# Patient Record
Sex: Female | Born: 1935 | Race: White | Hispanic: No | Smoking: Never smoker
Health system: Southern US, Community
[De-identification: ages and names within clinical notes are randomized; demographics above are authoritative.]

## PROBLEM LIST (undated history)

## (undated) DIAGNOSIS — N052 Unspecified nephritic syndrome with diffuse membranous glomerulonephritis: Secondary | ICD-10-CM

## (undated) DIAGNOSIS — M329 Systemic lupus erythematosus, unspecified: Secondary | ICD-10-CM

## (undated) DIAGNOSIS — K219 Gastro-esophageal reflux disease without esophagitis: Secondary | ICD-10-CM

## (undated) DIAGNOSIS — R1032 Left lower quadrant pain: Secondary | ICD-10-CM

## (undated) DIAGNOSIS — D279 Benign neoplasm of unspecified ovary: Secondary | ICD-10-CM

## (undated) DIAGNOSIS — R51 Headache: Secondary | ICD-10-CM

## (undated) DIAGNOSIS — G9341 Metabolic encephalopathy: Secondary | ICD-10-CM

## (undated) DIAGNOSIS — N736 Female pelvic peritoneal adhesions (postinfective): Secondary | ICD-10-CM

## (undated) DIAGNOSIS — G309 Alzheimer's disease, unspecified: Secondary | ICD-10-CM

## (undated) DIAGNOSIS — M3500 Sicca syndrome, unspecified: Secondary | ICD-10-CM

## (undated) DIAGNOSIS — N7011 Chronic salpingitis: Secondary | ICD-10-CM

## (undated) DIAGNOSIS — E7800 Pure hypercholesterolemia, unspecified: Secondary | ICD-10-CM

## (undated) DIAGNOSIS — K5909 Other constipation: Secondary | ICD-10-CM

## (undated) DIAGNOSIS — IMO0002 Reserved for concepts with insufficient information to code with codable children: Secondary | ICD-10-CM

## (undated) DIAGNOSIS — I639 Cerebral infarction, unspecified: Secondary | ICD-10-CM

## (undated) DIAGNOSIS — G8929 Other chronic pain: Secondary | ICD-10-CM

## (undated) DIAGNOSIS — I10 Essential (primary) hypertension: Secondary | ICD-10-CM

## (undated) HISTORY — DX: Unspecified nephritic syndrome with diffuse membranous glomerulonephritis: N05.2

## (undated) HISTORY — PX: FRACTURE SURGERY: SHX138

## (undated) HISTORY — DX: Other chronic pain: G89.29

## (undated) HISTORY — DX: Sjogren syndrome, unspecified: M35.00

## (undated) HISTORY — DX: Essential (primary) hypertension: I10

## (undated) HISTORY — DX: Chronic salpingitis: N70.11

## (undated) HISTORY — DX: Headache: R51

## (undated) HISTORY — DX: Other constipation: K59.09

## (undated) HISTORY — DX: Pure hypercholesterolemia, unspecified: E78.00

## (undated) HISTORY — DX: Benign neoplasm of unspecified ovary: D27.9

## (undated) HISTORY — DX: Left lower quadrant pain: R10.32

## (undated) HISTORY — DX: Female pelvic peritoneal adhesions (postinfective): N73.6

## (undated) HISTORY — DX: Gastro-esophageal reflux disease without esophagitis: K21.9

## (undated) HISTORY — DX: Metabolic encephalopathy: G93.41

---
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
## 1979-04-29 HISTORY — PX: VAGINAL HYSTERECTOMY: SUR661

## 1999-04-20 ENCOUNTER — Emergency Department (HOSPITAL_COMMUNITY): Admission: EM | Admit: 1999-04-20 | Discharge: 1999-04-20 | Payer: Self-pay | Admitting: Emergency Medicine

## 1999-04-20 ENCOUNTER — Encounter: Payer: Self-pay | Admitting: Emergency Medicine

## 1999-05-17 ENCOUNTER — Ambulatory Visit (HOSPITAL_COMMUNITY): Admission: RE | Admit: 1999-05-17 | Discharge: 1999-05-17 | Payer: Self-pay | Admitting: Neurology

## 2000-04-28 HISTORY — PX: APPENDECTOMY: SHX54

## 2000-04-28 HISTORY — PX: LAPAROSCOPIC CHOLECYSTECTOMY: SUR755

## 2003-04-29 HISTORY — PX: TOTAL ABDOMINAL HYSTERECTOMY W/ BILATERAL SALPINGOOPHORECTOMY: SHX83

## 2006-12-09 ENCOUNTER — Encounter: Payer: Self-pay | Admitting: Cardiology

## 2007-06-24 ENCOUNTER — Encounter: Payer: Self-pay | Admitting: Cardiology

## 2008-01-26 ENCOUNTER — Encounter: Payer: Self-pay | Admitting: Cardiology

## 2008-03-22 ENCOUNTER — Encounter: Payer: Self-pay | Admitting: Cardiology

## 2008-04-18 ENCOUNTER — Ambulatory Visit: Payer: Self-pay | Admitting: Cardiology

## 2008-04-26 ENCOUNTER — Ambulatory Visit: Payer: Self-pay | Admitting: Cardiology

## 2008-04-26 ENCOUNTER — Encounter: Payer: Self-pay | Admitting: Cardiology

## 2008-04-26 DIAGNOSIS — H1030 Unspecified acute conjunctivitis, unspecified eye: Secondary | ICD-10-CM | POA: Insufficient documentation

## 2008-04-26 DIAGNOSIS — R9430 Abnormal result of cardiovascular function study, unspecified: Secondary | ICD-10-CM | POA: Insufficient documentation

## 2008-04-26 DIAGNOSIS — M3500 Sicca syndrome, unspecified: Secondary | ICD-10-CM | POA: Insufficient documentation

## 2008-04-26 DIAGNOSIS — R109 Unspecified abdominal pain: Secondary | ICD-10-CM | POA: Insufficient documentation

## 2008-04-26 DIAGNOSIS — N052 Unspecified nephritic syndrome with diffuse membranous glomerulonephritis: Secondary | ICD-10-CM | POA: Insufficient documentation

## 2008-05-26 ENCOUNTER — Ambulatory Visit: Payer: Self-pay | Admitting: Cardiology

## 2008-06-30 ENCOUNTER — Encounter: Payer: Self-pay | Admitting: Cardiology

## 2009-01-11 ENCOUNTER — Ambulatory Visit: Payer: Self-pay | Admitting: Cardiology

## 2009-09-19 ENCOUNTER — Ambulatory Visit: Payer: Self-pay | Admitting: Cardiology

## 2010-02-26 ENCOUNTER — Encounter: Payer: Self-pay | Admitting: Physician Assistant

## 2010-03-05 ENCOUNTER — Encounter: Payer: Self-pay | Admitting: Physician Assistant

## 2010-03-18 ENCOUNTER — Ambulatory Visit: Payer: Self-pay | Admitting: Physician Assistant

## 2010-03-18 DIAGNOSIS — R079 Chest pain, unspecified: Secondary | ICD-10-CM

## 2010-03-26 ENCOUNTER — Ambulatory Visit: Payer: Self-pay | Admitting: Cardiology

## 2010-03-26 ENCOUNTER — Encounter: Payer: Self-pay | Admitting: Physician Assistant

## 2010-03-29 ENCOUNTER — Encounter (INDEPENDENT_AMBULATORY_CARE_PROVIDER_SITE_OTHER): Payer: Self-pay | Admitting: *Deleted

## 2010-04-19 ENCOUNTER — Encounter: Payer: Self-pay | Admitting: Cardiology

## 2010-04-19 ENCOUNTER — Ambulatory Visit: Payer: Self-pay | Admitting: Cardiology

## 2010-05-28 NOTE — Assessment & Plan Note (Signed)
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 She stable from a renal perspective. She reportedly had some lower extremity edema after started on proper meal which has been discontinued. The patient has been given hydrochlorothiazide by Dr. Neita Carp in her blood pressure is well controlled. She does show me some cystic lesions, fluid-filled at the level of both ankles at the outer aspect. Particularly present in the left ankle  The patient also stress test 2 years ago with positive ST segment changes but no echocardiographic imaging changes. The patient has done well from a cardiac standpoint she denies any chest pain she does have old shortness of breath on exertion but she attributes this to deconditioning. She does feel tired and fatigued most days but it does appear that she goes quite late to bed and has an interrupted sleep pattern. However she has no symptoms of heart failure or ischemia.  Preventive Screening-Counseling & Management  Alcohol-Tobacco     Smoking Status: never  Current Medications (verified): 1)  Vitamin B-12 1000 Mcg Tabs (Cyanocobalamin) .... Take 1 Tablet By Mouth Once A Day 2)  Vitamin D3 2000 Unit Caps (Cholecalciferol) .... Take 1 Capsule By Mouth Once A Day 3)  Miralax  Powd (Polyethylene Glycol 3350) .Marland KitchenMarland KitchenMarland Kitchen 17 Grams Daily 4)  Red Yeast Rice 600 Mg Caps (Red Yeast Rice Extract) .... Take 1 Capsule By Mouth Once A Day 5)  Fish Oil Double Strength 1200 Mg Caps (Omega-3 Fatty Acids) .... Take 2 Capsule By Mouth Once A Day (Occasionally Doesn't Take) 6)  Aspirin 81 Mg Tbec (Aspirin) .... Take One Tablet By Mouth Daily 7)  Nitrostat 0.4 Mg Subl (Nitroglycerin) .... Take As Directed 8)  Vitamin C 500 Mg Tabs (Ascorbic Acid) .... Take 1 Tablet By Mouth Once A Day 9)  Centrum Silver   Tabs (Multiple Vitamins-Minerals) .... Take 1 Tablet By Mouth Once A Day 10)  Biotin 5000 5 Mg Caps (Biotin) .... Take 1 Tablet By Mouth Once A Day 11)  Restasis 0.05 % Emul (Cyclosporine) .... Two Times A Day 12)  Eq Effervescent Cold Plus 2-5-250 Mg Tbef (Chlorphen-Phenyleph-Apap) .... As Needed 13)  Zinc 50 Mg Tabs (Zinc) .... Take 1 Tablet By Mouth Once A Day 14)  L-Lysine 1000 Mg Tabs (Lysine) .... Take 1 Tablet By Mouth Once A Day 15)  Omeprazole 20 Mg Cpdr (Omeprazole) .... Take 1 Tablet By Mouth Once A Day 16)  Hydrochlorothiazide 25 Mg Tabs (Hydrochlorothiazide) .... Take 1 Tablet By Mouth Once A Day  Allergies (verified): 1)  ! * Cellcept 2)  Penicillin 3)  Steroids 4)  Levaquin 5)  Sulfa  Comments:  Nurse/Medical Assistant: The patient's medications and allergies were reviewed with the patient and were updated in the Medication and Allergy Lists. List reviewed.   Past History:  Past Medical History: Last updated: 04/26/2008 chronic left lower quadrant pain Left hydrosalpinx Right ovarian fibroma Pelvic adhesions Chronic constipation Membranous glomerulonephritisfollowed by Dr. Clelia Schaumann Sjogren syndrome  Past Surgical History: Last updated: 04/25/2008 bilateral salpingo-oophorectomy in 2005 Appendectomy 2002 Laparoscopic cholecystectomy 2002 Tonsillectomy in 1941 Vaginal hysterectomy 1981  Family History: Last updated: 04/25/2008 Heart disease, anxiety and depression.  Social History: Last updated: 04/26/2008 the patient denies any tobacco or alcohol use  Review of Systems       The patient  complains of fatigue and weight gain/loss.  The patient denies malaise, fever, vision loss, decreased hearing, hoarseness, chest pain, palpitations, shortness of breath, prolonged cough, wheezing, sleep apnea, coughing up blood, abdominal pain, blood in stool, nausea, vomiting, diarrhea, heartburn, incontinence, blood in urine, muscle weakness, joint pain, leg  swelling, rash, skin lesions, headache, fainting, dizziness, depression, anxiety, enlarged lymph nodes, easy bruising or bleeding, and environmental allergies.    Vital Signs:  Patient profile:   77 year old female Height:      62 inches Weight:      148 pounds Pulse rate:   63 / minute BP sitting:   115 / 74  (left arm) Cuff size:   regular  Vitals Entered By: Carlye Grippe (Sep 19, 2009 9:10 AM)  Physical Exam  Additional Exam:  General: Well-developed, well-nourished in no distress head: Normocephalic and atraumatic eyes PERRLA/EOMI intact, conjunctiva and lids normal nose: No deformity or lesions mouth normal dentition, normal posterior pharynx neck: Supple, no JVD.  No masses, thyromegaly or abnormal cervical nodes lungs: Normal breath sounds bilaterally without wheezing.  Normal percussion heart: regular rate and rhythm with normal S1 and S2, no S3 or S4.  PMI is normal.  No pathological murmurs abdomen: Normal bowel sounds, abdomen is soft and nontender without masses, organomegaly or hernias noted.  No hepatosplenomegaly musculoskeletal: Back normal, normal gait muscle strength and tone normal pulsus: Pulse is normal in all 4 extremities Extremities: No peripheral pitting edema, small cystic lesions on the outer aspect of both ankles neurologic: Alert and oriented x 3 skin: Intact without lesions or rashes cervical nodes: No significant adenopathy psychologic: Normal affect    Impression & Recommendations:  Problem # 1:  MEMBRANOUS GLOMERULONEPHRITIS (ICD-583.1) in remission  Problem # 2:  NONSPECIFIC ABNORMAL UNSPEC CV FUNCTION STUDY (ICD-794.30) negative echocardiographic images. No clear evidence that the patient has significant ischemic heart disease and no further testing required currently. Continue risk factor modification  Problem # 3:  SJOGREN'S SYNDROME (ICD-710.2) Assessment: Comment Only  Patient Instructions: 1)  Your physician recommends that you  continue on your current medications as directed. Please refer to the Current Medication list given to you today. 2)  Follow up in  1 year. Prescriptions: NITROSTAT 0.4 MG SUBL (NITROGLYCERIN) dissolve one tablet under tongue for severe chest pain as needed every 5 minutes, not to exceed 3 in 15 min time frame  #25 x 2   Entered by:   Hoover Brunette, LPN   Authorized by:   Lewayne Bunting, MD, [facility]   Signed by:   Hoover Brunette, LPN on 84/69/6295   Method used:   Electronically to        [facility] (retail)       [address]       [place], [place]  [address]       Ph: [phone]       Fax: [phone]   RxID:   [id number]

## 2010-05-28 NOTE — Letter (Signed)
Summary: Engineer, materials at [facility]  [address]   [place], [place] [address]   Phone: [phone]  Fax: [phone]        March 29, 2010 MRN: [id number]   LAIYA WISBY [address] [place], [place]  [address]   Dear Ms. Val Eagle,  Your test ordered by Selena Batten has been reviewed by your physician (or physician assistant) and was found to be normal or stable. Your physician (or physician assistant) felt no changes were needed at this time.  _X___ Echocardiogram  ____ Cardiac Stress Test  ____ Lab Work  ____ Peripheral vascular study of arms, legs or neck  ____ CT scan or X-ray  ____ Lung or Breathing test  ____ Other:   Thank you.   Hoover Brunette, LPN    Duane Boston, M.D., F.A.C.C. Thressa Sheller, M.D., F.A.C.C. Oneal Grout, M.D., F.A.C.C. Cheree Ditto, M.D., F.A.C.C. Daiva Nakayama, M.D., F.A.C.C. Kenney Houseman, M.D., F.A.C.C. Jeanne Ivan, PA-C

## 2010-05-28 NOTE — Letter (Signed)
Summary: External Correspondence/ [facility] DR. Neita Carp  External Correspondence/ [facility] DR. Neita Carp   Imported By: Dorise Hiss 03/07/2010 15:24:52  _____________________________________________________________________  External Attachment:    Type:   Image     Comment:   External Document

## 2010-05-28 NOTE — Assessment & Plan Note (Signed)
Summary: ROV- RECURRENT CP PER SASSER REQUEST   Visit Type:  Follow-up Primary Provider:  Dr Neita Carp   History of Present Illness: patient presents for evaluation of chest pain, per Dr. Neita Carp.   Patient has no documented history of CAD. She had a probable false positive exercise echocardiogram, 12/09, notable for positive ST segment changes, but no echocardiographic imaging changes. EF 55-60%. She was last seen here in the clinic, by Dr. Andee Lineman, in May of this year.  Patient denies any interim development of exertional angina pectoris, or significant DOE. Her chest pain occurs only when lying supine and taking a deep breath. It was particularly bad 2 weeks ago, but absence of associated fever, chills, or cough. Symptoms have abated, but still persist, again with deep inspiration.  A recent 12-lead EKG in Dr. Dian Situ office indicated no acute changes. This was also compared to her last study here in our office, 9/10, again with no significant changes.  Preventive Screening-Counseling & Management  Alcohol-Tobacco     Smoking Status: never  Current Medications (verified): 1)  Vitamin B-12 1000 Mcg Tabs (Cyanocobalamin) .... Take 1 Tablet By Mouth Once A Day 2)  Vitamin D3 2000 Unit Caps (Cholecalciferol) .... Take 1 Capsule By Mouth Once A Day 3)  Miralax  Powd (Polyethylene Glycol 3350) .Marland KitchenMarland KitchenMarland Kitchen 17 Grams Daily 4)  Red Yeast Rice 600 Mg Caps (Red Yeast Rice Extract) .... Take 1 Capsule By Mouth Once A Day 5)  Fish Oil Double Strength 1200 Mg Caps (Omega-3 Fatty Acids) .... Take 2 Capsule By Mouth Once A Day (Occasionally Doesn't Take) 6)  Aspirin 81 Mg Tbec (Aspirin) .... Take One Tablet By Mouth Daily 7)  Nitrostat 0.4 Mg Subl (Nitroglycerin) .... Dissolve One Tablet Under Tongue For Severe Chest Pain As Needed Every 5 Minutes, Not To Exceed 3 in 15 Min Time Frame 8)  Vitamin C 500 Mg Tabs (Ascorbic Acid) .... Take 1 Tablet By Mouth Once A Day 9)  Centrum Silver  Tabs (Multiple  Vitamins-Minerals) .... Take 1 Tablet By Mouth Once A Day 10)  Biotin 5000 5 Mg Caps (Biotin) .... Take 1 Tablet By Mouth Once A Day 11)  Restasis 0.05 % Emul (Cyclosporine) .... Two Times A Day 12)  Eq Effervescent Cold Plus 2-5-250 Mg Tbef (Chlorphen-Phenyleph-Apap) .... As Needed 13)  Zinc 50 Mg Tabs (Zinc) .... Take 1 Tablet By Mouth Once A Day 14)  L-Lysine 1000 Mg Tabs (Lysine) .... Take 1 Tablet By Mouth Once A Day 15)  Pantoprazole Sodium 40 Mg Tbec (Pantoprazole Sodium) .... Take 1 Tablet By Mouth Once A Day 16)  Hydrochlorothiazide 25 Mg Tabs (Hydrochlorothiazide) .... Take 1 Tablet By Mouth Once A Day  Allergies (verified): 1)  ! * Cellcept 2)  Penicillin 3)  Steroids 4)  Levaquin 5)  Sulfa  Comments:  Nurse/Medical Assistant: The patient's medication list and allergies were reviewed with the patient and were updated in the Medication and Allergy Lists.  Past History:  Past Medical History: Last updated: 04/26/2008 chronic left lower quadrant pain Left hydrosalpinx Right ovarian fibroma Pelvic adhesions Chronic constipation Membranous glomerulonephritisfollowed by Dr. Clelia Schaumann Sjogren syndrome  Review of Systems       No fevers, chills, hemoptysis, dysphagia, melena, hematocheezia, hematuria, rash, claudication, orthopnea, pnd, pedal edema. All other systems negative.   Vital Signs:  Patient profile:   77 year old female Height:      62 inches Weight:      143 pounds BMI:  26.25 O2 Sat:      96 % on Room air Pulse rate:   61 / minute BP sitting:   116 / 76  (left arm) Cuff size:   regular  Vitals Entered By: Carlye Grippe (March 18, 2010 1:15 PM)  Nutrition Counseling: Patient's BMI is greater than 25 and therefore counseled on weight management options.  O2 Flow:  Room air   Physical Exam  Additional Exam:  GEN: 77 year old female, sitting upright, in no distress HEENT: NCAT,PERRLA,EOMI NECK: palpable pulses, no bruits; no JVD; no  TM LUNGS: CTA bilaterally HEART: RRR (S1S2); no significant murmurs; no rubs; no gallops ABD: soft, NT; intact BS EXT: intact distal pulses; no edema SKIN: warm, dry MUSC: no obvious deformity NEURO: A/O (x3)     Impression & Recommendations:  Problem # 1:  CHEST PAIN (ICD-786.50)  patient's symptoms are pleuritic in nature, and not suggestive of underlying ischemic heart disease. Therefore, do not recommend a repeat stress test, particularly in light of a previous probable false positive stress echocardiogram in 2009. However, we'll order a 2-D echocardiogram to rule out structural abnormalities, or possible pericardial effusion. If this is negative, then her etiology is most likely musculoskeletal. We'll plan early return clinic follow up, with Dr. Andee Lineman, for review of echo results, and further recommendations.  Problem # 2:  NONSPECIFIC ABNORMAL UNSPEC CV FUNCTION STUDY (ICD-794.30) Assessment: Comment Only  Problem # 3:  SJOGREN'S SYNDROME (ICD-710.2) Assessment: Comment Only  Other Orders: 2-D Echocardiogram (2D Echo)  Patient Instructions: 1)  Follow up with Dr. Earnestine Leys on Friday, April 19, 2010 at 10:30am. 2)  Your physician has requested that you have an echocardiogram.  Echocardiography is a painless test that uses sound waves to create images of your heart. It provides your doctor with information about the size and shape of your heart and how well your heart's chambers and valves are working.  This procedure takes approximately one hour. There are no restrictions for this procedure. 3)  Your physician recommends that you continue on your current medications as directed. Please refer to the Current Medication list given to you today.

## 2010-05-30 NOTE — Assessment & Plan Note (Signed)
Summary: 1 MO F/U PER 11/21 OV W/GENE-JM   Visit Type:  Follow-up Primary Provider:  Dr Neita Carp   History of Present Illness: the patient is a 77 year old female with no documented history of coronary artery disease.  She had a probable false-positive exercise echocardiogram with ST segment changes but normal echocardiographic images.  Ejection fraction 55 to 60%.  The patient also has a history glomerulonephritis and has been followed in [facility].  This has been stable.  Chest recent blood work done with a normal creatinine with an elevated calcium level.  Her level was 10.8-mg percent.  She also had a recent echocardiogram done on November 2011 which was essentially within normal limits.  The patient does report occasional chest tightness but no exertional chest pain.  She denies any orthopnea PND palpitations of syncope.  Preventive Screening-Counseling & Management  Alcohol-Tobacco     Smoking Status: never  Current Medications (verified): 1)  Vitamin B-12 1000 Mcg Tabs (Cyanocobalamin) .... Take 1 Tablet By Mouth Once A Day 2)  Vitamin D3 2000 Unit Caps (Cholecalciferol) .... Take 1 Capsule By Mouth Once A Day 3)  Miralax  Powd (Polyethylene Glycol 3350) .Marland KitchenMarland KitchenMarland Kitchen 17 Grams Daily 4)  Red Yeast Rice 600 Mg Caps (Red Yeast Rice Extract) .... Take 1 Capsule By Mouth Once A Day 5)  Fish Oil Double Strength 1200 Mg Caps (Omega-3 Fatty Acids) .... Take 2 Capsule By Mouth Once A Day (Occasionally Doesn't Take) 6)  Aspirin 81 Mg Tbec (Aspirin) .... Take One Tablet By Mouth Daily 7)  Nitrostat 0.4 Mg Subl (Nitroglycerin) .... Dissolve One Tablet Under Tongue For Severe Chest Pain As Needed Every 5 Minutes, Not To Exceed 3 in 15 Min Time Frame 8)  Centrum Silver  Tabs (Multiple Vitamins-Minerals) .... Take 1 Tablet By Mouth Once A Day 9)  Biotin 5000 5 Mg Caps (Biotin) .... Take 1 Tablet By Mouth Once A Day 10)  Restasis 0.05 % Emul (Cyclosporine) .... Two Times A Day 11)  Eq Effervescent Cold Plus  2-5-250 Mg Tbef (Chlorphen-Phenyleph-Apap) .... As Needed 12)  Zinc 50 Mg Tabs (Zinc) .... Take 1 Tablet By Mouth Once A Day 13)  L-Lysine 1000 Mg Tabs (Lysine) .... Take 1 Tablet By Mouth Once A Day 14)  Pantoprazole Sodium 40 Mg Tbec (Pantoprazole Sodium) .... Take 1 Tablet By Mouth Once A Day 15)  Hydrochlorothiazide 25 Mg Tabs (Hydrochlorothiazide) .... Take 1 Tablet By Mouth Once A Day  Allergies: 1)  ! * Cellcept 2)  Penicillin 3)  Steroids 4)  Levaquin 5)  Sulfa  Comments:  Nurse/Medical Assistant: The patient's medications and allergies were reviewed with the patient and were updated in the Medication and Allergy Lists. Pt brought a list of medications to office visit.  Cyril Loosen, RN, BSN (April 19, 2010 10:47 AM)  Past History:  Past Medical History: Last updated: 04/26/2008 chronic left lower quadrant pain Left hydrosalpinx Right ovarian fibroma Pelvic adhesions Chronic constipation Membranous glomerulonephritisfollowed by Dr. Clelia Schaumann Sjogren syndrome  Past Surgical History: Last updated: 04/25/2008 bilateral salpingo-oophorectomy in 2005 Appendectomy 2002 Laparoscopic cholecystectomy 2002 Tonsillectomy in 1941 Vaginal hysterectomy 1981  Family History: Last updated: 04/25/2008 Heart disease, anxiety and depression.  Social History: Last updated: 04/26/2008 the patient denies any tobacco or alcohol use  Risk Factors: Smoking Status: never (04/19/2010)  Review of Systems  The patient denies fatigue, malaise, fever, weight gain/loss, vision loss, decreased hearing, hoarseness, chest pain, palpitations, shortness of breath, prolonged cough, wheezing, sleep apnea, coughing up  blood, abdominal pain, blood in stool, nausea, vomiting, diarrhea, heartburn, incontinence, blood in urine, muscle weakness, joint pain, leg swelling, rash, skin lesions, headache, fainting, dizziness, depression, anxiety, enlarged lymph nodes, easy bruising or bleeding, and  environmental allergies.    Vital Signs:  Patient profile:   77 year old female Height:      62 inches Weight:      143.25 pounds Pulse rate:   64 / minute BP sitting:   141 / 86  (left arm) Cuff size:   regular  Vitals Entered By: Cyril Loosen, RN, BSN (April 19, 2010 10:44 AM) Comments Follow up office visit   Physical Exam  Additional Exam:  GEN: 77 year old female, sitting upright, in no distress HEENT: NCAT,PERRLA,EOMI NECK: palpable pulses, no bruits; no JVD; no TM LUNGS: CTA bilaterally HEART: RRR (S1S2); no significant murmurs; no rubs; no gallops ABD: soft, NT; intact BS EXT: intact distal pulses; no edema SKIN: warm, dry MUSC: no obvious deformity NEURO: A/O (x3)     Impression & Recommendations:  Problem # 1:  MEMBRANOUS GLOMERULONEPHRITIS (ICD-583.1) followed at [facility] in [place] creatinine stable  Problem # 2:  CHEST PAIN (ICD-786.50) atypical chest pain.  Status-post stress echocardiogram however if the patient has ongoing chest pain the Cardiolite study should be considered. Her updated medication list for this problem includes:    Aspirin 81 Mg Tbec (Aspirin) .[name]... Take one tablet by mouth daily    Nitrostat 0.4 Mg Subl (Nitroglycerin) .[name]... Dissolve one tablet under tongue for severe chest pain as needed every 5 minutes, not to exceed 3 in 15 min time frame  Orders: T-Calcium  ([id number]) T- * Misc. Laboratory test [phone])  Problem # 3:  HYPERCALCEMIA (ICD-275.42) will obtain a PTH level as well as repeat calcium level Orders: T-Calcium  ([id number]) T- * Misc. Laboratory test [phone])  Patient Instructions: 1)  Labs:  Calcium, PTH level 2)  Follow up in  6 months Prescriptions: NITROSTAT 0.4 MG SUBL (NITROGLYCERIN) dissolve one tablet under tongue for severe chest pain as needed every 5 minutes, not to exceed 3 in 15 min time frame  #25 x 2   Entered by:   Hoover Brunette, LPN   Authorized by:   Lewayne Bunting, MD, [facility]   Signed by:    Hoover Brunette, LPN on 57/84/6962   Method used:   Electronically to        [facility] (retail)       [address]       [place], [place]  [address]       Ph: [phone]       Fax: [phone]   RxID:   [id number]

## 2010-11-28 ENCOUNTER — Encounter: Payer: Self-pay | Admitting: Cardiology

## 2010-12-13 ENCOUNTER — Encounter: Payer: Self-pay | Admitting: Cardiology

## 2010-12-13 ENCOUNTER — Ambulatory Visit (INDEPENDENT_AMBULATORY_CARE_PROVIDER_SITE_OTHER): Payer: Medicare Other | Admitting: Cardiology

## 2010-12-13 VITALS — BP 114/72 | HR 71 | Ht 62.0 in | Wt 137.0 lb

## 2010-12-13 DIAGNOSIS — R9430 Abnormal result of cardiovascular function study, unspecified: Secondary | ICD-10-CM

## 2010-12-13 DIAGNOSIS — N052 Unspecified nephritic syndrome with diffuse membranous glomerulonephritis: Secondary | ICD-10-CM

## 2010-12-13 DIAGNOSIS — R072 Precordial pain: Secondary | ICD-10-CM

## 2010-12-13 NOTE — Patient Instructions (Signed)
   D-Dimer - at hospital now  Please call office around 4:30 for results.  Your physician wants you to follow up in:  1 year.  You will receive a reminder letter in the mail one-two months in advance.  If you don't receive a letter, please call our office to schedule the follow up appointment

## 2010-12-13 NOTE — Progress Notes (Signed)
HPI   Allergies  Allergen Reactions  . Levofloxacin     REACTION: rash  . Medrol (Methylprednisolone)   . Mycophenolate Mofetil   . Penicillins     REACTION: itching  . Sulfonamide Derivatives     REACTION: nausea  . Verapamil     Felt shakey    Current Outpatient Prescriptions on File Prior to Visit  Medication Sig Dispense Refill  . cycloSPORINE (RESTASIS) 0.05 % ophthalmic emulsion 1 drop 2 (two) times daily.        . hydrochlorothiazide 25 MG tablet Take 25 mg by mouth daily.        . Multiple Vitamins-Minerals (CENTRUM SILVER PO) Take 1 tablet by mouth daily.        . nitroGLYCERIN (NITROSTAT) 0.4 MG SL tablet Place 0.4 mg under the tongue every 5 (five) minutes as needed.        . pantoprazole (PROTONIX) 40 MG tablet Take 40 mg by mouth daily.        . polyethylene glycol powder (MIRALAX) powder Take 17 g by mouth daily.        . vitamin B-12 (CYANOCOBALAMIN) 1000 MCG tablet Take 1,000 mcg by mouth daily.        . Zinc 50 MG TABS Take 1 tablet by mouth daily.          Past Medical History  Diagnosis Date  . Chronic left lower quadrant pain   . Hydrosalpinx     LEFT  . Ovarian fibroma     RIGHT  . Pelvic adhesions   . Chronic constipation   . Membranous glomerulonephritis     followed by Dr. Clelia Schaumann  . Sjogren's syndrome     Past Surgical History  Procedure Date  . Total abdominal hysterectomy w/ bilateral salpingoophorectomy 2005  . Appendectomy 2002  . Laparoscopic cholecystectomy 2002  . Tonsillectomy 1941  . Vaginal hysterectomy 1981    Family History  Problem Relation Age of Onset  . Heart disease    . Anxiety disorder    . Depression      History   Social History  . Marital Status: Widowed    Spouse Name: N/A    Number of Children: N/A  . Years of Education: N/A   Occupational History  . Not on file.   Social History Main Topics  . Smoking status: Never Smoker   . Smokeless tobacco: Never Used  . Alcohol Use: No  . Drug Use: Not  on file  . Sexually Active: Not on file   Other Topics Concern  . Not on file   Social History Narrative  . No narrative on file   [id number] positives as outlined above. The remainder of the 18  point review of systems is negative  PHYSICAL EXAM BP 114/72  Pulse 71  Ht 5\' 2"  (1.575 m)  Wt 137 lb (62.143 kg)  BMI 25.06 kg/m2  General: Well-developed, well-nourished in no distress Head: Normocephalic and atraumatic Eyes:PERRLA/EOMI intact, conjunctiva and lids normal Ears: No deformity or lesions Mouth:normal dentition, normal posterior pharynx Neck: Supple, no JVD.  No masses, thyromegaly or abnormal cervical nodes Lungs: Normal breath sounds bilaterally without wheezing.  Normal percussion Cardiac: regular rate and rhythm with normal S1 and S2, no S3 or S4.  PMI is normal.  No pathological murmurs Abdomen: Normal bowel sounds, abdomen is soft and nontender without masses, organomegaly or hernias noted.  No hepatosplenomegaly MSK: Back normal, normal gait muscle strength and tone normal Vascular: Pulse  is normal in all 4 extremities Extremities: No peripheral pitting edema Neurologic: Alert and oriented x 3 Skin: Intact without lesions or rashes Lymphatics: No significant adenopathy Psychologic: Normal affect   ECG: Not available  ASSESSMENT AND PLAN

## 2010-12-13 NOTE — Assessment & Plan Note (Signed)
Chest pain is atypical with some pleuritic component but appears to be more likely related to the chest cavity, and possibly costochondritis. However despite the low pretest probability for pulmonary emboli I've referred the patient for a stat d-dimer level to make sure that she has no pulmonary emboli No further ischemia evaluation currently indicated.

## 2010-12-13 NOTE — Assessment & Plan Note (Signed)
Resolved on no medical therapy.

## 2010-12-13 NOTE — Assessment & Plan Note (Signed)
Followed by primary care physician. PTH levels previously ordered

## 2011-05-05 DIAGNOSIS — D485 Neoplasm of uncertain behavior of skin: Secondary | ICD-10-CM | POA: Diagnosis not present

## 2011-05-08 DIAGNOSIS — I10 Essential (primary) hypertension: Secondary | ICD-10-CM | POA: Diagnosis not present

## 2011-05-08 DIAGNOSIS — E7800 Pure hypercholesterolemia, unspecified: Secondary | ICD-10-CM | POA: Diagnosis not present

## 2011-05-28 DIAGNOSIS — K5909 Other constipation: Secondary | ICD-10-CM | POA: Diagnosis not present

## 2011-05-28 DIAGNOSIS — J309 Allergic rhinitis, unspecified: Secondary | ICD-10-CM | POA: Diagnosis not present

## 2011-05-28 DIAGNOSIS — M545 Low back pain: Secondary | ICD-10-CM | POA: Diagnosis not present

## 2011-05-28 DIAGNOSIS — E7800 Pure hypercholesterolemia, unspecified: Secondary | ICD-10-CM | POA: Diagnosis not present

## 2011-05-28 DIAGNOSIS — E213 Hyperparathyroidism, unspecified: Secondary | ICD-10-CM | POA: Diagnosis not present

## 2011-05-28 DIAGNOSIS — I10 Essential (primary) hypertension: Secondary | ICD-10-CM | POA: Diagnosis not present

## 2011-08-17 DIAGNOSIS — S61209A Unspecified open wound of unspecified finger without damage to nail, initial encounter: Secondary | ICD-10-CM | POA: Diagnosis not present

## 2011-08-17 DIAGNOSIS — I10 Essential (primary) hypertension: Secondary | ICD-10-CM | POA: Diagnosis not present

## 2011-08-28 DIAGNOSIS — Z4802 Encounter for removal of sutures: Secondary | ICD-10-CM | POA: Diagnosis not present

## 2011-09-01 DIAGNOSIS — Z4802 Encounter for removal of sutures: Secondary | ICD-10-CM | POA: Diagnosis not present

## 2011-09-02 DIAGNOSIS — I10 Essential (primary) hypertension: Secondary | ICD-10-CM | POA: Diagnosis not present

## 2011-09-02 DIAGNOSIS — Z4802 Encounter for removal of sutures: Secondary | ICD-10-CM | POA: Diagnosis not present

## 2011-09-02 DIAGNOSIS — Z09 Encounter for follow-up examination after completed treatment for conditions other than malignant neoplasm: Secondary | ICD-10-CM | POA: Diagnosis not present

## 2011-09-19 DIAGNOSIS — E213 Hyperparathyroidism, unspecified: Secondary | ICD-10-CM | POA: Diagnosis not present

## 2011-09-19 DIAGNOSIS — E7800 Pure hypercholesterolemia, unspecified: Secondary | ICD-10-CM | POA: Diagnosis not present

## 2011-09-19 DIAGNOSIS — M545 Low back pain: Secondary | ICD-10-CM | POA: Diagnosis not present

## 2011-09-25 DIAGNOSIS — M545 Low back pain: Secondary | ICD-10-CM | POA: Diagnosis not present

## 2011-09-25 DIAGNOSIS — J309 Allergic rhinitis, unspecified: Secondary | ICD-10-CM | POA: Diagnosis not present

## 2011-09-25 DIAGNOSIS — I10 Essential (primary) hypertension: Secondary | ICD-10-CM | POA: Diagnosis not present

## 2011-09-25 DIAGNOSIS — K5909 Other constipation: Secondary | ICD-10-CM | POA: Diagnosis not present

## 2011-09-25 DIAGNOSIS — E213 Hyperparathyroidism, unspecified: Secondary | ICD-10-CM | POA: Diagnosis not present

## 2011-09-25 DIAGNOSIS — E7800 Pure hypercholesterolemia, unspecified: Secondary | ICD-10-CM | POA: Diagnosis not present

## 2011-10-15 DIAGNOSIS — Z1231 Encounter for screening mammogram for malignant neoplasm of breast: Secondary | ICD-10-CM | POA: Diagnosis not present

## 2011-10-28 DIAGNOSIS — E7800 Pure hypercholesterolemia, unspecified: Secondary | ICD-10-CM | POA: Diagnosis not present

## 2011-10-28 DIAGNOSIS — I10 Essential (primary) hypertension: Secondary | ICD-10-CM | POA: Diagnosis not present

## 2011-11-06 DIAGNOSIS — S40029A Contusion of unspecified upper arm, initial encounter: Secondary | ICD-10-CM | POA: Diagnosis not present

## 2011-11-06 DIAGNOSIS — E213 Hyperparathyroidism, unspecified: Secondary | ICD-10-CM | POA: Diagnosis not present

## 2011-11-06 DIAGNOSIS — I10 Essential (primary) hypertension: Secondary | ICD-10-CM | POA: Diagnosis not present

## 2011-11-06 DIAGNOSIS — R1031 Right lower quadrant pain: Secondary | ICD-10-CM | POA: Diagnosis not present

## 2011-11-06 DIAGNOSIS — K5909 Other constipation: Secondary | ICD-10-CM | POA: Diagnosis not present

## 2011-11-06 DIAGNOSIS — E7800 Pure hypercholesterolemia, unspecified: Secondary | ICD-10-CM | POA: Diagnosis not present

## 2011-11-14 DIAGNOSIS — R1031 Right lower quadrant pain: Secondary | ICD-10-CM | POA: Diagnosis not present

## 2011-11-14 DIAGNOSIS — R109 Unspecified abdominal pain: Secondary | ICD-10-CM | POA: Diagnosis not present

## 2011-11-19 DIAGNOSIS — R229 Localized swelling, mass and lump, unspecified: Secondary | ICD-10-CM | POA: Diagnosis not present

## 2011-11-25 DIAGNOSIS — N6459 Other signs and symptoms in breast: Secondary | ICD-10-CM | POA: Diagnosis not present

## 2011-11-25 DIAGNOSIS — R222 Localized swelling, mass and lump, trunk: Secondary | ICD-10-CM | POA: Diagnosis not present

## 2011-12-16 ENCOUNTER — Encounter: Payer: Self-pay | Admitting: *Deleted

## 2011-12-16 ENCOUNTER — Other Ambulatory Visit: Payer: Self-pay | Admitting: Cardiology

## 2011-12-16 ENCOUNTER — Ambulatory Visit (INDEPENDENT_AMBULATORY_CARE_PROVIDER_SITE_OTHER): Payer: Medicare Other | Admitting: Cardiology

## 2011-12-16 ENCOUNTER — Encounter: Payer: Self-pay | Admitting: Cardiology

## 2011-12-16 VITALS — BP 118/77 | HR 53 | Ht 62.0 in | Wt 139.0 lb

## 2011-12-16 DIAGNOSIS — R9430 Abnormal result of cardiovascular function study, unspecified: Secondary | ICD-10-CM

## 2011-12-16 DIAGNOSIS — R079 Chest pain, unspecified: Secondary | ICD-10-CM | POA: Diagnosis not present

## 2011-12-16 DIAGNOSIS — R0602 Shortness of breath: Secondary | ICD-10-CM

## 2011-12-16 DIAGNOSIS — Z136 Encounter for screening for cardiovascular disorders: Secondary | ICD-10-CM

## 2011-12-16 DIAGNOSIS — N055 Unspecified nephritic syndrome with diffuse mesangiocapillary glomerulonephritis: Secondary | ICD-10-CM

## 2011-12-16 DIAGNOSIS — N052 Unspecified nephritic syndrome with diffuse membranous glomerulonephritis: Secondary | ICD-10-CM

## 2011-12-16 MED ORDER — NITROGLYCERIN 0.4 MG SL SUBL: 0.4000 mg | SUBLINGUAL_TABLET | SUBLINGUAL | Status: DC | PRN

## 2011-12-16 NOTE — Patient Instructions (Signed)
   Dobutamine Echo  Office will contact with results Your physician wants you to follow up in: 6 months.  You will receive a reminder letter in the mail one-two months in advance.  If you don't receive a letter, please call our office to schedule the follow up appointment

## 2011-12-23 DIAGNOSIS — R079 Chest pain, unspecified: Secondary | ICD-10-CM | POA: Diagnosis not present

## 2011-12-23 DIAGNOSIS — R0602 Shortness of breath: Secondary | ICD-10-CM | POA: Diagnosis not present

## 2012-01-04 NOTE — Assessment & Plan Note (Signed)
Patient will be scheduled for dobutamine echo.  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

## 2012-01-04 NOTE — Progress Notes (Signed)
Peggy Bottoms, MD, [facility] Board Certified in Adult Cardiovascular Medicine,Internal Medicine and Critical Care Medicine    CC:   followup patient with history of atypical chest pain and membranous GN                                                                               HPI:        Patient now reports increased symptoms of chest pain as well as shortness of breath on exertion.  She has used nitroglycerin with some relief.  Particular dyspnea on exertion has been most bothersome.  The patient never had a cardiac catheterization because of a membranous GN.  She has noticed a significant decrease in excess tolerance.  She denies any orthopnea PND or any other heart failure symptoms.  She denies any palpitations  Or presyncope or syncope.  PMH: reviewed and listed in Problem List in Electronic Records (and see below) Past Medical History  Diagnosis Date  . Chronic left lower quadrant pain   . Hydrosalpinx     LEFT  . Ovarian fibroma     RIGHT  . Pelvic adhesions   . Chronic constipation   . Membranous glomerulonephritis     followed by Dr. Clelia Schaumann  . Sjogren's syndrome    Past Surgical History  Procedure Date  . Total abdominal hysterectomy w/ bilateral salpingoophorectomy 2005  . Appendectomy 2002  . Laparoscopic cholecystectomy 2002  . Tonsillectomy 1941  . Vaginal hysterectomy 1981    Allergies/SH/FHX : available in Electronic Records for review  Allergies  Allergen Reactions  . Levofloxacin     REACTION: rash  . Medrol (Methylprednisolone)   . Mycophenolate Mofetil   . Penicillins     REACTION: itching  . Sulfonamide Derivatives     REACTION: nausea  . Topiramate   . Verapamil     Felt shakey   History   Social History  . Marital Status: Widowed    Spouse Name: N/A    Number of Children: N/A  . Years of Education: N/A   Occupational History  . Not on file.   Social History Main Topics  . Smoking status: Never Smoker   . Smokeless tobacco:  Never Used  . Alcohol Use: No  . Drug Use: Not on file  . Sexually Active: Not on file   Other Topics Concern  . Not on file   Social History Narrative  . No narrative on file   Family History  Problem Relation Age of Onset  . Heart disease    . Anxiety disorder    . Depression      Medications: Current Outpatient Prescriptions  Medication Sig Dispense Refill  . cycloSPORINE (RESTASIS) 0.05 % ophthalmic emulsion 1 drop 2 (two) times daily.        . folic acid (FOLVITE) 800 MCG tablet Take 400 mcg by mouth daily.      . hydrochlorothiazide 25 MG tablet Take 25 mg by mouth daily.        . nitroGLYCERIN (NITROSTAT) 0.4 MG SL tablet Place 1 tablet (0.4 mg total) under the tongue every 5 (five) minutes as needed.  25 tablet  3  . pantoprazole (  PROTONIX) 40 MG tablet Take 40 mg by mouth 2 (two) times daily.       . polyethylene glycol powder (MIRALAX) powder Take 17 g by mouth daily.        . potassium chloride (MICRO-K) 10 MEQ CR capsule Take 1 tablet by mouth Daily.      . ranitidine (ZANTAC) 150 MG tablet Take 1 tablet by mouth Twice daily.      . Thiamine HCl (VITAMIN B-1) 250 MG tablet Take 250 mg by mouth daily.      . Zinc 50 MG TABS Take 1 tablet by mouth daily.          ROS: No nausea or vomiting. No fever or chills.No melena or hematochezia.No bleeding.No claudication  Physical Exam: BP 118/77  Pulse 53  Ht 5\' 2"  (1.575 m)  Wt 139 lb (63.05 kg)  BMI 25.42 kg/m2 General:well-nourished white female in no distress Neck:normal carotid upstroke without carotid bruits.  No thyromegaly, thyroid Lungs:clear breath sounds bilaterally no wheezing Cardiac:regular rate and rhythm normal sinus 2, rubs or gallops Vascular:no edema.  Normal distal pulses Skin:warm and dry Physcologic:normal affect  12lead EXB:MWUXLK sinus rhythm no acute ischemic changes Limited bedside ECHO:N/A No images are attached to the encounter.   I reviewed and summarized the old records. I  reviewed ECG and prior blood work.  Assessment and Plan  MEMBRANOUS GLOMERULONEPHRITIS Stabilized previously followed by Dr. Caryn Section at [facility].  NONSPECIFIC ABNORMAL UNSPEC CV FUNCTION STUDY Patient will be scheduled for dobutamine echo.  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  Further evaluation with dobutamine echo.    Patient Active Problem List  Diagnosis  . CONJUNCTIVITIS, ACUTE  . MEMBRANOUS GLOMERULONEPHRITIS  . SJOGREN'S SYNDROME  . CHEST PAIN  . ABDOMINAL PAIN, CHRONIC  . NONSPECIFIC ABNORMAL UNSPEC CV FUNCTION STUDY  . HYPERCALCEMIA

## 2012-01-04 NOTE — Assessment & Plan Note (Signed)
Stabilized previously followed by Dr. Caryn Section at [facility].

## 2012-01-04 NOTE — Assessment & Plan Note (Signed)
Reports both chest pressure at rest and on exertion and shortness of breath on walking upstairs.  Further evaluation with dobutamine echo.

## 2012-01-05 ENCOUNTER — Encounter: Payer: Self-pay | Admitting: *Deleted

## 2012-01-05 DIAGNOSIS — D485 Neoplasm of uncertain behavior of skin: Secondary | ICD-10-CM | POA: Diagnosis not present

## 2012-01-05 DIAGNOSIS — L299 Pruritus, unspecified: Secondary | ICD-10-CM | POA: Diagnosis not present

## 2012-01-05 DIAGNOSIS — L570 Actinic keratosis: Secondary | ICD-10-CM | POA: Diagnosis not present

## 2012-01-08 ENCOUNTER — Telehealth: Payer: Self-pay | Admitting: Cardiology

## 2012-01-08 NOTE — Telephone Encounter (Signed)
Discussed below with patient.  States this has been a while ago & not sure if this is something she needs to continue.  Advised her that as long as she is keeping her regular scheduled follow ups with her PMD & Cardiologist, that should be optional.  Up to her if she feels like she wants to pay that amount or not.  She verbalized understanding.

## 2012-01-08 NOTE — Telephone Encounter (Signed)
Patient asking if she needs to have the [facility] that was set up through Dr. Margarita Mail recommendation.  She states they charge 179.00 each time and she is not sure this is something she needs.  Not sure who she wants to switch to at this time. Please call.  I am going to send the patient a list of the physicians in our office.

## 2012-01-13 DIAGNOSIS — L98499 Non-pressure chronic ulcer of skin of other sites with unspecified severity: Secondary | ICD-10-CM | POA: Diagnosis not present

## 2012-01-13 DIAGNOSIS — D485 Neoplasm of uncertain behavior of skin: Secondary | ICD-10-CM | POA: Diagnosis not present

## 2012-03-02 DIAGNOSIS — E782 Mixed hyperlipidemia: Secondary | ICD-10-CM | POA: Diagnosis not present

## 2012-03-02 DIAGNOSIS — E7800 Pure hypercholesterolemia, unspecified: Secondary | ICD-10-CM | POA: Diagnosis not present

## 2012-03-02 DIAGNOSIS — E213 Hyperparathyroidism, unspecified: Secondary | ICD-10-CM | POA: Diagnosis not present

## 2012-03-02 DIAGNOSIS — I10 Essential (primary) hypertension: Secondary | ICD-10-CM | POA: Diagnosis not present

## 2012-03-09 DIAGNOSIS — R5381 Other malaise: Secondary | ICD-10-CM | POA: Diagnosis not present

## 2012-03-09 DIAGNOSIS — E7800 Pure hypercholesterolemia, unspecified: Secondary | ICD-10-CM | POA: Diagnosis not present

## 2012-03-09 DIAGNOSIS — E213 Hyperparathyroidism, unspecified: Secondary | ICD-10-CM | POA: Diagnosis not present

## 2012-03-09 DIAGNOSIS — R5383 Other fatigue: Secondary | ICD-10-CM | POA: Diagnosis not present

## 2012-03-09 DIAGNOSIS — I10 Essential (primary) hypertension: Secondary | ICD-10-CM | POA: Diagnosis not present

## 2012-03-18 DIAGNOSIS — Z23 Encounter for immunization: Secondary | ICD-10-CM | POA: Diagnosis not present

## 2012-04-01 ENCOUNTER — Ambulatory Visit: Payer: Medicare Other | Admitting: Endocrinology

## 2012-04-06 DIAGNOSIS — H524 Presbyopia: Secondary | ICD-10-CM | POA: Diagnosis not present

## 2012-04-06 DIAGNOSIS — H04129 Dry eye syndrome of unspecified lacrimal gland: Secondary | ICD-10-CM | POA: Diagnosis not present

## 2012-04-06 DIAGNOSIS — H2510 Age-related nuclear cataract, unspecified eye: Secondary | ICD-10-CM | POA: Diagnosis not present

## 2012-04-08 ENCOUNTER — Ambulatory Visit: Payer: Medicare Other | Admitting: Endocrinology

## 2012-07-27 DIAGNOSIS — E7800 Pure hypercholesterolemia, unspecified: Secondary | ICD-10-CM | POA: Diagnosis not present

## 2012-07-27 DIAGNOSIS — E213 Hyperparathyroidism, unspecified: Secondary | ICD-10-CM | POA: Diagnosis not present

## 2012-07-27 DIAGNOSIS — I10 Essential (primary) hypertension: Secondary | ICD-10-CM | POA: Diagnosis not present

## 2012-08-03 DIAGNOSIS — R5383 Other fatigue: Secondary | ICD-10-CM | POA: Diagnosis not present

## 2012-08-03 DIAGNOSIS — E7800 Pure hypercholesterolemia, unspecified: Secondary | ICD-10-CM | POA: Diagnosis not present

## 2012-08-03 DIAGNOSIS — D72819 Decreased white blood cell count, unspecified: Secondary | ICD-10-CM | POA: Diagnosis not present

## 2012-08-03 DIAGNOSIS — E213 Hyperparathyroidism, unspecified: Secondary | ICD-10-CM | POA: Diagnosis not present

## 2012-08-03 DIAGNOSIS — I10 Essential (primary) hypertension: Secondary | ICD-10-CM | POA: Diagnosis not present

## 2012-08-03 DIAGNOSIS — R5381 Other malaise: Secondary | ICD-10-CM | POA: Diagnosis not present

## 2012-08-05 ENCOUNTER — Ambulatory Visit (INDEPENDENT_AMBULATORY_CARE_PROVIDER_SITE_OTHER): Payer: Medicare Other | Admitting: Endocrinology

## 2012-08-05 ENCOUNTER — Encounter: Payer: Self-pay | Admitting: Endocrinology

## 2012-08-05 VITALS — BP 128/80 | HR 78 | Wt 141.0 lb

## 2012-08-05 DIAGNOSIS — E210 Primary hyperparathyroidism: Secondary | ICD-10-CM

## 2012-08-05 NOTE — Progress Notes (Signed)
Subjective:    Patient ID: Peggy Ramirez, female    DOB: [birth year], 79 y.o.   MRN: [id number]  HPI Pt reports 8 years h/o mild primary hyperparathyroidism.  She has arthralgias of the hands, and assoc cold intolerance.  She has no h/o urolithiasis, bony fx, or osteoporosis.  Past Medical History  Diagnosis Date  . Chronic left lower quadrant pain   . Hydrosalpinx     LEFT  . Ovarian fibroma     RIGHT  . Pelvic adhesions   . Chronic constipation   . Membranous glomerulonephritis     followed by Dr. Clelia Schaumann  . Sjogren's syndrome     Past Surgical History  Procedure Laterality Date  . Total abdominal hysterectomy w/ bilateral salpingoophorectomy  2005  . Appendectomy  2002  . Laparoscopic cholecystectomy  2002  . Tonsillectomy  1941  . Vaginal hysterectomy  1981    History   Social History  . Marital Status: Widowed    Spouse Name: N/A    Number of Children: N/A  . Years of Education: N/A   Occupational History  . Not on file.   Social History Main Topics  . Smoking status: Never Smoker   . Smokeless tobacco: Never Used  . Alcohol Use: No  . Drug Use: Not on file  . Sexually Active: Not on file   Other Topics Concern  . Not on file   Social History Narrative  . No narrative on file    Current Outpatient Prescriptions on File Prior to Visit  Medication Sig Dispense Refill  . cycloSPORINE (RESTASIS) 0.05 % ophthalmic emulsion 1 drop 2 (two) times daily.        . folic acid (FOLVITE) 800 MCG tablet Take 400 mcg by mouth daily.      . hydrochlorothiazide 25 MG tablet Take 25 mg by mouth daily.        . nitroGLYCERIN (NITROSTAT) 0.4 MG SL tablet Place 1 tablet (0.4 mg total) under the tongue every 5 (five) minutes as needed.  25 tablet  3  . pantoprazole (PROTONIX) 40 MG tablet Take 40 mg by mouth 2 (two) times daily.       . polyethylene glycol powder (MIRALAX) powder Take 17 g by mouth daily.        . potassium chloride (MICRO-K) 10 MEQ CR capsule Take  1 tablet by mouth Daily.      . ranitidine (ZANTAC) 150 MG tablet Take 1 tablet by mouth Twice daily.      . Thiamine HCl (VITAMIN B-1) 250 MG tablet Take 250 mg by mouth daily.      . Zinc 50 MG TABS Take 1 tablet by mouth daily.         No current facility-administered medications on file prior to visit.    Allergies  Allergen Reactions  . Levofloxacin     REACTION: rash  . Medrol (Methylprednisolone)   . Mycophenolate Mofetil   . Penicillins     REACTION: itching  . Sulfonamide Derivatives     REACTION: nausea  . Topiramate   . Verapamil     Felt shakey    Family History  Problem Relation Age of Onset  . Heart disease    . Anxiety disorder    . Depression    neg for hypercalcemia  BP 128/80  Pulse 78  Wt 141 lb (63.957 kg)  BMI 25.78 kg/m2  SpO2 97%   Review of Systems denies weight loss, galactorrhea, hematuria, memory  loss, menopausal sxs, numbness, myalgias, abdominal pain, muscle weakness, skin rash, visual loss, sob, diarrhea, rhinorrhea, easy bruising, and depression.      Objective:   Physical Exam VS: see vs page. GEN: no distress. HEAD: head: no deformity. eyes: no periorbital swelling, no proptosis. external nose and ears are normal.   mouth: no lesion seen.  NECK: supple, thyroid is not enlarged.   CHEST WALL: no deformity. LUNGS:  Clear to auscultation. CV: reg rate and rhythm, no murmur. ABD: abdomen is soft, nontender.  no hepatosplenomegaly.  not distended.  no hernia.   MUSCULOSKELETAL: muscle bulk and strength are grossly normal.  no obvious joint swelling.  gait is normal and steady.   EXTEMITIES: no deformity.  no edema.   PULSES: dorsalis pedis intact bilat.  no carotid bruit.   NEURO:  cn 2-12 grossly intact.   readily moves all 4's.  sensation is intact to touch on all 4's.  SKIN:  Normal texture and temperature.  No rash or suspicious lesion is visible.   NODES:  None palpable at the neck.   PSYCH: alert, oriented x3.  Does not  appear anxious nor depressed.     outside test results are reviewed: PTH=75 Ca++=10.7    Assessment & Plan:  Primary hyperparathyroidism, mild Arthralgias.   Very unlikely related to primary PTH Cold intolerance, not related to primary PTH

## 2012-08-05 NOTE — Patient Instructions (Addendum)
You do not need any treatment for the parathyroid now.   Drinking plenty of fluids, especially in the summer, may help to keep the calcium level down.   You should have your bone-density tested, if you have not done so recently. reducing the hydrochlorothiazide may also help lower the calcium level.   Please return in 1 year.

## 2012-10-05 DIAGNOSIS — H00029 Hordeolum internum unspecified eye, unspecified eyelid: Secondary | ICD-10-CM | POA: Diagnosis not present

## 2012-11-17 DIAGNOSIS — H02409 Unspecified ptosis of unspecified eyelid: Secondary | ICD-10-CM | POA: Diagnosis not present

## 2012-11-26 DIAGNOSIS — E213 Hyperparathyroidism, unspecified: Secondary | ICD-10-CM | POA: Diagnosis not present

## 2012-11-26 DIAGNOSIS — I10 Essential (primary) hypertension: Secondary | ICD-10-CM | POA: Diagnosis not present

## 2012-11-26 DIAGNOSIS — R5383 Other fatigue: Secondary | ICD-10-CM | POA: Diagnosis not present

## 2012-11-26 DIAGNOSIS — E7800 Pure hypercholesterolemia, unspecified: Secondary | ICD-10-CM | POA: Diagnosis not present

## 2012-12-02 DIAGNOSIS — Z1231 Encounter for screening mammogram for malignant neoplasm of breast: Secondary | ICD-10-CM | POA: Diagnosis not present

## 2012-12-03 DIAGNOSIS — D72819 Decreased white blood cell count, unspecified: Secondary | ICD-10-CM | POA: Diagnosis not present

## 2012-12-03 DIAGNOSIS — E7800 Pure hypercholesterolemia, unspecified: Secondary | ICD-10-CM | POA: Diagnosis not present

## 2012-12-03 DIAGNOSIS — I10 Essential (primary) hypertension: Secondary | ICD-10-CM | POA: Diagnosis not present

## 2012-12-03 DIAGNOSIS — M545 Low back pain: Secondary | ICD-10-CM | POA: Diagnosis not present

## 2012-12-03 DIAGNOSIS — E213 Hyperparathyroidism, unspecified: Secondary | ICD-10-CM | POA: Diagnosis not present

## 2012-12-03 DIAGNOSIS — R5383 Other fatigue: Secondary | ICD-10-CM | POA: Diagnosis not present

## 2012-12-10 ENCOUNTER — Ambulatory Visit: Payer: Medicare Other | Admitting: Diagnostic Neuroimaging

## 2012-12-22 ENCOUNTER — Encounter: Payer: Self-pay | Admitting: Diagnostic Neuroimaging

## 2012-12-22 ENCOUNTER — Ambulatory Visit (INDEPENDENT_AMBULATORY_CARE_PROVIDER_SITE_OTHER): Payer: Medicare Other | Admitting: Diagnostic Neuroimaging

## 2012-12-22 VITALS — BP 164/87 | HR 66 | Ht 62.0 in | Wt 137.0 lb

## 2012-12-22 DIAGNOSIS — R5381 Other malaise: Secondary | ICD-10-CM

## 2012-12-22 DIAGNOSIS — H02409 Unspecified ptosis of unspecified eyelid: Secondary | ICD-10-CM

## 2012-12-22 DIAGNOSIS — R531 Weakness: Secondary | ICD-10-CM

## 2012-12-22 DIAGNOSIS — H02403 Unspecified ptosis of bilateral eyelids: Secondary | ICD-10-CM

## 2012-12-22 NOTE — Patient Instructions (Signed)
I will check lab testing. 

## 2012-12-22 NOTE — Progress Notes (Signed)
[facility]  PATIENT: Peggy Ramirez DOB: 1933/05/30  REFERRING CLINICIAN: C Kinney HISTORY FROM: patient and daughter REASON FOR VISIT: new consult   HISTORICAL  CHIEF COMPLAINT:  Chief Complaint  Patient presents with  . Neurologic Problem    NP# 7    HISTORY OF PRESENT ILLNESS:   79 year old female with history of Sjogren's disease, hyperparathyroidism, hypertension, hypercholesteremia, migraine, acid reflux disease, here for evaluation of possible mass contrast.  Patient has lifelong history of dry eyes and dry mouth, diagnosed with Sjogren's disease many years ago. She takes eyedrops for this.  Over past few months patient is noted eyelid heaviness especially when she first wakes up in the morning as well as in late into the evening. She denies any double vision, slurred speech, trouble breathing. She has some swallowing difficulty which she tends to ask reflux disease and dry mouth. Patient also notes some weakness in her legs especially after physical exertion. She has some difficulty with washing her hair and an and movements of her head. Patient went to eye doctor for this eyelid problem and he was concerned about myasthenia gravis and refer patient to me for further evaluation.  REVIEW OF SYSTEMS: Full 14 system review of systems performed and notable only for weight gain fatigue trouble swallowing rash constipation shortness of breath cough eye pain blurred vision joint pain cramps aching muscle allergy runny nose headache weakness difficulty swallowing and restless legs.  ALLERGIES: Allergies  Allergen Reactions  . Levofloxacin     REACTION: rash  . Medrol [Methylprednisolone]   . Mycophenolate Mofetil   . Penicillins     REACTION: itching  . Sulfonamide Derivatives     REACTION: nausea  . Topiramate   . Verapamil     Felt shakey    HOME MEDICATIONS: Outpatient Prescriptions Prior to Visit  Medication Sig Dispense Refill  .  cycloSPORINE (RESTASIS) 0.05 % ophthalmic emulsion 1 drop 2 (two) times daily.        . folic acid (FOLVITE) 800 MCG tablet Take 400 mcg by mouth daily.      . hydrochlorothiazide 25 MG tablet Take 25 mg by mouth daily.        . pantoprazole (PROTONIX) 40 MG tablet Take 40 mg by mouth 2 (two) times daily.       . polyethylene glycol powder (MIRALAX) powder Take 17 g by mouth daily.        . potassium chloride (MICRO-K) 10 MEQ CR capsule Take 1 tablet by mouth Daily.      . ranitidine (ZANTAC) 150 MG tablet Take 1 tablet by mouth Twice daily.      . Thiamine HCl (VITAMIN B-1) 250 MG tablet Take 250 mg by mouth daily.      . Zinc 50 MG TABS Take 1 tablet by mouth daily.        [name] aspirin 81 MG tablet Take 81 mg by mouth daily.      . Cholecalciferol (VITAMIN D-3) 1000 UNITS CAPS Take by mouth.      . nitroGLYCERIN (NITROSTAT) 0.4 MG SL tablet Place 1 tablet (0.4 mg total) under the tongue every 5 (five) minutes as needed.  25 tablet  3   No facility-administered medications prior to visit.    PAST MEDICAL HISTORY: Past Medical History  Diagnosis Date  . Chronic left lower quadrant pain   . Hydrosalpinx     LEFT  . Ovarian fibroma     RIGHT  . Pelvic adhesions   .  Chronic constipation   . Membranous glomerulonephritis     followed by Dr. Clelia Schaumann  . Sjogren's syndrome   . Hypertension   . High cholesterol   . Headache(784.0)     PAST SURGICAL HISTORY: Past Surgical History  Procedure Laterality Date  . Total abdominal hysterectomy w/ bilateral salpingoophorectomy  2005  . Appendectomy  2002  . Laparoscopic cholecystectomy  2002  . Tonsillectomy  1941  . Vaginal hysterectomy  1981    FAMILY HISTORY: Family History  Problem Relation Age of Onset  . Heart disease    . Anxiety disorder    . Depression      SOCIAL HISTORY:  History   Social History  . Marital Status: Widowed    Spouse Name: N/A    Number of Children: 4  . Years of Education: 12   Occupational  History  . retired    Social History Main Topics  . Smoking status: Never Smoker   . Smokeless tobacco: Never Used  . Alcohol Use: No  . Drug Use: Not on file  . Sexual Activity: Not on file   Other Topics Concern  . Not on file   Social History Narrative  . No narrative on file     PHYSICAL EXAM  Filed Vitals:   12/22/12 1031  BP: 164/87  Pulse: 66  Height: 5\' 2"  (1.575 m)  Weight: 137 lb (62.143 kg)    Not recorded    Body mass index is 25.05 kg/(m^2).  GENERAL EXAM: Patient is in no distress  CARDIOVASCULAR: Regular rate and rhythm, no murmurs, no carotid bruits  NEUROLOGIC: MENTAL STATUS: awake, alert, language fluent, comprehension intact, naming intact CRANIAL NERVE: pupils equal and reactive to light, visual fields full to confrontation, extraocular muscles intact, no nystagmus, facial sensation and strength symmetric, uvula midline, shoulder shrug symmetric, tongue midline. NO PTOSIS OR DIPLOPIA AFTER 1 MINUTE UPGAZE. SUBJECTIVE EYELID HEAVINESS AFTER 1 MINUTE UPGAZE.  MOTOR: normal bulk and tone, full strength in the BUE, BLE; AFTER SIT/STAND X 10, PATIENT DEVELOPED BILATERAL LOWER EXT WEAKNESS (HF 3, KE/KF 4, DF 4).  SENSORY: normal and symmetric to light touch, pinprick, temperature, vibration COORDINATION: finger-nose-finger, fine finger movements normal REFLEXES: BUE 1, RIGHT KNEE 1, LEFT KNEE TRACE, RIGHT ANKLE TRACE, LEFT ANKLE 1. GAIT/STATION: narrow based gait; able to walk on toes, heels and tandem; romberg is negative   DIAGNOSTIC DATA (LABS, IMAGING, TESTING) - I reviewed patient records, labs, notes, testing and imaging myself where available.  No results found for this basename: WBC, HGB, HCT, MCV, PLT   No results found for this basename: na, k, cl, co2, glucose, bun, creatinine, calcium, prot, albumin, ast, alt, alkphos, bilitot, gfrnonaa, gfraa   No results found for this basename: CHOL, HDL, LDLCALC, LDLDIRECT, TRIG, CHOLHDL   No  results found for this basename: HGBA1C   No results found for this basename: VITAMINB12   No results found for this basename: TSH       ASSESSMENT AND PLAN  79 y.o. year old female here with intermittent bilateral ptosis (not present on exam today) and fatigable leg weakness (observed on exam today).  Ddx: myasthenia gravis, lambert-eaton syndrome, myopathy, thyroid disease  PLAN: Orders Placed This Encounter  Procedures  . Acetylcholine Receptor, Binding  . CK  . Aldolase  . TSH   - May consider EMG/NCS (with repetitive stimulation), anti-MUSK and anti-VGCC antibodies in future.  Return in about 3 months (around 03/24/2013).    Suanne Marker, MD  12/22/2012, 11:40 AM Certified in Neurology, Neurophysiology and Neuroimaging  [facility] [address], [address] [place], [place] [address] [phone]

## 2012-12-23 LAB — ALDOLASE: Aldolase: 5.4 U/L (ref 1.2–7.6)

## 2012-12-23 LAB — CK: Total CK: 59 U/L (ref 24–173)

## 2012-12-28 DIAGNOSIS — R1031 Right lower quadrant pain: Secondary | ICD-10-CM | POA: Diagnosis not present

## 2013-01-03 DIAGNOSIS — L738 Other specified follicular disorders: Secondary | ICD-10-CM | POA: Diagnosis not present

## 2013-01-03 DIAGNOSIS — L570 Actinic keratosis: Secondary | ICD-10-CM | POA: Diagnosis not present

## 2013-01-10 DIAGNOSIS — IMO0001 Reserved for inherently not codable concepts without codable children: Secondary | ICD-10-CM | POA: Diagnosis not present

## 2013-01-10 DIAGNOSIS — M25559 Pain in unspecified hip: Secondary | ICD-10-CM | POA: Diagnosis not present

## 2013-01-14 ENCOUNTER — Ambulatory Visit (INDEPENDENT_AMBULATORY_CARE_PROVIDER_SITE_OTHER): Payer: Medicare Other | Admitting: Cardiovascular Disease

## 2013-01-14 ENCOUNTER — Ambulatory Visit: Payer: Medicare Other | Admitting: Cardiovascular Disease

## 2013-01-14 ENCOUNTER — Encounter: Payer: Self-pay | Admitting: Cardiovascular Disease

## 2013-01-14 VITALS — BP 115/70 | HR 70 | Ht 62.0 in | Wt 138.4 lb

## 2013-01-14 DIAGNOSIS — R079 Chest pain, unspecified: Secondary | ICD-10-CM | POA: Diagnosis not present

## 2013-01-14 DIAGNOSIS — Z136 Encounter for screening for cardiovascular disorders: Secondary | ICD-10-CM

## 2013-01-14 DIAGNOSIS — R0602 Shortness of breath: Secondary | ICD-10-CM

## 2013-01-14 DIAGNOSIS — IMO0001 Reserved for inherently not codable concepts without codable children: Secondary | ICD-10-CM

## 2013-01-14 NOTE — Progress Notes (Signed)
Patient ID: Peggy Ramirez, female   DOB: 07/03/1933, 79 y.o.   MRN: [id number]   SUBJECTIVE: Mrs. Arcos is a 79 year old female with a history of Sjogren's disease, membranous glomerulonephritis, hyperparathyroidism, hypertension, hypercholesteremia, migraine, and acid reflux disease. She is currently being evaluated by a neurologist for intermittent bilateral ptosis and leg weakness/fatigue.  She has a h/o chest pain and shortness of breath in the past, and underwent a normal dobutamine stress echocardiogram in 11/2011.  She occasionally has chest pain which she believes is related to GERD, and is on ranitidine. She continues to have shortness of breath.  Her husband passed away 6 years ago. She is active at her church [employer] [facility] [employer]).    Allergies  Allergen Reactions  . Levofloxacin     REACTION: rash  . Medrol [Methylprednisolone]   . Mycophenolate Mofetil   . Penicillins     REACTION: itching  . Sulfonamide Derivatives     REACTION: nausea  . Topiramate   . Verapamil     Felt shakey    Current Outpatient Prescriptions  Medication Sig Dispense Refill  . acetaminophen (TYLENOL) 325 MG tablet Take by mouth every 6 (six) hours as needed for pain.      . Biotin 10 MG CAPS Take 10 mg by mouth daily.      . cycloSPORINE (RESTASIS) 0.05 % ophthalmic emulsion 1 drop 2 (two) times daily.        [name] doxycycline (VIBRA-TABS) 100 MG tablet Take 100 mg by mouth daily.       . folic acid (FOLVITE) 800 MCG tablet Take 400 mcg by mouth daily.      . hydrochlorothiazide 25 MG tablet Take 25 mg by mouth daily.        [name] ibuprofen (ADVIL) 200 MG tablet Take 200 mg by mouth every 6 (six) hours as needed for pain.      . pantoprazole (PROTONIX) 40 MG tablet Take 40 mg by mouth 2 (two) times daily.       . polyethylene glycol powder (MIRALAX) powder Take 17 g by mouth daily.        . potassium chloride (MICRO-K) 10 MEQ CR capsule Take 1 tablet by mouth Daily.      [name] Propylene Glycol (SYSTANE  BALANCE OP) Apply to eye as needed.      . ranitidine (ZANTAC) 150 MG tablet Take 1 tablet by mouth Twice daily.      . Thiamine HCl (VITAMIN B-1) 250 MG tablet Take 250 mg by mouth daily.      . vitamin A 82956 UNIT capsule Take 10,000 Units by mouth daily.      . Zinc 50 MG TABS Take 1 tablet by mouth daily.         No current facility-administered medications for this visit.    Past Medical History  Diagnosis Date  . Chronic left lower quadrant pain   . Hydrosalpinx     LEFT  . Ovarian fibroma     RIGHT  . Pelvic adhesions   . Chronic constipation   . Membranous glomerulonephritis     followed by Dr. Clelia Schaumann  . Sjogren's syndrome   . Hypertension   . High cholesterol   . OZHYQMVH(846.9)     Past Surgical History  Procedure Laterality Date  . Total abdominal hysterectomy w/ bilateral salpingoophorectomy  2005  . Appendectomy  2002  . Laparoscopic cholecystectomy  2002  . Tonsillectomy  1941  . Vaginal hysterectomy  1981  History   Social History  . Marital Status: Widowed    Spouse Name: N/A    Number of Children: 4  . Years of Education: 12   Occupational History  . retired    Social History Main Topics  . Smoking status: Never Smoker   . Smokeless tobacco: Never Used  . Alcohol Use: No  . Drug Use: Not on file  . Sexual Activity: Not on file   Other Topics Concern  . Not on file   Social History Narrative  . No narrative on file     Filed Vitals:   01/14/13 1526  BP: 115/70  Height: 5\' 2"  (1.575 m)  Weight: 138 lb 6.4 oz (62.778 kg)    BP 115/70 Pulse 70   PHYSICAL EXAM General: NAD Neck: No JVD, no thyromegaly or thyroid nodule.  Lungs: Clear to auscultation bilaterally with normal respiratory effort. CV: Nondisplaced PMI.  Heart regular S1/S2, no S3/S4, no murmur.  No peripheral edema.  No carotid bruit.  Normal pedal pulses.  Abdomen: Soft, nontender, no hepatosplenomegaly, no distention.  Neurologic: Alert and oriented x 3.   Psych: Normal affect. Extremities: No clubbing or cyanosis.   ECG: reviewed and available in electronic records.      ASSESSMENT AND PLAN: 1. Chest pain: given her normal dobutamine stress echocardiogram, the etiology of her chest pain is likely non-cardiac, and quite possibly related to GERD. One could consider switching to omeprazole (PPI). 2. Shortness of breath: given her known connective tissue disorder, I would recommend obtaining pulmonary function tests, to see if there is any element of an obstructive component. I will defer to Dr. Neita Carp regarding this.   Prentice Docker, M.D., F.A.C.C.

## 2013-01-14 NOTE — Patient Instructions (Addendum)
Continue all current medications. Follow up as needed  

## 2013-01-26 DIAGNOSIS — Z23 Encounter for immunization: Secondary | ICD-10-CM | POA: Diagnosis not present

## 2013-01-28 ENCOUNTER — Ambulatory Visit: Payer: Medicare Other | Admitting: Cardiovascular Disease

## 2013-01-28 DIAGNOSIS — IMO0001 Reserved for inherently not codable concepts without codable children: Secondary | ICD-10-CM | POA: Diagnosis not present

## 2013-01-28 DIAGNOSIS — M25559 Pain in unspecified hip: Secondary | ICD-10-CM | POA: Diagnosis not present

## 2013-01-31 DIAGNOSIS — IMO0001 Reserved for inherently not codable concepts without codable children: Secondary | ICD-10-CM | POA: Diagnosis not present

## 2013-01-31 DIAGNOSIS — M25559 Pain in unspecified hip: Secondary | ICD-10-CM | POA: Diagnosis not present

## 2013-02-04 DIAGNOSIS — IMO0001 Reserved for inherently not codable concepts without codable children: Secondary | ICD-10-CM | POA: Diagnosis not present

## 2013-02-04 DIAGNOSIS — M25559 Pain in unspecified hip: Secondary | ICD-10-CM | POA: Diagnosis not present

## 2013-02-07 DIAGNOSIS — IMO0001 Reserved for inherently not codable concepts without codable children: Secondary | ICD-10-CM | POA: Diagnosis not present

## 2013-02-07 DIAGNOSIS — M25559 Pain in unspecified hip: Secondary | ICD-10-CM | POA: Diagnosis not present

## 2013-02-14 DIAGNOSIS — IMO0001 Reserved for inherently not codable concepts without codable children: Secondary | ICD-10-CM | POA: Diagnosis not present

## 2013-02-14 DIAGNOSIS — M25559 Pain in unspecified hip: Secondary | ICD-10-CM | POA: Diagnosis not present

## 2013-02-18 DIAGNOSIS — M25559 Pain in unspecified hip: Secondary | ICD-10-CM | POA: Diagnosis not present

## 2013-02-18 DIAGNOSIS — IMO0001 Reserved for inherently not codable concepts without codable children: Secondary | ICD-10-CM | POA: Diagnosis not present

## 2013-02-22 DIAGNOSIS — IMO0001 Reserved for inherently not codable concepts without codable children: Secondary | ICD-10-CM | POA: Diagnosis not present

## 2013-02-22 DIAGNOSIS — M25559 Pain in unspecified hip: Secondary | ICD-10-CM | POA: Diagnosis not present

## 2013-02-25 DIAGNOSIS — IMO0001 Reserved for inherently not codable concepts without codable children: Secondary | ICD-10-CM | POA: Diagnosis not present

## 2013-02-25 DIAGNOSIS — M25559 Pain in unspecified hip: Secondary | ICD-10-CM | POA: Diagnosis not present

## 2013-02-28 DIAGNOSIS — IMO0001 Reserved for inherently not codable concepts without codable children: Secondary | ICD-10-CM | POA: Diagnosis not present

## 2013-02-28 DIAGNOSIS — M25559 Pain in unspecified hip: Secondary | ICD-10-CM | POA: Diagnosis not present

## 2013-03-04 DIAGNOSIS — M25559 Pain in unspecified hip: Secondary | ICD-10-CM | POA: Diagnosis not present

## 2013-03-04 DIAGNOSIS — IMO0001 Reserved for inherently not codable concepts without codable children: Secondary | ICD-10-CM | POA: Diagnosis not present

## 2013-03-07 DIAGNOSIS — IMO0001 Reserved for inherently not codable concepts without codable children: Secondary | ICD-10-CM | POA: Diagnosis not present

## 2013-03-07 DIAGNOSIS — M25559 Pain in unspecified hip: Secondary | ICD-10-CM | POA: Diagnosis not present

## 2013-03-11 DIAGNOSIS — M25559 Pain in unspecified hip: Secondary | ICD-10-CM | POA: Diagnosis not present

## 2013-03-11 DIAGNOSIS — IMO0001 Reserved for inherently not codable concepts without codable children: Secondary | ICD-10-CM | POA: Diagnosis not present

## 2013-03-29 DIAGNOSIS — E7800 Pure hypercholesterolemia, unspecified: Secondary | ICD-10-CM | POA: Diagnosis not present

## 2013-03-29 DIAGNOSIS — Z79899 Other long term (current) drug therapy: Secondary | ICD-10-CM | POA: Diagnosis not present

## 2013-03-29 DIAGNOSIS — E213 Hyperparathyroidism, unspecified: Secondary | ICD-10-CM | POA: Diagnosis not present

## 2013-04-05 DIAGNOSIS — E7800 Pure hypercholesterolemia, unspecified: Secondary | ICD-10-CM | POA: Diagnosis not present

## 2013-04-05 DIAGNOSIS — R5381 Other malaise: Secondary | ICD-10-CM | POA: Diagnosis not present

## 2013-04-05 DIAGNOSIS — D72819 Decreased white blood cell count, unspecified: Secondary | ICD-10-CM | POA: Diagnosis not present

## 2013-04-05 DIAGNOSIS — E213 Hyperparathyroidism, unspecified: Secondary | ICD-10-CM | POA: Diagnosis not present

## 2013-04-05 DIAGNOSIS — M545 Low back pain: Secondary | ICD-10-CM | POA: Diagnosis not present

## 2013-04-05 DIAGNOSIS — I10 Essential (primary) hypertension: Secondary | ICD-10-CM | POA: Diagnosis not present

## 2013-04-08 ENCOUNTER — Ambulatory Visit (INDEPENDENT_AMBULATORY_CARE_PROVIDER_SITE_OTHER): Payer: Medicare Other | Admitting: Diagnostic Neuroimaging

## 2013-04-08 ENCOUNTER — Encounter: Payer: Self-pay | Admitting: Diagnostic Neuroimaging

## 2013-04-08 ENCOUNTER — Encounter (INDEPENDENT_AMBULATORY_CARE_PROVIDER_SITE_OTHER): Payer: Self-pay

## 2013-04-08 VITALS — BP 131/69 | HR 62 | Temp 97.7°F | Ht 62.5 in | Wt 140.0 lb

## 2013-04-08 DIAGNOSIS — H02403 Unspecified ptosis of bilateral eyelids: Secondary | ICD-10-CM

## 2013-04-08 DIAGNOSIS — H02409 Unspecified ptosis of unspecified eyelid: Secondary | ICD-10-CM

## 2013-04-08 DIAGNOSIS — R5381 Other malaise: Secondary | ICD-10-CM | POA: Diagnosis not present

## 2013-04-08 DIAGNOSIS — R531 Weakness: Secondary | ICD-10-CM

## 2013-04-08 NOTE — Patient Instructions (Signed)
I will check additional testing. 

## 2013-04-08 NOTE — Progress Notes (Signed)
[facility]  PATIENT: Peggy Ramirez DOB: 05/27/1933  REFERRING CLINICIAN: C Kinney HISTORY FROM: patient and daughter REASON FOR VISIT: new consult   HISTORICAL  CHIEF COMPLAINT:  Chief Complaint  Patient presents with  . Follow-up    HISTORY OF PRESENT ILLNESS:   UPDATE 04/08/13: Since last visit, patient is stable. No clear ptosis. Complains of upper leg, hip and back pain and weakness. No numbness. No slurred speech or trouble swallowing.  PRIOR HPI (12/22/12): 79 year old female with history of Sjogren's disease, hyperparathyroidism, hypertension, hypercholesteremia, migraine, acid reflux disease, here for evaluation of possible myasthenia gravis.  Patient has lifelong history of dry eyes and dry mouth, diagnosed with Sjogren's disease many years ago. She takes eyedrops for this.  Over past few months patient is noted eyelid heaviness especially when she first wakes up in the morning as well as in late into the evening. She denies any double vision, slurred speech, trouble breathing. She has some swallowing difficulty which she tends to ask reflux disease and dry mouth. Patient also notes some weakness in her legs especially after physical exertion. She has some difficulty with washing her hair and an and movements of her head. Patient went to eye doctor for this eyelid problem and he was concerned about myasthenia gravis and refer patient to me for further evaluation.  REVIEW OF SYSTEMS: Full 14 system review of systems performed and notable only for chills eye itching light sensitivity chest tightness trouble swallowing restless legs back pain muscle cramps headache cold intolerance.   ALLERGIES: Allergies  Allergen Reactions  . Levofloxacin     REACTION: rash  . Medrol [Methylprednisolone]   . Mycophenolate Mofetil   . Penicillins     REACTION: itching  . Sulfonamide Derivatives     REACTION: nausea  . Topiramate   . Verapamil     Felt  shakey    HOME MEDICATIONS: Outpatient Prescriptions Prior to Visit  Medication Sig Dispense Refill  . acetaminophen (TYLENOL) 325 MG tablet Take by mouth every 6 (six) hours as needed for pain.      . Biotin 10 MG CAPS Take 10 mg by mouth daily.      . cycloSPORINE (RESTASIS) 0.05 % ophthalmic emulsion 1 drop 2 (two) times daily.        . folic acid (FOLVITE) 800 MCG tablet Take 400 mcg by mouth daily.      . hydrochlorothiazide 25 MG tablet Take 25 mg by mouth daily.        . pantoprazole (PROTONIX) 40 MG tablet Take 40 mg by mouth 2 (two) times daily.       . polyethylene glycol powder (MIRALAX) powder Take 17 g by mouth daily.        . potassium chloride (MICRO-K) 10 MEQ CR capsule Take 1 tablet by mouth Daily.      . ranitidine (ZANTAC) 150 MG tablet Take 1 tablet by mouth Twice daily.      . Thiamine HCl (VITAMIN B-1) 250 MG tablet Take 250 mg by mouth daily.      . Zinc 50 MG TABS Take 1 tablet by mouth daily.        . vitamin A 40981 UNIT capsule Take 10,000 Units by mouth daily.      [name] doxycycline (VIBRA-TABS) 100 MG tablet Take 100 mg by mouth daily.       [name] ibuprofen (ADVIL) 200 MG tablet Take 200 mg by mouth every 6 (six) hours as needed for  pain.      . Propylene Glycol (SYSTANE BALANCE OP) Apply to eye as needed.       No facility-administered medications prior to visit.    PAST MEDICAL HISTORY: Past Medical History  Diagnosis Date  . Chronic left lower quadrant pain   . Hydrosalpinx     LEFT  . Ovarian fibroma     RIGHT  . Pelvic adhesions   . Chronic constipation   . Membranous glomerulonephritis     followed by Dr. Clelia Schaumann  . Sjogren's syndrome   . Hypertension   . High cholesterol   . Headache(784.0)     PAST SURGICAL HISTORY: Past Surgical History  Procedure Laterality Date  . Total abdominal hysterectomy w/ bilateral salpingoophorectomy  2005  . Appendectomy  2002  . Laparoscopic cholecystectomy  2002  . Tonsillectomy  1941  . Vaginal  hysterectomy  1981    FAMILY HISTORY: Family History  Problem Relation Age of Onset  . Heart disease    . Anxiety disorder    . Depression      SOCIAL HISTORY:  History   Social History  . Marital Status: Widowed    Spouse Name: N/A    Number of Children: 4  . Years of Education: 12   Occupational History  . retired    Social History Main Topics  . Smoking status: Never Smoker   . Smokeless tobacco: Never Used  . Alcohol Use: No  . Drug Use: Not on file  . Sexual Activity: Not on file   Other Topics Concern  . Not on file   Social History Narrative   Patient lives at home with my daughter.   Caffeine Use: 1-2 cups daily     PHYSICAL EXAM  Filed Vitals:   04/08/13 1146  BP: 131/69  Pulse: 62  Temp: 97.7 F (36.5 C)  TempSrc: Oral  Height: 5' 2.5" (1.588 m)  Weight: 140 lb (63.504 kg)    Not recorded    Body mass index is 25.18 kg/(m^2).  GENERAL EXAM: Patient is in no distress  CARDIOVASCULAR: Regular rate and rhythm, no murmurs, no carotid bruits  NEUROLOGIC: MENTAL STATUS: awake, alert, language fluent, comprehension intact, naming intact CRANIAL NERVE: pupils equal and reactive to light, visual fields full to confrontation, extraocular muscles intact, no nystagmus, facial sensation and strength symmetric, uvula midline, shoulder shrug symmetric, tongue midline. NO PTOSIS OR DIPLOPIA AFTER 1 MINUTE UPGAZE.  MOTOR: normal bulk and tone, full strength in the BUE; BLE (4/5 PROX, 5/5 DISTAL).  SENSORY: normal and symmetric to light touch, pinprick, temperature, vibration COORDINATION: finger-nose-finger, fine finger movements normal REFLEXES: BUE 1, BLE TRACE GAIT/STATION: narrow based gait; able to walk on toes, heels and tandem; romberg is negative   DIAGNOSTIC DATA (LABS, IMAGING, TESTING) - I reviewed patient records, labs, notes, testing and imaging myself where available.  No results found for this basename: WBC,  HGB,  HCT,  MCV,  PLT    No results found for this basename: na,  k,  cl,  co2,  glucose,  bun,  creatinine,  calcium,  prot,  albumin,  ast,  alt,  alkphos,  bilitot,  gfrnonaa,  gfraa   No results found for this basename: CHOL,  HDL,  LDLCALC,  LDLDIRECT,  TRIG,  CHOLHDL   No results found for this basename: HGBA1C   No results found for this basename: [id number]   Lab Results  Component Value Date   TSH 1.520 12/22/2012  12/22/12  CK, aldolase, TSH, AchR - normal   ASSESSMENT AND PLAN  79 y.o. year old female with intermittent bilateral ptosis (not present on exam today) and mild fatigable leg weakness in legs.   Ddx: myasthenia gravis, lambert-eaton syndrome, myopathy, thyroid disease  PLAN: Orders Placed This Encounter  Procedures  . Other/Misc lab test  . NCV with EMG(electromyography)    Return in about 6 months (around 10/07/2013).    Suanne Marker, MD 04/08/2013, 1:39 PM Certified in Neurology, Neurophysiology and Neuroimaging  [facility] [address], [address] [place], [place] [address] [phone]

## 2013-04-11 DIAGNOSIS — R5381 Other malaise: Secondary | ICD-10-CM | POA: Diagnosis not present

## 2013-04-11 DIAGNOSIS — H02409 Unspecified ptosis of unspecified eyelid: Secondary | ICD-10-CM | POA: Diagnosis not present

## 2013-04-13 ENCOUNTER — Encounter: Payer: 59 | Admitting: Diagnostic Neuroimaging

## 2013-06-07 ENCOUNTER — Encounter: Payer: Self-pay | Admitting: Diagnostic Neuroimaging

## 2013-07-20 ENCOUNTER — Encounter: Payer: Self-pay | Admitting: Diagnostic Neuroimaging

## 2013-07-20 ENCOUNTER — Encounter: Payer: 59 | Admitting: Radiology

## 2013-07-20 ENCOUNTER — Encounter: Payer: 59 | Admitting: Diagnostic Neuroimaging

## 2013-08-02 DIAGNOSIS — E213 Hyperparathyroidism, unspecified: Secondary | ICD-10-CM | POA: Diagnosis not present

## 2013-08-02 DIAGNOSIS — E7800 Pure hypercholesterolemia, unspecified: Secondary | ICD-10-CM | POA: Diagnosis not present

## 2013-08-02 DIAGNOSIS — I10 Essential (primary) hypertension: Secondary | ICD-10-CM | POA: Diagnosis not present

## 2013-08-09 DIAGNOSIS — E213 Hyperparathyroidism, unspecified: Secondary | ICD-10-CM | POA: Diagnosis not present

## 2013-08-09 DIAGNOSIS — D72819 Decreased white blood cell count, unspecified: Secondary | ICD-10-CM | POA: Diagnosis not present

## 2013-08-09 DIAGNOSIS — E7800 Pure hypercholesterolemia, unspecified: Secondary | ICD-10-CM | POA: Diagnosis not present

## 2013-08-09 DIAGNOSIS — N183 Chronic kidney disease, stage 3 unspecified: Secondary | ICD-10-CM | POA: Diagnosis not present

## 2013-08-09 DIAGNOSIS — R5383 Other fatigue: Secondary | ICD-10-CM | POA: Diagnosis not present

## 2013-08-09 DIAGNOSIS — R5381 Other malaise: Secondary | ICD-10-CM | POA: Diagnosis not present

## 2013-08-09 DIAGNOSIS — M545 Low back pain, unspecified: Secondary | ICD-10-CM | POA: Diagnosis not present

## 2013-08-09 DIAGNOSIS — I10 Essential (primary) hypertension: Secondary | ICD-10-CM | POA: Diagnosis not present

## 2013-09-01 DIAGNOSIS — R1310 Dysphagia, unspecified: Secondary | ICD-10-CM | POA: Diagnosis not present

## 2013-09-14 DIAGNOSIS — E213 Hyperparathyroidism, unspecified: Secondary | ICD-10-CM | POA: Diagnosis not present

## 2013-09-14 DIAGNOSIS — R1310 Dysphagia, unspecified: Secondary | ICD-10-CM | POA: Diagnosis not present

## 2013-09-14 DIAGNOSIS — M3500 Sicca syndrome, unspecified: Secondary | ICD-10-CM | POA: Diagnosis not present

## 2013-09-14 DIAGNOSIS — K2990 Gastroduodenitis, unspecified, without bleeding: Secondary | ICD-10-CM | POA: Diagnosis not present

## 2013-09-14 DIAGNOSIS — Z880 Allergy status to penicillin: Secondary | ICD-10-CM | POA: Diagnosis not present

## 2013-09-14 DIAGNOSIS — K2970 Gastritis, unspecified, without bleeding: Secondary | ICD-10-CM | POA: Diagnosis not present

## 2013-09-14 DIAGNOSIS — Z79899 Other long term (current) drug therapy: Secondary | ICD-10-CM | POA: Diagnosis not present

## 2013-09-14 DIAGNOSIS — K219 Gastro-esophageal reflux disease without esophagitis: Secondary | ICD-10-CM | POA: Diagnosis not present

## 2013-09-14 DIAGNOSIS — K259 Gastric ulcer, unspecified as acute or chronic, without hemorrhage or perforation: Secondary | ICD-10-CM | POA: Diagnosis not present

## 2013-09-14 DIAGNOSIS — Z8489 Family history of other specified conditions: Secondary | ICD-10-CM | POA: Diagnosis not present

## 2013-09-14 DIAGNOSIS — K222 Esophageal obstruction: Secondary | ICD-10-CM | POA: Diagnosis not present

## 2013-09-14 DIAGNOSIS — E785 Hyperlipidemia, unspecified: Secondary | ICD-10-CM | POA: Diagnosis not present

## 2013-09-14 DIAGNOSIS — Z883 Allergy status to other anti-infective agents status: Secondary | ICD-10-CM | POA: Diagnosis not present

## 2013-09-14 DIAGNOSIS — I10 Essential (primary) hypertension: Secondary | ICD-10-CM | POA: Diagnosis not present

## 2013-09-14 DIAGNOSIS — Z888 Allergy status to other drugs, medicaments and biological substances status: Secondary | ICD-10-CM | POA: Diagnosis not present

## 2013-09-14 DIAGNOSIS — G43909 Migraine, unspecified, not intractable, without status migrainosus: Secondary | ICD-10-CM | POA: Diagnosis not present

## 2013-09-14 DIAGNOSIS — Z882 Allergy status to sulfonamides status: Secondary | ICD-10-CM | POA: Diagnosis not present

## 2013-09-14 DIAGNOSIS — Z836 Family history of other diseases of the respiratory system: Secondary | ICD-10-CM | POA: Diagnosis not present

## 2013-09-28 DIAGNOSIS — H2510 Age-related nuclear cataract, unspecified eye: Secondary | ICD-10-CM | POA: Diagnosis not present

## 2013-09-29 DIAGNOSIS — K259 Gastric ulcer, unspecified as acute or chronic, without hemorrhage or perforation: Secondary | ICD-10-CM | POA: Diagnosis not present

## 2013-09-29 DIAGNOSIS — R1310 Dysphagia, unspecified: Secondary | ICD-10-CM | POA: Diagnosis not present

## 2013-10-10 DIAGNOSIS — K253 Acute gastric ulcer without hemorrhage or perforation: Secondary | ICD-10-CM | POA: Diagnosis not present

## 2013-12-05 DIAGNOSIS — E7800 Pure hypercholesterolemia, unspecified: Secondary | ICD-10-CM | POA: Diagnosis not present

## 2013-12-05 DIAGNOSIS — K2100 Gastro-esophageal reflux disease with esophagitis, without bleeding: Secondary | ICD-10-CM | POA: Diagnosis not present

## 2013-12-05 DIAGNOSIS — R5383 Other fatigue: Secondary | ICD-10-CM | POA: Diagnosis not present

## 2013-12-05 DIAGNOSIS — I10 Essential (primary) hypertension: Secondary | ICD-10-CM | POA: Diagnosis not present

## 2013-12-05 DIAGNOSIS — R5381 Other malaise: Secondary | ICD-10-CM | POA: Diagnosis not present

## 2013-12-05 DIAGNOSIS — E213 Hyperparathyroidism, unspecified: Secondary | ICD-10-CM | POA: Diagnosis not present

## 2013-12-06 DIAGNOSIS — Z1231 Encounter for screening mammogram for malignant neoplasm of breast: Secondary | ICD-10-CM | POA: Diagnosis not present

## 2013-12-14 DIAGNOSIS — N183 Chronic kidney disease, stage 3 unspecified: Secondary | ICD-10-CM | POA: Diagnosis not present

## 2013-12-14 DIAGNOSIS — M545 Low back pain, unspecified: Secondary | ICD-10-CM | POA: Diagnosis not present

## 2013-12-14 DIAGNOSIS — E7800 Pure hypercholesterolemia, unspecified: Secondary | ICD-10-CM | POA: Diagnosis not present

## 2013-12-14 DIAGNOSIS — I10 Essential (primary) hypertension: Secondary | ICD-10-CM | POA: Diagnosis not present

## 2013-12-14 DIAGNOSIS — R5383 Other fatigue: Secondary | ICD-10-CM | POA: Diagnosis not present

## 2013-12-14 DIAGNOSIS — R5381 Other malaise: Secondary | ICD-10-CM | POA: Diagnosis not present

## 2013-12-14 DIAGNOSIS — D72819 Decreased white blood cell count, unspecified: Secondary | ICD-10-CM | POA: Diagnosis not present

## 2013-12-14 DIAGNOSIS — E213 Hyperparathyroidism, unspecified: Secondary | ICD-10-CM | POA: Diagnosis not present

## 2014-01-04 DIAGNOSIS — L570 Actinic keratosis: Secondary | ICD-10-CM | POA: Diagnosis not present

## 2014-01-04 DIAGNOSIS — L299 Pruritus, unspecified: Secondary | ICD-10-CM | POA: Diagnosis not present

## 2014-01-26 DIAGNOSIS — Z23 Encounter for immunization: Secondary | ICD-10-CM | POA: Diagnosis not present

## 2014-03-13 DIAGNOSIS — J209 Acute bronchitis, unspecified: Secondary | ICD-10-CM | POA: Diagnosis not present

## 2014-03-13 DIAGNOSIS — J019 Acute sinusitis, unspecified: Secondary | ICD-10-CM | POA: Diagnosis not present

## 2014-04-03 DIAGNOSIS — E213 Hyperparathyroidism, unspecified: Secondary | ICD-10-CM | POA: Diagnosis not present

## 2014-04-03 DIAGNOSIS — E780 Pure hypercholesterolemia: Secondary | ICD-10-CM | POA: Diagnosis not present

## 2014-04-03 DIAGNOSIS — R5383 Other fatigue: Secondary | ICD-10-CM | POA: Diagnosis not present

## 2014-04-03 DIAGNOSIS — I10 Essential (primary) hypertension: Secondary | ICD-10-CM | POA: Diagnosis not present

## 2014-04-10 DIAGNOSIS — Z1389 Encounter for screening for other disorder: Secondary | ICD-10-CM | POA: Diagnosis not present

## 2014-04-10 DIAGNOSIS — K5909 Other constipation: Secondary | ICD-10-CM | POA: Diagnosis not present

## 2014-04-10 DIAGNOSIS — R300 Dysuria: Secondary | ICD-10-CM | POA: Diagnosis not present

## 2014-04-10 DIAGNOSIS — E213 Hyperparathyroidism, unspecified: Secondary | ICD-10-CM | POA: Diagnosis not present

## 2014-04-10 DIAGNOSIS — M3500 Sicca syndrome, unspecified: Secondary | ICD-10-CM | POA: Diagnosis not present

## 2014-04-10 DIAGNOSIS — I10 Essential (primary) hypertension: Secondary | ICD-10-CM | POA: Diagnosis not present

## 2014-04-10 DIAGNOSIS — E782 Mixed hyperlipidemia: Secondary | ICD-10-CM | POA: Diagnosis not present

## 2014-04-10 DIAGNOSIS — Z0001 Encounter for general adult medical examination with abnormal findings: Secondary | ICD-10-CM | POA: Diagnosis not present

## 2014-05-09 DIAGNOSIS — K2970 Gastritis, unspecified, without bleeding: Secondary | ICD-10-CM | POA: Diagnosis not present

## 2014-05-09 DIAGNOSIS — E782 Mixed hyperlipidemia: Secondary | ICD-10-CM | POA: Diagnosis not present

## 2014-05-09 DIAGNOSIS — R079 Chest pain, unspecified: Secondary | ICD-10-CM | POA: Diagnosis not present

## 2014-05-09 DIAGNOSIS — I10 Essential (primary) hypertension: Secondary | ICD-10-CM | POA: Diagnosis not present

## 2014-05-09 DIAGNOSIS — H6692 Otitis media, unspecified, left ear: Secondary | ICD-10-CM | POA: Diagnosis not present

## 2014-10-04 DIAGNOSIS — R739 Hyperglycemia, unspecified: Secondary | ICD-10-CM | POA: Diagnosis not present

## 2014-10-04 DIAGNOSIS — E782 Mixed hyperlipidemia: Secondary | ICD-10-CM | POA: Diagnosis not present

## 2014-10-04 DIAGNOSIS — E213 Hyperparathyroidism, unspecified: Secondary | ICD-10-CM | POA: Diagnosis not present

## 2014-10-04 DIAGNOSIS — E780 Pure hypercholesterolemia: Secondary | ICD-10-CM | POA: Diagnosis not present

## 2014-10-04 DIAGNOSIS — I10 Essential (primary) hypertension: Secondary | ICD-10-CM | POA: Diagnosis not present

## 2014-10-11 DIAGNOSIS — N183 Chronic kidney disease, stage 3 (moderate): Secondary | ICD-10-CM | POA: Diagnosis not present

## 2014-10-11 DIAGNOSIS — M79621 Pain in right upper arm: Secondary | ICD-10-CM | POA: Diagnosis not present

## 2014-10-11 DIAGNOSIS — E782 Mixed hyperlipidemia: Secondary | ICD-10-CM | POA: Diagnosis not present

## 2014-10-11 DIAGNOSIS — M545 Low back pain: Secondary | ICD-10-CM | POA: Diagnosis not present

## 2014-10-11 DIAGNOSIS — R0789 Other chest pain: Secondary | ICD-10-CM | POA: Diagnosis not present

## 2014-10-11 DIAGNOSIS — E213 Hyperparathyroidism, unspecified: Secondary | ICD-10-CM | POA: Diagnosis not present

## 2014-10-11 DIAGNOSIS — K210 Gastro-esophageal reflux disease with esophagitis: Secondary | ICD-10-CM | POA: Diagnosis not present

## 2014-10-18 DIAGNOSIS — J019 Acute sinusitis, unspecified: Secondary | ICD-10-CM | POA: Diagnosis not present

## 2014-10-24 ENCOUNTER — Ambulatory Visit: Payer: 59 | Admitting: Cardiovascular Disease

## 2014-10-26 ENCOUNTER — Ambulatory Visit (INDEPENDENT_AMBULATORY_CARE_PROVIDER_SITE_OTHER): Payer: Medicare Other | Admitting: Cardiology

## 2014-10-26 ENCOUNTER — Encounter: Payer: Self-pay | Admitting: Cardiology

## 2014-10-26 ENCOUNTER — Encounter: Payer: Self-pay | Admitting: *Deleted

## 2014-10-26 VITALS — BP 143/82 | HR 62 | Ht 62.0 in | Wt 134.0 lb

## 2014-10-26 DIAGNOSIS — R079 Chest pain, unspecified: Secondary | ICD-10-CM

## 2014-10-26 NOTE — Progress Notes (Signed)
Clinical Summary Ms. Loos is a 81 y.o.female seen today for follow up of the following medical problems. She is a regular patient of Dr Bronson Ing, she is seen today on my schedule by request of her pcp for recent chest pain.   1. Chest pain - from previous clinic notes has history of chest pain and SOB, normal DSE in 11/2011 - reports new symptoms of chest pain starting in the beginning of the year At that time episode occurred right after breakfast. Feeling of heart beating hard, unsure of any other symptoms. Lasted all day and into morning.  - 1 month later milder episode - over last few months has been having intermittent chest heaviness without palpitations. Can occur at any time. No relation to food or eating. Can be positional at times. Heaviness lasts approx 15 minutes. Occurs a few times a weeks. Notes DOE with activities that has increased recently as well. Symptoms are different from those at time of stress test in 2013.   Past Medical History  Diagnosis Date  . Chronic left lower quadrant pain   . Hydrosalpinx     LEFT  . Ovarian fibroma     RIGHT  . Pelvic adhesions   . Chronic constipation   . Membranous glomerulonephritis     followed by Dr. Elige Radon  . Sjogren's syndrome   . Hypertension   . High cholesterol   . Headache(784.0)      Allergies  Allergen Reactions  . Levofloxacin     REACTION: rash  . Medrol [Methylprednisolone]   . Mycophenolate Mofetil   . Penicillins     REACTION: itching  . Sulfonamide Derivatives     REACTION: nausea  . Topiramate   . Verapamil     Felt shakey     Current Outpatient Prescriptions  Medication Sig Dispense Refill  . acetaminophen (TYLENOL) 325 MG tablet Take by mouth every 6 (six) hours as needed for pain.    . Biotin 10 MG CAPS Take 10 mg by mouth daily.    . cycloSPORINE (RESTASIS) 0.05 % ophthalmic emulsion 1 drop 2 (two) times daily.      . folic acid (FOLVITE) 270 MCG tablet Take 400 mcg by mouth  daily.    . hydrochlorothiazide 25 MG tablet Take 25 mg by mouth daily.      . Multiple Vitamins-Calcium (ONE-A-DAY WOMENS PO) Take 1 tablet by mouth daily.    . pantoprazole (PROTONIX) 40 MG tablet Take 40 mg by mouth 2 (two) times daily.     . polyethylene glycol powder (MIRALAX) powder Take 17 g by mouth daily.      . potassium chloride (MICRO-K) 10 MEQ CR capsule Take 1 tablet by mouth Daily.    . ranitidine (ZANTAC) 150 MG tablet Take 1 tablet by mouth Twice daily.    . Thiamine HCl (VITAMIN B-1) 250 MG tablet Take 250 mg by mouth daily.    . vitamin A 8000 UNIT capsule Take 8,000 Units by mouth daily.    . vitamin B-12 (CYANOCOBALAMIN) 1000 MCG tablet Take 1,000 mcg by mouth daily.    . Zinc 50 MG TABS Take 1 tablet by mouth daily.       No current facility-administered medications for this visit.     Past Surgical History  Procedure Laterality Date  . Total abdominal hysterectomy w/ bilateral salpingoophorectomy  2005  . Appendectomy  2002  . Laparoscopic cholecystectomy  2002  . Tonsillectomy  1941  . Vaginal  hysterectomy  1981     Allergies  Allergen Reactions  . Levofloxacin     REACTION: rash  . Medrol [Methylprednisolone]   . Mycophenolate Mofetil   . Penicillins     REACTION: itching  . Sulfonamide Derivatives     REACTION: nausea  . Topiramate   . Verapamil     Felt shakey      Family History  Problem Relation Age of Onset  . Heart disease    . Anxiety disorder    . Depression       Social History Ms. Dingledine reports that she has never smoked. She has never used smokeless tobacco. Ms. Randle reports that she does not drink alcohol.   Review of Systems CONSTITUTIONAL: No weight loss, fever, chills, weakness or fatigue.  HEENT: Eyes: No visual loss, blurred vision, double vision or yellow sclerae.No hearing loss, sneezing, congestion, runny nose or sore throat.  SKIN: No rash or itching.  CARDIOVASCULAR: per HPI RESPIRATORY: No shortness of  breath, cough or sputum.  GASTROINTESTINAL: No anorexia, nausea, vomiting or diarrhea. No abdominal pain or blood.  GENITOURINARY: No burning on urination, no polyuria NEUROLOGICAL: No headache, dizziness, syncope, paralysis, ataxia, numbness or tingling in the extremities. No change in bowel or bladder control.  MUSCULOSKELETAL: No muscle, back pain, joint pain or stiffness.  LYMPHATICS: No enlarged nodes. No history of splenectomy.  PSYCHIATRIC: No history of depression or anxiety.  ENDOCRINOLOGIC: No reports of sweating, cold or heat intolerance. No polyuria or polydipsia.  [name]   Physical Examination Filed Vitals:   10/26/14 0957  BP: 143/82  Pulse: 62   Filed Vitals:   10/26/14 0957  Height: 5\' 2"  (1.575 m)  Weight: 134 lb (60.782 kg)    Gen: resting comfortably, no acute distress HEENT: no scleral icterus, pupils equal round and reactive, no palptable cervical adenopathy,  CV Resp: Clear to auscultation bilaterally GI: abdomen is soft, non-tender, non-distended, normal bowel sounds, no hepatosplenomegaly MSK: extremities are warm, no edema.  Skin: warm, no rash Neuro:  no focal deficits Psych: appropriate affect   Diagnostic Studies  11/2011 DSE Baseline LVEF 60-65%, negative for ischemia   Assessment and Plan  1. Chest pain - current symptoms different from 2013 when she had negative DSE - initial episode earlier this year sounds more like symptomatic arrhythmia. Those symptoms have not reoccured. Now having intermittent chest heaviness, increased DOE as well over the last few months - will obtain Lexiscan MPI to further evaluate. She is not able to run on treadmill due to unsteady gait, fall risk per patient report.    F/u based on stress results.       Arnoldo Lenis, M.D.

## 2014-10-26 NOTE — Patient Instructions (Signed)
Your physician recommends that you schedule a follow-up appointment TO BE DETERMINED AFTER TESTING  Your physician has requested that you have a lexiscan myoview. For further information please visit [email or web address]. Please follow instruction sheet, as given.  Your physician recommends that you continue on your current medications as directed. Please refer to the Current Medication list given to you today.  Thank you for choosing [facility]!!

## 2014-10-31 ENCOUNTER — Other Ambulatory Visit: Payer: Self-pay | Admitting: Rheumatology

## 2014-10-31 ENCOUNTER — Ambulatory Visit
Admission: RE | Admit: 2014-10-31 | Discharge: 2014-10-31 | Disposition: A | Discharge status: Home / Self Care | Payer: Medicare Other | Source: Ambulatory Visit | Attending: Rheumatology | Admitting: Rheumatology

## 2014-10-31 DIAGNOSIS — M5136 Other intervertebral disc degeneration, lumbar region: Secondary | ICD-10-CM | POA: Diagnosis not present

## 2014-10-31 DIAGNOSIS — Z79899 Other long term (current) drug therapy: Secondary | ICD-10-CM | POA: Diagnosis not present

## 2014-10-31 DIAGNOSIS — M545 Low back pain: Secondary | ICD-10-CM

## 2014-10-31 DIAGNOSIS — M2392 Unspecified internal derangement of left knee: Secondary | ICD-10-CM | POA: Diagnosis not present

## 2014-10-31 DIAGNOSIS — M25561 Pain in right knee: Secondary | ICD-10-CM | POA: Diagnosis not present

## 2014-10-31 DIAGNOSIS — M150 Primary generalized (osteo)arthritis: Secondary | ICD-10-CM | POA: Diagnosis not present

## 2014-10-31 DIAGNOSIS — R760 Raised antibody titer: Secondary | ICD-10-CM | POA: Diagnosis not present

## 2014-10-31 DIAGNOSIS — M25562 Pain in left knee: Secondary | ICD-10-CM | POA: Diagnosis not present

## 2014-11-01 ENCOUNTER — Encounter (HOSPITAL_COMMUNITY)
Admission: RE | Admit: 2014-11-01 | Discharge: 2014-11-01 | Disposition: A | Discharge status: Home / Self Care | Payer: Medicare Other | Source: Ambulatory Visit | Attending: Cardiology | Admitting: Cardiology

## 2014-11-01 ENCOUNTER — Inpatient Hospital Stay (HOSPITAL_COMMUNITY): Admission: RE | Admit: 2014-11-01 | Payer: 59 | Source: Ambulatory Visit

## 2014-11-01 ENCOUNTER — Encounter (HOSPITAL_COMMUNITY): Payer: Self-pay

## 2014-11-01 DIAGNOSIS — R079 Chest pain, unspecified: Secondary | ICD-10-CM | POA: Insufficient documentation

## 2014-11-01 LAB — NM MYOCAR MULTI W/SPECT W/WALL MOTION / EF
CHL CUP NUCLEAR SRS: 4
CHL CUP RESTING HR STRESS: 63 {beats}/min
LV sys vol: 22 mL
LVDIAVOL: 54 mL
NUC STRESS TID: 1.42
Peak HR: 90 {beats}/min
RATE: 0
SDS: 3
SSS: 7

## 2014-11-01 MED ORDER — SODIUM CHLORIDE 0.9 % IJ SOLN
INTRAMUSCULAR | Status: AC
  Administered 2014-11-01: 10 mL via INTRAVENOUS
  Filled 2014-11-01: qty 3

## 2014-11-01 MED ORDER — TECHNETIUM TC 99M SESTAMIBI GENERIC - CARDIOLITE
30.0000 | Freq: Once | INTRAVENOUS | Status: AC | PRN
  Administered 2014-11-01: 31.5 via INTRAVENOUS

## 2014-11-01 MED ORDER — TECHNETIUM TC 99M SESTAMIBI - CARDIOLITE
10.0000 | Freq: Once | INTRAVENOUS | Status: AC | PRN
  Administered 2014-11-01: 08:00:00 9 via INTRAVENOUS

## 2014-11-01 MED ORDER — REGADENOSON 0.4 MG/5ML IV SOLN
INTRAVENOUS | Status: AC
  Administered 2014-11-01: 0.4 mg via INTRAVENOUS
  Filled 2014-11-01: qty 5

## 2014-11-03 ENCOUNTER — Telehealth: Payer: Self-pay | Admitting: *Deleted

## 2014-11-03 NOTE — Telephone Encounter (Signed)
-----   Message from Arnoldo Lenis, MD sent at 11/02/2014 10:58 AM EDT ----- Stress test is normal, no evidence of blockages in the arteries of her heart. Needs to f/u with pcp to consider noncardiac causes of chest pain. Can f/u with [place] 6 months  Zandra Abts MD

## 2014-11-03 NOTE — Telephone Encounter (Signed)
Notes Recorded by Laurine Blazer, LPN on 09/28/2631 at 3:54 PM Patient notified. Result fwd to pmd. 6 month recall put in EPIC.

## 2014-12-11 DIAGNOSIS — M25562 Pain in left knee: Secondary | ICD-10-CM | POA: Diagnosis not present

## 2014-12-11 DIAGNOSIS — M545 Low back pain: Secondary | ICD-10-CM | POA: Diagnosis not present

## 2014-12-11 DIAGNOSIS — M159 Polyosteoarthritis, unspecified: Secondary | ICD-10-CM | POA: Diagnosis not present

## 2014-12-14 DIAGNOSIS — Z1231 Encounter for screening mammogram for malignant neoplasm of breast: Secondary | ICD-10-CM | POA: Diagnosis not present

## 2014-12-22 DIAGNOSIS — M1990 Unspecified osteoarthritis, unspecified site: Secondary | ICD-10-CM | POA: Diagnosis not present

## 2014-12-22 DIAGNOSIS — M545 Low back pain: Secondary | ICD-10-CM | POA: Diagnosis not present

## 2014-12-22 DIAGNOSIS — M5136 Other intervertebral disc degeneration, lumbar region: Secondary | ICD-10-CM | POA: Diagnosis not present

## 2014-12-28 DIAGNOSIS — M1990 Unspecified osteoarthritis, unspecified site: Secondary | ICD-10-CM | POA: Diagnosis not present

## 2014-12-28 DIAGNOSIS — M545 Low back pain: Secondary | ICD-10-CM | POA: Diagnosis not present

## 2014-12-28 DIAGNOSIS — M5136 Other intervertebral disc degeneration, lumbar region: Secondary | ICD-10-CM | POA: Diagnosis not present

## 2015-01-03 DIAGNOSIS — M1990 Unspecified osteoarthritis, unspecified site: Secondary | ICD-10-CM | POA: Diagnosis not present

## 2015-01-03 DIAGNOSIS — M5136 Other intervertebral disc degeneration, lumbar region: Secondary | ICD-10-CM | POA: Diagnosis not present

## 2015-01-03 DIAGNOSIS — M545 Low back pain: Secondary | ICD-10-CM | POA: Diagnosis not present

## 2015-01-05 DIAGNOSIS — M5136 Other intervertebral disc degeneration, lumbar region: Secondary | ICD-10-CM | POA: Diagnosis not present

## 2015-01-05 DIAGNOSIS — M1990 Unspecified osteoarthritis, unspecified site: Secondary | ICD-10-CM | POA: Diagnosis not present

## 2015-01-05 DIAGNOSIS — M545 Low back pain: Secondary | ICD-10-CM | POA: Diagnosis not present

## 2015-01-08 DIAGNOSIS — M545 Low back pain: Secondary | ICD-10-CM | POA: Diagnosis not present

## 2015-01-08 DIAGNOSIS — M1990 Unspecified osteoarthritis, unspecified site: Secondary | ICD-10-CM | POA: Diagnosis not present

## 2015-01-08 DIAGNOSIS — M5136 Other intervertebral disc degeneration, lumbar region: Secondary | ICD-10-CM | POA: Diagnosis not present

## 2015-01-09 DIAGNOSIS — L570 Actinic keratosis: Secondary | ICD-10-CM | POA: Diagnosis not present

## 2015-01-09 DIAGNOSIS — L821 Other seborrheic keratosis: Secondary | ICD-10-CM | POA: Diagnosis not present

## 2015-01-10 DIAGNOSIS — M5136 Other intervertebral disc degeneration, lumbar region: Secondary | ICD-10-CM | POA: Diagnosis not present

## 2015-01-10 DIAGNOSIS — M545 Low back pain: Secondary | ICD-10-CM | POA: Diagnosis not present

## 2015-01-10 DIAGNOSIS — M1990 Unspecified osteoarthritis, unspecified site: Secondary | ICD-10-CM | POA: Diagnosis not present

## 2015-01-15 DIAGNOSIS — M5136 Other intervertebral disc degeneration, lumbar region: Secondary | ICD-10-CM | POA: Diagnosis not present

## 2015-01-15 DIAGNOSIS — M545 Low back pain: Secondary | ICD-10-CM | POA: Diagnosis not present

## 2015-01-15 DIAGNOSIS — M1990 Unspecified osteoarthritis, unspecified site: Secondary | ICD-10-CM | POA: Diagnosis not present

## 2015-01-17 DIAGNOSIS — M1990 Unspecified osteoarthritis, unspecified site: Secondary | ICD-10-CM | POA: Diagnosis not present

## 2015-01-17 DIAGNOSIS — M5136 Other intervertebral disc degeneration, lumbar region: Secondary | ICD-10-CM | POA: Diagnosis not present

## 2015-01-17 DIAGNOSIS — M545 Low back pain: Secondary | ICD-10-CM | POA: Diagnosis not present

## 2015-01-22 DIAGNOSIS — M5136 Other intervertebral disc degeneration, lumbar region: Secondary | ICD-10-CM | POA: Diagnosis not present

## 2015-01-22 DIAGNOSIS — M1990 Unspecified osteoarthritis, unspecified site: Secondary | ICD-10-CM | POA: Diagnosis not present

## 2015-01-22 DIAGNOSIS — M545 Low back pain: Secondary | ICD-10-CM | POA: Diagnosis not present

## 2015-01-24 DIAGNOSIS — M545 Low back pain: Secondary | ICD-10-CM | POA: Diagnosis not present

## 2015-01-24 DIAGNOSIS — M1990 Unspecified osteoarthritis, unspecified site: Secondary | ICD-10-CM | POA: Diagnosis not present

## 2015-01-24 DIAGNOSIS — M5136 Other intervertebral disc degeneration, lumbar region: Secondary | ICD-10-CM | POA: Diagnosis not present

## 2015-01-31 DIAGNOSIS — M5136 Other intervertebral disc degeneration, lumbar region: Secondary | ICD-10-CM | POA: Diagnosis not present

## 2015-01-31 DIAGNOSIS — M1990 Unspecified osteoarthritis, unspecified site: Secondary | ICD-10-CM | POA: Diagnosis not present

## 2015-01-31 DIAGNOSIS — M545 Low back pain: Secondary | ICD-10-CM | POA: Diagnosis not present

## 2015-02-02 DIAGNOSIS — M1990 Unspecified osteoarthritis, unspecified site: Secondary | ICD-10-CM | POA: Diagnosis not present

## 2015-02-02 DIAGNOSIS — M545 Low back pain: Secondary | ICD-10-CM | POA: Diagnosis not present

## 2015-02-02 DIAGNOSIS — M5136 Other intervertebral disc degeneration, lumbar region: Secondary | ICD-10-CM | POA: Diagnosis not present

## 2015-02-07 DIAGNOSIS — M545 Low back pain: Secondary | ICD-10-CM | POA: Diagnosis not present

## 2015-02-07 DIAGNOSIS — M1990 Unspecified osteoarthritis, unspecified site: Secondary | ICD-10-CM | POA: Diagnosis not present

## 2015-02-07 DIAGNOSIS — Z23 Encounter for immunization: Secondary | ICD-10-CM | POA: Diagnosis not present

## 2015-02-07 DIAGNOSIS — M5136 Other intervertebral disc degeneration, lumbar region: Secondary | ICD-10-CM | POA: Diagnosis not present

## 2015-02-09 DIAGNOSIS — M545 Low back pain: Secondary | ICD-10-CM | POA: Diagnosis not present

## 2015-02-09 DIAGNOSIS — M1990 Unspecified osteoarthritis, unspecified site: Secondary | ICD-10-CM | POA: Diagnosis not present

## 2015-02-09 DIAGNOSIS — M5136 Other intervertebral disc degeneration, lumbar region: Secondary | ICD-10-CM | POA: Diagnosis not present

## 2015-02-20 DIAGNOSIS — M545 Low back pain: Secondary | ICD-10-CM | POA: Diagnosis not present

## 2015-02-20 DIAGNOSIS — M5136 Other intervertebral disc degeneration, lumbar region: Secondary | ICD-10-CM | POA: Diagnosis not present

## 2015-02-20 DIAGNOSIS — M1990 Unspecified osteoarthritis, unspecified site: Secondary | ICD-10-CM | POA: Diagnosis not present

## 2015-02-21 DIAGNOSIS — M545 Low back pain: Secondary | ICD-10-CM | POA: Diagnosis not present

## 2015-02-21 DIAGNOSIS — M5136 Other intervertebral disc degeneration, lumbar region: Secondary | ICD-10-CM | POA: Diagnosis not present

## 2015-02-21 DIAGNOSIS — M1990 Unspecified osteoarthritis, unspecified site: Secondary | ICD-10-CM | POA: Diagnosis not present

## 2015-03-15 DIAGNOSIS — M545 Low back pain: Secondary | ICD-10-CM | POA: Diagnosis not present

## 2015-03-15 DIAGNOSIS — M159 Polyosteoarthritis, unspecified: Secondary | ICD-10-CM | POA: Diagnosis not present

## 2015-03-15 DIAGNOSIS — M25562 Pain in left knee: Secondary | ICD-10-CM | POA: Diagnosis not present

## 2015-03-19 DIAGNOSIS — R768 Other specified abnormal immunological findings in serum: Secondary | ICD-10-CM | POA: Diagnosis not present

## 2015-03-19 DIAGNOSIS — M158 Other polyosteoarthritis: Secondary | ICD-10-CM | POA: Diagnosis not present

## 2015-03-19 DIAGNOSIS — Z9889 Other specified postprocedural states: Secondary | ICD-10-CM | POA: Diagnosis not present

## 2015-03-19 DIAGNOSIS — R51 Headache: Secondary | ICD-10-CM | POA: Diagnosis not present

## 2015-03-19 DIAGNOSIS — Z880 Allergy status to penicillin: Secondary | ICD-10-CM | POA: Diagnosis not present

## 2015-03-19 DIAGNOSIS — M170 Bilateral primary osteoarthritis of knee: Secondary | ICD-10-CM | POA: Diagnosis not present

## 2015-03-19 DIAGNOSIS — M19041 Primary osteoarthritis, right hand: Secondary | ICD-10-CM | POA: Diagnosis not present

## 2015-03-19 DIAGNOSIS — E210 Primary hyperparathyroidism: Secondary | ICD-10-CM | POA: Diagnosis not present

## 2015-03-19 DIAGNOSIS — M19042 Primary osteoarthritis, left hand: Secondary | ICD-10-CM | POA: Diagnosis not present

## 2015-03-19 DIAGNOSIS — Z79899 Other long term (current) drug therapy: Secondary | ICD-10-CM | POA: Diagnosis not present

## 2015-03-19 DIAGNOSIS — M47816 Spondylosis without myelopathy or radiculopathy, lumbar region: Secondary | ICD-10-CM | POA: Diagnosis not present

## 2015-03-19 DIAGNOSIS — M3500 Sicca syndrome, unspecified: Secondary | ICD-10-CM | POA: Diagnosis not present

## 2015-03-19 DIAGNOSIS — M329 Systemic lupus erythematosus, unspecified: Secondary | ICD-10-CM | POA: Diagnosis not present

## 2015-04-11 DIAGNOSIS — E7800 Pure hypercholesterolemia, unspecified: Secondary | ICD-10-CM | POA: Diagnosis not present

## 2015-04-11 DIAGNOSIS — E213 Hyperparathyroidism, unspecified: Secondary | ICD-10-CM | POA: Diagnosis not present

## 2015-04-11 DIAGNOSIS — I10 Essential (primary) hypertension: Secondary | ICD-10-CM | POA: Diagnosis not present

## 2015-04-17 DIAGNOSIS — M545 Low back pain: Secondary | ICD-10-CM | POA: Diagnosis not present

## 2015-04-17 DIAGNOSIS — E782 Mixed hyperlipidemia: Secondary | ICD-10-CM | POA: Diagnosis not present

## 2015-04-17 DIAGNOSIS — Z1389 Encounter for screening for other disorder: Secondary | ICD-10-CM | POA: Diagnosis not present

## 2015-04-17 DIAGNOSIS — M3509 Sicca syndrome with other organ involvement: Secondary | ICD-10-CM | POA: Diagnosis not present

## 2015-04-17 DIAGNOSIS — E213 Hyperparathyroidism, unspecified: Secondary | ICD-10-CM | POA: Diagnosis not present

## 2015-04-17 DIAGNOSIS — K210 Gastro-esophageal reflux disease with esophagitis: Secondary | ICD-10-CM | POA: Diagnosis not present

## 2015-04-17 DIAGNOSIS — N183 Chronic kidney disease, stage 3 (moderate): Secondary | ICD-10-CM | POA: Diagnosis not present

## 2015-05-01 DIAGNOSIS — J0101 Acute recurrent maxillary sinusitis: Secondary | ICD-10-CM | POA: Diagnosis not present

## 2015-05-07 ENCOUNTER — Ambulatory Visit: Payer: Medicare Other | Admitting: Cardiology

## 2015-05-31 ENCOUNTER — Encounter: Payer: Self-pay | Admitting: Cardiology

## 2015-05-31 ENCOUNTER — Ambulatory Visit (INDEPENDENT_AMBULATORY_CARE_PROVIDER_SITE_OTHER): Payer: PPO | Admitting: Cardiology

## 2015-05-31 VITALS — BP 138/82 | HR 70 | Ht 62.0 in | Wt 138.0 lb

## 2015-05-31 DIAGNOSIS — R079 Chest pain, unspecified: Secondary | ICD-10-CM

## 2015-05-31 DIAGNOSIS — I10 Essential (primary) hypertension: Secondary | ICD-10-CM

## 2015-05-31 NOTE — Progress Notes (Signed)
Patient ID: Peggy Ramirez, female   DOB: [birth year], 82 y.o.   MRN: [id number]     Clinical Summary Peggy Ramirez is a 82 y.o.female seen today for follow up of the following medical problems.   1. Chest pain - from previous clinic notes has history of chest pain and SOB, normal DSE in 11/2011 - last visit reported worsening symptoms of chest pain, she was referred for a stress test - 10/2014 Lexiscan showed no ischemia - since last visit symptoms are somewhat improved.   2. HTN - compliant with meds  Past Medical History  Diagnosis Date  . Chronic left lower quadrant pain   . Hydrosalpinx     LEFT  . Ovarian fibroma     RIGHT  . Pelvic adhesions   . Chronic constipation   . Membranous glomerulonephritis     followed by Dr. Elige Radon  . Sjogren's syndrome   . Hypertension   . High cholesterol   . Headache(784.0)      Allergies  Allergen Reactions  . Levofloxacin     REACTION: rash  . Medrol [Methylprednisolone]   . Mycophenolate Mofetil   . Penicillins     REACTION: itching  . Sulfonamide Derivatives     REACTION: nausea  . Topiramate   . Verapamil     Felt shakey     Current Outpatient Prescriptions  Medication Sig Dispense Refill  . Biotin 10 MG CAPS Take 10 mg by mouth daily.    . cycloSPORINE (RESTASIS) 0.05 % ophthalmic emulsion Place 1 drop into both eyes 2 (two) times daily as needed.     . folic acid (FOLVITE) Q000111Q MCG tablet Take 400 mcg by mouth daily as needed.     . hydrochlorothiazide 25 MG tablet Take 25 mg by mouth daily.      [name] loratadine (CLARITIN) 10 MG tablet Take 10 mg by mouth daily.    . Multiple Vitamins-Calcium (ONE-A-DAY WOMENS PO) Take 1 tablet by mouth daily.    . pantoprazole (PROTONIX) 40 MG tablet Take 40 mg by mouth 2 (two) times daily.     . polyethylene glycol powder (MIRALAX) powder Take 17 g by mouth daily.      . potassium chloride (MICRO-K) 10 MEQ CR capsule Take 1 tablet by mouth Daily.    . ranitidine (ZANTAC) 150 MG  tablet Take 1 tablet by mouth Twice daily.    . Thiamine HCl (VITAMIN B-1) 250 MG tablet Take 250 mg by mouth daily.    . vitamin A 8000 UNIT capsule Take 8,000 Units by mouth daily.    . vitamin B-12 (CYANOCOBALAMIN) 1000 MCG tablet Take 1,000 mcg by mouth daily.     No current facility-administered medications for this visit.     Past Surgical History  Procedure Laterality Date  . Total abdominal hysterectomy w/ bilateral salpingoophorectomy  2005  . Appendectomy  2002  . Laparoscopic cholecystectomy  2002  . Tonsillectomy  1941  . Vaginal hysterectomy  1981     Allergies  Allergen Reactions  . Levofloxacin     REACTION: rash  . Medrol [Methylprednisolone]   . Mycophenolate Mofetil   . Penicillins     REACTION: itching  . Sulfonamide Derivatives     REACTION: nausea  . Topiramate   . Verapamil     Felt shakey      Family History  Problem Relation Age of Onset  . Heart disease    . Anxiety disorder    . Depression  Social History Peggy Ramirez reports that she has never smoked. She has never used smokeless tobacco. Peggy Ramirez reports that she does not drink alcohol.   Review of Systems CONSTITUTIONAL: No weight loss, fever, chills, weakness or fatigue.  HEENT: Eyes: No visual loss, blurred vision, double vision or yellow sclerae.No hearing loss, sneezing, congestion, runny nose or sore throat.  SKIN: No rash or itching.  CARDIOVASCULAR: per hpi RESPIRATORY: No shortness of breath, cough or sputum.  GASTROINTESTINAL: No anorexia, nausea, vomiting or diarrhea. No abdominal pain or blood.  GENITOURINARY: No burning on urination, no polyuria NEUROLOGICAL: No headache, dizziness, syncope, paralysis, ataxia, numbness or tingling in the extremities. No change in bowel or bladder control.  MUSCULOSKELETAL: No muscle, back pain, joint pain or stiffness.  LYMPHATICS: No enlarged nodes. No history of splenectomy.  PSYCHIATRIC: No history of depression or anxiety.    ENDOCRINOLOGIC: No reports of sweating, cold or heat intolerance. No polyuria or polydipsia.  [name]   Physical Examination Filed Vitals:   05/31/15 1526  BP: 138/82  Pulse: 70   Filed Vitals:   05/31/15 1526  Height: 5\' 2"  (1.575 m)  Weight: 138 lb (62.596 kg)    Gen: resting comfortably, no acute distress HEENT: no scleral icterus, pupils equal round and reactive, no palptable cervical adenopathy,  CV: RRR, no m/r/g, no jvd Resp: Clear to auscultation bilaterally GI: abdomen is soft, non-tender, non-distended, normal bowel sounds, no hepatosplenomegaly MSK: extremities are warm, no edema.  Skin: warm, no rash Neuro:  no focal deficits Psych: appropriate affect   Diagnostic Studies  11/2011 DSE Baseline LVEF 60-65%, negative for ischemia  10/2014 Lexiscan MPI  The study is normal.  This is a low risk study.  Nuclear stress EF: 60%.  There was no ST segment deviation noted during stress.   Assessment and Plan   1. Chest pain - long history of symptoms. Negative DSE in 2013, recent lexiscan negative for ischemia. Appears to be noncardiac chest pain, no further testing planned at this time  2. HTN - at goal, continue current meds   F/u 6 months      Peggy Ramirez, M.D.

## 2015-05-31 NOTE — Patient Instructions (Signed)

## 2015-07-27 DIAGNOSIS — R1033 Periumbilical pain: Secondary | ICD-10-CM | POA: Diagnosis not present

## 2015-07-27 DIAGNOSIS — R1013 Epigastric pain: Secondary | ICD-10-CM | POA: Diagnosis not present

## 2015-08-03 DIAGNOSIS — R109 Unspecified abdominal pain: Secondary | ICD-10-CM | POA: Diagnosis not present

## 2015-08-03 DIAGNOSIS — Z9049 Acquired absence of other specified parts of digestive tract: Secondary | ICD-10-CM | POA: Diagnosis not present

## 2015-08-03 DIAGNOSIS — R1013 Epigastric pain: Secondary | ICD-10-CM | POA: Diagnosis not present

## 2015-08-08 DIAGNOSIS — N183 Chronic kidney disease, stage 3 (moderate): Secondary | ICD-10-CM | POA: Diagnosis not present

## 2015-08-08 DIAGNOSIS — K210 Gastro-esophageal reflux disease with esophagitis: Secondary | ICD-10-CM | POA: Diagnosis not present

## 2015-08-08 DIAGNOSIS — E782 Mixed hyperlipidemia: Secondary | ICD-10-CM | POA: Diagnosis not present

## 2015-08-08 DIAGNOSIS — I10 Essential (primary) hypertension: Secondary | ICD-10-CM | POA: Diagnosis not present

## 2015-08-15 DIAGNOSIS — M3509 Sicca syndrome with other organ involvement: Secondary | ICD-10-CM | POA: Diagnosis not present

## 2015-08-15 DIAGNOSIS — E782 Mixed hyperlipidemia: Secondary | ICD-10-CM | POA: Diagnosis not present

## 2015-08-15 DIAGNOSIS — E213 Hyperparathyroidism, unspecified: Secondary | ICD-10-CM | POA: Diagnosis not present

## 2015-08-15 DIAGNOSIS — M545 Low back pain: Secondary | ICD-10-CM | POA: Diagnosis not present

## 2015-08-15 DIAGNOSIS — K210 Gastro-esophageal reflux disease with esophagitis: Secondary | ICD-10-CM | POA: Diagnosis not present

## 2015-08-15 DIAGNOSIS — N183 Chronic kidney disease, stage 3 (moderate): Secondary | ICD-10-CM | POA: Diagnosis not present

## 2015-10-15 ENCOUNTER — Inpatient Hospital Stay (HOSPITAL_COMMUNITY)
Admission: EM | Admit: 2015-10-15 | Discharge: 2015-10-16 | DRG: 065 | Disposition: A | Discharge status: Home / Self Care | Payer: PPO | Attending: Internal Medicine | Admitting: Internal Medicine

## 2015-10-15 ENCOUNTER — Encounter (HOSPITAL_COMMUNITY): Payer: Self-pay | Admitting: Emergency Medicine

## 2015-10-15 ENCOUNTER — Other Ambulatory Visit: Payer: Self-pay

## 2015-10-15 ENCOUNTER — Emergency Department (HOSPITAL_COMMUNITY): Payer: PPO

## 2015-10-15 DIAGNOSIS — G459 Transient cerebral ischemic attack, unspecified: Secondary | ICD-10-CM | POA: Diagnosis not present

## 2015-10-15 DIAGNOSIS — M3500 Sicca syndrome, unspecified: Secondary | ICD-10-CM | POA: Diagnosis not present

## 2015-10-15 DIAGNOSIS — I10 Essential (primary) hypertension: Secondary | ICD-10-CM | POA: Diagnosis present

## 2015-10-15 DIAGNOSIS — E785 Hyperlipidemia, unspecified: Secondary | ICD-10-CM | POA: Diagnosis not present

## 2015-10-15 DIAGNOSIS — M6289 Other specified disorders of muscle: Secondary | ICD-10-CM

## 2015-10-15 DIAGNOSIS — Z9049 Acquired absence of other specified parts of digestive tract: Secondary | ICD-10-CM | POA: Diagnosis not present

## 2015-10-15 DIAGNOSIS — R270 Ataxia, unspecified: Secondary | ICD-10-CM | POA: Diagnosis not present

## 2015-10-15 DIAGNOSIS — K219 Gastro-esophageal reflux disease without esophagitis: Secondary | ICD-10-CM | POA: Diagnosis present

## 2015-10-15 DIAGNOSIS — Z90710 Acquired absence of both cervix and uterus: Secondary | ICD-10-CM | POA: Diagnosis not present

## 2015-10-15 DIAGNOSIS — I639 Cerebral infarction, unspecified: Principal | ICD-10-CM | POA: Diagnosis present

## 2015-10-15 DIAGNOSIS — G8194 Hemiplegia, unspecified affecting left nondominant side: Secondary | ICD-10-CM | POA: Diagnosis not present

## 2015-10-15 DIAGNOSIS — R29898 Other symptoms and signs involving the musculoskeletal system: Secondary | ICD-10-CM | POA: Diagnosis present

## 2015-10-15 DIAGNOSIS — E7800 Pure hypercholesterolemia, unspecified: Secondary | ICD-10-CM | POA: Diagnosis present

## 2015-10-15 DIAGNOSIS — I63233 Cerebral infarction due to unspecified occlusion or stenosis of bilateral carotid arteries: Secondary | ICD-10-CM | POA: Diagnosis not present

## 2015-10-15 DIAGNOSIS — R531 Weakness: Secondary | ICD-10-CM | POA: Diagnosis not present

## 2015-10-15 DIAGNOSIS — I6602 Occlusion and stenosis of left middle cerebral artery: Secondary | ICD-10-CM | POA: Diagnosis not present

## 2015-10-15 DIAGNOSIS — R200 Anesthesia of skin: Secondary | ICD-10-CM | POA: Diagnosis not present

## 2015-10-15 DIAGNOSIS — Z818 Family history of other mental and behavioral disorders: Secondary | ICD-10-CM

## 2015-10-15 DIAGNOSIS — M6281 Muscle weakness (generalized): Secondary | ICD-10-CM | POA: Diagnosis not present

## 2015-10-15 HISTORY — DX: Reserved for concepts with insufficient information to code with codable children: IMO0002

## 2015-10-15 HISTORY — DX: Systemic lupus erythematosus, unspecified: M32.9

## 2015-10-15 LAB — I-STAT CHEM 8, ED
BUN: 14 mg/dL (ref 6–20)
CALCIUM ION: 1.32 mmol/L — AB (ref 1.13–1.30)
Chloride: 98 mmol/L — ABNORMAL LOW (ref 101–111)
Creatinine, Ser: 0.9 mg/dL (ref 0.44–1.00)
Glucose, Bld: 85 mg/dL (ref 65–99)
HCT: 45 % (ref 36.0–46.0)
Hemoglobin: 15.3 g/dL — ABNORMAL HIGH (ref 12.0–15.0)
Potassium: 3.8 mmol/L (ref 3.5–5.1)
SODIUM: 136 mmol/L (ref 135–145)
TCO2: 26 mmol/L (ref 0–100)

## 2015-10-15 LAB — DIFFERENTIAL
Basophils Absolute: 0 10*3/uL (ref 0.0–0.1)
Basophils Relative: 1 %
EOS PCT: 2 %
Eosinophils Absolute: 0.1 10*3/uL (ref 0.0–0.7)
LYMPHS ABS: 0.9 10*3/uL (ref 0.7–4.0)
LYMPHS PCT: 25 %
MONO ABS: 0.5 10*3/uL (ref 0.1–1.0)
Monocytes Relative: 14 %
Neutro Abs: 2.2 10*3/uL (ref 1.7–7.7)
Neutrophils Relative %: 58 %

## 2015-10-15 LAB — RAPID URINE DRUG SCREEN, HOSP PERFORMED
Amphetamines: NOT DETECTED
BENZODIAZEPINES: NOT DETECTED
Barbiturates: NOT DETECTED
COCAINE: NOT DETECTED
Opiates: NOT DETECTED
Tetrahydrocannabinol: NOT DETECTED

## 2015-10-15 LAB — URINALYSIS, ROUTINE W REFLEX MICROSCOPIC
Bilirubin Urine: NEGATIVE
GLUCOSE, UA: NEGATIVE mg/dL
HGB URINE DIPSTICK: NEGATIVE
Ketones, ur: NEGATIVE mg/dL
Leukocytes, UA: NEGATIVE
Nitrite: NEGATIVE
Protein, ur: NEGATIVE mg/dL
SPECIFIC GRAVITY, URINE: 1.01 (ref 1.005–1.030)
pH: 5.5 (ref 5.0–8.0)

## 2015-10-15 LAB — CBC
HCT: 41.4 % (ref 36.0–46.0)
HEMOGLOBIN: 14.1 g/dL (ref 12.0–15.0)
MCH: 30.6 pg (ref 26.0–34.0)
MCHC: 34.1 g/dL (ref 30.0–36.0)
MCV: 89.8 fL (ref 78.0–100.0)
Platelets: 223 10*3/uL (ref 150–400)
RBC: 4.61 MIL/uL (ref 3.87–5.11)
RDW: 12.2 % (ref 11.5–15.5)
WBC: 3.8 10*3/uL — ABNORMAL LOW (ref 4.0–10.5)

## 2015-10-15 LAB — COMPREHENSIVE METABOLIC PANEL
ALK PHOS: 69 U/L (ref 38–126)
ALT: 16 U/L (ref 14–54)
ANION GAP: 5 (ref 5–15)
AST: 26 U/L (ref 15–41)
Albumin: 4.4 g/dL (ref 3.5–5.0)
BILIRUBIN TOTAL: 1 mg/dL (ref 0.3–1.2)
BUN: 15 mg/dL (ref 6–20)
CALCIUM: 10.2 mg/dL (ref 8.9–10.3)
CO2: 27 mmol/L (ref 22–32)
Chloride: 99 mmol/L — ABNORMAL LOW (ref 101–111)
Creatinine, Ser: 0.78 mg/dL (ref 0.44–1.00)
GFR calc non Af Amer: >60 mL/min (ref >60)
Glucose, Bld: 88 mg/dL (ref 65–99)
Potassium: 3.7 mmol/L (ref 3.5–5.1)
Sodium: 131 mmol/L — ABNORMAL LOW (ref 135–145)
TOTAL PROTEIN: 7.8 g/dL (ref 6.5–8.1)

## 2015-10-15 LAB — TROPONIN I

## 2015-10-15 LAB — PROTIME-INR
INR: 1.06 (ref 0.00–1.49)
PROTHROMBIN TIME: 14 s (ref 11.6–15.2)

## 2015-10-15 LAB — I-STAT TROPONIN, ED: Troponin i, poc: 0.02 ng/mL (ref 0.00–0.08)

## 2015-10-15 LAB — APTT: aPTT: 32 seconds (ref 24–37)

## 2015-10-15 LAB — ETHANOL: Alcohol, Ethyl (B): <5 mg/dL (ref <5)

## 2015-10-15 MED ORDER — STROKE: EARLY STAGES OF RECOVERY BOOK
Freq: Once | Status: AC
  Administered 2015-10-16: 08:00:00
  Filled 2015-10-15: qty 1

## 2015-10-15 MED ORDER — POLYETHYLENE GLYCOL 3350 17 G PO PACK
17.0000 g | PACK | Freq: Every day | ORAL | Status: DC
  Administered 2015-10-15 – 2015-10-16 (×2): 17 g via ORAL
  Filled 2015-10-15 (×2): qty 1

## 2015-10-15 MED ORDER — ACETAMINOPHEN 325 MG PO TABS
650.0000 mg | ORAL_TABLET | ORAL | Status: DC | PRN
  Administered 2015-10-16: 650 mg via ORAL
  Filled 2015-10-15: qty 2

## 2015-10-15 MED ORDER — FAMOTIDINE 20 MG PO TABS
20.0000 mg | ORAL_TABLET | Freq: Two times a day (BID) | ORAL | Status: DC
  Administered 2015-10-15 – 2015-10-16 (×2): 20 mg via ORAL
  Filled 2015-10-15 (×2): qty 1

## 2015-10-15 MED ORDER — ASPIRIN 300 MG RE SUPP: 300.0000 mg | Freq: Every day | RECTAL | Status: DC

## 2015-10-15 MED ORDER — PANTOPRAZOLE SODIUM 40 MG PO TBEC
40.0000 mg | DELAYED_RELEASE_TABLET | Freq: Two times a day (BID) | ORAL | Status: DC
  Administered 2015-10-15 – 2015-10-16 (×2): 40 mg via ORAL
  Filled 2015-10-15 (×2): qty 1

## 2015-10-15 MED ORDER — POLYETHYLENE GLYCOL 3350 17 GM/SCOOP PO POWD: 17.0000 g | Freq: Every day | ORAL | Status: DC

## 2015-10-15 MED ORDER — ENOXAPARIN SODIUM 40 MG/0.4ML ~~LOC~~ SOLN
40.0000 mg | SUBCUTANEOUS | Status: DC
  Administered 2015-10-15: 40 mg via SUBCUTANEOUS
  Filled 2015-10-15: qty 0.4

## 2015-10-15 MED ORDER — LORAZEPAM 2 MG/ML IJ SOLN: 1.0000 mg | Freq: Once | INTRAMUSCULAR | Status: DC

## 2015-10-15 MED ORDER — FOLIC ACID 1 MG PO TABS
1.0000 mg | ORAL_TABLET | Freq: Every day | ORAL | Status: DC
  Administered 2015-10-15 – 2015-10-16 (×2): 1 mg via ORAL
  Filled 2015-10-15 (×2): qty 1

## 2015-10-15 MED ORDER — ASPIRIN 325 MG PO TABS
325.0000 mg | ORAL_TABLET | Freq: Every day | ORAL | Status: DC
  Administered 2015-10-15 – 2015-10-16 (×2): 325 mg via ORAL
  Filled 2015-10-15 (×2): qty 1

## 2015-10-15 MED ORDER — SENNOSIDES-DOCUSATE SODIUM 8.6-50 MG PO TABS
1.0000 | ORAL_TABLET | Freq: Every evening | ORAL | Status: DC | PRN
Start: 2015-10-15 — End: 2015-10-16

## 2015-10-15 MED ORDER — ACETAMINOPHEN 650 MG RE SUPP: 650.0000 mg | RECTAL | Status: DC | PRN

## 2015-10-15 NOTE — ED Notes (Signed)
Tele neurologist 2nd call into room.

## 2015-10-15 NOTE — ED Notes (Addendum)
CT called to push CT report through again per Tele neurologist request. Tele neurologist states he willcall back into room via tele neuro machine after speaking with EDP.

## 2015-10-15 NOTE — ED Notes (Signed)
Attempted to call report, unable to give at this time.  Floor will call back for report.

## 2015-10-15 NOTE — ED Notes (Signed)
Dr. Wickline at bedside.  

## 2015-10-15 NOTE — ED Notes (Signed)
Patient comes in from local dentist office with c/o left arm numbess and difficulty with grasping objects in left hand that started at 1030 today. Left hand is weak. Right grip strong. Legs equal bilaterally with steady gait. Denies HA, vision problems, speech normal. States she feels unsteady on her feet, but was ambulatory to room.

## 2015-10-15 NOTE — ED Notes (Signed)
Decision for No TPA at this time.

## 2015-10-15 NOTE — ED Provider Notes (Signed)
History  By signing my name below, I, Peggy Ramirez, attest that this documentation has been prepared under the direction and in the presence of Ripley Fraise, MD. Electronically Signed: Bea Ramirez, ED Scribe. 10/15/2015. 11:25 AM.  Chief Complaint  Patient presents with  . Extremity Weakness   Patient is a 82 y.o. female presenting with extremity weakness. The history is provided by the patient and medical records. No language interpreter was used.  Extremity Weakness This is a new problem. The current episode started less than 1 hour ago. The problem occurs constantly. The problem has not changed since onset.Nothing aggravates the symptoms. Nothing relieves the symptoms. She has tried acetaminophen and ASA for the symptoms. The treatment provided no relief.    HPI Comments:  Britanny DORETTE FOGELMAN is a 82 y.o. female who presents to the Emergency Department complaining of LUE numbness, to the elbow, that began approximately one hour ago. She states she was at the dentist office after a cleaning about to leave and noticed she started dropping things from her left hand. Pt states she felt fine when she woke this morning. She was given a baby ASA and Tylenol before leaving the dentist's office. She denies modifying factors. She denies CP, SOB, dizziness, fever, chills, nausea, vomiting, HA. PMHx of lupus, Sjogren's syndrome, HLD, HTN.  Past Medical History  Diagnosis Date  . Chronic left lower quadrant pain   . Hydrosalpinx     LEFT  . Ovarian fibroma     RIGHT  . Pelvic adhesions   . Chronic constipation   . Membranous glomerulonephritis     followed by Dr. Elige Radon  . Sjogren's syndrome ([place])   . Hypertension   . High cholesterol   . Headache(784.0)   . Lupus [facility])    Past Surgical History  Procedure Laterality Date  . Total abdominal hysterectomy w/ bilateral salpingoophorectomy  2005  . Appendectomy  2002  . Laparoscopic cholecystectomy  2002  . Tonsillectomy  1941   . Vaginal hysterectomy  1981   Family History  Problem Relation Age of Onset  . Heart disease    . Anxiety disorder    . Depression     Social History  Substance Use Topics  . Smoking status: Never Smoker   . Smokeless tobacco: Never Used  . Alcohol Use: No   OB History    No data available     Review of Systems  Musculoskeletal: Positive for extremity weakness.  Neurological: Positive for weakness and numbness.  All other systems reviewed and are negative.   Allergies  Levofloxacin; Medrol; Mycophenolate mofetil; Penicillins; Sulfonamide derivatives; Topiramate; and Verapamil  Home Medications   Prior to Admission medications   Medication Sig Start Date End Date Taking? Authorizing Provider  Biotin 10 MG CAPS Take 10 mg by mouth daily.    Historical Provider, MD  cycloSPORINE (RESTASIS) 0.05 % ophthalmic emulsion Place 1 drop into both eyes 2 (two) times daily as needed.     Historical Provider, MD  folic acid (FOLVITE) [id number] MCG tablet Take 400 mcg by mouth daily as needed.     Historical Provider, MD  hydrochlorothiazide 25 MG tablet Take 25 mg by mouth daily.      Historical Provider, MD  loratadine (CLARITIN) 10 MG tablet Take 10 mg by mouth daily.    Historical Provider, MD  Multiple Vitamins-Calcium (ONE-A-DAY WOMENS PO) Take 1 tablet by mouth daily.    Historical Provider, MD  pantoprazole (PROTONIX) 40 MG tablet Take 40 mg  by mouth 2 (two) times daily.     Historical Provider, MD  polyethylene glycol powder (MIRALAX) powder Take 17 g by mouth daily.      Historical Provider, MD  potassium chloride (MICRO-K) 10 MEQ CR capsule Take 1 tablet by mouth Daily. 11/14/11   Historical Provider, MD  ranitidine (ZANTAC) 150 MG tablet Take 1 tablet by mouth Twice daily. 10/07/10   Historical Provider, MD  Thiamine HCl (VITAMIN B-1) 250 MG tablet Take 250 mg by mouth daily.    Historical Provider, MD  triamcinolone cream (KENALOG) 0.1 % Apply 1 application topically 2 (two) times  daily.    Historical Provider, MD  vitamin A 8000 UNIT capsule Take 8,000 Units by mouth daily.    Historical Provider, MD  vitamin B-12 (CYANOCOBALAMIN) 1000 MCG tablet Take 1,000 mcg by mouth daily.    Historical Provider, MD   Triage Vitals: BP 158/98 mmHg  Temp(Src) 98.3 F (36.8 C) (Oral)  Resp 17  Ht 5' 1.5" (1.562 m)  Wt 137 lb (62.143 kg)  BMI 25.47 kg/m2  SpO2 99%   Physical Exam  CONSTITUTIONAL: Well developed/well nourished HEAD: Normocephalic/atraumatic EYES: EOMI/PERRL ENMT: Mucous membranes moist NECK: supple no meningeal signs CV: S1/S2 noted, no murmurs/rubs/gallops noted LUNGS: Lungs are clear to auscultation bilaterally, no apparent distress ABDOMEN: soft, nontender NEURO: Pt is awake/alert/appropriate, moves all extremitiesx4.  No facial droop. Left wrist drop. Weakness with left grip. No arm drift. No leg drift. No sensory deficit.  EXTREMITIES: pulses normal/equal, full ROM SKIN: warm, color normal PSYCH: no abnormalities of mood noted, alert and oriented to situation   ED Course  Procedures  DIAGNOSTIC STUDIES: Oxygen Saturation is 99% on RA, normal by my interpretation.   COORDINATION OF CARE: 11:22 AM- Will call code stroke and order CT scan. Pt verbalizes understanding and agrees to plan. 12:13 PM D/w dr Patrice Paradise with teleneuro He suspects this is radial nerve palsy after sitting in dentist chair 12:53 PM Pt declines TPA After neurology recommendations, will admit for stroke workup tPA in stroke considered but not given due to: Pt declines and neurologist dr Patrice Paradise does not recommend TPA 1:36 PM D/w dr Roderic Palau for admission for stroke workup   Labs Review Labs Reviewed  CBC - Abnormal; Notable for the following:    WBC 3.8 (*)    All other components within normal limits  COMPREHENSIVE METABOLIC PANEL - Abnormal; Notable for the following:    Sodium 131 (*)    Chloride 99 (*)    All other components within normal limits  I-STAT CHEM 8, ED -  Abnormal; Notable for the following:    Chloride 98 (*)    Calcium, Ion 1.32 (*)    Hemoglobin 15.3 (*)    All other components within normal limits  ETHANOL  PROTIME-INR  APTT  DIFFERENTIAL  URINE RAPID DRUG SCREEN, HOSP PERFORMED  URINALYSIS, ROUTINE W REFLEX MICROSCOPIC (NOT AT [facility])  TROPONIN I  I-STAT TROPOININ, ED    Imaging Review Ct Head Wo Contrast  10/15/2015  CLINICAL DATA:  Left arm numbness and difficulty grasping objects starting this morning 10:30 a.m., left hand weakness EXAM: CT HEAD WITHOUT CONTRAST TECHNIQUE: Contiguous axial images were obtained from the base of the skull through the vertex without intravenous contrast. COMPARISON:  None. FINDINGS: Brain: No intracranial hemorrhage, mass effect or midline shift. Mild cerebral atrophy. No definite acute cortical infarction. Mild periventricular and patchy subcortical white matter decreased attenuation probable due to chronic small vessel ischemic changes. No  mass lesion is noted on this unenhanced scan. Vascular: Mild atherosclerotic calcifications of carotid siphon. Skull: No skull fracture is noted. Sinuses/Orbits: Paranasal sinuses are unremarkable. There is fluid and partial opacification of right inferior mastoid air cells. Other: None IMPRESSION: No acute intracranial abnormality. Mild cerebral atrophy. Mild periventricular and patchy subcortical white matter decreased attenuation probable due to chronic small vessel ischemic changes. No definite acute cortical infarction. Electronically Signed   By: Lahoma Crocker M.D.   On: 10/15/2015 11:57   I have personally reviewed and evaluated these images and lab results as part of my medical decision-making.   EKG Interpretation   Date/Time:  Monday October 15 2015 11:40:00 EDT Ventricular Rate:  66 PR Interval:    QRS Duration: 110 QT Interval:  360 QTC Calculation: 378 R Axis:   -54 Text Interpretation:  Sinus rhythm Left anterior fascicular block Anterior  infarct, old  No previous ECGs available Confirmed by Christy Gentles  MD, Naija Troost  [phone]) on 10/15/2015 11:45:34 AM      MDM   Final diagnoses:  Left hand weakness    Nursing notes including past medical history and social history reviewed and considered in documentation Labs/vital reviewed myself and considered during evaluation   I personally performed the services described in this documentation, which was scribed in my presence. The recorded information has been reviewed and is accurate.      Ripley Fraise, MD 10/15/15 1336

## 2015-10-15 NOTE — H&P (Signed)
History and Physical    Peggy Ramirez [id number] DOB: 05-Dec-1933 DOA: 10/15/2015  Referring MD/NP/PA: Ripley Fraise, MD PCP: Manon Hilding, MD  Outpatient Specialists:  Patient coming from: home  Chief Complaint: left hand weakness  HPI: Peggy Ramirez is a 82 y.o. female with medical history significant of Sjogren's syndrome, membranous glomerulonephritis, presented with complaints of Left hand weakness, that onset one hour prior to admission. Patient reports that she was at her dentist and was in a dentist chair for quite some time. When she was finished with her dental cleaning, she noticed that she was unable to hold things in her left hand. She was repeatedly dropping objects. Her left hand also felt numb. She reports that strength in her left shoulder was normal. She denied any other limb weakness/numbness. Denied any changes in vision or slurred speech. She came to the ER for evaluation.    ED Course: UDS negative. CT head with out any acute disease. Sodium 131 and WBC 3.8. Case discussed between EDP and teleneuro who suspected that this may be radial nerve palsy after sitting in dentist chair. Per charts tPA was considered but patient denied and neurologist did not recommend. It was recommended by neurology that she be admitted for stroke workup.   Review of Systems: As per HPI otherwise 10 point review of systems negative.    Past Medical History  Diagnosis Date  . Chronic left lower quadrant pain   . Hydrosalpinx     LEFT  . Ovarian fibroma     RIGHT  . Pelvic adhesions   . Chronic constipation   . Membranous glomerulonephritis     followed by Dr. Elige Radon  . Sjogren's syndrome ([place])   . Hypertension   . High cholesterol   . Headache(784.0)   . Lupus [facility])     Past Surgical History  Procedure Laterality Date  . Total abdominal hysterectomy w/ bilateral salpingoophorectomy  2005  . Appendectomy  2002  . Laparoscopic cholecystectomy  2002  .  Tonsillectomy  1941  . Vaginal hysterectomy  1981     reports that she has never smoked. She has never used smokeless tobacco. She reports that she does not drink alcohol. Her drug history is not on file.  Allergies  Allergen Reactions  . Cellcept [Mycophenolate Mofetil]   . Levofloxacin     REACTION: rash  . Medrol [Methylprednisolone]   . Mycophenolate Mofetil   . Penicillins     REACTION: itching  . Sulfonamide Derivatives     REACTION: nausea  . Topiramate   . Verapamil     Felt shakey    Family History  Problem Relation Age of Onset  . Heart disease    . Anxiety disorder    . Depression       Prior to Admission medications   Medication Sig Start Date End Date Taking? Authorizing Provider  Biotin 10 MG CAPS Take 10 mg by mouth daily.   Yes Historical Provider, MD  Cholecalciferol (VITAMIN D-3 PO) Take 1 tablet by mouth daily.   Yes Historical Provider, MD  folic acid (FOLVITE) [id number] MCG tablet Take 400 mcg by mouth daily.   Yes Historical Provider, MD  hydrochlorothiazide 25 MG tablet Take 25 mg by mouth daily.     Yes Historical Provider, MD  loratadine (CLARITIN) 10 MG tablet Take 10 mg by mouth daily.   Yes Historical Provider, MD  LOTEMAX 0.5 % ophthalmic suspension Place 1 drop into both eyes daily. 10/11/15  Yes Historical Provider, MD  loteprednol (LOTEMAX) 0.2 % SUSP Place 1 drop into both eyes.   Yes Historical Provider, MD  Multiple Vitamins-Minerals (ZINC PO) Take 1 tablet by mouth daily.   Yes Historical Provider, MD  pantoprazole (PROTONIX) 40 MG tablet Take 40 mg by mouth 2 (two) times daily.    Yes Historical Provider, MD  polyethylene glycol powder (MIRALAX) powder Take 17 g by mouth daily.     Yes Historical Provider, MD  potassium chloride (MICRO-K) 10 MEQ CR capsule Take 1 tablet by mouth Daily. 11/14/11  Yes Historical Provider, MD  Pyridoxine HCl (VITAMIN B-6 PO) Take 1 tablet by mouth daily.   Yes Historical Provider, MD  ranitidine (ZANTAC) 150 MG  tablet Take 1 tablet by mouth Twice daily. 10/07/10  Yes Historical Provider, MD  Red Yeast Rice Extract (RED YEAST RICE PO) Take 1 tablet by mouth daily.   Yes Historical Provider, MD  Thiamine HCl (VITAMIN B-1) 250 MG tablet Take 250 mg by mouth daily.   Yes Historical Provider, MD  triamcinolone cream (KENALOG) 0.1 % Apply 1 application topically 2 (two) times daily.   Yes Historical Provider, MD  vitamin A 8000 UNIT capsule Take 8,000 Units by mouth daily.   Yes Historical Provider, MD  vitamin B-12 (CYANOCOBALAMIN) 1000 MCG tablet Take 1,000 mcg by mouth daily.   Yes Historical Provider, MD    Physical Exam: Filed Vitals:   10/15/15 1145 10/15/15 1200 10/15/15 1201 10/15/15 1215  BP: 153/79 157/77  150/75  Pulse: 67 68  66  Temp:   98.3 F (36.8 C)   TempSrc:      Resp: 13 16  16   Height:      Weight:      SpO2: 98% 100%  96%      Constitutional: NAD, calm, comfortable Filed Vitals:   10/15/15 1145 10/15/15 1200 10/15/15 1201 10/15/15 1215  BP: 153/79 157/77  150/75  Pulse: 67 68  66  Temp:   98.3 F (36.8 C)   TempSrc:      Resp: 13 16  16   Height:      Weight:      SpO2: 98% 100%  96%   Eyes: PERRL, lids and conjunctivae normal ENMT: Mucous membranes are moist. Posterior pharynx clear of any exudate or lesions.Normal dentition.  Neck: normal, supple, no masses, no thyromegaly Respiratory: clear to auscultation bilaterally, no wheezing, no crackles. Normal respiratory effort. No accessory muscle use.  Cardiovascular: Regular rate and rhythm, no murmurs / rubs / gallops. No extremity edema. 2+ pedal pulses. No carotid bruits.  Abdomen: no tenderness, no masses palpated. No hepatosplenomegaly. Bowel sounds positive.  Musculoskeletal: no clubbing / cyanosis. No joint deformity upper and lower extremities. Good ROM, no contractures. Normal muscle tone.  Skin: no rashes, lesions, ulcers. No induration Neurologic: CN 2-12 grossly intact. Sensation intact, DTR normal. Left  grip is 3-4/5. Remainder extremities 5/5  Psychiatric: Normal judgment and insight. Alert and oriented x 3. Normal mood.     Labs on Admission: I have personally reviewed following labs and imaging studies  CBC:  Recent Labs Lab 10/15/15 1122 10/15/15 1128  WBC 3.8*  --   NEUTROABS 2.2  --   HGB 14.1 15.3*  HCT 41.4 45.0  MCV 89.8  --   PLT 223  --    Basic Metabolic Panel:  Recent Labs Lab 10/15/15 1122 10/15/15 1128  NA 131* 136  K 3.7 3.8  CL 99* 98*  CO2 27  --  GLUCOSE 88 85  BUN 15 14  CREATININE 0.78 0.90  CALCIUM 10.2  --    GFR: Estimated Creatinine Clearance: 42.7 mL/min (by C-G formula based on Cr of 0.9). Liver Function Tests:  Recent Labs Lab 10/15/15 1122  AST 26  ALT 16  ALKPHOS 69  BILITOT 1.0  PROT 7.8  ALBUMIN 4.4   No results for input(s): LIPASE, AMYLASE in the last 168 hours. No results for input(s): AMMONIA in the last 168 hours. Coagulation Profile:  Recent Labs Lab 10/15/15 1122  INR 1.06   Cardiac Enzymes:  Recent Labs Lab 10/15/15 1122  TROPONINI <0.03    Radiological Exams on Admission: Ct Head Wo Contrast  10/15/2015  CLINICAL DATA:  Left arm numbness and difficulty grasping objects starting this morning 10:30 a.m., left hand weakness EXAM: CT HEAD WITHOUT CONTRAST TECHNIQUE: Contiguous axial images were obtained from the base of the skull through the vertex without intravenous contrast. COMPARISON:  None. FINDINGS: Brain: No intracranial hemorrhage, mass effect or midline shift. Mild cerebral atrophy. No definite acute cortical infarction. Mild periventricular and patchy subcortical white matter decreased attenuation probable due to chronic small vessel ischemic changes. No mass lesion is noted on this unenhanced scan. Vascular: Mild atherosclerotic calcifications of carotid siphon. Skull: No skull fracture is noted. Sinuses/Orbits: Paranasal sinuses are unremarkable. There is fluid and partial opacification of right  inferior mastoid air cells. Other: None IMPRESSION: No acute intracranial abnormality. Mild cerebral atrophy. Mild periventricular and patchy subcortical white matter decreased attenuation probable due to chronic small vessel ischemic changes. No definite acute cortical infarction. Electronically Signed   By: Lahoma Crocker M.D.   On: 10/15/2015 11:57    EKG: Independently reviewed. Sinus rhythm without acute changes  Assessment/Plan Active Problems:   * No active hospital problems. *  1. Left hand weakness. Possibly related to radial nerve palsy. She is being admitted for further stroke workup per neurology recommendations.She will get MRI/MRA  of head. Will consult neurology in the hospital to further evaluate. She'll be continued on aspirin. Will check hemoglobin A1c and lipid panel. Further orders per stroke order set.  2. GERD. Continue PPI and H2 blockers.   DVT prophylaxis: lovenox Code Status: full code Family Communication: discussed with patient Disposition Plan: discharge home once improved Consults called: neurology Admission status: observation, telemetry   Kathie Dike, MD  [facility] Pager [phone]  If 7PM-7AM, please contact night-coverage [email or web address] Password [email or web address]  10/15/2015, 1:01 PM

## 2015-10-16 ENCOUNTER — Observation Stay (HOSPITAL_COMMUNITY): Payer: PPO

## 2015-10-16 ENCOUNTER — Observation Stay (HOSPITAL_BASED_OUTPATIENT_CLINIC_OR_DEPARTMENT_OTHER): Payer: PPO

## 2015-10-16 DIAGNOSIS — E785 Hyperlipidemia, unspecified: Secondary | ICD-10-CM

## 2015-10-16 DIAGNOSIS — K219 Gastro-esophageal reflux disease without esophagitis: Secondary | ICD-10-CM | POA: Diagnosis not present

## 2015-10-16 DIAGNOSIS — I6602 Occlusion and stenosis of left middle cerebral artery: Secondary | ICD-10-CM | POA: Diagnosis not present

## 2015-10-16 DIAGNOSIS — I639 Cerebral infarction, unspecified: Secondary | ICD-10-CM | POA: Diagnosis not present

## 2015-10-16 DIAGNOSIS — I10 Essential (primary) hypertension: Secondary | ICD-10-CM

## 2015-10-16 DIAGNOSIS — R531 Weakness: Secondary | ICD-10-CM | POA: Diagnosis not present

## 2015-10-16 DIAGNOSIS — G8194 Hemiplegia, unspecified affecting left nondominant side: Secondary | ICD-10-CM | POA: Diagnosis not present

## 2015-10-16 DIAGNOSIS — I63233 Cerebral infarction due to unspecified occlusion or stenosis of bilateral carotid arteries: Secondary | ICD-10-CM | POA: Diagnosis not present

## 2015-10-16 DIAGNOSIS — Z9049 Acquired absence of other specified parts of digestive tract: Secondary | ICD-10-CM | POA: Diagnosis not present

## 2015-10-16 DIAGNOSIS — G459 Transient cerebral ischemic attack, unspecified: Secondary | ICD-10-CM

## 2015-10-16 DIAGNOSIS — M3500 Sicca syndrome, unspecified: Secondary | ICD-10-CM | POA: Diagnosis not present

## 2015-10-16 DIAGNOSIS — Z818 Family history of other mental and behavioral disorders: Secondary | ICD-10-CM | POA: Diagnosis not present

## 2015-10-16 DIAGNOSIS — E7800 Pure hypercholesterolemia, unspecified: Secondary | ICD-10-CM | POA: Diagnosis not present

## 2015-10-16 DIAGNOSIS — R270 Ataxia, unspecified: Secondary | ICD-10-CM | POA: Diagnosis not present

## 2015-10-16 DIAGNOSIS — M6289 Other specified disorders of muscle: Secondary | ICD-10-CM | POA: Diagnosis not present

## 2015-10-16 DIAGNOSIS — Z90710 Acquired absence of both cervix and uterus: Secondary | ICD-10-CM | POA: Diagnosis not present

## 2015-10-16 LAB — ECHOCARDIOGRAM COMPLETE
CHL CUP MV DEC (S): 282
E/e' ratio: 10.59
EWDT: 282 ms
FS: 33 % (ref 28–44)
Height: 61 in
IV/PV OW: 1.15
LA diam end sys: 27 mm
LADIAMINDEX: 1.67 cm/m2
LASIZE: 27 mm
LAVOL: 15.2 mL
LAVOLA4C: 14 mL
LAVOLIN: 9.4 mL/m2
LDCA: 2.84 cm2
LV E/e' medial: 10.59
LV E/e'average: 10.59
LV SIMPSON'S DISK: 64
LVDIAVOL: 27 mL — AB (ref 46–106)
LVDIAVOLIN: 17 mL/m2
LVELAT: 7.83 cm/s
LVOT diameter: 19 mm
LVSYSVOL: 10 mL — AB (ref 14–42)
LVSYSVOLIN: 6 mL/m2
MV pk A vel: 89 m/s
MVPG: 3 mmHg
MVPKEVEL: 82.9 m/s
PW: 10.2 mm — AB (ref 0.6–1.1)
RV TAPSE: 20.4 mm
Reg peak vel: 209 cm/s
Stroke v: 17 ml
TDI e' lateral: 7.83
TDI e' medial: 5.55
TR max vel: 209 cm/s
Weight: 2222.24 oz

## 2015-10-16 LAB — LIPID PANEL
CHOL/HDL RATIO: 4.2 ratio
CHOLESTEROL: 163 mg/dL (ref 0–200)
HDL: 39 mg/dL — ABNORMAL LOW (ref >40)
LDL CALC: 93 mg/dL (ref 0–99)
TRIGLYCERIDES: 156 mg/dL — AB (ref <150)
VLDL: 31 mg/dL (ref 0–40)

## 2015-10-16 MED ORDER — ATORVASTATIN CALCIUM 10 MG PO TABS: 10.0000 mg | ORAL_TABLET | Freq: Every day | ORAL | Status: DC

## 2015-10-16 MED ORDER — LORATADINE 10 MG PO TABS
10.0000 mg | ORAL_TABLET | Freq: Every day | ORAL | Status: DC
  Administered 2015-10-16: 10 mg via ORAL
  Filled 2015-10-16: qty 1

## 2015-10-16 MED ORDER — ASPIRIN EC 325 MG PO TBEC: 325.0000 mg | DELAYED_RELEASE_TABLET | Freq: Every day | ORAL | Status: DC

## 2015-10-16 NOTE — Evaluation (Signed)
Occupational Therapy Evaluation Patient Details Name: Peggy Ramirez MRN: [id number] DOB: 1933/10/13 Today's Date: 10/16/2015    History of Present Illness Peggy Ramirez is a 82 y.o. female with medical history significant of Sjogren's syndrome, membranous glomerulonephritis, presented with complaints of Left hand weakness, that onset one hour prior to admission. Patient reports that she was at her dentist and was in a dentist chair for quite some time. When she was finished with her dental cleaning, she noticed that she was unable to hold things in her left hand. She was repeatedly dropping objects. Her left hand also felt numb. She reports that strength in her left shoulder was normal. She denied any other limb weakness/numbness. MRI results: There are several scattered embolic appearing cortical base signal seen on diffusion imaging involving the right posterior frontal area consistent with acute infarcts. There is reduced signal seen on the ADC scan corresponding areas again consistent with acute infarcts.There are scattered non-confluent white matter signal seen on diffusion imaging consistent with chronic infarcts.   Clinical Impression   Pt awake, alert, oriented x4 this am, agreeable to OT evaluation. Pt reports LUE numbness has improved, does experienced tingling in fingertips. Pt reports minimal soreness in left forearm. Pt elbow, forearm, and wrist strength is WFL, pt notes improvement from yesterday as she "can hold my hand and fingers up now." Pt sensation is intact, deficits noted in fine motor coordination. Pt unable to perform functional grasp, is able to complete opposition to all fingers with exception of small finger. Educated pt on fine motor exercises and grip strengthening exercises to complete, pt verbalized understanding. Recommend outpatient OT on discharge to continue working to increase grip and pinch strengthening, and fine motor coordination to improve ability to  complete functional tasks with LUE. No further acute OT services required.     Follow Up Recommendations  Outpatient OT    Equipment Recommendations  None recommended by OT       Precautions / Restrictions Precautions Precautions: None Restrictions Weight Bearing Restrictions: No      Mobility Bed Mobility Overal bed mobility: Modified Independent                        ADL Overall ADL's : Modified independent                                       General ADL Comments: Increased time needed to complete ADL tasks due to decreased coordination when using LUE. Pt able to use LUE as assist during ADL tasks     Vision Vision Assessment?: No apparent visual deficits          Pertinent Vitals/Pain Pain Assessment: No/denies pain     Hand Dominance Right   Extremity/Trunk Assessment Upper Extremity Assessment Upper Extremity Assessment: LUE deficits/detail LUE Deficits / Details: Deficits noted in LUE grip strength and fine motor coordination. Pt elbow, forearm, and wrist strength WFL LUE Coordination: decreased fine motor   Lower Extremity Assessment Lower Extremity Assessment: Defer to PT evaluation       Communication Communication Communication: No difficulties   Cognition Arousal/Alertness: Awake/alert Behavior During Therapy: WFL for tasks assessed/performed Overall Cognitive Status: Within Functional Limits for tasks assessed  Home Living Family/patient expects to be discharged to:: Private residence Living Arrangements: Alone Available Help at Discharge: Family;Available PRN/intermittently Type of Home: House Home Access: Stairs to enter Entrance Stairs-Number of Steps: 1 + 1    Home Layout: One level     Bathroom Shower/Tub: Teacher, early years/pre: Standard     Home Equipment: None          Prior Functioning/Environment Level of Independence: Independent   Gait / Transfers Assistance Needed: ambulates without any DME without difficulty.   ADL's / Homemaking Assistance Needed: Independent with ADL's, and IADL's, driving.        OT Diagnosis: Hemiplegia non-dominant side (left hand only)   OT Problem List: Decreased strength;Decreased activity tolerance;Impaired UE functional use    End of Session    Activity Tolerance: Patient tolerated treatment well Patient left: in bed;with call bell/phone within reach;with nursing/sitter in room   Time: [id number] OT Time Calculation (min): 20 min Charges:  OT General Charges $OT Visit: 1 Procedure OT Evaluation $OT Eval Low Complexity: 1 Procedure G-Codes: OT G-codes **NOT FOR INPATIENT CLASS** Functional Assessment Tool Used: clinical judgement Functional Limitation: Self care Self Care Current Status [id number]): At least 20 percent but less than 40 percent impaired, limited or restricted Self Care Goal Status [id number]): At least 20 percent but less than 40 percent impaired, limited or restricted Self Care Discharge Status [phone]): At least 20 percent but less than 40 percent impaired, limited or restricted  Guadelupe Sabin, OTR/L  [phone]  10/16/2015, 11:25 AM

## 2015-10-16 NOTE — Discharge Summary (Addendum)
Physician Discharge Summary  Peggy Ramirez [id number] DOB: April 12, 1934 DOA: 10/15/2015  PCP: Manon Hilding, MD  Admit date: 10/15/2015 Discharge date: 10/16/2015  Time spent: 35 minutes  Recommendations for Outpatient Follow-up:  1. Follow up with PCP in 1-2 weeks   Discharge Diagnoses:  Active Problems:   Left hand weakness   Acute CVA (cerebrovascular accident) ([place]) Hyperlipidemia Essential HTN  Discharge Condition: improved  Diet recommendation: heart healthy  Filed Weights   10/15/15 1124 10/15/15 1508  Weight: 62.143 kg (137 lb) 63 kg (138 lb 14.2 oz)    History of present illness:  Peggy Ramirez is a 82 y.o. female with medical history significant of Sjogren's syndrome, membranous glomerulonephritis, presented with complaints of Left hand weakness, that onset one hour prior to admission. Patient reported that she was at her dentist and was in a dentist chair for quite some time. When she was finished with her dental cleaning, she noticed that she was unable to hold things in her left hand. She was repeatedly dropping objects. Her left hand also felt numb. She reported that strength in her left shoulder was normal. She denied any other limb weakness/numbness. Denied any changes in vision or slurred speech. She came to the ER for evaluation.   Hospital Course:  Patient presented complaining of left hand weakness. While being evaluated in the ED, the case was discussed with teleneuro who suspected her symptoms stemmed from radial nerve palsy which resulted from sitting in the dentist chair for an extended period of time. tPA was considered initially as a treatment option, however, the patient did not wish to pursue this course and neurology recommended against it as well. Instead she was started on aspirin therapy and admitted for further stroke workup. An MRI brain was performed and revealed a small acute cortically based infarcts in the posterior right MCA and several  superimposed punctate infarcts. MRA head did not show any severe/critical stenosis. Carotid dopplers showed <50% stenosis bilaterally and echocardiogram was unremarkable. Per Neurology, she will be referred for a 30 day event monitor to evaluate for any underlying A fib. LDL was noted to be mildly above goal at 93, so she was started on low dose lipitor. She will be continued on a daily aspirin. She was evaluated by PT who felt that no PT follow up was necessary upon discharge. However, she may need an outpatient OT follow up with regards to fine motor skills of her left hand. She is otherwise stable for discharge  Procedures:  none  Consultations:  Neurology  Discharge Exam: Filed Vitals:   10/16/15 1000 10/16/15 1316  BP: 136/64 134/68  Pulse: 67 64  Temp: 98.7 F (37.1 C) 98.6 F (37 C)  Resp: 18 20    Examination: General exam: Appears calm and comfortable  Respiratory system: Clear to auscultation. Respiratory effort normal. Cardiovascular system: S1 & S2 heard, RRR. No JVD, murmurs, rubs, gallops or clicks. No pedal edema. Gastrointestinal system: Abdomen is nondistended, soft and nontender. No organomegaly or masses felt. Normal bowel sounds heard. Central nervous system: Alert and oriented. Left hand grip is 3/5, otherwise remainder of extremities is 5/5 Extremities: Symmetric 5 x 5 power, other than left hand Skin: No rashes, lesions or ulcers Psychiatry: Judgement and insight appear normal. Mood & affect appropriate.    Discharge Instructions   Discharge Instructions    Diet - low sodium heart healthy    Complete by:  As directed      Increase activity slowly  Complete by:  As directed           Current Discharge Medication List    START taking these medications   Details  aspirin EC 325 MG tablet Take 1 tablet (325 mg total) by mouth daily. Qty: 30 tablet, Refills: 1    atorvastatin (LIPITOR) 10 MG tablet Take 1 tablet (10 mg total) by mouth daily. Qty:  30 tablet, Refills: 1      CONTINUE these medications which have NOT CHANGED   Details  Biotin 10 MG CAPS Take 10 mg by mouth daily.    Cholecalciferol (VITAMIN D-3 PO) Take 1 tablet by mouth daily.    folic acid (FOLVITE) A999333 MCG tablet Take 400 mcg by mouth daily.    hydrochlorothiazide 25 MG tablet Take 25 mg by mouth daily.      loratadine (CLARITIN) 10 MG tablet Take 10 mg by mouth daily.    LOTEMAX 0.5 % ophthalmic suspension Place 1 drop into both eyes daily.    loteprednol (LOTEMAX) 0.2 % SUSP Place 1 drop into both eyes.    Multiple Vitamins-Minerals (ZINC PO) Take 1 tablet by mouth daily.    pantoprazole (PROTONIX) 40 MG tablet Take 40 mg by mouth 2 (two) times daily.     polyethylene glycol powder (MIRALAX) powder Take 17 g by mouth daily.      potassium chloride (MICRO-K) 10 MEQ CR capsule Take 1 tablet by mouth Daily.    Pyridoxine HCl (VITAMIN B-6 PO) Take 1 tablet by mouth daily.    ranitidine (ZANTAC) 150 MG tablet Take 1 tablet by mouth Twice daily.    Red Yeast Rice Extract (RED YEAST RICE PO) Take 1 tablet by mouth daily.    Thiamine HCl (VITAMIN B-1) 250 MG tablet Take 250 mg by mouth daily.    triamcinolone cream (KENALOG) 0.1 % Apply 1 application topically 2 (two) times daily.    vitamin A 8000 UNIT capsule Take 8,000 Units by mouth daily.    vitamin B-12 (CYANOCOBALAMIN) 1000 MCG tablet Take 1,000 mcg by mouth daily.       Allergies  Allergen Reactions  . Cellcept [Mycophenolate Mofetil]   . Levofloxacin     REACTION: rash  . Medrol [Methylprednisolone]   . Mycophenolate Mofetil   . Penicillins     REACTION: itching  . Sulfonamide Derivatives     REACTION: nausea  . Topiramate   . Verapamil     Felt shakey      The results of significant diagnostics from this hospitalization (including imaging, microbiology, ancillary and laboratory) are listed below for reference.    Significant Diagnostic Studies: Ct Head Wo  Contrast  10/15/2015  CLINICAL DATA:  Left arm numbness and difficulty grasping objects starting this morning 10:30 a.m., left hand weakness EXAM: CT HEAD WITHOUT CONTRAST TECHNIQUE: Contiguous axial images were obtained from the base of the skull through the vertex without intravenous contrast. COMPARISON:  None. FINDINGS: Brain: No intracranial hemorrhage, mass effect or midline shift. Mild cerebral atrophy. No definite acute cortical infarction. Mild periventricular and patchy subcortical white matter decreased attenuation probable due to chronic small vessel ischemic changes. No mass lesion is noted on this unenhanced scan. Vascular: Mild atherosclerotic calcifications of carotid siphon. Skull: No skull fracture is noted. Sinuses/Orbits: Paranasal sinuses are unremarkable. There is fluid and partial opacification of right inferior mastoid air cells. Other: None IMPRESSION: No acute intracranial abnormality. Mild cerebral atrophy. Mild periventricular and patchy subcortical white matter decreased attenuation probable due to chronic  small vessel ischemic changes. No definite acute cortical infarction. Electronically Signed   By: Lahoma Crocker M.D.   On: 10/15/2015 11:57   Mr Virgel Paling Wo Contrast  10/16/2015  CLINICAL DATA:  82 year old female with left side weakness and difficulty walking for 1 day. Initial encounter. EXAM: MRA HEAD WITHOUT CONTRAST TECHNIQUE: Angiographic images of the Circle of Willis were obtained using MRA technique without intravenous contrast. COMPARISON:  Brain MRI from today reported separately. FINDINGS: Antegrade flow in the posterior circulation with fairly codominant distal vertebral arteries. No distal vertebral stenosis. Patent vertebrobasilar junction. Patent basilar artery with tortuosity but no stenosis. Normal SCA and PCA origins. Posterior communicating arteries are diminutive or absent. Bilateral PCA branches are within normal limits. Antegrade flow in both ICA siphons. ICA  flow signal appears symmetric. Mild siphon irregularity and tortuosity but no ICA stenosis. Normal ophthalmic artery origins. Patent carotid termini. There is mild to moderate stenosis at the left MCA origin (series 109, image 21). Otherwise normal left MCA M1 segment. Patent left MCA bifurcation and no left MCA branch occlusion identified. Normal ACA origins. Diminutive or absent anterior communicating artery. Visualized ACA branches are normal. Right MCA origin, M1 segment and bifurcation are within normal limits. No MCA branch occlusion is identified. IMPRESSION: 1. Negative for emergent large vessel occlusion, and no MRA abnormality corresponding to the acute right hemisphere ischemia. 2. Mild to moderate left MCA origins stenosis. Electronically Signed   By: Genevie Ann M.D.   On: 10/16/2015 08:07   Mr Brain Wo Contrast  10/16/2015  CLINICAL DATA:  82 year old female with left side weakness and difficulty walking for 1 day. Initial encounter. EXAM: MRI HEAD WITHOUT CONTRAST TECHNIQUE: Multiplanar, multiecho pulse sequences of the brain and surrounding structures were obtained without intravenous contrast. COMPARISON:  Head CT without contrast 10/15/2015. FINDINGS: Multiple small areas of cortically based restricted diffusion affecting the superior right motor strip as well as a small area of the right postcentral gyrus. Minimal associated T2 and FLAIR hyperintensity. No acute hemorrhage or mass effect. There are several superimposed punctate foci of restricted diffusion in the posterior right temporal and occipital lobes (series 101, image 17). No contralateral or posterior fossa restricted diffusion. Major intracranial vascular flow voids are preserved. No midline shift, mass effect, evidence of mass lesion, ventriculomegaly, extra-axial collection or acute intracranial hemorrhage. Cervicomedullary junction and pituitary are within normal limits. There is chronic hemorrhage along some of the surfaces of the  left hemisphere (superficial siderosis, series 9 images 15 through 18). There may also be a punctate chronic micro hemorrhage in the posterior right temporal lobe on series 9, image 16. In addition to the acute findings there is patchy in scattered bilateral cerebral white matter T2 and FLAIR hyperintensity in a nonspecific configuration. No cortical encephalomalacia identified. Deep gray matter nuclei, brainstem, and cerebellum are normal. Stable and well pneumatized visualized paranasal sinuses and mastoids. Negative orbit and scalp soft tissues. Negative for age visualized cervical spine. IMPRESSION: 1. Small acute cortically based infarcts in the posterior right MCA territory, most affecting the motor strip. Several superimposed punctate infarcts in the posterior right temporal and occipital lobes. 2. No associated acute hemorrhage or mass effect. 3. 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 Moderate for age nonspecific bilateral white matter signal changes. Electronically Signed   By: Genevie Ann M.D.   On: 10/16/2015 08:04   US Carotid Bilateral  10/16/2015  CLINICAL DATA:  Left hand numbness. Acute right MCA territory cerebral infarction. EXAM: BILATERAL CAROTID  DUPLEX ULTRASOUND TECHNIQUE: Pearline Cables scale imaging, color Doppler and duplex ultrasound were performed of bilateral carotid and vertebral arteries in the neck. COMPARISON:  None. FINDINGS: Criteria: Quantification of carotid stenosis is based on velocity parameters that correlate the residual internal carotid diameter with NASCET-based stenosis levels, using the diameter of the distal internal carotid lumen as the denominator for stenosis measurement. The following velocity measurements were obtained: RIGHT ICA:  126/36 distal, 103/32 mid cm/sec CCA:  0000000 cm/sec SYSTOLIC ICA/CCA RATIO:  1.8 DIASTOLIC ICA/CCA RATIO:  2.3 ECA:  57 cm/sec LEFT ICA:  80/28 cm/sec CCA:  [id number] cm/sec SYSTOLIC ICA/CCA RATIO:  1.3 DIASTOLIC ICA/CCA RATIO:  1.8 ECA:  82  cm/sec RIGHT CAROTID ARTERY: Mild amount of calcified plaque is present at the level of the carotid bulb and proximal ICA. Proximal ICA velocities are normal and estimated right ICA stenosis is less than 50%. RIGHT VERTEBRAL ARTERY: Antegrade flow with normal waveform and velocity. LEFT CAROTID ARTERY: Mild amount of partially calcified plaque present at the level of the distal common carotid artery, carotid bulb and proximal ICA. Left ICA velocities and waveforms are normal. Estimated left ICA stenosis is less than 50%. LEFT VERTEBRAL ARTERY: Antegrade flow with normal waveform and velocity. IMPRESSION: Mild amount of plaque at both carotid bifurcations, as above. Estimated bilateral ICA stenoses are less than 50%. Electronically Signed   By: Aletta Edouard M.D.   On: 10/16/2015 09:11    Microbiology: No results found for this or any previous visit (from the past 240 hour(s)).   Labs: Basic Metabolic Panel:  Recent Labs Lab 10/15/15 1122 10/15/15 1128  NA 131* 136  K 3.7 3.8  CL 99* 98*  CO2 27  --   GLUCOSE 88 85  BUN 15 14  CREATININE 0.78 0.90  CALCIUM 10.2  --    Liver Function Tests:  Recent Labs Lab 10/15/15 1122  AST 26  ALT 16  ALKPHOS 69  BILITOT 1.0  PROT 7.8  ALBUMIN 4.4   No results for input(s): LIPASE, AMYLASE in the last 168 hours. No results for input(s): AMMONIA in the last 168 hours. CBC:  Recent Labs Lab 10/15/15 1122 10/15/15 1128  WBC 3.8*  --   NEUTROABS 2.2  --   HGB 14.1 15.3*  HCT 41.4 45.0  MCV 89.8  --   PLT 223  --    Cardiac Enzymes:  Recent Labs Lab 10/15/15 1122  TROPONINI <0.03   Signed: Kathie Dike MD.  [facility] 10/16/2015, 4:56 PM

## 2015-10-16 NOTE — Evaluation (Signed)
Physical Therapy Evaluation Patient Details Name: Peggy Ramirez MRN: [id number] DOB: [birth year] Today's Date: 10/16/2015   History of Present Illness  82 yo F admitted with L handed weakness. MRI shows small acute cortically based infarcts in the posterior R MCA territory, most affecting the motor strip. Several superimposed punctate infarcts in the posterior R temporal and occipital lobes. 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 Moderate for age nonspecific bilateral white matter signal changes.  PMH: Sjogren's syndrome, membranous glomerulonephritis, chronic L Lower quadrant pain, hydrosalpinx, ovarian fibroma, pelvic adhesions, chronic constipation, HTN, lupus, appendectomy.   Clinical Impression  Pt received sitting up on the EOB, just finished with bathing, and pt is agreeable to PT evaluation.  Pt lives alone, and is normally independent with all aspects of ADL's, and daily activities.  Today, during PT evaluation she ambulate 561ft independently with no LOB and good cadence.  She negotiated 10 steps at Mod (I) level - holding the R railing via reciprocal pattern.  Only deficit from acute CVA seems to be fine motor of the L hand.  Recommended f/u with outpatient OT regarding this, as pt is an avid card player.  No further PT needs at this time, will sign off.     Follow Up Recommendations No PT follow up;Other (comment) (Pt may need outpatient OT for f/u with regards to fine motor of L hand. )    Equipment Recommendations  None recommended by PT    Recommendations for Other Services       Precautions / Restrictions Precautions Precautions: None Restrictions Weight Bearing Restrictions: No      Mobility  Bed Mobility Overal bed mobility: Modified Independent                Transfers Overall transfer level: Modified independent                  Ambulation/Gait Ambulation/Gait assistance: Independent Ambulation Distance (Feet): 500 Feet Assistive  device: None Gait Pattern/deviations: WFL(Within Functional Limits)        Stairs Stairs: Yes Stairs assistance: Modified independent (Device/Increase time) Stair Management: One rail Right;Alternating pattern Number of Stairs: 10    Wheelchair Mobility    Modified Rankin (Stroke Patients Only)       Balance Overall balance assessment: No apparent balance deficits (not formally assessed)                                           Pertinent Vitals/Pain Pain Assessment: No/denies pain    Home Living Family/patient expects to be discharged to:: Private residence Living Arrangements: Alone Available Help at Discharge: Family;Available PRN/intermittently Type of Home: House Home Access: Stairs to enter   Entrance Stairs-Number of Steps: 1 + 1  Home Layout: One level Home Equipment: None      Prior Function Level of Independence: Independent   Gait / Transfers Assistance Needed: ambulates without any DME without difficulty.    ADL's / Homemaking Assistance Needed: Independent with ADL's, and IADL's, driving.        Hand Dominance   Dominant Hand: Right    Extremity/Trunk Assessment   Upper Extremity Assessment: Defer to OT evaluation       LUE Deficits / Details: Deficits noted in LUE grip strength and fine motor coordination. Pt elbow, forearm, and wrist strength WFL   Lower Extremity Assessment: Overall WFL for tasks assessed  Communication   Communication: No difficulties  Cognition Arousal/Alertness: Awake/alert Behavior During Therapy: WFL for tasks assessed/performed Overall Cognitive Status: Within Functional Limits for tasks assessed                      General Comments      Exercises        Assessment/Plan    PT Assessment Patent does not need any further PT services  PT Diagnosis Difficulty walking   PT Problem List    PT Treatment Interventions     PT Goals (Current goals can be found in the  Care Plan section) Acute Rehab PT Goals PT Goal Formulation: All assessment and education complete, DC therapy    Frequency     Barriers to discharge        Co-evaluation               End of Session Equipment Utilized During Treatment: Gait belt Activity Tolerance: Patient tolerated treatment well Patient left: in chair;with call bell/phone within reach      Functional Assessment Tool Used: [employer] "6-clicks"  Functional Limitation: Mobility: Walking and moving around Mobility: Walking and Moving Around Current Status [phone]): 0 percent impaired, limited or restricted Mobility: Walking and Moving Around Goal Status [phone]): 0 percent impaired, limited or restricted Mobility: Walking and Moving Around Discharge Status [phone]): 0 percent impaired, limited or restricted    Time: 1004-1027 PT Time Calculation (min) (ACUTE ONLY): 23 min   Charges:   PT Evaluation $PT Eval Low Complexity: 1 Procedure PT Treatments $Gait Training: 8-22 mins   PT G Codes:   PT G-Codes **NOT FOR INPATIENT CLASS** Functional Assessment Tool Used: [employer] "6-clicks"  Functional Limitation: Mobility: Walking and moving around Mobility: Walking and Moving Around Current Status [phone]): 0 percent impaired, limited or restricted Mobility: Walking and Moving Around Goal Status [phone]): 0 percent impaired, limited or restricted Mobility: Walking and Moving Around Discharge Status [phone]): 0 percent impaired, limited or restricted    Beth Dana Dorner, PT, DPT X: [id number]

## 2015-10-16 NOTE — Care Management Note (Addendum)
Case Management Note  Patient Details  Name: Peggy Ramirez MRN: [id number] Date of Birth: 12-03-1933  Subjective/Objective: Patient is alert and making her bed when I entered room. Patient is from home alone. Has family support. Her son helps her out with chores around the house. She is otherwise independent with ADL's. She drives herself to appointments. Her PCP is Dr. Quintin Alto and her cardiologist is Dr. Harl Bowie. She reports no issues with obtaining her medications.            Action/Plan: No CM needs identified at this time.   Expected Discharge Date:       10/17/2015           Expected Discharge Plan:  Home/Self Care  In-House Referral:     Discharge planning Services  CM Consult  Post Acute Care Choice:  NA Choice offered to:  NA  DME Arranged:    DME Agency:     HH Arranged:    HH Agency:     Status of Service:  Completed, signed off  If discussed at [employer] of Stay Meetings, dates discussed:    Additional Comments:  Peggy Ramirez, Chauncey Reading, RN 10/16/2015, 1:52 PM

## 2015-10-16 NOTE — Consult Note (Signed)
[place] A. Merlene Laughter, MD     [email or web address]          Peggy Ramirez is an 82 y.o. female.   ASSESSMENT/PLAN: 1. Acute right frontal infarct. The character on MRI finding is suspicious for embolic either artery to artery embolism or cardiac embolism. Risk factors age and hypertension.  RECOMMENDATION: Follow-up echocardiography Follow-up carotid duplex Doppler and brain MRA. Agree with antiplatelet agent aspirin. Occupational and physical therapy. Other routine workup including hemoglobin A1c and lipid panel. We may consider 30 day event monitor to evaluate for atrial fibrillation.  The patient 82 year old right handed white female who developed the acute onset of weakness and numbness involving the left hand and forearm approximately 10 AM yesterday morning. The patient had just had a dental procedure done. She had her teeth cleaned. She went to the car and contacted her daughter. The patient was given aspirin by the dental staff. She previously was not taking any antiplatelet agents as she had no reasons to take them. The patient does have a history of hypertension. The patient does not report having symptoms involving the left lower extremity. She actually had rather profound left hand weakness with some numbness. She denies any symptoms involving the facial region. She denies chest pain or shortness of breath. She denied dysarthria, dysphagia or dizziness. She has had some chronic right-sided chest pain which she thinks is common from acid reflux problems. The patient was taken to the emergency room right after her's daughter came there in about 15 minutes. 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 The patient reports that since being hospitalized her symptoms have improved. She still reports significant dexterity issues involving the left hand. The numbness has also  improved significantly. The review systems otherwise negative.  GENERAL: This is a very pleasant average weight lady in no acute distress.  HEENT: Normal.  ABDOMEN: soft  EXTREMITIES: No edema   BACK: Normal.  SKIN: Normal by inspection.    MENTAL STATUS: Alert and oriented. The patient states the current month and her age appropriately. There is no neglect appreciated. Speech, language and cognition are generally intact. Judgment and insight normal.   CRANIAL NERVES: Pupils are equal, round and reactive to light and accomodation; extra ocular movements are full, there is no significant nystagmus; visual fields are full; upper and lower facial muscles are normal in strength and symmetric, there is no flattening of the nasolabial folds; tongue is midline; uvula is midline; shoulder elevation is normal.  MOTOR: Deltoids are weak bilaterally 4+/5. Left upper extremity strength including deltoids, triceps and wrist extension are 4/5. Hand grip is quite weak but 3/5. There is markedly impaired hand dexterity and finger tapping of the left hand. There is mild the pronator left upper extremity. There is also mild drift left leg. Left lower extremity strength 5. Bulk and tone are also 5. Right lower extremity 5. No drift on the right side.  COORDINATION: Left finger to nose is normal, right finger to nose is normal, No rest tremor; no intention tremor; no postural tremor; no bradykinesia.  REFLEXES: Deep tendon reflexes are symmetrical and normal. Babinski reflexes are flexor bilaterally.   SENSATION: There is slightly reduced sensation to light touch and temperature left middle fingers. No extinction to double simultaneous tactile visual stimulation.  GAIT: Normal.    NIH stroke scale 3.   Blood pressure 137/68, pulse 64, temperature 98.1  F (36.7 C), temperature source Oral, resp. rate 18, height 5' 1"  (1.549 m), weight 138 lb 14.2 oz (63 kg), SpO2 98 %.  Past Medical History  Diagnosis  Date  . Chronic left lower quadrant pain   . Hydrosalpinx     LEFT  . Ovarian fibroma     RIGHT  . Pelvic adhesions   . Chronic constipation   . Membranous glomerulonephritis     followed by Dr. Elige Radon  . Sjogren's syndrome ([place])   . Hypertension   . High cholesterol   . Headache(784.0)   . Lupus [facility])     Past Surgical History  Procedure Laterality Date  . Total abdominal hysterectomy w/ bilateral salpingoophorectomy  2005  . Appendectomy  2002  . Laparoscopic cholecystectomy  2002  . Tonsillectomy  1941  . Vaginal hysterectomy  1981    Family History  Problem Relation Age of Onset  . Heart disease    . Anxiety disorder    . Depression      Social History:  reports that she has never smoked. She has never used smokeless tobacco. She reports that she does not drink alcohol. Her drug history is not on file.  Allergies:  Allergies  Allergen Reactions  . Cellcept [Mycophenolate Mofetil]   . Levofloxacin     REACTION: rash  . Medrol [Methylprednisolone]   . Mycophenolate Mofetil   . Penicillins     REACTION: itching  . Sulfonamide Derivatives     REACTION: nausea  . Topiramate   . Verapamil     Felt shakey    Medications: Prior to Admission medications   Medication Sig Start Date End Date Taking? Authorizing Provider  Biotin 10 MG CAPS Take 10 mg by mouth daily.   Yes Historical Provider, MD  Cholecalciferol (VITAMIN D-3 PO) Take 1 tablet by mouth daily.   Yes Historical Provider, MD  folic acid (FOLVITE) 449 MCG tablet Take 400 mcg by mouth daily.   Yes Historical Provider, MD  hydrochlorothiazide 25 MG tablet Take 25 mg by mouth daily.     Yes Historical Provider, MD  loratadine (CLARITIN) 10 MG tablet Take 10 mg by mouth daily.   Yes Historical Provider, MD  LOTEMAX 0.5 % ophthalmic suspension Place 1 drop into both eyes daily. 10/11/15  Yes Historical Provider, MD  loteprednol (LOTEMAX) 0.2 % SUSP Place 1 drop into both eyes.   Yes Historical Provider,  MD  Multiple Vitamins-Minerals (ZINC PO) Take 1 tablet by mouth daily.   Yes Historical Provider, MD  pantoprazole (PROTONIX) 40 MG tablet Take 40 mg by mouth 2 (two) times daily.    Yes Historical Provider, MD  polyethylene glycol powder (MIRALAX) powder Take 17 g by mouth daily.     Yes Historical Provider, MD  potassium chloride (MICRO-K) 10 MEQ CR capsule Take 1 tablet by mouth Daily. 11/14/11  Yes Historical Provider, MD  Pyridoxine HCl (VITAMIN B-6 PO) Take 1 tablet by mouth daily.   Yes Historical Provider, MD  ranitidine (ZANTAC) 150 MG tablet Take 1 tablet by mouth Twice daily. 10/07/10  Yes Historical Provider, MD  Red Yeast Rice Extract (RED YEAST RICE PO) Take 1 tablet by mouth daily.   Yes Historical Provider, MD  Thiamine HCl (VITAMIN B-1) 250 MG tablet Take 250 mg by mouth daily.   Yes Historical Provider, MD  triamcinolone cream (KENALOG) 0.1 % Apply 1 application topically 2 (two) times daily.   Yes Historical Provider, MD  vitamin A 8000 UNIT  capsule Take 8,000 Units by mouth daily.   Yes Historical Provider, MD  vitamin B-12 (CYANOCOBALAMIN) 1000 MCG tablet Take 1,000 mcg by mouth daily.   Yes Historical Provider, MD    Scheduled Meds: .  stroke: mapping our early stages of recovery book   Does not apply Once  . aspirin  300 mg Rectal Daily   Or  . aspirin  325 mg Oral Daily  . enoxaparin (LOVENOX) injection  40 mg Subcutaneous Q24H  . famotidine  20 mg Oral BID  . folic acid  1 mg Oral Daily  . pantoprazole  40 mg Oral BID  . polyethylene glycol  17 g Oral Daily   Continuous Infusions:  PRN Meds:.acetaminophen **OR** acetaminophen, senna-docusate     Results for orders placed or performed during the hospital encounter of 10/15/15 (from the past 48 hour(s))  Ethanol     Status: None   Collection Time: 10/15/15 11:21 AM  Result Value Ref Range   Alcohol, Ethyl (B) <5 <5 mg/dL    Comment:        LOWEST DETECTABLE LIMIT FOR SERUM ALCOHOL IS 5 mg/dL FOR MEDICAL  PURPOSES ONLY   Protime-INR     Status: None   Collection Time: 10/15/15 11:22 AM  Result Value Ref Range   Prothrombin Time 14.0 11.6 - 15.2 seconds   INR 1.06 0.00 - 1.49  APTT     Status: None   Collection Time: 10/15/15 11:22 AM  Result Value Ref Range   aPTT 32 24 - 37 seconds  CBC     Status: Abnormal   Collection Time: 10/15/15 11:22 AM  Result Value Ref Range   WBC 3.8 (L) 4.0 - 10.5 K/uL   RBC 4.61 3.87 - 5.11 MIL/uL   Hemoglobin 14.1 12.0 - 15.0 g/dL   HCT 41.4 36.0 - 46.0 %   MCV 89.8 78.0 - 100.0 fL   MCH 30.6 26.0 - 34.0 pg   MCHC 34.1 30.0 - 36.0 g/dL   RDW 12.2 11.5 - 15.5 %   Platelets 223 150 - 400 K/uL  Differential     Status: None   Collection Time: 10/15/15 11:22 AM  Result Value Ref Range   Neutrophils Relative % 58 %   Neutro Abs 2.2 1.7 - 7.7 K/uL   Lymphocytes Relative 25 %   Lymphs Abs 0.9 0.7 - 4.0 K/uL   Monocytes Relative 14 %   Monocytes Absolute 0.5 0.1 - 1.0 K/uL   Eosinophils Relative 2 %   Eosinophils Absolute 0.1 0.0 - 0.7 K/uL   Basophils Relative 1 %   Basophils Absolute 0.0 0.0 - 0.1 K/uL  Comprehensive metabolic panel     Status: Abnormal   Collection Time: 10/15/15 11:22 AM  Result Value Ref Range   Sodium 131 (L) 135 - 145 mmol/L   Potassium 3.7 3.5 - 5.1 mmol/L   Chloride 99 (L) 101 - 111 mmol/L   CO2 27 22 - 32 mmol/L   Glucose, Bld 88 65 - 99 mg/dL   BUN 15 6 - 20 mg/dL   Creatinine, Ser 0.78 0.44 - 1.00 mg/dL   Calcium 10.2 8.9 - 10.3 mg/dL   Total Protein 7.8 6.5 - 8.1 g/dL   Albumin 4.4 3.5 - 5.0 g/dL   AST 26 15 - 41 U/L   ALT 16 14 - 54 U/L   Alkaline Phosphatase 69 38 - 126 U/L   Total Bilirubin 1.0 0.3 - 1.2 mg/dL   GFR calc non  Af Amer >60 >60 mL/min   GFR calc Af Amer >60 >60 mL/min    Comment: (NOTE) The eGFR has been calculated using the CKD EPI equation. This calculation has not been validated in all clinical situations. eGFR's persistently <60 mL/min signify possible Chronic Kidney Disease.    Anion  gap 5 5 - 15  Troponin I     Status: None   Collection Time: 10/15/15 11:22 AM  Result Value Ref Range   Troponin I <0.03 <0.031 ng/mL    Comment:        NO INDICATION OF MYOCARDIAL INJURY.   I-stat troponin, ED     Status: None   Collection Time: 10/15/15 11:25 AM  Result Value Ref Range   Troponin i, poc 0.02 0.00 - 0.08 ng/mL   Comment 3            Comment: Due to the release kinetics of cTnI, a negative result within the first hours of the onset of symptoms does not rule out myocardial infarction with certainty. If myocardial infarction is still suspected, repeat the test at appropriate intervals.   I-Stat Chem 8, ED  (not at [facility])     Status: Abnormal   Collection Time: 10/15/15 11:28 AM  Result Value Ref Range   Sodium 136 135 - 145 mmol/L   Potassium 3.8 3.5 - 5.1 mmol/L   Chloride 98 (L) 101 - 111 mmol/L   BUN 14 6 - 20 mg/dL   Creatinine, Ser 0.90 0.44 - 1.00 mg/dL   Glucose, Bld 85 65 - 99 mg/dL   Calcium, Ion 1.32 (H) 1.13 - 1.30 mmol/L   TCO2 26 0 - 100 mmol/L   Hemoglobin 15.3 (H) 12.0 - 15.0 g/dL   HCT 45.0 36.0 - 46.0 %  Urine rapid drug screen (hosp performed)not at [facility]     Status: None   Collection Time: 10/15/15 12:45 PM  Result Value Ref Range   Opiates NONE DETECTED NONE DETECTED   Cocaine NONE DETECTED NONE DETECTED   Benzodiazepines NONE DETECTED NONE DETECTED   Amphetamines NONE DETECTED NONE DETECTED   Tetrahydrocannabinol NONE DETECTED NONE DETECTED   Barbiturates NONE DETECTED NONE DETECTED    Comment:        DRUG SCREEN FOR MEDICAL PURPOSES ONLY.  IF CONFIRMATION IS NEEDED FOR ANY PURPOSE, NOTIFY LAB WITHIN 5 DAYS.        LOWEST DETECTABLE LIMITS FOR URINE DRUG SCREEN Drug Class       Cutoff (ng/mL) Amphetamine      1000 Barbiturate      200 Benzodiazepine   165 Tricyclics       790 Opiates          300 Cocaine          300 THC              50   Urinalysis, Routine w reflex microscopic (not at [facility])     Status: None    Collection Time: 10/15/15 12:45 PM  Result Value Ref Range   Color, Urine YELLOW YELLOW   APPearance CLEAR CLEAR   Specific Gravity, Urine 1.010 1.005 - 1.030   pH 5.5 5.0 - 8.0   Glucose, UA NEGATIVE NEGATIVE mg/dL   Hgb urine dipstick NEGATIVE NEGATIVE   Bilirubin Urine NEGATIVE NEGATIVE   Ketones, ur NEGATIVE NEGATIVE mg/dL   Protein, ur NEGATIVE NEGATIVE mg/dL   Nitrite NEGATIVE NEGATIVE   Leukocytes, UA NEGATIVE NEGATIVE    Comment: MICROSCOPIC NOT DONE ON  URINES WITH NEGATIVE PROTEIN, BLOOD, LEUKOCYTES, NITRITE, OR GLUCOSE <1000 mg/dL.  Lipid panel     Status: Abnormal   Collection Time: 10/16/15  6:12 AM  Result Value Ref Range   Cholesterol 163 0 - 200 mg/dL   Triglycerides 156 (H) <150 mg/dL   HDL 39 (L) >40 mg/dL   Total CHOL/HDL Ratio 4.2 RATIO   VLDL 31 0 - 40 mg/dL   LDL Cholesterol 93 0 - 99 mg/dL    Comment:        Total Cholesterol/HDL:CHD Risk Coronary Heart Disease Risk Table                     Men   Women  1/2 Average Risk   3.4   3.3  Average Risk       5.0   4.4  2 X Average Risk   9.6   7.1  3 X Average Risk  23.4   11.0        Use the calculated Patient Ratio above and the CHD Risk Table to determine the patient's CHD Risk.        ATP III CLASSIFICATION (LDL):  <100     mg/dL   Optimal  100-129  mg/dL   Near or Above                    Optimal  130-159  mg/dL   Borderline  160-189  mg/dL   High  >190     mg/dL   Very High     Studies/Results:   The brain MRI is reviewed in person. There are several scattered embolic appearing cortical base signal seen on diffusion imaging involving the right posterior frontal area consistent with acute infarcts. There is reduced signal seen on the ADC scan corresponding areas again consistent with acute infarcts.There are scattered non-confluent white matter signal seen on diffusion imaging consistent with chronic infarcts. No microhemorrhages are appreciated.      Aadi Bordner A. Merlene Laughter, M.D.  Diplomate,  Tax adviser of Psychiatry and Neurology ( Neurology). 10/16/2015, 7:53 AM

## 2015-10-17 ENCOUNTER — Telehealth: Payer: Self-pay | Admitting: *Deleted

## 2015-10-17 DIAGNOSIS — I639 Cerebral infarction, unspecified: Secondary | ICD-10-CM

## 2015-10-17 LAB — HEMOGLOBIN A1C
Hgb A1c MFr Bld: 5.2 % (ref 4.8–5.6)
MEAN PLASMA GLUCOSE: 103 mg/dL

## 2015-10-17 NOTE — Telephone Encounter (Signed)
-----   Message from Orinda Kenner sent at 10/16/2015  4:44 PM EDT ----- Regarding: 30 day event monitor Per Dr.Memon for stroke. Post hospital. Please send to patient's home.  Thanks, [employer]

## 2015-10-22 ENCOUNTER — Ambulatory Visit (INDEPENDENT_AMBULATORY_CARE_PROVIDER_SITE_OTHER): Payer: PPO

## 2015-10-22 DIAGNOSIS — G464 Cerebellar stroke syndrome: Secondary | ICD-10-CM | POA: Diagnosis not present

## 2015-10-22 DIAGNOSIS — I639 Cerebral infarction, unspecified: Secondary | ICD-10-CM

## 2015-10-24 DIAGNOSIS — I69354 Hemiplegia and hemiparesis following cerebral infarction affecting left non-dominant side: Secondary | ICD-10-CM | POA: Diagnosis not present

## 2015-10-31 DIAGNOSIS — I639 Cerebral infarction, unspecified: Secondary | ICD-10-CM | POA: Diagnosis not present

## 2015-10-31 DIAGNOSIS — M6281 Muscle weakness (generalized): Secondary | ICD-10-CM | POA: Diagnosis not present

## 2015-11-15 DIAGNOSIS — E213 Hyperparathyroidism, unspecified: Secondary | ICD-10-CM | POA: Diagnosis not present

## 2015-11-15 DIAGNOSIS — Z6824 Body mass index (BMI) 24.0-24.9, adult: Secondary | ICD-10-CM | POA: Diagnosis not present

## 2015-11-15 DIAGNOSIS — I10 Essential (primary) hypertension: Secondary | ICD-10-CM | POA: Diagnosis not present

## 2015-11-15 DIAGNOSIS — L03317 Cellulitis of buttock: Secondary | ICD-10-CM | POA: Diagnosis not present

## 2015-11-15 DIAGNOSIS — I63521 Cerebral infarction due to unspecified occlusion or stenosis of right anterior cerebral artery: Secondary | ICD-10-CM | POA: Diagnosis not present

## 2015-11-15 DIAGNOSIS — E782 Mixed hyperlipidemia: Secondary | ICD-10-CM | POA: Diagnosis not present

## 2015-11-28 DIAGNOSIS — Z8673 Personal history of transient ischemic attack (TIA), and cerebral infarction without residual deficits: Secondary | ICD-10-CM | POA: Diagnosis not present

## 2015-11-28 DIAGNOSIS — M6281 Muscle weakness (generalized): Secondary | ICD-10-CM | POA: Diagnosis not present

## 2015-11-29 ENCOUNTER — Ambulatory Visit (INDEPENDENT_AMBULATORY_CARE_PROVIDER_SITE_OTHER): Payer: PPO | Admitting: Cardiology

## 2015-11-29 ENCOUNTER — Encounter: Payer: Self-pay | Admitting: Cardiology

## 2015-11-29 VITALS — BP 108/66 | HR 70 | Ht 62.0 in | Wt 131.6 lb

## 2015-11-29 DIAGNOSIS — I10 Essential (primary) hypertension: Secondary | ICD-10-CM | POA: Diagnosis not present

## 2015-11-29 DIAGNOSIS — E785 Hyperlipidemia, unspecified: Secondary | ICD-10-CM

## 2015-11-29 DIAGNOSIS — R079 Chest pain, unspecified: Secondary | ICD-10-CM

## 2015-11-29 DIAGNOSIS — I639 Cerebral infarction, unspecified: Secondary | ICD-10-CM

## 2015-11-29 MED ORDER — HYDROCHLOROTHIAZIDE 12.5 MG PO CAPS: 12.5000 mg | ORAL_CAPSULE | Freq: Every day | ORAL | 3 refills | Status: DC

## 2015-11-29 MED ORDER — ATORVASTATIN CALCIUM 10 MG PO TABS: 10.0000 mg | ORAL_TABLET | Freq: Every day | ORAL | 3 refills | Status: DC

## 2015-11-29 NOTE — Patient Instructions (Signed)
Your physician wants you to follow-up in: [place] DR. BRANCH You will receive a reminder letter in the mail two months in advance. If you don't receive a letter, please call our office to schedule the follow-up appointment.  Your physician has recommended you make the following change in your medication:   DECREASE HYDROCHLOROTHIAZIDE 12.5 MG DAILY  Thank you for choosing [facility]!!

## 2015-11-29 NOTE — Progress Notes (Signed)
Clinical Summary Peggy Ramirez is a 82 y.o.female seen today for follow up of the following medical problems.   1. Chest pain - from previous clinic notes has history of chest pain and SOB, normal DSE in 11/2011 - 10/2014 Lexiscan showed no ischemia - mild nonspecific symptoms since last visit  2. HTN - compliant with meds  3. CVA - recent admit 09/2015 with left hand weakness. MRI showed small cortical infarct in the posterior right MCA territory.  - 30 day event monitor without significant arrhythmias  Past Medical History:  Diagnosis Date  . Chronic constipation   . Chronic left lower quadrant pain   . Headache(784.0)   . High cholesterol   . Hydrosalpinx    LEFT  . Hypertension   . Lupus ([place])   . 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 Ovarian fibroma    RIGHT  . Pelvic adhesions   . Sjogren's syndrome ([place])      Allergies  Allergen Reactions  . Cellcept [Mycophenolate Mofetil]   . Levofloxacin     REACTION: rash  . Medrol [Methylprednisolone]   . Mycophenolate Mofetil   . Penicillins     REACTION: itching  . Sulfonamide Derivatives     REACTION: nausea  . Topiramate   . Verapamil     Felt shakey     Current Outpatient Prescriptions  Medication Sig Dispense Refill  . aspirin EC 325 MG tablet Take 1 tablet (325 mg total) by mouth daily. 30 tablet 1  . atorvastatin (LIPITOR) 10 MG tablet Take 1 tablet (10 mg total) by mouth daily. 30 tablet 1  . Biotin 10 MG CAPS Take 10 mg by mouth daily.    . Cholecalciferol (VITAMIN D-3 PO) Take 1 tablet by mouth daily.    . folic acid (FOLVITE) A999333 MCG tablet Take 400 mcg by mouth daily.    . hydrochlorothiazide 25 MG tablet Take 25 mg by mouth daily.      [name] loratadine (CLARITIN) 10 MG tablet Take 10 mg by mouth daily.    [name] LOTEMAX 0.5 % ophthalmic suspension Place 1 drop into both eyes daily.    [name] loteprednol (LOTEMAX) 0.2 % SUSP Place 1 drop into both eyes.    . Multiple  Vitamins-Minerals (ZINC PO) Take 1 tablet by mouth daily.    . pantoprazole (PROTONIX) 40 MG tablet Take 40 mg by mouth 2 (two) times daily.     . polyethylene glycol powder (MIRALAX) powder Take 17 g by mouth daily.      . potassium chloride (MICRO-K) 10 MEQ CR capsule Take 1 tablet by mouth Daily.    . Pyridoxine HCl (VITAMIN B-6 PO) Take 1 tablet by mouth daily.    . ranitidine (ZANTAC) 150 MG tablet Take 1 tablet by mouth Twice daily.    . Red Yeast Rice Extract (RED YEAST RICE PO) Take 1 tablet by mouth daily.    . Thiamine HCl (VITAMIN B-1) 250 MG tablet Take 250 mg by mouth daily.    [name] triamcinolone cream (KENALOG) 0.1 % Apply 1 application topically 2 (two) times daily.    . vitamin A 8000 UNIT capsule Take 8,000 Units by mouth daily.    . vitamin B-12 (CYANOCOBALAMIN) 1000 MCG tablet Take 1,000 mcg by mouth daily.     No current facility-administered medications for this visit.      Past Surgical History:  Procedure Laterality Date  . APPENDECTOMY  2002  .  LAPAROSCOPIC CHOLECYSTECTOMY  2002  . TONSILLECTOMY  1941  . TOTAL ABDOMINAL HYSTERECTOMY W/ BILATERAL SALPINGOOPHORECTOMY  2005  . VAGINAL HYSTERECTOMY  1981     Allergies  Allergen Reactions  . Cellcept [Mycophenolate Mofetil]   . Levofloxacin     REACTION: rash  . Medrol [Methylprednisolone]   . Mycophenolate Mofetil   . Penicillins     REACTION: itching  . Sulfonamide Derivatives     REACTION: nausea  . Topiramate   . Verapamil     Felt shakey      Family History  Problem Relation Age of Onset  . Heart disease    . Anxiety disorder    . Depression       Social History Ms. Sprankle reports that she has never smoked. She has never used smokeless tobacco. Ms. Muraoka reports that she does not drink alcohol.   Review of Systems CONSTITUTIONAL: No weight loss, fever, chills, weakness or fatigue.  HEENT: Eyes: No visual loss, blurred vision, double vision or yellow sclerae.No hearing loss, sneezing,  congestion, runny nose or sore throat.  SKIN: No rash or itching.  CARDIOVASCULAR: per HPI RESPIRATORY: No shortness of breath, cough or sputum.  GASTROINTESTINAL: No anorexia, nausea, vomiting or diarrhea. No abdominal pain or blood.  GENITOURINARY: No burning on urination, no polyuria NEUROLOGICAL: No headache, dizziness, syncope, paralysis, ataxia, numbness or tingling in the extremities. No change in bowel or bladder control.  MUSCULOSKELETAL: No muscle, back pain, joint pain or stiffness.  LYMPHATICS: No enlarged nodes. No history of splenectomy.  PSYCHIATRIC: No history of depression or anxiety.  ENDOCRINOLOGIC: No reports of sweating, cold or heat intolerance. No polyuria or polydipsia.  [name]   Physical Examination Vitals:   11/29/15 1508  BP: 108/66  Pulse: 70   Vitals:   11/29/15 1508  Weight: 131 lb 9.6 oz (59.7 kg)  Height: 5\' 2"  (1.575 m)    Gen: resting comfortably, no acute distress HEENT: no scleral icterus, pupils equal round and reactive, no palptable cervical adenopathy,  CV: RRR, no m/r/g, no jvd Resp: Clear to auscultation bilaterally GI: abdomen is soft, non-tender, non-distended, normal bowel sounds, no hepatosplenomegaly MSK: extremities are warm, no edema.  Skin: warm, no rash Neuro:  no focal deficits Psych: appropriate affect   Diagnostic Studies 11/2011 DSE Baseline LVEF 60-65%, negative for ischemia  10/2014 Lexiscan MPI  The study is normal.  This is a low risk study.  Nuclear stress EF: 60%.  There was no ST segment deviation noted during stress.  09/2015 echo Study Conclusions  - Left ventricle: The cavity size was normal. There was focal basal   hypertrophy. Systolic function was normal. The estimated ejection   fraction was in the range of 55% to 60%. Wall motion was normal;   there were no regional wall motion abnormalities. Doppler   parameters are consistent with abnormal left ventricular   relaxation (grade 1 diastolic  dysfunction). - Aortic valve: Mildly calcified annulus. Trileaflet; normal   thickness leaflets. Valve area (VTI): 2.18 cm^2. Valve area   (Vmax): 2.24 cm^2. - Mitral valve: There was mild regurgitation. - Atrial septum: No defect or patent foramen ovale was identified. - Technically adequate study.  09/2015 Carotid US IMPRESSION: Mild amount of plaque at both carotid bifurcations, as above. Estimated bilateral ICA stenoses are less than 50%.    Assessment and Plan  1. Chest pain - long history of symptoms. Negative DSE in 2013, recent lexiscan negative for ischemia - continue to monitor at this  time.   2. HTN - at goal, she will continue current meds  3. Hyperlipidemia -at goal, continue staitn  4. CVA - no evidence of cardiogenic source - continue secondary prevention  F/u 6 months       Arnoldo Lenis, M.D.

## 2015-12-13 DIAGNOSIS — E213 Hyperparathyroidism, unspecified: Secondary | ICD-10-CM | POA: Diagnosis not present

## 2015-12-13 DIAGNOSIS — E7800 Pure hypercholesterolemia, unspecified: Secondary | ICD-10-CM | POA: Diagnosis not present

## 2015-12-13 DIAGNOSIS — E782 Mixed hyperlipidemia: Secondary | ICD-10-CM | POA: Diagnosis not present

## 2015-12-13 DIAGNOSIS — I10 Essential (primary) hypertension: Secondary | ICD-10-CM | POA: Diagnosis not present

## 2015-12-17 DIAGNOSIS — Z1231 Encounter for screening mammogram for malignant neoplasm of breast: Secondary | ICD-10-CM | POA: Diagnosis not present

## 2015-12-19 DIAGNOSIS — K210 Gastro-esophageal reflux disease with esophagitis: Secondary | ICD-10-CM | POA: Diagnosis not present

## 2015-12-19 DIAGNOSIS — E213 Hyperparathyroidism, unspecified: Secondary | ICD-10-CM | POA: Diagnosis not present

## 2015-12-19 DIAGNOSIS — Z6824 Body mass index (BMI) 24.0-24.9, adult: Secondary | ICD-10-CM | POA: Diagnosis not present

## 2015-12-19 DIAGNOSIS — E782 Mixed hyperlipidemia: Secondary | ICD-10-CM | POA: Diagnosis not present

## 2015-12-19 DIAGNOSIS — M545 Low back pain: Secondary | ICD-10-CM | POA: Diagnosis not present

## 2015-12-19 DIAGNOSIS — N183 Chronic kidney disease, stage 3 (moderate): Secondary | ICD-10-CM | POA: Diagnosis not present

## 2015-12-19 DIAGNOSIS — M3509 Sicca syndrome with other organ involvement: Secondary | ICD-10-CM | POA: Diagnosis not present

## 2016-01-09 DIAGNOSIS — L570 Actinic keratosis: Secondary | ICD-10-CM | POA: Diagnosis not present

## 2016-01-09 DIAGNOSIS — L309 Dermatitis, unspecified: Secondary | ICD-10-CM | POA: Diagnosis not present

## 2016-02-20 DIAGNOSIS — Z23 Encounter for immunization: Secondary | ICD-10-CM | POA: Diagnosis not present

## 2016-03-19 DIAGNOSIS — M19042 Primary osteoarthritis, left hand: Secondary | ICD-10-CM | POA: Diagnosis not present

## 2016-03-19 DIAGNOSIS — M3500 Sicca syndrome, unspecified: Secondary | ICD-10-CM | POA: Diagnosis not present

## 2016-03-19 DIAGNOSIS — M25561 Pain in right knee: Secondary | ICD-10-CM | POA: Diagnosis not present

## 2016-03-19 DIAGNOSIS — M19041 Primary osteoarthritis, right hand: Secondary | ICD-10-CM | POA: Diagnosis not present

## 2016-04-09 DIAGNOSIS — I10 Essential (primary) hypertension: Secondary | ICD-10-CM | POA: Diagnosis not present

## 2016-04-09 DIAGNOSIS — E7800 Pure hypercholesterolemia, unspecified: Secondary | ICD-10-CM | POA: Diagnosis not present

## 2016-04-09 DIAGNOSIS — E782 Mixed hyperlipidemia: Secondary | ICD-10-CM | POA: Diagnosis not present

## 2016-04-09 DIAGNOSIS — E213 Hyperparathyroidism, unspecified: Secondary | ICD-10-CM | POA: Diagnosis not present

## 2016-04-09 DIAGNOSIS — N183 Chronic kidney disease, stage 3 (moderate): Secondary | ICD-10-CM | POA: Diagnosis not present

## 2016-04-16 DIAGNOSIS — E782 Mixed hyperlipidemia: Secondary | ICD-10-CM | POA: Diagnosis not present

## 2016-04-16 DIAGNOSIS — M3509 Sicca syndrome with other organ involvement: Secondary | ICD-10-CM | POA: Diagnosis not present

## 2016-04-16 DIAGNOSIS — E213 Hyperparathyroidism, unspecified: Secondary | ICD-10-CM | POA: Diagnosis not present

## 2016-04-16 DIAGNOSIS — M545 Low back pain: Secondary | ICD-10-CM | POA: Diagnosis not present

## 2016-04-16 DIAGNOSIS — Z1389 Encounter for screening for other disorder: Secondary | ICD-10-CM | POA: Diagnosis not present

## 2016-04-16 DIAGNOSIS — N183 Chronic kidney disease, stage 3 (moderate): Secondary | ICD-10-CM | POA: Diagnosis not present

## 2016-04-16 DIAGNOSIS — I63511 Cerebral infarction due to unspecified occlusion or stenosis of right middle cerebral artery: Secondary | ICD-10-CM | POA: Diagnosis not present

## 2016-05-07 DIAGNOSIS — R05 Cough: Secondary | ICD-10-CM | POA: Diagnosis not present

## 2016-05-07 DIAGNOSIS — J069 Acute upper respiratory infection, unspecified: Secondary | ICD-10-CM | POA: Diagnosis not present

## 2016-05-07 DIAGNOSIS — Z6823 Body mass index (BMI) 23.0-23.9, adult: Secondary | ICD-10-CM | POA: Diagnosis not present

## 2016-05-26 ENCOUNTER — Encounter: Payer: Self-pay | Admitting: *Deleted

## 2016-05-26 ENCOUNTER — Ambulatory Visit (INDEPENDENT_AMBULATORY_CARE_PROVIDER_SITE_OTHER): Payer: PPO | Admitting: Cardiology

## 2016-05-26 VITALS — BP 136/75 | HR 62 | Ht 62.0 in | Wt 128.0 lb

## 2016-05-26 DIAGNOSIS — R0789 Other chest pain: Secondary | ICD-10-CM | POA: Diagnosis not present

## 2016-05-26 DIAGNOSIS — I10 Essential (primary) hypertension: Secondary | ICD-10-CM | POA: Diagnosis not present

## 2016-05-26 DIAGNOSIS — E782 Mixed hyperlipidemia: Secondary | ICD-10-CM

## 2016-05-26 NOTE — Progress Notes (Signed)
Clinical Summary Peggy Ramirez is a 83 y.o.female seen today for follow up of the following medical problems.   1. Chest pain - from previous clinic notes has history of chest pain and SOB, normal DSE in 11/2011 - 10/2014 Lexiscan showed no ischemia  - occasional chest pain at times. Worst with laying down. Not specifically exertional. Overall unchanged since last visit.  - no relation to food. Can have occasional dysphagia. Has been seen by GI in the past, prior esophageal dilatation.   2. HTN -she is  compliant with meds  3. CVA - admit 09/2015 with left hand weakness. MRI showed small cortical infarct in the posterior right MCA territory.  - 30 day event monitor without significant arrhythmias - no recent symptoms   SH: recent 83[birth year] birth[birth year]day, she had several family members over to her house.  Past Medical History:  Diagnosis Date  . Chronic constipation   . Chronic left lower quadrant pain   . Headache(784.0)   . High cholesterol   . Hydrosalpinx    LEFT  . Hypertension   . Lupus   . 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 Ovarian fibroma    RIGHT  . Pelvic adhesions   . Sjogren's syndrome ([place])      Allergies  Allergen Reactions  . Cellcept [Mycophenolate Mofetil]   . Levofloxacin     REACTION: rash  . Medrol [Methylprednisolone]   . Mycophenolate Mofetil   . Penicillins     REACTION: itching  . Sulfonamide Derivatives     REACTION: nausea  . Topiramate   . Verapamil     Felt shakey     Current Outpatient Prescriptions  Medication Sig Dispense Refill  . Ascorbic Acid (VITAMIN C) 1000 MG tablet Take 1,000 mg by mouth daily.    [name] aspirin EC 325 MG tablet Take 1 tablet (325 mg total) by mouth daily. 30 tablet 1  . atorvastatin (LIPITOR) 10 MG tablet Take 1 tablet (10 mg total) by mouth daily. 90 tablet 3  . Biotin 10 MG CAPS Take 10 mg by mouth daily.    . Cholecalciferol (VITAMIN D-3 PO) Take 1 tablet by mouth daily.    .  Evening Primrose Oil 1000 MG CAPS Take 1 capsule by mouth daily.    . folic acid (FOLVITE) [id number] MCG tablet Take 400 mcg by mouth daily.    . hydrochlorothiazide (MICROZIDE) 12.5 MG capsule Take 1 capsule (12.5 mg total) by mouth daily. 90 capsule 3  . loratadine (CLARITIN) 10 MG tablet Take 10 mg by mouth daily.    [name] loteprednol (LOTEMAX) 0.2 % SUSP Place 1 drop into both eyes.    . Multiple Vitamins-Minerals (ZINC PO) Take 1 tablet by mouth daily.    . pantoprazole (PROTONIX) 40 MG tablet Take 40 mg by mouth 2 (two) times daily.     . polyethylene glycol powder (MIRALAX) powder Take 17 g by mouth daily.      . potassium chloride (MICRO-K) 10 MEQ CR capsule Take 1 tablet by mouth Daily.    . Pyridoxine HCl (VITAMIN B-6 PO) Take 1 tablet by mouth daily.    . ranitidine (ZANTAC) 150 MG tablet Take 1 tablet by mouth Twice daily.    . Red Yeast Rice Extract (RED YEAST RICE PO) Take 1 tablet by mouth daily.    [name] triamcinolone cream (KENALOG) 0.1 % Apply 1 application topically 2 (two) times daily.    . vitamin  A 8000 UNIT capsule Take 8,000 Units by mouth daily.    . vitamin B-12 (CYANOCOBALAMIN) 1000 MCG tablet Take 1,000 mcg by mouth daily.    . vitamin E 1000 UNIT capsule Take 1,000 Units by mouth daily.     No current facility-administered medications for this visit.      Past Surgical History:  Procedure Laterality Date  . APPENDECTOMY  2002  . LAPAROSCOPIC CHOLECYSTECTOMY  2002  . TONSILLECTOMY  1941  . TOTAL ABDOMINAL HYSTERECTOMY W/ BILATERAL SALPINGOOPHORECTOMY  2005  . VAGINAL HYSTERECTOMY  1981     Allergies  Allergen Reactions  . Cellcept [Mycophenolate Mofetil]   . Levofloxacin     REACTION: rash  . Medrol [Methylprednisolone]   . Mycophenolate Mofetil   . Penicillins     REACTION: itching  . Sulfonamide Derivatives     REACTION: nausea  . Topiramate   . Verapamil     Felt shakey      Family History  Problem Relation Age of Onset  . Heart disease    .  Anxiety disorder    . Depression       Social History Peggy Ramirez reports that she has never smoked. She has never used smokeless tobacco. Peggy Ramirez reports that she does not drink alcohol.   Review of Systems CONSTITUTIONAL: No weight loss, fever, chills, weakness or fatigue.  HEENT: Eyes: No visual loss, blurred vision, double vision or yellow sclerae.No hearing loss, sneezing, congestion, runny nose or sore throat.  SKIN: No rash or itching.  CARDIOVASCULAR: per HPI RESPIRATORY: No shortness of breath, cough or sputum.  GASTROINTESTINAL: occasional dysphagia GENITOURINARY: No burning on urination, no polyuria NEUROLOGICAL: No headache, dizziness, syncope, paralysis, ataxia, numbness or tingling in the extremities. No change in bowel or bladder control.  MUSCULOSKELETAL: No muscle, back pain, joint pain or stiffness.  LYMPHATICS: No enlarged nodes. No history of splenectomy.  PSYCHIATRIC: No history of depression or anxiety.  ENDOCRINOLOGIC: No reports of sweating, cold or heat intolerance. No polyuria or polydipsia.  [name]   Physical Examination Vitals:   05/26/16 1557  BP: 136/75  Pulse: 62   Vitals:   05/26/16 1557  Weight: 128 lb (58.1 kg)  Height: 5\' 2"  (1.575 m)    Gen: resting comfortably, no acute distress HEENT: no scleral icterus, pupils equal round and reactive, no palptable cervical adenopathy,  CV Resp: Clear to auscultation bilaterally GI: abdomen is soft, non-tender, non-distended, normal bowel sounds, no hepatosplenomegaly MSK: extremities are warm, no edema.  Skin: warm, no rash Neuro:  no focal deficits Psych: appropriate affect   Diagnostic Studies 11/2011 DSE Baseline LVEF 60-65%, negative for ischemia  10/2014 Lexiscan MPI  The study is normal.  This is a low risk study.  Nuclear stress EF: 60%.  There was no ST segment deviation noted during stress.  09/2015 echo Study Conclusions  - Left ventricle: The cavity size was normal.  There was focal basal hypertrophy. Systolic function was normal. The estimated ejection fraction was in the range of 55% to 60%. Wall motion was normal; there were no regional wall motion abnormalities. Doppler parameters are consistent with abnormal left ventricular relaxation (grade 1 diastolic dysfunction). - Aortic valve: Mildly calcified annulus. Trileaflet; normal thickness leaflets. Valve area (VTI): 2.18 cm^2. Valve area (Vmax): 2.24 cm^2. - Mitral valve: There was mild regurgitation. - Atrial septum: No defect or patent foramen ovale was identified. - Technically adequate study.  09/2015 Carotid US IMPRESSION: Mild amount of plaque at both carotid bifurcations,  as above. Estimated bilateral ICA stenoses are less than 50%.      Assessment and Plan   1. Chest pain - long history of symptoms. Negative DSE in 2013, more recent lexiscan negative for ischemia - continue to monitor at this time. Advised to f/u with her GI physician who she has not seen in some time.   2. HTN - she is at goal, continue current meds  3. Hyperlipidemia -she is at goal, she will continue staitn  4. CVA - no evidence of cardiogenic source - she will continue secondary prevention. Has been on full dose ASA  F/u 6 months      Arnoldo Lenis, M.D.

## 2016-05-26 NOTE — Patient Instructions (Signed)

## 2016-05-28 ENCOUNTER — Ambulatory Visit: Payer: PPO | Admitting: Cardiology

## 2016-07-23 DIAGNOSIS — M19041 Primary osteoarthritis, right hand: Secondary | ICD-10-CM | POA: Diagnosis not present

## 2016-07-23 DIAGNOSIS — M19042 Primary osteoarthritis, left hand: Secondary | ICD-10-CM | POA: Diagnosis not present

## 2016-07-23 DIAGNOSIS — M25561 Pain in right knee: Secondary | ICD-10-CM | POA: Diagnosis not present

## 2016-07-23 DIAGNOSIS — M3500 Sicca syndrome, unspecified: Secondary | ICD-10-CM | POA: Diagnosis not present

## 2016-07-23 DIAGNOSIS — M25562 Pain in left knee: Secondary | ICD-10-CM | POA: Diagnosis not present

## 2016-07-30 DIAGNOSIS — M25561 Pain in right knee: Secondary | ICD-10-CM | POA: Diagnosis not present

## 2016-07-30 DIAGNOSIS — M19042 Primary osteoarthritis, left hand: Secondary | ICD-10-CM | POA: Diagnosis not present

## 2016-07-30 DIAGNOSIS — M65342 Trigger finger, left ring finger: Secondary | ICD-10-CM | POA: Diagnosis not present

## 2016-07-30 DIAGNOSIS — M19041 Primary osteoarthritis, right hand: Secondary | ICD-10-CM | POA: Diagnosis not present

## 2016-07-30 DIAGNOSIS — M25562 Pain in left knee: Secondary | ICD-10-CM | POA: Diagnosis not present

## 2016-08-08 DIAGNOSIS — E213 Hyperparathyroidism, unspecified: Secondary | ICD-10-CM | POA: Diagnosis not present

## 2016-08-08 DIAGNOSIS — I10 Essential (primary) hypertension: Secondary | ICD-10-CM | POA: Diagnosis not present

## 2016-08-08 DIAGNOSIS — K210 Gastro-esophageal reflux disease with esophagitis: Secondary | ICD-10-CM | POA: Diagnosis not present

## 2016-08-08 DIAGNOSIS — E782 Mixed hyperlipidemia: Secondary | ICD-10-CM | POA: Diagnosis not present

## 2016-08-13 DIAGNOSIS — M25561 Pain in right knee: Secondary | ICD-10-CM | POA: Diagnosis not present

## 2016-08-13 DIAGNOSIS — M3500 Sicca syndrome, unspecified: Secondary | ICD-10-CM | POA: Diagnosis not present

## 2016-08-13 DIAGNOSIS — M25562 Pain in left knee: Secondary | ICD-10-CM | POA: Diagnosis not present

## 2016-08-13 DIAGNOSIS — M65332 Trigger finger, left middle finger: Secondary | ICD-10-CM | POA: Diagnosis not present

## 2016-08-20 DIAGNOSIS — E039 Hypothyroidism, unspecified: Secondary | ICD-10-CM | POA: Diagnosis not present

## 2016-08-20 DIAGNOSIS — M3509 Sicca syndrome with other organ involvement: Secondary | ICD-10-CM | POA: Diagnosis not present

## 2016-08-20 DIAGNOSIS — N189 Chronic kidney disease, unspecified: Secondary | ICD-10-CM | POA: Diagnosis not present

## 2016-08-20 DIAGNOSIS — R079 Chest pain, unspecified: Secondary | ICD-10-CM | POA: Diagnosis not present

## 2016-08-20 DIAGNOSIS — K210 Gastro-esophageal reflux disease with esophagitis: Secondary | ICD-10-CM | POA: Diagnosis not present

## 2016-11-01 DIAGNOSIS — Z6823 Body mass index (BMI) 23.0-23.9, adult: Secondary | ICD-10-CM | POA: Diagnosis not present

## 2016-11-01 DIAGNOSIS — M461 Sacroiliitis, not elsewhere classified: Secondary | ICD-10-CM | POA: Diagnosis not present

## 2016-11-01 DIAGNOSIS — M545 Low back pain: Secondary | ICD-10-CM | POA: Diagnosis not present

## 2016-11-04 ENCOUNTER — Other Ambulatory Visit: Payer: Self-pay | Admitting: Cardiology

## 2016-11-23 DIAGNOSIS — Z79899 Other long term (current) drug therapy: Secondary | ICD-10-CM | POA: Diagnosis not present

## 2016-11-23 DIAGNOSIS — M7752 Other enthesopathy of left foot: Secondary | ICD-10-CM | POA: Diagnosis not present

## 2016-11-23 DIAGNOSIS — M779 Enthesopathy, unspecified: Secondary | ICD-10-CM | POA: Diagnosis not present

## 2016-11-23 DIAGNOSIS — Z7982 Long term (current) use of aspirin: Secondary | ICD-10-CM | POA: Diagnosis not present

## 2016-11-23 DIAGNOSIS — I10 Essential (primary) hypertension: Secondary | ICD-10-CM | POA: Diagnosis not present

## 2016-11-23 DIAGNOSIS — M25572 Pain in left ankle and joints of left foot: Secondary | ICD-10-CM | POA: Diagnosis not present

## 2016-11-23 DIAGNOSIS — M79672 Pain in left foot: Secondary | ICD-10-CM | POA: Diagnosis not present

## 2016-12-01 DIAGNOSIS — K210 Gastro-esophageal reflux disease with esophagitis: Secondary | ICD-10-CM | POA: Diagnosis not present

## 2016-12-01 DIAGNOSIS — E7800 Pure hypercholesterolemia, unspecified: Secondary | ICD-10-CM | POA: Diagnosis not present

## 2016-12-01 DIAGNOSIS — E782 Mixed hyperlipidemia: Secondary | ICD-10-CM | POA: Diagnosis not present

## 2016-12-01 DIAGNOSIS — N183 Chronic kidney disease, stage 3 (moderate): Secondary | ICD-10-CM | POA: Diagnosis not present

## 2016-12-01 DIAGNOSIS — I10 Essential (primary) hypertension: Secondary | ICD-10-CM | POA: Diagnosis not present

## 2016-12-01 DIAGNOSIS — E213 Hyperparathyroidism, unspecified: Secondary | ICD-10-CM | POA: Diagnosis not present

## 2016-12-03 DIAGNOSIS — E782 Mixed hyperlipidemia: Secondary | ICD-10-CM | POA: Diagnosis not present

## 2016-12-03 DIAGNOSIS — N183 Chronic kidney disease, stage 3 (moderate): Secondary | ICD-10-CM | POA: Diagnosis not present

## 2016-12-03 DIAGNOSIS — M545 Low back pain: Secondary | ICD-10-CM | POA: Diagnosis not present

## 2016-12-03 DIAGNOSIS — E213 Hyperparathyroidism, unspecified: Secondary | ICD-10-CM | POA: Diagnosis not present

## 2016-12-03 DIAGNOSIS — M3509 Sicca syndrome with other organ involvement: Secondary | ICD-10-CM | POA: Diagnosis not present

## 2016-12-03 DIAGNOSIS — K210 Gastro-esophageal reflux disease with esophagitis: Secondary | ICD-10-CM | POA: Diagnosis not present

## 2016-12-03 DIAGNOSIS — Z6824 Body mass index (BMI) 24.0-24.9, adult: Secondary | ICD-10-CM | POA: Diagnosis not present

## 2016-12-11 DIAGNOSIS — S76311A Strain of muscle, fascia and tendon of the posterior muscle group at thigh level, right thigh, initial encounter: Secondary | ICD-10-CM | POA: Diagnosis not present

## 2016-12-11 DIAGNOSIS — S76312A Strain of muscle, fascia and tendon of the posterior muscle group at thigh level, left thigh, initial encounter: Secondary | ICD-10-CM | POA: Diagnosis not present

## 2016-12-11 DIAGNOSIS — Z6824 Body mass index (BMI) 24.0-24.9, adult: Secondary | ICD-10-CM | POA: Diagnosis not present

## 2016-12-16 DIAGNOSIS — M545 Low back pain: Secondary | ICD-10-CM | POA: Diagnosis not present

## 2016-12-16 DIAGNOSIS — M791 Myalgia: Secondary | ICD-10-CM | POA: Diagnosis not present

## 2016-12-16 DIAGNOSIS — Z6824 Body mass index (BMI) 24.0-24.9, adult: Secondary | ICD-10-CM | POA: Diagnosis not present

## 2016-12-19 DIAGNOSIS — Z1231 Encounter for screening mammogram for malignant neoplasm of breast: Secondary | ICD-10-CM | POA: Diagnosis not present

## 2017-01-06 ENCOUNTER — Other Ambulatory Visit: Payer: Self-pay | Admitting: Cardiology

## 2017-01-07 ENCOUNTER — Encounter: Payer: Self-pay | Admitting: Cardiology

## 2017-01-07 ENCOUNTER — Ambulatory Visit (INDEPENDENT_AMBULATORY_CARE_PROVIDER_SITE_OTHER): Payer: PPO | Admitting: Cardiology

## 2017-01-07 VITALS — BP 130/82 | HR 64 | Ht 62.0 in | Wt 132.0 lb

## 2017-01-07 DIAGNOSIS — I10 Essential (primary) hypertension: Secondary | ICD-10-CM | POA: Diagnosis not present

## 2017-01-07 DIAGNOSIS — R0789 Other chest pain: Secondary | ICD-10-CM

## 2017-01-07 DIAGNOSIS — E782 Mixed hyperlipidemia: Secondary | ICD-10-CM

## 2017-01-07 NOTE — Progress Notes (Signed)
Clinical Summary Peggy Ramirez is a 83 y.o.female seen today for follow up of the following medical problems.   1. Chest pain - from previous clinic notes has history of chest pain and SOB, normal DSE in 11/2011 - 10/2014 Lexiscan showed no ischemia   - mild SOB at times with high levels of activity. Denies any recent chest pain.   2. HTN - she takes her home bp meds daily.   3. CVA - recent admit 09/2015 with left hand weakness. MRI showed small cortical infarct in the posterior right MCA territory.  - 30 day event monitor without significant arrhythmias  4. HL - atorva caused muscle aches, symptoms improved after stopping about 1 month ago.  - has not been on statin    Past Medical History:  Diagnosis Date  . Chronic constipation   . Chronic left lower quadrant pain   . Headache(784.0)   . High cholesterol   . Hydrosalpinx    LEFT  . Hypertension   . Lupus   . 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 Ovarian fibroma    RIGHT  . Pelvic adhesions   . Sjogren's syndrome ([place])      Allergies  Allergen Reactions  . Cellcept [Mycophenolate Mofetil]   . Levofloxacin     REACTION: rash  . Medrol [Methylprednisolone]   . Mycophenolate Mofetil   . Penicillins     REACTION: itching  . Sulfonamide Derivatives     REACTION: nausea  . Topiramate   . Verapamil     Felt shakey     Current Outpatient Prescriptions  Medication Sig Dispense Refill  . Ascorbic Acid (VITAMIN C) 1000 MG tablet Take 1,000 mg by mouth daily.    [name] aspirin EC 325 MG tablet Take 1 tablet (325 mg total) by mouth daily. 30 tablet 1  . atorvastatin (LIPITOR) 10 MG tablet TAKE ONE TABLET BY MOUTH ONCE DAILY 90 tablet 3  . Biotin 10 MG CAPS Take 10 mg by mouth daily.    . Cholecalciferol (VITAMIN D-3 PO) Take 1 tablet by mouth daily.    . folic acid (FOLVITE) 774 MCG tablet Take 400 mcg by mouth daily.    . hydrochlorothiazide (MICROZIDE) 12.5 MG capsule Take 1 capsule  by mouth daily.    . hydrochlorothiazide (MICROZIDE) 12.5 MG capsule TAKE ONE CAPSULE BY MOUTH ONCE DAILY 90 capsule 3  . loratadine (CLARITIN) 10 MG tablet Take 10 mg by mouth daily.    [name] loteprednol (LOTEMAX) 0.2 % SUSP Place 1 drop into both eyes.    . pantoprazole (PROTONIX) 40 MG tablet Take 40 mg by mouth 2 (two) times daily.     . polyethylene glycol powder (MIRALAX) powder Take 17 g by mouth daily.      . potassium chloride (MICRO-K) 10 MEQ CR capsule Take 1 tablet by mouth Daily.    . ranitidine (ZANTAC) 150 MG tablet Take 1 tablet by mouth Twice daily.    . vitamin A 8000 UNIT capsule Take 8,000 Units by mouth daily.    . vitamin B-12 (CYANOCOBALAMIN) 1000 MCG tablet Take 1,000 mcg by mouth daily.    . vitamin E 1000 UNIT capsule Take 1,000 Units by mouth daily.     No current facility-administered medications for this visit.      Past Surgical History:  Procedure Laterality Date  . APPENDECTOMY  2002  . LAPAROSCOPIC CHOLECYSTECTOMY  2002  . TONSILLECTOMY  1941  .  TOTAL ABDOMINAL HYSTERECTOMY W/ BILATERAL SALPINGOOPHORECTOMY  2005  . VAGINAL HYSTERECTOMY  1981     Allergies  Allergen Reactions  . Cellcept [Mycophenolate Mofetil]   . Levofloxacin     REACTION: rash  . Medrol [Methylprednisolone]   . Mycophenolate Mofetil   . Penicillins     REACTION: itching  . Sulfonamide Derivatives     REACTION: nausea  . Topiramate   . Verapamil     Felt shakey      Family History  Problem Relation Age of Onset  . Heart disease Unknown   . Anxiety disorder Unknown   . Depression Unknown      Social History Ms. Kettlewell reports that she has never smoked. She has never used smokeless tobacco. Ms. Harwood reports that she does not drink alcohol.   Review of Systems CONSTITUTIONAL: No weight loss, fever, chills, weakness or fatigue.  HEENT: Eyes: No visual loss, blurred vision, double vision or yellow sclerae.No hearing loss, sneezing, congestion, runny nose or sore  throat.  SKIN: No rash or itching.  CARDIOVASCULAR: per hpi RESPIRATORY: per hpi  GASTROINTESTINAL: No anorexia, nausea, vomiting or diarrhea. No abdominal pain or blood.  GENITOURINARY: No burning on urination, no polyuria NEUROLOGICAL: No headache, dizziness, syncope, paralysis, ataxia, numbness or tingling in the extremities. No change in bowel or bladder control.  MUSCULOSKELETAL: No muscle, back pain, joint pain or stiffness.  LYMPHATICS: No enlarged nodes. No history of splenectomy.  PSYCHIATRIC: No history of depression or anxiety.  ENDOCRINOLOGIC: No reports of sweating, cold or heat intolerance. No polyuria or polydipsia.  [name]   Physical Examination Vitals:   01/07/17 1141  BP: 130/82  Pulse: 64   Vitals:   01/07/17 1141  Weight: 132 lb (59.9 kg)  Height: 5\' 2"  (1.575 m)    Gen: resting comfortably, no acute distress HEENT: no scleral icterus, pupils equal round and reactive, no palptable cervical adenopathy,  CV: RRR, no m/r/g, no jvd Resp: Clear to auscultation bilaterally GI: abdomen is soft, non-tender, non-distended, normal bowel sounds, no hepatosplenomegaly MSK: extremities are warm, no edema.  Skin: warm, no rash Neuro:  no focal deficits Psych: appropriate affect   Diagnostic Studies 11/2011 DSE Baseline LVEF 60-65%, negative for ischemia  10/2014 Lexiscan MPI  The study is normal.  This is a low risk study.  Nuclear stress EF: 60%.  There was no ST segment deviation noted during stress.  09/2015 echo Study Conclusions  - Left ventricle: The cavity size was normal. There was focal basal hypertrophy. Systolic function was normal. The estimated ejection fraction was in the range of 55% to 60%. Wall motion was normal; there were no regional wall motion abnormalities. Doppler parameters are consistent with abnormal left ventricular relaxation (grade 1 diastolic dysfunction). - Aortic valve: Mildly calcified annulus. Trileaflet;  normal thickness leaflets. Valve area (VTI): 2.18 cm^2. Valve area (Vmax): 2.24 cm^2. - Mitral valve: There was mild regurgitation. - Atrial septum: No defect or patent foramen ovale was identified. - Technically adequate study.  09/2015 Carotid US IMPRESSION: Mild amount of plaque at both carotid bifurcations, as above. Estimated bilateral ICA stenoses are less than 50%.    Assessment and Plan  1. Chest pain - long history of symptoms with negative ischemic testing - no recent symptoms, we will continue to monitor.   2. HTN - bp is at goal, continue current meds  3. Hyperlipidemia -off statin due to side effects. Last LDL 56 in 07/2016. At this time no strong indication for alternative  agent such as zetia or repatha. Follow lipid panel  4. CVA - no evidence of cardiogenic source - continue secondary prevention  F/u 1 year      Arnoldo Lenis, M.D.

## 2017-01-07 NOTE — Patient Instructions (Signed)

## 2017-02-24 DIAGNOSIS — Z23 Encounter for immunization: Secondary | ICD-10-CM | POA: Diagnosis not present

## 2017-03-04 DIAGNOSIS — H6993 Unspecified Eustachian tube disorder, bilateral: Secondary | ICD-10-CM | POA: Diagnosis not present

## 2017-03-04 DIAGNOSIS — Z6825 Body mass index (BMI) 25.0-25.9, adult: Secondary | ICD-10-CM | POA: Diagnosis not present

## 2017-03-04 DIAGNOSIS — R05 Cough: Secondary | ICD-10-CM | POA: Diagnosis not present

## 2017-04-01 DIAGNOSIS — L280 Lichen simplex chronicus: Secondary | ICD-10-CM | POA: Diagnosis not present

## 2017-04-01 DIAGNOSIS — L570 Actinic keratosis: Secondary | ICD-10-CM | POA: Diagnosis not present

## 2017-06-03 DIAGNOSIS — E213 Hyperparathyroidism, unspecified: Secondary | ICD-10-CM | POA: Diagnosis not present

## 2017-06-03 DIAGNOSIS — I10 Essential (primary) hypertension: Secondary | ICD-10-CM | POA: Diagnosis not present

## 2017-06-03 DIAGNOSIS — E7800 Pure hypercholesterolemia, unspecified: Secondary | ICD-10-CM | POA: Diagnosis not present

## 2017-06-03 DIAGNOSIS — E782 Mixed hyperlipidemia: Secondary | ICD-10-CM | POA: Diagnosis not present

## 2017-06-05 DIAGNOSIS — N183 Chronic kidney disease, stage 3 (moderate): Secondary | ICD-10-CM | POA: Diagnosis not present

## 2017-06-05 DIAGNOSIS — E213 Hyperparathyroidism, unspecified: Secondary | ICD-10-CM | POA: Diagnosis not present

## 2017-06-05 DIAGNOSIS — E782 Mixed hyperlipidemia: Secondary | ICD-10-CM | POA: Diagnosis not present

## 2017-06-05 DIAGNOSIS — K210 Gastro-esophageal reflux disease with esophagitis: Secondary | ICD-10-CM | POA: Diagnosis not present

## 2017-08-03 DIAGNOSIS — Z6825 Body mass index (BMI) 25.0-25.9, adult: Secondary | ICD-10-CM | POA: Diagnosis not present

## 2017-08-03 DIAGNOSIS — M25561 Pain in right knee: Secondary | ICD-10-CM | POA: Diagnosis not present

## 2017-11-16 DIAGNOSIS — S8002XA Contusion of left knee, initial encounter: Secondary | ICD-10-CM | POA: Diagnosis not present

## 2017-11-16 DIAGNOSIS — S0091XA Abrasion of unspecified part of head, initial encounter: Secondary | ICD-10-CM | POA: Diagnosis not present

## 2017-11-16 DIAGNOSIS — Z6824 Body mass index (BMI) 24.0-24.9, adult: Secondary | ICD-10-CM | POA: Diagnosis not present

## 2017-12-04 DIAGNOSIS — R635 Abnormal weight gain: Secondary | ICD-10-CM | POA: Diagnosis not present

## 2017-12-04 DIAGNOSIS — K210 Gastro-esophageal reflux disease with esophagitis: Secondary | ICD-10-CM | POA: Diagnosis not present

## 2017-12-04 DIAGNOSIS — N183 Chronic kidney disease, stage 3 (moderate): Secondary | ICD-10-CM | POA: Diagnosis not present

## 2017-12-04 DIAGNOSIS — I10 Essential (primary) hypertension: Secondary | ICD-10-CM | POA: Diagnosis not present

## 2017-12-04 DIAGNOSIS — R5383 Other fatigue: Secondary | ICD-10-CM | POA: Diagnosis not present

## 2017-12-04 DIAGNOSIS — E213 Hyperparathyroidism, unspecified: Secondary | ICD-10-CM | POA: Diagnosis not present

## 2017-12-04 DIAGNOSIS — E782 Mixed hyperlipidemia: Secondary | ICD-10-CM | POA: Diagnosis not present

## 2017-12-09 DIAGNOSIS — R5383 Other fatigue: Secondary | ICD-10-CM | POA: Diagnosis not present

## 2017-12-09 DIAGNOSIS — Z0001 Encounter for general adult medical examination with abnormal findings: Secondary | ICD-10-CM | POA: Diagnosis not present

## 2017-12-09 DIAGNOSIS — E782 Mixed hyperlipidemia: Secondary | ICD-10-CM | POA: Diagnosis not present

## 2017-12-09 DIAGNOSIS — M3509 Sicca syndrome with other organ involvement: Secondary | ICD-10-CM | POA: Diagnosis not present

## 2017-12-09 DIAGNOSIS — I63511 Cerebral infarction due to unspecified occlusion or stenosis of right middle cerebral artery: Secondary | ICD-10-CM | POA: Diagnosis not present

## 2017-12-09 DIAGNOSIS — Z6825 Body mass index (BMI) 25.0-25.9, adult: Secondary | ICD-10-CM | POA: Diagnosis not present

## 2017-12-09 DIAGNOSIS — E213 Hyperparathyroidism, unspecified: Secondary | ICD-10-CM | POA: Diagnosis not present

## 2017-12-09 DIAGNOSIS — N183 Chronic kidney disease, stage 3 (moderate): Secondary | ICD-10-CM | POA: Diagnosis not present

## 2017-12-09 DIAGNOSIS — M545 Low back pain: Secondary | ICD-10-CM | POA: Diagnosis not present

## 2017-12-09 DIAGNOSIS — K210 Gastro-esophageal reflux disease with esophagitis: Secondary | ICD-10-CM | POA: Diagnosis not present

## 2017-12-31 ENCOUNTER — Other Ambulatory Visit: Payer: Self-pay | Admitting: Cardiology

## 2018-01-05 DIAGNOSIS — Z1231 Encounter for screening mammogram for malignant neoplasm of breast: Secondary | ICD-10-CM | POA: Diagnosis not present

## 2018-01-19 ENCOUNTER — Encounter: Payer: Self-pay | Admitting: *Deleted

## 2018-01-20 ENCOUNTER — Encounter: Payer: Self-pay | Admitting: *Deleted

## 2018-01-20 ENCOUNTER — Ambulatory Visit: Payer: PPO | Admitting: Cardiology

## 2018-01-20 ENCOUNTER — Encounter: Payer: Self-pay | Admitting: Cardiology

## 2018-01-20 VITALS — BP 125/79 | HR 60 | Ht 62.0 in | Wt 136.6 lb

## 2018-01-20 DIAGNOSIS — I10 Essential (primary) hypertension: Secondary | ICD-10-CM

## 2018-01-20 DIAGNOSIS — Z23 Encounter for immunization: Secondary | ICD-10-CM

## 2018-01-20 DIAGNOSIS — R0789 Other chest pain: Secondary | ICD-10-CM | POA: Diagnosis not present

## 2018-01-20 DIAGNOSIS — E782 Mixed hyperlipidemia: Secondary | ICD-10-CM | POA: Diagnosis not present

## 2018-01-20 NOTE — Progress Notes (Signed)
Clinical Summary Peggy Ramirez is a 84 y.o.female  seen today for follow up of the following medical problems.   1. Chest pain - from previous clinic notes has history of chest pain and SOB, normal DSE in 11/2011 - 10/2014 Lexiscan showed no ischemia   - mild SOB at times with high levels of activity. Denies any recent chest pain.   - occasional chest pain at times, SOB often with bending over. Chest pani happens most often with laying down. No recent edema, no recent cough or wheezing. Can have some sinus congestion.  - poor compliance with antacids.    2. HTN - compliant with meds   3. CVA -  admit 09/2015 with left hand weakness. MRI showed small cortical infarct in the posterior right MCA territory.  - 30 day event monitor without significant arrhythmias  4. HL - atorva caused muscle aches, symptoms improved after stopping about 1 month ago.  - has not been on statin   Past Medical History:  Diagnosis Date  . Chronic constipation   . Chronic left lower quadrant pain   . Headache(784.0)   . High cholesterol   . Hydrosalpinx    LEFT  . Hypertension   . Lupus ([place])   . 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 Ovarian fibroma    RIGHT  . Pelvic adhesions   . Sjogren's syndrome ([place])      Allergies  Allergen Reactions  . Atorvastatin Other (See Comments)    Muscle aches  . Cellcept [Mycophenolate Mofetil]   . Levofloxacin     REACTION: rash  . Medrol [Methylprednisolone]   . Mycophenolate Mofetil   . Penicillins     REACTION: itching  . Sulfonamide Derivatives     REACTION: nausea  . Topiramate   . Verapamil     Felt shakey     Current Outpatient Medications  Medication Sig Dispense Refill  . Ascorbic Acid (VITAMIN C) 1000 MG tablet Take 1,000 mg by mouth daily.    [name] aspirin EC 81 MG tablet Take 81 mg by mouth daily.    . Biotin 10 MG CAPS Take 10 mg by mouth daily.    . Cholecalciferol (VITAMIN D-3 PO) Take 1 tablet  by mouth daily.    . folic acid (FOLVITE) 417 MCG tablet Take 400 mcg by mouth daily.    . hydrochlorothiazide (MICROZIDE) 12.5 MG capsule Take 1 capsule by mouth daily.    . hydrochlorothiazide (MICROZIDE) 12.5 MG capsule TAKE 1 CAPSULE BY MOUTH ONCE DAILY 90 capsule 3  . loratadine (CLARITIN) 10 MG tablet Take 10 mg by mouth daily.    [name] loteprednol (LOTEMAX) 0.2 % SUSP Place 1 drop into both eyes.    . pantoprazole (PROTONIX) 40 MG tablet Take 40 mg by mouth 2 (two) times daily.     . polyethylene glycol powder (MIRALAX) powder Take 17 g by mouth daily.      . potassium chloride (MICRO-K) 10 MEQ CR capsule Take 1 tablet by mouth Daily.    . ranitidine (ZANTAC) 150 MG tablet Take 1 tablet by mouth Twice daily.    . vitamin B-12 (CYANOCOBALAMIN) 1000 MCG tablet Take 1,000 mcg by mouth daily.    . vitamin E 1000 UNIT capsule Take 1,000 Units by mouth daily.     No current facility-administered medications for this visit.      Past Surgical History:  Procedure Laterality Date  . APPENDECTOMY  2002  .  LAPAROSCOPIC CHOLECYSTECTOMY  2002  . TONSILLECTOMY  1941  . TOTAL ABDOMINAL HYSTERECTOMY W/ BILATERAL SALPINGOOPHORECTOMY  2005  . VAGINAL HYSTERECTOMY  1981     Allergies  Allergen Reactions  . Atorvastatin Other (See Comments)    Muscle aches  . Cellcept [Mycophenolate Mofetil]   . Levofloxacin     REACTION: rash  . Medrol [Methylprednisolone]   . Mycophenolate Mofetil   . Penicillins     REACTION: itching  . Sulfonamide Derivatives     REACTION: nausea  . Topiramate   . Verapamil     Felt shakey      Family History  Problem Relation Age of Onset  . Heart disease Unknown   . Anxiety disorder Unknown   . Depression Unknown      Social History Peggy Ramirez reports that she has never smoked. She has never used smokeless tobacco. Peggy Ramirez reports that she does not drink alcohol.   Review of Systems CONSTITUTIONAL: No weight loss, fever, chills, weakness or  fatigue.  HEENT: Eyes: No visual loss, blurred vision, double vision or yellow sclerae.No hearing loss, sneezing, congestion, runny nose or sore throat.  SKIN: No rash or itching.  CARDIOVASCULAR: per hpi RESPIRATORY: No shortness of breath, cough or sputum.  GASTROINTESTINAL: No anorexia, nausea, vomiting or diarrhea. No abdominal pain or blood.  GENITOURINARY: No burning on urination, no polyuria NEUROLOGICAL: No headache, dizziness, syncope, paralysis, ataxia, numbness or tingling in the extremities. No change in bowel or bladder control.  MUSCULOSKELETAL: No muscle, back pain, joint pain or stiffness.  LYMPHATICS: No enlarged nodes. No history of splenectomy.  PSYCHIATRIC: No history of depression or anxiety.  ENDOCRINOLOGIC: No reports of sweating, cold or heat intolerance. No polyuria or polydipsia.  [name]   Physical Examination Vitals:   01/20/18 0858  BP: 125/79  Pulse: 60  SpO2: 99%   Vitals:   01/20/18 0858  Weight: 136 lb 9.6 oz (62 kg)  Height: 5\' 2"  (1.575 m)    Gen: resting comfortably, no acute distress HEENT: no scleral icterus, pupils equal round and reactive, no palptable cervical adenopathy,  CV: RRR, no m/r/g, no jvd Resp: Clear to auscultation bilaterally GI: abdomen is soft, non-tender, non-distended, normal bowel sounds, no hepatosplenomegaly MSK: extremities are warm, no edema.  Skin: warm, no rash Neuro:  no focal deficits Psych: appropriate affect   Diagnostic Studies  11/2011 DSE Baseline LVEF 60-65%, negative for ischemia  10/2014 Lexiscan MPI  The study is normal.  This is a low risk study.  Nuclear stress EF: 60%.  There was no ST segment deviation noted during stress.  09/2015 echo Study Conclusions  - Left ventricle: The cavity size was normal. There was focal basal hypertrophy. Systolic function was normal. The estimated ejection fraction was in the range of 55% to 60%. Wall motion was normal; there were no regional wall  motion abnormalities. Doppler parameters are consistent with abnormal left ventricular relaxation (grade 1 diastolic dysfunction). - Aortic valve: Mildly calcified annulus. Trileaflet; normal thickness leaflets. Valve area (VTI): 2.18 cm^2. Valve area (Vmax): 2.24 cm^2. - Mitral valve: There was mild regurgitation. - Atrial septum: No defect or patent foramen ovale was identified. - Technically adequate study.  09/2015 Carotid US IMPRESSION: Mild amount of plaque at both carotid bifurcations, as above. Estimated bilateral ICA stenoses are less than 50%.   Assessment and Plan  1. Chest pain - long history of symptoms with negative ischemic testing - recent symptoms occurring with laying down, recent poor compliance with  her PPI. Encouraged taking PPI regularly and following symptoms, would not repeat ischemic testing at this time.  - EKG today shows SR, no acute ischemic changes.   2. HTN - at goal, cotninue current meds  3. Hyperlipidemia -off statin due to side effects. Last LDL 56 in 07/2016. At this time no strong indication for alternative agent such as zetia or repatha. -request labs from pcp      Arnoldo Lenis, M.D

## 2018-01-20 NOTE — Patient Instructions (Signed)

## 2018-03-27 DIAGNOSIS — K210 Gastro-esophageal reflux disease with esophagitis: Secondary | ICD-10-CM | POA: Diagnosis not present

## 2018-03-27 DIAGNOSIS — E782 Mixed hyperlipidemia: Secondary | ICD-10-CM | POA: Diagnosis not present

## 2018-03-27 DIAGNOSIS — N183 Chronic kidney disease, stage 3 (moderate): Secondary | ICD-10-CM | POA: Diagnosis not present

## 2018-03-31 DIAGNOSIS — K210 Gastro-esophageal reflux disease with esophagitis: Secondary | ICD-10-CM | POA: Diagnosis not present

## 2018-03-31 DIAGNOSIS — E7800 Pure hypercholesterolemia, unspecified: Secondary | ICD-10-CM | POA: Diagnosis not present

## 2018-03-31 DIAGNOSIS — E213 Hyperparathyroidism, unspecified: Secondary | ICD-10-CM | POA: Diagnosis not present

## 2018-03-31 DIAGNOSIS — R5383 Other fatigue: Secondary | ICD-10-CM | POA: Diagnosis not present

## 2018-03-31 DIAGNOSIS — N183 Chronic kidney disease, stage 3 (moderate): Secondary | ICD-10-CM | POA: Diagnosis not present

## 2018-03-31 DIAGNOSIS — I10 Essential (primary) hypertension: Secondary | ICD-10-CM | POA: Diagnosis not present

## 2018-03-31 DIAGNOSIS — E782 Mixed hyperlipidemia: Secondary | ICD-10-CM | POA: Diagnosis not present

## 2018-04-07 DIAGNOSIS — N183 Chronic kidney disease, stage 3 (moderate): Secondary | ICD-10-CM | POA: Diagnosis not present

## 2018-04-07 DIAGNOSIS — I63511 Cerebral infarction due to unspecified occlusion or stenosis of right middle cerebral artery: Secondary | ICD-10-CM | POA: Diagnosis not present

## 2018-04-07 DIAGNOSIS — M3509 Sicca syndrome with other organ involvement: Secondary | ICD-10-CM | POA: Diagnosis not present

## 2018-04-07 DIAGNOSIS — Z6824 Body mass index (BMI) 24.0-24.9, adult: Secondary | ICD-10-CM | POA: Diagnosis not present

## 2018-04-07 DIAGNOSIS — E782 Mixed hyperlipidemia: Secondary | ICD-10-CM | POA: Diagnosis not present

## 2018-04-07 DIAGNOSIS — M25551 Pain in right hip: Secondary | ICD-10-CM | POA: Diagnosis not present

## 2018-04-07 DIAGNOSIS — E213 Hyperparathyroidism, unspecified: Secondary | ICD-10-CM | POA: Diagnosis not present

## 2018-04-07 DIAGNOSIS — R5383 Other fatigue: Secondary | ICD-10-CM | POA: Diagnosis not present

## 2018-07-16 ENCOUNTER — Encounter: Payer: Self-pay | Admitting: Gastroenterology

## 2018-08-10 DIAGNOSIS — I609 Nontraumatic subarachnoid hemorrhage, unspecified: Secondary | ICD-10-CM | POA: Diagnosis not present

## 2018-08-10 DIAGNOSIS — R1033 Periumbilical pain: Secondary | ICD-10-CM | POA: Diagnosis not present

## 2018-08-10 DIAGNOSIS — M25562 Pain in left knee: Secondary | ICD-10-CM | POA: Diagnosis not present

## 2018-08-10 DIAGNOSIS — Z6825 Body mass index (BMI) 25.0-25.9, adult: Secondary | ICD-10-CM | POA: Diagnosis not present

## 2018-08-20 DIAGNOSIS — E782 Mixed hyperlipidemia: Secondary | ICD-10-CM | POA: Diagnosis not present

## 2018-08-20 DIAGNOSIS — N183 Chronic kidney disease, stage 3 (moderate): Secondary | ICD-10-CM | POA: Diagnosis not present

## 2018-08-20 DIAGNOSIS — B373 Candidiasis of vulva and vagina: Secondary | ICD-10-CM | POA: Diagnosis not present

## 2018-08-20 DIAGNOSIS — M25551 Pain in right hip: Secondary | ICD-10-CM | POA: Diagnosis not present

## 2018-08-20 DIAGNOSIS — Z6825 Body mass index (BMI) 25.0-25.9, adult: Secondary | ICD-10-CM | POA: Diagnosis not present

## 2018-08-20 DIAGNOSIS — R252 Cramp and spasm: Secondary | ICD-10-CM | POA: Diagnosis not present

## 2018-08-20 DIAGNOSIS — M25562 Pain in left knee: Secondary | ICD-10-CM | POA: Diagnosis not present

## 2018-08-20 DIAGNOSIS — R1033 Periumbilical pain: Secondary | ICD-10-CM | POA: Diagnosis not present

## 2018-12-22 DIAGNOSIS — Z6824 Body mass index (BMI) 24.0-24.9, adult: Secondary | ICD-10-CM | POA: Diagnosis not present

## 2018-12-22 DIAGNOSIS — R42 Dizziness and giddiness: Secondary | ICD-10-CM | POA: Diagnosis not present

## 2018-12-22 DIAGNOSIS — R300 Dysuria: Secondary | ICD-10-CM | POA: Diagnosis not present

## 2019-01-07 ENCOUNTER — Other Ambulatory Visit: Payer: Self-pay | Admitting: Cardiology

## 2019-02-10 DIAGNOSIS — E782 Mixed hyperlipidemia: Secondary | ICD-10-CM | POA: Diagnosis not present

## 2019-02-10 DIAGNOSIS — R5383 Other fatigue: Secondary | ICD-10-CM | POA: Diagnosis not present

## 2019-02-10 DIAGNOSIS — E7800 Pure hypercholesterolemia, unspecified: Secondary | ICD-10-CM | POA: Diagnosis not present

## 2019-02-10 DIAGNOSIS — Z23 Encounter for immunization: Secondary | ICD-10-CM | POA: Diagnosis not present

## 2019-02-10 DIAGNOSIS — E559 Vitamin D deficiency, unspecified: Secondary | ICD-10-CM | POA: Diagnosis not present

## 2019-02-10 DIAGNOSIS — I10 Essential (primary) hypertension: Secondary | ICD-10-CM | POA: Diagnosis not present

## 2019-02-10 DIAGNOSIS — E213 Hyperparathyroidism, unspecified: Secondary | ICD-10-CM | POA: Diagnosis not present

## 2019-02-21 DIAGNOSIS — M25551 Pain in right hip: Secondary | ICD-10-CM | POA: Diagnosis not present

## 2019-02-21 DIAGNOSIS — R1033 Periumbilical pain: Secondary | ICD-10-CM | POA: Diagnosis not present

## 2019-02-21 DIAGNOSIS — Z6824 Body mass index (BMI) 24.0-24.9, adult: Secondary | ICD-10-CM | POA: Diagnosis not present

## 2019-02-21 DIAGNOSIS — I63511 Cerebral infarction due to unspecified occlusion or stenosis of right middle cerebral artery: Secondary | ICD-10-CM | POA: Diagnosis not present

## 2019-02-21 DIAGNOSIS — E213 Hyperparathyroidism, unspecified: Secondary | ICD-10-CM | POA: Diagnosis not present

## 2019-02-21 DIAGNOSIS — R252 Cramp and spasm: Secondary | ICD-10-CM | POA: Diagnosis not present

## 2019-02-21 DIAGNOSIS — M3509 Sicca syndrome with other organ involvement: Secondary | ICD-10-CM | POA: Diagnosis not present

## 2019-02-21 DIAGNOSIS — K2100 Gastro-esophageal reflux disease with esophagitis, without bleeding: Secondary | ICD-10-CM | POA: Diagnosis not present

## 2019-03-14 DIAGNOSIS — Z6824 Body mass index (BMI) 24.0-24.9, adult: Secondary | ICD-10-CM | POA: Diagnosis not present

## 2019-03-14 DIAGNOSIS — S29019A Strain of muscle and tendon of unspecified wall of thorax, initial encounter: Secondary | ICD-10-CM | POA: Diagnosis not present

## 2019-04-09 ENCOUNTER — Other Ambulatory Visit: Payer: Self-pay | Admitting: Cardiology

## 2019-05-27 DIAGNOSIS — K2100 Gastro-esophageal reflux disease with esophagitis, without bleeding: Secondary | ICD-10-CM | POA: Diagnosis not present

## 2019-05-27 DIAGNOSIS — I10 Essential (primary) hypertension: Secondary | ICD-10-CM | POA: Diagnosis not present

## 2019-08-17 DIAGNOSIS — E782 Mixed hyperlipidemia: Secondary | ICD-10-CM | POA: Diagnosis not present

## 2019-08-17 DIAGNOSIS — K2100 Gastro-esophageal reflux disease with esophagitis, without bleeding: Secondary | ICD-10-CM | POA: Diagnosis not present

## 2019-08-17 DIAGNOSIS — I10 Essential (primary) hypertension: Secondary | ICD-10-CM | POA: Diagnosis not present

## 2019-08-17 DIAGNOSIS — N183 Chronic kidney disease, stage 3 unspecified: Secondary | ICD-10-CM | POA: Diagnosis not present

## 2019-08-24 DIAGNOSIS — E46 Unspecified protein-calorie malnutrition: Secondary | ICD-10-CM | POA: Diagnosis not present

## 2019-08-24 DIAGNOSIS — I63511 Cerebral infarction due to unspecified occlusion or stenosis of right middle cerebral artery: Secondary | ICD-10-CM | POA: Diagnosis not present

## 2019-08-24 DIAGNOSIS — R252 Cramp and spasm: Secondary | ICD-10-CM | POA: Diagnosis not present

## 2019-08-24 DIAGNOSIS — M3509 Sicca syndrome with other organ involvement: Secondary | ICD-10-CM | POA: Diagnosis not present

## 2019-08-24 DIAGNOSIS — Z23 Encounter for immunization: Secondary | ICD-10-CM | POA: Diagnosis not present

## 2019-08-24 DIAGNOSIS — Z6823 Body mass index (BMI) 23.0-23.9, adult: Secondary | ICD-10-CM | POA: Diagnosis not present

## 2019-09-26 DIAGNOSIS — K2100 Gastro-esophageal reflux disease with esophagitis, without bleeding: Secondary | ICD-10-CM | POA: Diagnosis not present

## 2019-09-26 DIAGNOSIS — E7849 Other hyperlipidemia: Secondary | ICD-10-CM | POA: Diagnosis not present

## 2019-09-26 DIAGNOSIS — I129 Hypertensive chronic kidney disease with stage 1 through stage 4 chronic kidney disease, or unspecified chronic kidney disease: Secondary | ICD-10-CM | POA: Diagnosis not present

## 2019-09-26 DIAGNOSIS — N183 Chronic kidney disease, stage 3 unspecified: Secondary | ICD-10-CM | POA: Diagnosis not present

## 2019-10-03 DIAGNOSIS — T1501XA Foreign body in cornea, right eye, initial encounter: Secondary | ICD-10-CM | POA: Diagnosis not present

## 2019-10-03 DIAGNOSIS — T1511XA Foreign body in conjunctival sac, right eye, initial encounter: Secondary | ICD-10-CM | POA: Diagnosis not present

## 2019-10-10 ENCOUNTER — Encounter: Payer: Self-pay | Admitting: *Deleted

## 2019-10-11 ENCOUNTER — Encounter: Payer: Self-pay | Admitting: Cardiology

## 2019-10-11 ENCOUNTER — Other Ambulatory Visit: Payer: Self-pay

## 2019-10-11 ENCOUNTER — Ambulatory Visit: Payer: Medicare Other | Admitting: Cardiology

## 2019-10-11 VITALS — BP 118/68 | HR 65 | Ht 60.0 in | Wt 127.2 lb

## 2019-10-11 DIAGNOSIS — E782 Mixed hyperlipidemia: Secondary | ICD-10-CM

## 2019-10-11 DIAGNOSIS — I10 Essential (primary) hypertension: Secondary | ICD-10-CM

## 2019-10-11 DIAGNOSIS — R0789 Other chest pain: Secondary | ICD-10-CM | POA: Diagnosis not present

## 2019-10-11 NOTE — Patient Instructions (Signed)

## 2019-10-11 NOTE — Progress Notes (Signed)
Clinical Summary Peggy Ramirez is a 86 y.o.female seen today for follow up of the following medical problems.   1. Chest pain - from previous clinic notes has history of chest pain and SOB, normal DSE in 11/2011 - 10/2014 Lexiscan showed no ischemia   - mild chest tightness at times, nonspecific symptoms. No exertional symptoms   2. HTN -compliant with meds   3. CVA -  admit 09/2015 with left hand weakness. MRI showed small cortical infarct in the posterior right MCA territory.  - 30 day event monitor without significant arrhythmias  4. HL - atorva caused muscle aches, symptoms improved after stopping about 1 month ago.  -has not been on statin   Past Medical History:  Diagnosis Date  . Chronic constipation   . Chronic left lower quadrant pain   . Headache(784.0)   . High cholesterol   . Hydrosalpinx    LEFT  . Hypertension   . Lupus ([place])   . 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 Ovarian fibroma    RIGHT  . Pelvic adhesions   . Sjogren's syndrome ([place])      Allergies  Allergen Reactions  . Atorvastatin Other (See Comments)    Muscle aches  . Cellcept [Mycophenolate Mofetil]   . Levofloxacin     REACTION: rash  . Medrol [Methylprednisolone]   . Mycophenolate Mofetil   . Penicillins     REACTION: itching  . Sulfonamide Derivatives     REACTION: nausea  . Topiramate   . Verapamil     Felt shakey     Current Outpatient Medications  Medication Sig Dispense Refill  . Ascorbic Acid (VITAMIN C) 1000 MG tablet Take 1,000 mg by mouth daily.    [name] aspirin EC 81 MG tablet Take 81 mg by mouth daily.    . Cholecalciferol (VITAMIN D-3 PO) Take 1 tablet by mouth daily.    . hydrochlorothiazide (MICROZIDE) 12.5 MG capsule TAKE 1 CAPSULE BY MOUTH ONCE DAILY PATIENT  NEEDS  APPOINTMENT  FOR  FURTHER  REFILLS 60 capsule 0  . loratadine (CLARITIN) 10 MG tablet Take 10 mg by mouth daily.    . Multiple Vitamin (MULTIVITAMIN) tablet  Take 1 tablet by mouth daily.    . pantoprazole (PROTONIX) 40 MG tablet Take 40 mg by mouth 2 (two) times daily.     . polyethylene glycol powder (MIRALAX) powder Take 17 g by mouth daily.      . potassium chloride (MICRO-K) 10 MEQ CR capsule Take 1 tablet by mouth Daily.    . ranitidine (ZANTAC) 150 MG tablet Take 1 tablet by mouth 2 (two) times daily as needed.     [name] VITAMIN A PO Take 2,400 mcg by mouth daily.    . vitamin B-12 (CYANOCOBALAMIN) 1000 MCG tablet Take 1,000 mcg by mouth daily.    . vitamin E 1000 UNIT capsule Take 1,000 Units by mouth daily.     No current facility-administered medications for this visit.     Past Surgical History:  Procedure Laterality Date  . APPENDECTOMY  2002  . LAPAROSCOPIC CHOLECYSTECTOMY  2002  . TONSILLECTOMY  1941  . TOTAL ABDOMINAL HYSTERECTOMY W/ BILATERAL SALPINGOOPHORECTOMY  2005  . VAGINAL HYSTERECTOMY  1981     Allergies  Allergen Reactions  . Atorvastatin Other (See Comments)    Muscle aches  . Cellcept [Mycophenolate Mofetil]   . Levofloxacin     REACTION: rash  . Medrol [Methylprednisolone]   .  Mycophenolate Mofetil   . Penicillins     REACTION: itching  . Sulfonamide Derivatives     REACTION: nausea  . Topiramate   . Verapamil     Felt shakey      Family History  Problem Relation Age of Onset  . Heart disease Other   . Anxiety disorder Other   . Depression Other      Social History Ms. Gloster reports that she has never smoked. She has never used smokeless tobacco. Ms. Nottingham reports no history of alcohol use.   Review of Systems CONSTITUTIONAL: No weight loss, fever, chills, weakness or fatigue.  HEENT: Eyes: No visual loss, blurred vision, double vision or yellow sclerae.No hearing loss, sneezing, congestion, runny nose or sore throat.  SKIN: No rash or itching.  CARDIOVASCULAR: per hpi RESPIRATORY: No shortness of breath, cough or sputum.  GASTROINTESTINAL: No anorexia, nausea, vomiting or diarrhea. No  abdominal pain or blood.  GENITOURINARY: No burning on urination, no polyuria NEUROLOGICAL: No headache, dizziness, syncope, paralysis, ataxia, numbness or tingling in the extremities. No change in bowel or bladder control.  MUSCULOSKELETAL: No muscle, back pain, joint pain or stiffness.  LYMPHATICS: No enlarged nodes. No history of splenectomy.  PSYCHIATRIC: No history of depression or anxiety.  ENDOCRINOLOGIC: No reports of sweating, cold or heat intolerance. No polyuria or polydipsia.  [name]   Physical Examination Today's Vitals   10/11/19 1547  BP: 118/68  Pulse: 65  SpO2: 97%  Weight: 127 lb 3.2 oz (57.7 kg)  Height: 5' (1.524 m)   Body mass index is 24.84 kg/m.  Gen: resting comfortably, no acute distress HEENT: no scleral icterus, pupils equal round and reactive, no palptable cervical adenopathy,  CV: RRR, no mr/g, no jvd Resp: Clear to auscultation bilaterally GI: abdomen is soft, non-tender, non-distended, normal bowel sounds, no hepatosplenomegaly MSK: extremities are warm, no edema.  Skin: warm, no rash Neuro:  no focal deficits Psych: appropriate affect   Diagnostic Studies  11/2011 DSE Baseline LVEF 60-65%, negative for ischemia  10/2014 Lexiscan MPI  The study is normal.  This is a low risk study.  Nuclear stress EF: 60%.  There was no ST segment deviation noted during stress.  09/2015 echo Study Conclusions  - Left ventricle: The cavity size was normal. There was focal basal hypertrophy. Systolic function was normal. The estimated ejection fraction was in the range of 55% to 60%. Wall motion was normal; there were no regional wall motion abnormalities. Doppler parameters are consistent with abnormal left ventricular relaxation (grade 1 diastolic dysfunction). - Aortic valve: Mildly calcified annulus. Trileaflet; normal thickness leaflets. Valve area (VTI): 2.18 cm^2. Valve area (Vmax): 2.24 cm^2. - Mitral valve: There was mild  regurgitation. - Atrial septum: No defect or patent foramen ovale was identified. - Technically adequate study.  09/2015 Carotid US IMPRESSION: Mild amount of plaque at both carotid bifurcations, as above. Estimated bilateral ICA stenoses are less than 50%.   Assessment and Plan   1. Chest pain - long history of symptomswith negative ischemic testing - no specific cardiac symptoms recently, continue to monitor.    2. HTN - at [facility], continue current meds  3. Hyperlipidemia -off statin due to side effects. LDL of therapy is reasonable, don't see strong indciation to consider statin alternatives at this time.       Arnoldo Lenis, M.D.

## 2019-10-26 DIAGNOSIS — E7849 Other hyperlipidemia: Secondary | ICD-10-CM | POA: Diagnosis not present

## 2019-10-26 DIAGNOSIS — K2100 Gastro-esophageal reflux disease with esophagitis, without bleeding: Secondary | ICD-10-CM | POA: Diagnosis not present

## 2019-10-26 DIAGNOSIS — I129 Hypertensive chronic kidney disease with stage 1 through stage 4 chronic kidney disease, or unspecified chronic kidney disease: Secondary | ICD-10-CM | POA: Diagnosis not present

## 2019-10-26 DIAGNOSIS — N183 Chronic kidney disease, stage 3 unspecified: Secondary | ICD-10-CM | POA: Diagnosis not present

## 2019-11-04 DIAGNOSIS — L293 Anogenital pruritus, unspecified: Secondary | ICD-10-CM | POA: Diagnosis not present

## 2019-11-04 DIAGNOSIS — R300 Dysuria: Secondary | ICD-10-CM | POA: Diagnosis not present

## 2019-11-04 DIAGNOSIS — R21 Rash and other nonspecific skin eruption: Secondary | ICD-10-CM | POA: Diagnosis not present

## 2019-11-24 DIAGNOSIS — J069 Acute upper respiratory infection, unspecified: Secondary | ICD-10-CM | POA: Diagnosis not present

## 2019-11-24 DIAGNOSIS — L259 Unspecified contact dermatitis, unspecified cause: Secondary | ICD-10-CM | POA: Diagnosis not present

## 2019-11-25 DIAGNOSIS — I129 Hypertensive chronic kidney disease with stage 1 through stage 4 chronic kidney disease, or unspecified chronic kidney disease: Secondary | ICD-10-CM | POA: Diagnosis not present

## 2019-11-25 DIAGNOSIS — K2100 Gastro-esophageal reflux disease with esophagitis, without bleeding: Secondary | ICD-10-CM | POA: Diagnosis not present

## 2019-11-25 DIAGNOSIS — N183 Chronic kidney disease, stage 3 unspecified: Secondary | ICD-10-CM | POA: Diagnosis not present

## 2019-11-25 DIAGNOSIS — E7849 Other hyperlipidemia: Secondary | ICD-10-CM | POA: Diagnosis not present

## 2019-12-14 ENCOUNTER — Other Ambulatory Visit: Payer: Self-pay

## 2019-12-14 ENCOUNTER — Inpatient Hospital Stay (HOSPITAL_COMMUNITY)
Admission: EM | Admit: 2019-12-14 | Discharge: 2019-12-17 | DRG: 481 | Disposition: A | Discharge status: Home Health | Payer: Medicare Other | Attending: Internal Medicine | Admitting: Internal Medicine

## 2019-12-14 ENCOUNTER — Emergency Department (HOSPITAL_COMMUNITY): Payer: Medicare Other

## 2019-12-14 ENCOUNTER — Telehealth: Payer: Self-pay | Admitting: Orthopedic Surgery

## 2019-12-14 ENCOUNTER — Encounter (HOSPITAL_COMMUNITY): Payer: Self-pay | Admitting: Emergency Medicine

## 2019-12-14 DIAGNOSIS — Z90710 Acquired absence of both cervix and uterus: Secondary | ICD-10-CM | POA: Diagnosis not present

## 2019-12-14 DIAGNOSIS — E7800 Pure hypercholesterolemia, unspecified: Secondary | ICD-10-CM | POA: Diagnosis present

## 2019-12-14 DIAGNOSIS — Z881 Allergy status to other antibiotic agents status: Secondary | ICD-10-CM

## 2019-12-14 DIAGNOSIS — M25552 Pain in left hip: Secondary | ICD-10-CM | POA: Diagnosis not present

## 2019-12-14 DIAGNOSIS — T148XXA Other injury of unspecified body region, initial encounter: Secondary | ICD-10-CM

## 2019-12-14 DIAGNOSIS — E876 Hypokalemia: Secondary | ICD-10-CM | POA: Diagnosis present

## 2019-12-14 DIAGNOSIS — R338 Other retention of urine: Secondary | ICD-10-CM | POA: Diagnosis not present

## 2019-12-14 DIAGNOSIS — W19XXXA Unspecified fall, initial encounter: Secondary | ICD-10-CM | POA: Diagnosis not present

## 2019-12-14 DIAGNOSIS — Z880 Allergy status to penicillin: Secondary | ICD-10-CM | POA: Diagnosis not present

## 2019-12-14 DIAGNOSIS — W06XXXA Fall from bed, initial encounter: Secondary | ICD-10-CM | POA: Diagnosis present

## 2019-12-14 DIAGNOSIS — R21 Rash and other nonspecific skin eruption: Secondary | ICD-10-CM | POA: Diagnosis not present

## 2019-12-14 DIAGNOSIS — S59902A Unspecified injury of left elbow, initial encounter: Secondary | ICD-10-CM | POA: Diagnosis not present

## 2019-12-14 DIAGNOSIS — K5909 Other constipation: Secondary | ICD-10-CM | POA: Diagnosis present

## 2019-12-14 DIAGNOSIS — Z8673 Personal history of transient ischemic attack (TIA), and cerebral infarction without residual deficits: Secondary | ICD-10-CM

## 2019-12-14 DIAGNOSIS — Z7982 Long term (current) use of aspirin: Secondary | ICD-10-CM | POA: Diagnosis not present

## 2019-12-14 DIAGNOSIS — Z90722 Acquired absence of ovaries, bilateral: Secondary | ICD-10-CM

## 2019-12-14 DIAGNOSIS — I10 Essential (primary) hypertension: Secondary | ICD-10-CM | POA: Diagnosis not present

## 2019-12-14 DIAGNOSIS — S5002XA Contusion of left elbow, initial encounter: Secondary | ICD-10-CM | POA: Diagnosis not present

## 2019-12-14 DIAGNOSIS — S72002A Fracture of unspecified part of neck of left femur, initial encounter for closed fracture: Secondary | ICD-10-CM

## 2019-12-14 DIAGNOSIS — M5136 Other intervertebral disc degeneration, lumbar region: Secondary | ICD-10-CM | POA: Diagnosis not present

## 2019-12-14 DIAGNOSIS — Z79899 Other long term (current) drug therapy: Secondary | ICD-10-CM

## 2019-12-14 DIAGNOSIS — E785 Hyperlipidemia, unspecified: Secondary | ICD-10-CM | POA: Diagnosis not present

## 2019-12-14 DIAGNOSIS — N7011 Chronic salpingitis: Secondary | ICD-10-CM | POA: Diagnosis present

## 2019-12-14 DIAGNOSIS — R339 Retention of urine, unspecified: Secondary | ICD-10-CM | POA: Diagnosis present

## 2019-12-14 DIAGNOSIS — S8992XA Unspecified injury of left lower leg, initial encounter: Secondary | ICD-10-CM | POA: Diagnosis not present

## 2019-12-14 DIAGNOSIS — S72002D Fracture of unspecified part of neck of left femur, subsequent encounter for closed fracture with routine healing: Secondary | ICD-10-CM | POA: Diagnosis not present

## 2019-12-14 DIAGNOSIS — Z20822 Contact with and (suspected) exposure to covid-19: Secondary | ICD-10-CM | POA: Diagnosis not present

## 2019-12-14 DIAGNOSIS — Z419 Encounter for procedure for purposes other than remedying health state, unspecified: Secondary | ICD-10-CM

## 2019-12-14 DIAGNOSIS — Z9049 Acquired absence of other specified parts of digestive tract: Secondary | ICD-10-CM

## 2019-12-14 DIAGNOSIS — N052 Unspecified nephritic syndrome with diffuse membranous glomerulonephritis: Secondary | ICD-10-CM | POA: Diagnosis present

## 2019-12-14 DIAGNOSIS — M3500 Sicca syndrome, unspecified: Secondary | ICD-10-CM | POA: Diagnosis present

## 2019-12-14 DIAGNOSIS — Y92003 Bedroom of unspecified non-institutional (private) residence as the place of occurrence of the external cause: Secondary | ICD-10-CM | POA: Diagnosis not present

## 2019-12-14 DIAGNOSIS — K219 Gastro-esophageal reflux disease without esophagitis: Secondary | ICD-10-CM | POA: Diagnosis present

## 2019-12-14 DIAGNOSIS — Z888 Allergy status to other drugs, medicaments and biological substances status: Secondary | ICD-10-CM | POA: Diagnosis not present

## 2019-12-14 DIAGNOSIS — S72012A Unspecified intracapsular fracture of left femur, initial encounter for closed fracture: Secondary | ICD-10-CM | POA: Diagnosis not present

## 2019-12-14 DIAGNOSIS — I639 Cerebral infarction, unspecified: Secondary | ICD-10-CM | POA: Diagnosis not present

## 2019-12-14 DIAGNOSIS — E210 Primary hyperparathyroidism: Secondary | ICD-10-CM | POA: Diagnosis not present

## 2019-12-14 LAB — CBC WITH DIFFERENTIAL/PLATELET
Abs Immature Granulocytes: 0.02 10*3/uL (ref 0.00–0.07)
Basophils Absolute: 0 10*3/uL (ref 0.0–0.1)
Basophils Relative: 0 %
Eosinophils Absolute: 0 10*3/uL (ref 0.0–0.5)
Eosinophils Relative: 1 %
HCT: 39.6 % (ref 36.0–46.0)
Hemoglobin: 13.4 g/dL (ref 12.0–15.0)
Immature Granulocytes: 0 %
Lymphocytes Relative: 7 %
Lymphs Abs: 0.4 10*3/uL — ABNORMAL LOW (ref 0.7–4.0)
MCH: 32.4 pg (ref 26.0–34.0)
MCHC: 33.8 g/dL (ref 30.0–36.0)
MCV: 95.9 fL (ref 80.0–100.0)
Monocytes Absolute: 0.6 10*3/uL (ref 0.1–1.0)
Monocytes Relative: 10 %
Neutro Abs: 5.4 10*3/uL (ref 1.7–7.7)
Neutrophils Relative %: 82 %
Platelets: 193 10*3/uL (ref 150–400)
RBC: 4.13 MIL/uL (ref 3.87–5.11)
RDW: 12.5 % (ref 11.5–15.5)
WBC: 6.5 10*3/uL (ref 4.0–10.5)
nRBC: 0 % (ref 0.0–0.2)

## 2019-12-14 LAB — ABO/RH: ABO/RH(D): O NEG

## 2019-12-14 LAB — BASIC METABOLIC PANEL
Anion gap: 11 (ref 5–15)
BUN: 22 mg/dL (ref 8–23)
CO2: 25 mmol/L (ref 22–32)
Calcium: 10.4 mg/dL — ABNORMAL HIGH (ref 8.9–10.3)
Chloride: 97 mmol/L — ABNORMAL LOW (ref 98–111)
Creatinine, Ser: 0.81 mg/dL (ref 0.44–1.00)
GFR calc Af Amer: >60 mL/min (ref >60)
GFR calc non Af Amer: >60 mL/min (ref >60)
Glucose, Bld: 113 mg/dL — ABNORMAL HIGH (ref 70–99)
Potassium: 3.6 mmol/L (ref 3.5–5.1)
Sodium: 133 mmol/L — ABNORMAL LOW (ref 135–145)

## 2019-12-14 LAB — PROTIME-INR
INR: 1 (ref 0.8–1.2)
Prothrombin Time: 12.5 seconds (ref 11.4–15.2)

## 2019-12-14 LAB — URINALYSIS, ROUTINE W REFLEX MICROSCOPIC
Bilirubin Urine: NEGATIVE
Glucose, UA: NEGATIVE mg/dL
Hgb urine dipstick: NEGATIVE
Ketones, ur: NEGATIVE mg/dL
Leukocytes,Ua: NEGATIVE
Nitrite: NEGATIVE
Protein, ur: NEGATIVE mg/dL
Specific Gravity, Urine: 1.009 (ref 1.005–1.030)
pH: 7 (ref 5.0–8.0)

## 2019-12-14 LAB — SARS CORONAVIRUS 2 BY RT PCR (HOSPITAL ORDER, PERFORMED IN ~~LOC~~ HOSPITAL LAB): SARS Coronavirus 2: NEGATIVE

## 2019-12-14 LAB — TYPE AND SCREEN
ABO/RH(D): O NEG
Antibody Screen: NEGATIVE

## 2019-12-14 MED ORDER — MELATONIN 5 MG PO TABS
5.0000 mg | ORAL_TABLET | Freq: Every evening | ORAL | Status: DC | PRN
  Administered 2019-12-15 – 2019-12-16 (×3): 5 mg via ORAL
  Filled 2019-12-14 (×4): qty 1

## 2019-12-14 MED ORDER — FENTANYL CITRATE (PF) 100 MCG/2ML IJ SOLN
50.0000 ug | INTRAMUSCULAR | Status: DC | PRN
  Administered 2019-12-14: 50 ug via INTRAVENOUS
  Filled 2019-12-14: qty 2

## 2019-12-14 MED ORDER — SODIUM CHLORIDE 0.9 % IV SOLN: INTRAVENOUS | Status: DC

## 2019-12-14 NOTE — ED Provider Notes (Signed)
[facility] Provider Note   CSN: [id number] Arrival date & time: 12/14/19  1525     History Chief Complaint  Patient presents with  . Hip Pain    Peggy Ramirez is a 86 y.o. female.  She had a mechanical fall this morning stepping out of bed and missed stool.  Landed on her left hip.  Complaining of 10 out of 10 left hip pain.  Also injured her left elbow and left knee.  No loss of consciousness.  She was able to ambulate and went to a dentist appointment and then went to see her doctor because of her hip pain.  She had x-ray showing a hip fracture.  Her primary care doctor sent her by private vehicle here for evaluation.  She denies any chest pain shortness of breath neck or back pain numbness or weakness.  She has taken nothing for her pain.  The history is provided by the patient.  Hip Pain This is a new problem. The current episode started 6 to 12 hours ago. The problem occurs constantly. The problem has not changed since onset.Pertinent negatives include no chest pain, no abdominal pain, no headaches and no shortness of breath. The symptoms are aggravated by walking, bending and twisting. Nothing relieves the symptoms. She has tried nothing for the symptoms. The treatment provided no relief.       Past Medical History:  Diagnosis Date  . Chronic constipation   . Chronic left lower quadrant pain   . Headache(784.0)   . High cholesterol   . Hydrosalpinx    LEFT  . Hypertension   . Lupus ([place])   . 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 Ovarian fibroma    RIGHT  . Pelvic adhesions   . Sjogren's syndrome [facility])     Patient Active Problem List   Diagnosis Date Noted  . Essential hypertension 01/20/2018  . Acute CVA (cerebrovascular accident) ([place]) 10/16/2015  . Left hand weakness 10/15/2015  . Ptosis 12/22/2012  . Weakness 12/22/2012  . Primary hyperparathyroidism ([employer]) 08/05/2012  . Chest pain 03/18/2010  .  CONJUNCTIVITIS, ACUTE 04/26/2008  . MEMBRANOUS GLOMERULONEPHRITIS 04/26/2008  . [place] SYNDROME 04/26/2008  . ABDOMINAL PAIN, CHRONIC 04/26/2008  . NONSPECIFIC ABNORMAL UNSPEC CV FUNCTION STUDY 04/26/2008    Past Surgical History:  Procedure Laterality Date  . APPENDECTOMY  2002  . LAPAROSCOPIC CHOLECYSTECTOMY  2002  . TONSILLECTOMY  1941  . TOTAL ABDOMINAL HYSTERECTOMY W/ BILATERAL SALPINGOOPHORECTOMY  2005  . VAGINAL HYSTERECTOMY  1981     OB History   No obstetric history on file.     Family History  Problem Relation Age of Onset  . Heart disease Other   . Anxiety disorder Other   . Depression Other     Social History   Tobacco Use  . Smoking status: Never Smoker  . Smokeless tobacco: Never Used  Substance Use Topics  . Alcohol use: No    Alcohol/week: 0.0 standard drinks  . Drug use: Not on file    Home Medications Prior to Admission medications   Medication Sig Start Date End Date Taking? Authorizing Provider  Ascorbic Acid (VITAMIN C) 1000 MG tablet Take 1,000 mg by mouth daily.    [provider]  aspirin EC 81 MG tablet Take 81 mg by mouth daily.    [provider]  Cholecalciferol (VITAMIN D-3 PO) Take 1 tablet by mouth daily.    [provider]  hydrochlorothiazide (  MICROZIDE) 12.5 MG capsule TAKE 1 CAPSULE BY MOUTH ONCE DAILY PATIENT  NEEDS  APPOINTMENT  FOR  FURTHER  REFILLS 04/11/19   Arnoldo Lenis, MD  loratadine (CLARITIN) 10 MG tablet Take 10 mg by mouth daily.    [provider]  Multiple Vitamin (MULTIVITAMIN) tablet Take 1 tablet by mouth daily.    [provider]  pantoprazole (PROTONIX) 40 MG tablet Take 40 mg by mouth 2 (two) times daily.     [provider]  polyethylene glycol powder (MIRALAX) powder Take 17 g by mouth daily.      [provider]  potassium chloride (MICRO-K) 10 MEQ CR capsule Take 1 tablet by mouth Daily. 11/14/11   [provider]  zinc gluconate  50 MG tablet Take 50 mg by mouth daily.    [provider]    Allergies    Atorvastatin, Cellcept [mycophenolate mofetil], Levofloxacin, Medrol [methylprednisolone], Mycophenolate mofetil, Penicillins, Sulfonamide derivatives, Topiramate, and Verapamil  Review of Systems   Review of Systems  Constitutional: Negative for fever.  HENT: Negative for sore throat.   Eyes: Negative for visual disturbance.  Respiratory: Negative for shortness of breath.   Cardiovascular: Negative for chest pain.  Gastrointestinal: Negative for abdominal pain.  Genitourinary: Negative for dysuria.  Musculoskeletal: Positive for gait problem.  Skin: Negative for rash.  Neurological: Negative for headaches.    Physical Exam Updated Vital Signs BP 138/70   Pulse 65   Temp 97.8 F (36.6 C)   Resp 15   Ht 5' (1.524 m)   Wt 49.9 kg   SpO2 100%   BMI 21.48 kg/m   Physical Exam Vitals and nursing note reviewed.  Constitutional:      General: She is not in acute distress.    Appearance: She is well-developed.  HENT:     Head: Normocephalic and atraumatic.  Eyes:     Conjunctiva/sclera: Conjunctivae normal.  Cardiovascular:     Rate and Rhythm: Normal rate and regular rhythm.     Heart sounds: No murmur heard.   Pulmonary:     Effort: Pulmonary effort is normal. No respiratory distress.     Breath sounds: Normal breath sounds.  Abdominal:     Palpations: Abdomen is soft.     Tenderness: There is no abdominal tenderness.  Musculoskeletal:        General: Tenderness present. No deformity.     Cervical back: Neck supple.     Comments: She has full range of motion of her right upper and left upper extremities.  Some mild tenderness at her left olecranon.  Full range of motion right lower extremity.  Pain with any manipulation of her left lower extremity primarily at the hip.  Knee is mildly tender.  There is no shortening or rotation.  Distal pulses and sensation motor intact.  Skin:     General: Skin is warm and dry.     Capillary Refill: Capillary refill takes less than 2 seconds.  Neurological:     General: No focal deficit present.     Mental Status: She is alert.     ED Results / Procedures / Treatments   Labs (all labs ordered are listed, but only abnormal results are displayed) Labs Reviewed  BASIC METABOLIC PANEL - Abnormal; Notable for the following components:      Result Value   Sodium 133 (*)    Chloride 97 (*)    Glucose, Bld 113 (*)    Calcium 10.4 (*)  All other components within normal limits  CBC WITH DIFFERENTIAL/PLATELET - Abnormal; Notable for the following components:   Lymphs Abs 0.4 (*)    All other components within normal limits  URINALYSIS, ROUTINE W REFLEX MICROSCOPIC - Abnormal; Notable for the following components:   Color, Urine STRAW (*)    All other components within normal limits  COMPREHENSIVE METABOLIC PANEL - Abnormal; Notable for the following components:   Potassium 3.0 (*)    Total Protein 5.9 (*)    Albumin 3.1 (*)    All other components within normal limits  SARS CORONAVIRUS 2 BY RT PCR (HOSPITAL ORDER, [facility])  MRSA PCR SCREENING  PROTIME-INR  MAGNESIUM  CBC  PROTIME-INR  TYPE AND SCREEN  ABO/RH    EKG EKG Interpretation  Date/Time:  Wednesday December 14 2019 17:03:44 EDT Ventricular Rate:  65 PR Interval:    QRS Duration: 135 QT Interval:  423 QTC Calculation: 440 R Axis:   -48 Text Interpretation: Sinus rhythm Left bundle branch block LBBB progressed since prior 6/17 Confirmed by Aletta Edouard [phone]) on 12/14/2019 5:08:20 PM   Radiology DG Chest 1 View  Result Date: 12/14/2019 CLINICAL DATA:  86 year old female with fall and hip fracture. EXAM: CHEST  1 VIEW COMPARISON:  None. FINDINGS: No focal consolidation, pleural effusion or pneumothorax. The cardiac silhouette is within limits. Atherosclerotic calcification of the aorta. No acute osseous pathology. IMPRESSION:  No active disease. Electronically Signed   By: Anner Crete M.D.   On: 12/14/2019 18:27   DG Elbow Complete Left  Result Date: 12/14/2019 CLINICAL DATA:  Status post trauma. EXAM: LEFT ELBOW - COMPLETE 3+ VIEW COMPARISON:  None. FINDINGS: There is no evidence of fracture, dislocation, or joint effusion. There is no evidence of arthropathy or other focal bone abnormality. Soft tissues are unremarkable. IMPRESSION: Negative. Electronically Signed   By: Virgina Norfolk M.D.   On: 12/14/2019 18:35   DG Knee 2 Views Left  Result Date: 12/14/2019 CLINICAL DATA:  Status post fall. EXAM: LEFT KNEE - 1-2 VIEW COMPARISON:  None. FINDINGS: No evidence of fracture, dislocation, or joint effusion. Mild medial and lateral tibiofemoral compartment space narrowing is seen with mild medial and lateral chondrocalcinosis. Soft tissues are unremarkable. IMPRESSION: Mild degenerative changes and chondrocalcinosis without evidence of acute osseous abnormality. Electronically Signed   By: Virgina Norfolk M.D.   On: 12/14/2019 18:34   DG Hip Unilat With Pelvis 2-3 Views Left  Result Date: 12/14/2019 CLINICAL DATA:  LEFT hip pain post fall out of bed this morning EXAM: DG HIP (WITH OR WITHOUT PELVIS) 2-3V LEFT COMPARISON:  None FINDINGS: Osseous demineralization. Hip and SI joint spaces preserved. Impacted subcapital fracture of the LEFT femoral neck. No additional fracture, dislocation, or bone destruction. Degenerative disc disease changes at visualized lower lumbar spine. IMPRESSION: Impacted subcapital fracture of the LEFT femoral neck. Osseous demineralization. Electronically Signed   By: Lavonia Dana M.D.   On: 12/14/2019 17:43    Procedures Procedures (including critical care time)  Medications Ordered in ED Medications  0.9 %  sodium chloride infusion (has no administration in time range)  fentaNYL (SUBLIMAZE) injection 50 mcg (has no administration in time range)    ED Course  I have reviewed the  triage vital signs and the nursing notes.  Pertinent labs & imaging results that were available during my care of the patient were reviewed by me and considered in my medical decision making (see chart for details).  Clinical Course  as of Dec 15 1143  Wed Dec 14, 2019  1743 X-ray interpreted by me as left femoral neck fracture impacted   [MB]  1833 Discussed with Dr. Doreatha Martin orthopedics on-call.  He asked that the patient be admitted to the hospital service for a bed at [facility] with anticipation for surgery tomorrow.   [MB]  2006 Discussed with [facility] hospitalist Dr. Josph Macho who will see the patient for admission.  I updated the patient and her son regarding the plan for transfer to [facility] when bed available.   [MB]    Clinical Course User Index [MB] Hayden Rasmussen, MD   MDM Rules/Calculators/A&P                         This patient complains of fall and hip pain; this involves an extensive number of treatment Options and is a complaint that carries with it a high risk of complications and Morbidity. The differential includes hip fracture, contusion, dislocation, pelvic fracture.   I ordered, reviewed and interpreted labs, which included CBC with normal white count normal hemoglobin, chemistries fairly normal, normal INR normal Covid I ordered medication IV pain medication with improvement in her symptoms I ordered imaging studies which included chest x-ray pelvis left hip and left elbow left knee and I independently    visualized and interpreted imaging which showed left hip subcapital impacted fracture Additional history obtained from patient's son and patient's primary care doctor who called me regarding the patient Previous records obtained and reviewed in epic, no recent admissions I consulted Dr. Doreatha Martin orthopedics and Dr Josph Macho and discussed lab and imaging findings  Critical Interventions: None  After the interventions stated above, I reevaluated the patient and found  patient's pain to be improved.  I reviewed the results of her imaging and other work-up with her and her son.  I relayed recommendations from orthopedics for admission to [facility] for operative repair.  I answered their questions to the best of my ability.   Final Clinical Impression(s) / ED Diagnoses Final diagnoses:  Closed fracture of left hip, initial encounter ([place])  Fall, initial encounter  Contusion of left elbow, initial encounter    Rx / DC Orders ED Discharge Orders    None       Hayden Rasmussen, MD 12/15/19 1151

## 2019-12-14 NOTE — Progress Notes (Signed)
Ortho Note  Reviewed imaging. Left impacted femoral neck. Will require surgical fixation. Recommend transfer to [facility] for surgery tomorrow. NPO after midnight. Consult to follow when patient arrives.  Shona Needles, MD [facility] [phone] (office) [email or web address]

## 2019-12-14 NOTE — Telephone Encounter (Signed)
Call received from [facility] per Rodman Pickle and per Dr Quintin Alto - initially asked if may speak with Dr Aline Brochure or page him. Page number given as Dr Aline Brochure is not in clinic this afternoon, nor do we have a provider in clinic this afternoon. Rodman Pickle relays that patient is currently in their office, and has a left hip fracture - asking if patient is best to be transported to emergency room.  I conferred with clinic supervisor Abigail Butts, and relayed that patient is to be transported to the emergency room, as we cannot advise beyond that. Aware that the on call orthopaedist would be paged, although Dr Quintin Alto may elect to also try paging Dr Aline Brochure.

## 2019-12-14 NOTE — H&P (Signed)
TRH H&P    Patient Demographics:    Peggy Ramirez, is a 86 y.o. female  MRN: [id number]  DOB - 1934-02-28  Admit Date - 12/14/2019  Referring MD/NP/PA: Dr. Melina Copa  Outpatient Primary MD for the patient is Sasser, Silvestre Moment, MD  Patient coming from: Home  Chief complaint- Left hip pain   HPI:    Peggy Ramirez  is a 86 y.o. female, with history of Sjogren's, lupus, hypertension, hyperlipidemia, and chronic constipation presents to the ER with a chief complaint of hip pain.  Patient request that son relays history, because her throat is dry.  Son reports that patient had an early morning dental appointment for lower dentures at 8:30 AM.  She requested that daughter called to wake her up at 50, but when she got the call it startled her and she jumped out of bed to get the phone.  Her bed is high, and there is a step next to it, but she missed the step.  Patient fell onto the floor and landed on her left hip and knee.  She got up got ready for her dental appointment and made her breakfast and was dressed by the time son got there to see her.  She did report that her hip was too painful to walk her dog, but otherwise was ambulating with her walker.  At baseline she does not ambulate with a walker, but she did have one at the home to use.  They called the PCP who made an appointment at 145 to see patient.  X-ray done at PCP office showed hip fracture and patient was sent to ER.  Patient reports that hip pain is worse with sitting, better with standing and laying.  It hurts with any movement of the hip.  Patient cannot characterize the pain as sharp, achy, burning, etc.  She only reports that she cannot lift the hip at all, and weightbearing makes the pain worse.  Again patient reports that her fall was mechanical, she did not hit her head, did not have LOC, did not have chest pain, dyspnea, palpitations.  She does report 10 to 20  seconds of nausea when she first fell.  Patient reports that she has been constipated chronically.  Patient's only other concern at this time is that she has not been able to void since she has been in the hospital.  She reports that it is a "shy kidney."  She feels as though her bladder is full, and is painful to palpation.  No other complaints at this time.  In the ED Afebrile, stable vitals No leukocytosis, hemoglobin stable at 13.4, O- blood type CHEM panel is unremarkable X-ray of knee shows chondrocalcinosis without acute osseous abnormality X-ray of left hip shows subcapital fracture of left femoral neck Chest x-ray shows no active disease EKG shows a heart rate of 65, QTc 440, sinus rhythm INR 1.0 Dr. Doreatha Martin consulted from ED and recommends admission to [facility]    Review of systems:    In addition to the HPI above,  No Fever-chills,  No Headache, No changes with Vision or hearing, No problems swallowing food or Liquids, No Chest pain, Cough or Shortness of Breath, No Abdominal pain, no vomiting, bowel movements are regular, No Blood in stool or Urine, No dysuria, admits to urinary retention No new skin rashes or bruises, Left hip pain No new weakness, tingling, numbness in any extremity, No recent weight gain or loss, No polyuria, polydypsia or polyphagia, No significant Mental Stressors.  All other systems reviewed and are negative.    Past History of the following :    Past Medical History:  Diagnosis Date  . Chronic constipation   . Chronic left lower quadrant pain   . Headache(784.0)   . High cholesterol   . Hydrosalpinx    LEFT  . Hypertension   . Lupus ([place])   . 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 Ovarian fibroma    RIGHT  . Pelvic adhesions   . Sjogren's syndrome [facility])       Past Surgical History:  Procedure Laterality Date  . APPENDECTOMY  2002  . LAPAROSCOPIC CHOLECYSTECTOMY  2002  . TONSILLECTOMY  1941  . TOTAL  ABDOMINAL HYSTERECTOMY W/ BILATERAL SALPINGOOPHORECTOMY  2005  . VAGINAL HYSTERECTOMY  1981      Social History:      Social History   Tobacco Use  . Smoking status: Never Smoker  . Smokeless tobacco: Never Used  Substance Use Topics  . Alcohol use: No    Alcohol/week: 0.0 standard drinks       Family History :     Family History  Problem Relation Age of Onset  . Heart disease Other   . Anxiety disorder Other   . Depression Other    Reviewed   Home Medications:   Prior to Admission medications   Medication Sig Start Date End Date Taking? Authorizing Provider  Ascorbic Acid (VITAMIN C) 1000 MG tablet Take 1,000 mg by mouth daily.    [provider]  aspirin EC 81 MG tablet Take 81 mg by mouth daily.    [provider]  Cholecalciferol (VITAMIN D-3 PO) Take 1 tablet by mouth daily.    [provider]  hydrochlorothiazide (MICROZIDE) 12.5 MG capsule TAKE 1 CAPSULE BY MOUTH ONCE DAILY PATIENT  NEEDS  APPOINTMENT  FOR  FURTHER  REFILLS 04/11/19   Arnoldo Lenis, MD  loratadine (CLARITIN) 10 MG tablet Take 10 mg by mouth daily.    [provider]  Multiple Vitamin (MULTIVITAMIN) tablet Take 1 tablet by mouth daily.    [provider]  pantoprazole (PROTONIX) 40 MG tablet Take 40 mg by mouth 2 (two) times daily.     [provider]  polyethylene glycol powder (MIRALAX) powder Take 17 g by mouth daily.      [provider]  potassium chloride (MICRO-K) 10 MEQ CR capsule Take 1 tablet by mouth Daily. 11/14/11   [provider]  zinc gluconate 50 MG tablet Take 50 mg by mouth daily.    [provider]     Allergies:     Allergies  Allergen Reactions  . Atorvastatin Other (See Comments)    Muscle aches  . Cellcept [Mycophenolate Mofetil]   . Levofloxacin     REACTION: rash  . Medrol [Methylprednisolone]   . Mycophenolate Mofetil   . Penicillins     REACTION: itching  . Sulfonamide  Derivatives     REACTION: nausea  . Topiramate   . Verapamil  Felt shakey     Physical Exam:   Vitals  Blood pressure (!) 151/65, pulse 68, temperature 98.3 F (36.8 C), temperature source Oral, resp. rate 18, height 5' (1.524 m), weight 49.9 kg, SpO2 100 %.  1.  General: Laying supine in bed in no acute distress  2. Psychiatric: Alert and oriented x3 Mood and behavior normal for situation  3. Neurologic: Cranial nerves II through XII are grossly intact, no focal deficit on limited exam, at baseline  4. HEENMT:  Head is atraumatic, normocephalic, pupils are reactive to light, neck is supple, trachea is midline, mucous membranes are moist  5. Respiratory : Lungs are clear to auscultation bilaterally  6. Cardiovascular : Heart rate is normal rhythm is regular No rubs or gallops  7. Gastrointestinal:  Abdomen is soft, nondistended, nontender to palpation  8. Skin:  No acute lesions on limited skin exam Bruising over left knee  9.Musculoskeletal:  Bruising over left knee, no rotation of left leg, tenderness with any movement of left hip, tenderness to palpation inguinal region left side    Data Review:    CBC Recent Labs  Lab 12/14/19 1657  WBC 6.5  HGB 13.4  HCT 39.6  PLT 193  MCV 95.9  MCH 32.4  MCHC 33.8  RDW 12.5  LYMPHSABS 0.4*  MONOABS 0.6  EOSABS 0.0  BASOSABS 0.0   ------------------------------------------------------------------------------------------------------------------  Results for orders placed or performed during the hospital encounter of 12/14/19 (from the past 48 hour(s))  Basic metabolic panel     Status: Abnormal   Collection Time: 12/14/19  4:57 PM  Result Value Ref Range   Sodium 133 (L) 135 - 145 mmol/L   Potassium 3.6 3.5 - 5.1 mmol/L   Chloride 97 (L) 98 - 111 mmol/L   CO2 25 22 - 32 mmol/L   Glucose, Bld 113 (H) 70 - 99 mg/dL    Comment: Glucose reference range applies only to samples taken after fasting for at  least 8 hours.   BUN 22 8 - 23 mg/dL   Creatinine, Ser 0.81 0.44 - 1.00 mg/dL   Calcium 10.4 (H) 8.9 - 10.3 mg/dL   GFR calc non Af Amer >60 >60 mL/min   GFR calc Af Amer >60 >60 mL/min   Anion gap 11 5 - 15    Comment: Performed at [facility], [address] [place], [place] [address]  CBC WITH DIFFERENTIAL     Status: Abnormal   Collection Time: 12/14/19  4:57 PM  Result Value Ref Range   WBC 6.5 4.0 - 10.5 K/uL   RBC 4.13 3.87 - 5.11 MIL/uL   Hemoglobin 13.4 12.0 - 15.0 g/dL   HCT 39.6 36 - 46 %   MCV 95.9 80.0 - 100.0 fL   MCH 32.4 26.0 - 34.0 pg   MCHC 33.8 30.0 - 36.0 g/dL   RDW 12.5 11.5 - 15.5 %   Platelets 193 150 - 400 K/uL   nRBC 0.0 0.0 - 0.2 %   Neutrophils Relative % 82 %   Neutro Abs 5.4 1.7 - 7.7 K/uL   Lymphocytes Relative 7 %   Lymphs Abs 0.4 (L) 0.7 - 4.0 K/uL   Monocytes Relative 10 %   Monocytes Absolute 0.6 0 - 1 K/uL   Eosinophils Relative 1 %   Eosinophils Absolute 0.0 0 - 0 K/uL   Basophils Relative 0 %   Basophils Absolute 0.0 0 - 0 K/uL   Immature Granulocytes 0 %   Abs Immature  Granulocytes 0.02 0.00 - 0.07 K/uL    Comment: Performed at [facility], [address], [place], [place] [address]  Protime-INR     Status: None   Collection Time: 12/14/19  4:57 PM  Result Value Ref Range   Prothrombin Time 12.5 11.4 - 15.2 seconds   INR 1.0 0.8 - 1.2    Comment: (NOTE) INR goal varies based on device and disease states. Performed at [facility], [address] [place], [place] [address]   Type and screen [facility]     Status: None   Collection Time: 12/14/19  4:57 PM  Result Value Ref Range   ABO/RH(D) O NEG    Antibody Screen NEG    Sample Expiration      12/17/2019,2359 Performed at [facility], [address] [place], [place] [address]   SARS Coronavirus 2 by RT PCR (hospital order, performed in [facility] hospital lab) Nasopharyngeal Nasopharyngeal Swab     Status: None   Collection Time: 12/14/19  5:01 PM   Specimen:  Nasopharyngeal Swab  Result Value Ref Range   SARS Coronavirus 2 NEGATIVE NEGATIVE    Comment: (NOTE) SARS-CoV-2 target nucleic acids are NOT DETECTED.  The SARS-CoV-2 RNA is generally detectable in upper and lower respiratory specimens during the acute phase of infection. The lowest concentration of SARS-CoV-2 viral copies this assay can detect is 250 copies / mL. A negative result does not preclude SARS-CoV-2 infection and should not be used as the sole basis for treatment or other patient management decisions.  A negative result may occur with improper specimen collection / handling, submission of specimen other than nasopharyngeal swab, presence of viral mutation(s) within the areas targeted by this assay, and inadequate number of viral copies (<250 copies / mL). A negative result must be combined with clinical observations, patient history, and epidemiological information.  Fact Sheet for Patients:   StrictlyIdeas.no  Fact Sheet for Healthcare Providers: BankingDealers.co.za  This test is not yet approved or  cleared by the [place] FDA and has been authorized for detection and/or diagnosis of SARS-CoV-2 by FDA under an Emergency Use Authorization (EUA).  This EUA will remain in effect (meaning this test can be used) for the duration of the COVID-19 declaration under Section 564(b)(1) of the Act, 21 U.S.C. section 360bbb-3(b)(1), unless the authorization is terminated or revoked sooner.  Performed at [facility], [address], [place], [place] [address]   ABO/Rh     Status: None   Collection Time: 12/14/19  7:10 PM  Result Value Ref Range   ABO/RH(D)      O NEG Performed at [facility], [address] [place], [place] [address]     Chemistries  Recent Labs  Lab 12/14/19 1657  NA 133*  K 3.6  CL 97*  CO2 25  GLUCOSE 113*  BUN 22  CREATININE 0.81  CALCIUM 10.4*    ------------------------------------------------------------------------------------------------------------------  ------------------------------------------------------------------------------------------------------------------ GFR: Estimated Creatinine Clearance: 37.1 mL/min (by C-G formula based on SCr of 0.81 mg/dL). Liver Function Tests: No results for input(s): AST, ALT, ALKPHOS, BILITOT, PROT, ALBUMIN in the last 168 hours. No results for input(s): LIPASE, AMYLASE in the last 168 hours. No results for input(s): AMMONIA in the last 168 hours. Coagulation Profile: Recent Labs  Lab 12/14/19 1657  INR 1.0   Cardiac Enzymes: No results for input(s): CKTOTAL, CKMB, CKMBINDEX, TROPONINI in the last 168 hours. BNP (last 3 results) No results for input(s): PROBNP in the last 8760 hours. HbA1C: No  results for input(s): HGBA1C in the last 72 hours. CBG: No results for input(s): GLUCAP in the last 168 hours. Lipid Profile: No results for input(s): CHOL, HDL, LDLCALC, TRIG, CHOLHDL, LDLDIRECT in the last 72 hours. Thyroid Function Tests: No results for input(s): TSH, T4TOTAL, FREET4, T3FREE, THYROIDAB in the last 72 hours. Anemia Panel: No results for input(s): VITAMINB12, FOLATE, FERRITIN, TIBC, IRON, RETICCTPCT in the last 72 hours.  --------------------------------------------------------------------------------------------------------------- Urine analysis:    Component Value Date/Time   COLORURINE YELLOW 10/15/2015 1245   APPEARANCEUR CLEAR 10/15/2015 1245   LABSPEC 1.010 10/15/2015 1245   PHURINE 5.5 10/15/2015 1245   GLUCOSEU NEGATIVE 10/15/2015 1245   HGBUR NEGATIVE 10/15/2015 1245   [place] 10/15/2015 1245   KETONESUR NEGATIVE 10/15/2015 1245   PROTEINUR NEGATIVE 10/15/2015 1245   NITRITE NEGATIVE 10/15/2015 1245   LEUKOCYTESUR NEGATIVE 10/15/2015 1245      Imaging Results:    DG Chest 1 View  Result Date: 12/14/2019 CLINICAL DATA:   86 year old female with fall and hip fracture. EXAM: CHEST  1 VIEW COMPARISON:  None. FINDINGS: No focal consolidation, pleural effusion or pneumothorax. The cardiac silhouette is within limits. Atherosclerotic calcification of the aorta. No acute osseous pathology. IMPRESSION: No active disease. Electronically Signed   By: Anner Crete M.D.   On: 12/14/2019 18:27   DG Elbow Complete Left  Result Date: 12/14/2019 CLINICAL DATA:  Status post trauma. EXAM: LEFT ELBOW - COMPLETE 3+ VIEW COMPARISON:  None. FINDINGS: There is no evidence of fracture, dislocation, or joint effusion. There is no evidence of arthropathy or other focal bone abnormality. Soft tissues are unremarkable. IMPRESSION: Negative. Electronically Signed   By: Virgina Norfolk M.D.   On: 12/14/2019 18:35   DG Knee 2 Views Left  Result Date: 12/14/2019 CLINICAL DATA:  Status post fall. EXAM: LEFT KNEE - 1-2 VIEW COMPARISON:  None. FINDINGS: No evidence of fracture, dislocation, or joint effusion. Mild medial and lateral tibiofemoral compartment space narrowing is seen with mild medial and lateral chondrocalcinosis. Soft tissues are unremarkable. IMPRESSION: Mild degenerative changes and chondrocalcinosis without evidence of acute osseous abnormality. Electronically Signed   By: Virgina Norfolk M.D.   On: 12/14/2019 18:34   DG Hip Unilat With Pelvis 2-3 Views Left  Result Date: 12/14/2019 CLINICAL DATA:  LEFT hip pain post fall out of bed this morning EXAM: DG HIP (WITH OR WITHOUT PELVIS) 2-3V LEFT COMPARISON:  None FINDINGS: Osseous demineralization. Hip and SI joint spaces preserved. Impacted subcapital fracture of the LEFT femoral neck. No additional fracture, dislocation, or bone destruction. Degenerative disc disease changes at visualized lower lumbar spine. IMPRESSION: Impacted subcapital fracture of the LEFT femoral neck. Osseous demineralization. Electronically Signed   By: Lavonia Dana M.D.   On: 12/14/2019 17:43    My  personal review of EKG: Rhythm NSR, Rate 65 /min, QTc 440 ,no Acute ST changes   Assessment & Plan:    Active Problems:   Closed left hip fracture ([place])   1. Left subcapital hip fracture 1. X-ray shows impacted subcapital fracture of left femoral neck, osseous demineralization 2. Ortho consulted from ER advises admission to [facility] and likely surgery in a.m. 3. INR 1.0, O- blood type 4. UA pending, chest x-ray without active disease 5. EKG shows heart rate 65, QTc 440, sinus rhythm 6. 3.9% 30-day risk of death, MI, or cardiac arrest by the revised cardiac risk index for preoperative risk 7. Pain control with pain scale 8. N.p.o. after midnight 9. Holding aspirin prior to  surgery 10. Continue to monitor 2. Fall 1. Mechanical, No LOC, Did not hit head 2. No preceding factors 3. Will req PT when rec'd by ortho 3. Urinary retention 1. UA pending 2. Foley catheter inserted 3. Monitor intake and output 4. Chronic constipation 1. Continue MiraLAX 2. Monitor intake and output 5. Hypertension 1. Continue home medications 6. Hyperlipidemia 1. Continue home medications   DVT Prophylaxis-   Heparin- SCDs   AM Labs Ordered, also please review Full Orders  Family Communication: Admission, patients condition and plan of care including tests being ordered have been discussed with the patient and Son, [name], who indicate understanding and agree with the plan and Code Status.  Code Status:  Full  Admission status:  Inpatient :The appropriate admission status for this patient is INPATIENT. Inpatient status is judged to be reasonable and necessary in order to provide the required intensity of service to ensure the patient's safety. The patient's presenting symptoms, physical exam findings, and initial radiographic and laboratory data in the context of their chronic comorbidities is felt to place them at high risk for further clinical deterioration. Furthermore, it is not anticipated that  the patient will be medically stable for discharge from the hospital within 2 midnights of admission. The following factors support the admission status of inpatient.     The patient's presenting symptoms include Fall and hip pain. The worrisome physical exam findings include Pain with movement The initial radiographic and laboratory data are worrisome because of Subcap left hip fx The chronic co-morbidities include Sjogrens, Lupus, HTN, HLD, Chronic constipation       * I certify that at the point of admission it is my clinical judgment that the patient will require inpatient hospital care spanning beyond 2 midnights from the point of admission due to high intensity of service, high risk for further deterioration and high frequency of surveillance required.*  Time spent in minutes : [place]

## 2019-12-14 NOTE — ED Triage Notes (Signed)
Pt fell out of bed this morning. Pain to LT hip.  Had xray at pcp that is + for LT hip FX .

## 2019-12-15 ENCOUNTER — Inpatient Hospital Stay (HOSPITAL_COMMUNITY): Payer: Medicare Other

## 2019-12-15 ENCOUNTER — Encounter (HOSPITAL_COMMUNITY)
Admission: EM | Disposition: A | Discharge status: Home Health | Payer: Self-pay | Source: Home / Self Care | Attending: Internal Medicine

## 2019-12-15 ENCOUNTER — Encounter (HOSPITAL_COMMUNITY): Payer: Self-pay | Admitting: Family Medicine

## 2019-12-15 ENCOUNTER — Inpatient Hospital Stay (HOSPITAL_COMMUNITY): Payer: Medicare Other | Admitting: Anesthesiology

## 2019-12-15 DIAGNOSIS — E876 Hypokalemia: Secondary | ICD-10-CM

## 2019-12-15 DIAGNOSIS — K5909 Other constipation: Secondary | ICD-10-CM

## 2019-12-15 DIAGNOSIS — R338 Other retention of urine: Secondary | ICD-10-CM | POA: Diagnosis present

## 2019-12-15 DIAGNOSIS — I10 Essential (primary) hypertension: Secondary | ICD-10-CM

## 2019-12-15 HISTORY — PX: HIP PINNING,CANNULATED: SHX1758

## 2019-12-15 LAB — MRSA PCR SCREENING: MRSA by PCR: NEGATIVE

## 2019-12-15 LAB — COMPREHENSIVE METABOLIC PANEL
ALT: 20 U/L (ref 0–44)
AST: 22 U/L (ref 15–41)
Albumin: 3.1 g/dL — ABNORMAL LOW (ref 3.5–5.0)
Alkaline Phosphatase: 49 U/L (ref 38–126)
Anion gap: 8 (ref 5–15)
BUN: 12 mg/dL (ref 8–23)
CO2: 25 mmol/L (ref 22–32)
Calcium: 9.8 mg/dL (ref 8.9–10.3)
Chloride: 103 mmol/L (ref 98–111)
Creatinine, Ser: 0.69 mg/dL (ref 0.44–1.00)
GFR calc Af Amer: >60 mL/min (ref >60)
GFR calc non Af Amer: >60 mL/min (ref >60)
Glucose, Bld: 98 mg/dL (ref 70–99)
Potassium: 3 mmol/L — ABNORMAL LOW (ref 3.5–5.1)
Sodium: 136 mmol/L (ref 135–145)
Total Bilirubin: 1 mg/dL (ref 0.3–1.2)
Total Protein: 5.9 g/dL — ABNORMAL LOW (ref 6.5–8.1)

## 2019-12-15 LAB — CBC
HCT: 38.3 % (ref 36.0–46.0)
Hemoglobin: 13.2 g/dL (ref 12.0–15.0)
MCH: 32.4 pg (ref 26.0–34.0)
MCHC: 34.5 g/dL (ref 30.0–36.0)
MCV: 93.9 fL (ref 80.0–100.0)
Platelets: 174 10*3/uL (ref 150–400)
RBC: 4.08 MIL/uL (ref 3.87–5.11)
RDW: 12.4 % (ref 11.5–15.5)
WBC: 4.8 10*3/uL (ref 4.0–10.5)
nRBC: 0 % (ref 0.0–0.2)

## 2019-12-15 LAB — VITAMIN D 25 HYDROXY (VIT D DEFICIENCY, FRACTURES): Vit D, 25-Hydroxy: 118.26 ng/mL — ABNORMAL HIGH (ref 30–100)

## 2019-12-15 LAB — PROTIME-INR
INR: 1 (ref 0.8–1.2)
Prothrombin Time: 13.1 seconds (ref 11.4–15.2)

## 2019-12-15 LAB — MAGNESIUM: Magnesium: 2.1 mg/dL (ref 1.7–2.4)

## 2019-12-15 SURGERY — FIXATION, FEMUR, NECK, PERCUTANEOUS, USING SCREW
Anesthesia: General | Site: Hip | Laterality: Left

## 2019-12-15 MED ORDER — LORATADINE 10 MG PO TABS
10.0000 mg | ORAL_TABLET | Freq: Every day | ORAL | Status: DC
  Administered 2019-12-15 – 2019-12-17 (×3): 10 mg via ORAL
  Filled 2019-12-15 (×3): qty 1

## 2019-12-15 MED ORDER — HYDRALAZINE HCL 20 MG/ML IJ SOLN: 10.0000 mg | Freq: Three times a day (TID) | INTRAMUSCULAR | Status: DC | PRN

## 2019-12-15 MED ORDER — PROPOFOL 10 MG/ML IV BOLUS
INTRAVENOUS | Status: DC | PRN
  Administered 2019-12-15: 100 mg via INTRAVENOUS

## 2019-12-15 MED ORDER — FENTANYL CITRATE (PF) 100 MCG/2ML IJ SOLN
INTRAMUSCULAR | Status: AC
  Filled 2019-12-15: qty 2

## 2019-12-15 MED ORDER — ACETAMINOPHEN 650 MG RE SUPP: 650.0000 mg | Freq: Four times a day (QID) | RECTAL | Status: DC | PRN

## 2019-12-15 MED ORDER — POTASSIUM CHLORIDE 20 MEQ PO PACK
40.0000 meq | PACK | Freq: Once | ORAL | Status: AC
  Administered 2019-12-15: 40 meq via ORAL
  Filled 2019-12-15: qty 2

## 2019-12-15 MED ORDER — BISACODYL 10 MG RE SUPP: 10.0000 mg | Freq: Every day | RECTAL | Status: DC | PRN

## 2019-12-15 MED ORDER — FENTANYL CITRATE (PF) 100 MCG/2ML IJ SOLN
INTRAMUSCULAR | Status: DC | PRN
Start: 2019-12-15 — End: 2019-12-15
  Administered 2019-12-15 (×2): 25 ug via INTRAVENOUS

## 2019-12-15 MED ORDER — MORPHINE SULFATE (PF) 2 MG/ML IV SOLN: 2.0000 mg | INTRAVENOUS | Status: DC | PRN

## 2019-12-15 MED ORDER — SENNOSIDES-DOCUSATE SODIUM 8.6-50 MG PO TABS: 1.0000 | ORAL_TABLET | Freq: Every evening | ORAL | Status: DC | PRN

## 2019-12-15 MED ORDER — HEPARIN SODIUM (PORCINE) 5000 UNIT/ML IJ SOLN: 5000.0000 [IU] | Freq: Three times a day (TID) | INTRAMUSCULAR | Status: DC

## 2019-12-15 MED ORDER — ONDANSETRON HCL 4 MG/2ML IJ SOLN: 4.0000 mg | Freq: Once | INTRAMUSCULAR | Status: DC | PRN

## 2019-12-15 MED ORDER — 0.9 % SODIUM CHLORIDE (POUR BTL) OPTIME
TOPICAL | Status: DC | PRN
  Administered 2019-12-15: 1000 mL

## 2019-12-15 MED ORDER — LIDOCAINE 2% (20 MG/ML) 5 ML SYRINGE
INTRAMUSCULAR | Status: AC
  Filled 2019-12-15: qty 5

## 2019-12-15 MED ORDER — OXYCODONE HCL 5 MG PO TABS: 5.0000 mg | ORAL_TABLET | Freq: Once | ORAL | Status: DC | PRN

## 2019-12-15 MED ORDER — PHENYLEPHRINE HCL-NACL 10-0.9 MG/250ML-% IV SOLN
INTRAVENOUS | Status: AC
  Filled 2019-12-15: qty 500

## 2019-12-15 MED ORDER — PANTOPRAZOLE SODIUM 40 MG PO TBEC
40.0000 mg | DELAYED_RELEASE_TABLET | Freq: Two times a day (BID) | ORAL | Status: DC
  Administered 2019-12-15 – 2019-12-17 (×4): 40 mg via ORAL
  Filled 2019-12-15 (×4): qty 1

## 2019-12-15 MED ORDER — ONDANSETRON HCL 4 MG/2ML IJ SOLN
INTRAMUSCULAR | Status: AC
  Filled 2019-12-15: qty 2

## 2019-12-15 MED ORDER — ACETAMINOPHEN 325 MG PO TABS
650.0000 mg | ORAL_TABLET | Freq: Four times a day (QID) | ORAL | Status: DC | PRN
  Administered 2019-12-15: 650 mg via ORAL
  Filled 2019-12-15: qty 2

## 2019-12-15 MED ORDER — FENTANYL CITRATE (PF) 100 MCG/2ML IJ SOLN
25.0000 ug | INTRAMUSCULAR | Status: DC | PRN
  Administered 2019-12-15 (×2): 25 ug via INTRAVENOUS

## 2019-12-15 MED ORDER — HYDROCHLOROTHIAZIDE 12.5 MG PO CAPS
12.5000 mg | ORAL_CAPSULE | Freq: Every day | ORAL | Status: DC
  Administered 2019-12-16 – 2019-12-17 (×2): 12.5 mg via ORAL
  Filled 2019-12-15 (×2): qty 1

## 2019-12-15 MED ORDER — ONDANSETRON HCL 4 MG/2ML IJ SOLN
INTRAMUSCULAR | Status: DC | PRN
  Administered 2019-12-15: 4 mg via INTRAVENOUS

## 2019-12-15 MED ORDER — DEXAMETHASONE SODIUM PHOSPHATE 10 MG/ML IJ SOLN
INTRAMUSCULAR | Status: DC | PRN
  Administered 2019-12-15: 10 mg via INTRAVENOUS

## 2019-12-15 MED ORDER — LACTATED RINGERS IV SOLN: INTRAVENOUS | Status: DC

## 2019-12-15 MED ORDER — SENNOSIDES-DOCUSATE SODIUM 8.6-50 MG PO TABS
2.0000 | ORAL_TABLET | Freq: Two times a day (BID) | ORAL | Status: DC
  Administered 2019-12-15 – 2019-12-17 (×4): 2 via ORAL
  Filled 2019-12-15 (×4): qty 2

## 2019-12-15 MED ORDER — ONDANSETRON HCL 4 MG/2ML IJ SOLN: 4.0000 mg | Freq: Four times a day (QID) | INTRAMUSCULAR | Status: DC | PRN

## 2019-12-15 MED ORDER — CEFAZOLIN SODIUM-DEXTROSE 2-4 GM/100ML-% IV SOLN
2.0000 g | Freq: Three times a day (TID) | INTRAVENOUS | Status: AC
  Administered 2019-12-15 – 2019-12-16 (×3): 2 g via INTRAVENOUS
  Filled 2019-12-15 (×3): qty 100

## 2019-12-15 MED ORDER — OXYCODONE HCL 5 MG/5ML PO SOLN: 5.0000 mg | Freq: Once | ORAL | Status: DC | PRN

## 2019-12-15 MED ORDER — CEFAZOLIN SODIUM-DEXTROSE 2-3 GM-%(50ML) IV SOLR
INTRAVENOUS | Status: DC | PRN
  Administered 2019-12-15: 2 g via INTRAVENOUS

## 2019-12-15 MED ORDER — ONDANSETRON HCL 4 MG PO TABS: 4.0000 mg | ORAL_TABLET | Freq: Four times a day (QID) | ORAL | Status: DC | PRN

## 2019-12-15 MED ORDER — CHLORHEXIDINE GLUCONATE 0.12 % MT SOLN
15.0000 mL | Freq: Once | OROMUCOSAL | Status: AC
  Administered 2019-12-15: 15 mL via OROMUCOSAL
  Filled 2019-12-15: qty 15

## 2019-12-15 MED ORDER — LIDOCAINE 2% (20 MG/ML) 5 ML SYRINGE
INTRAMUSCULAR | Status: DC | PRN
  Administered 2019-12-15: 60 mg via INTRAVENOUS

## 2019-12-15 MED ORDER — ENOXAPARIN SODIUM 40 MG/0.4ML ~~LOC~~ SOLN
40.0000 mg | SUBCUTANEOUS | Status: DC
  Administered 2019-12-16 – 2019-12-17 (×2): 40 mg via SUBCUTANEOUS
  Filled 2019-12-15 (×2): qty 0.4

## 2019-12-15 MED ORDER — POLYETHYLENE GLYCOL 3350 17 G PO PACK
17.0000 g | PACK | Freq: Every day | ORAL | Status: DC
  Administered 2019-12-15 – 2019-12-17 (×3): 17 g via ORAL
  Filled 2019-12-15 (×3): qty 1

## 2019-12-15 MED ORDER — FENTANYL CITRATE (PF) 250 MCG/5ML IJ SOLN
INTRAMUSCULAR | Status: AC
  Filled 2019-12-15: qty 5

## 2019-12-15 MED ORDER — PROPOFOL 10 MG/ML IV BOLUS
INTRAVENOUS | Status: AC
  Filled 2019-12-15: qty 20

## 2019-12-15 MED ORDER — CEFAZOLIN SODIUM-DEXTROSE 2-4 GM/100ML-% IV SOLN
INTRAVENOUS | Status: AC
  Filled 2019-12-15: qty 100

## 2019-12-15 MED ORDER — OXYCODONE HCL 5 MG PO TABS
5.0000 mg | ORAL_TABLET | ORAL | Status: DC | PRN
  Administered 2019-12-15 – 2019-12-16 (×2): 5 mg via ORAL
  Filled 2019-12-15 (×2): qty 1

## 2019-12-15 SURGICAL SUPPLY — 49 items
BNDG COHESIVE 6X5 TAN STRL LF (GAUZE/BANDAGES/DRESSINGS) ×3 IMPLANT
BRUSH SCRUB EZ PLAIN DRY (MISCELLANEOUS) ×3 IMPLANT
CHLORAPREP W/TINT 26 (MISCELLANEOUS) ×3 IMPLANT
COVER SURGICAL LIGHT HANDLE (MISCELLANEOUS) ×6 IMPLANT
COVER WAND RF STERILE (DRAPES) ×3 IMPLANT
DERMABOND ADVANCED (GAUZE/BANDAGES/DRESSINGS) ×2
DERMABOND ADVANCED .7 DNX12 (GAUZE/BANDAGES/DRESSINGS) ×1 IMPLANT
DRAPE C-ARMOR (DRAPES) ×3 IMPLANT
DRAPE HALF SHEET 40X57 (DRAPES) ×6 IMPLANT
DRAPE IMP U-DRAPE 54X76 (DRAPES) ×6 IMPLANT
DRAPE ORTHO SPLIT 77X108 STRL (DRAPES) ×6
DRAPE STERI IOBAN 125X83 (DRAPES) ×3 IMPLANT
DRAPE SURG 17X23 STRL (DRAPES) ×3 IMPLANT
DRAPE SURG ORHT 6 SPLT 77X108 (DRAPES) ×2 IMPLANT
DRAPE U-SHAPE 47X51 STRL (DRAPES) ×3 IMPLANT
DRSG MEPILEX BORDER 4X4 (GAUZE/BANDAGES/DRESSINGS) ×3 IMPLANT
ELECT REM PT RETURN 9FT ADLT (ELECTROSURGICAL) ×3
ELECTRODE REM PT RTRN 9FT ADLT (ELECTROSURGICAL) ×1 IMPLANT
GLOVE BIO SURGEON STRL SZ 6.5 (GLOVE) ×6 IMPLANT
GLOVE BIO SURGEON STRL SZ7.5 (GLOVE) ×12 IMPLANT
GLOVE BIO SURGEONS STRL SZ 6.5 (GLOVE) ×3
GLOVE BIOGEL PI IND STRL 6.5 (GLOVE) ×1 IMPLANT
GLOVE BIOGEL PI IND STRL 7.0 (GLOVE) ×5 IMPLANT
GLOVE BIOGEL PI IND STRL 7.5 (GLOVE) ×1 IMPLANT
GLOVE BIOGEL PI INDICATOR 6.5 (GLOVE) ×2
GLOVE BIOGEL PI INDICATOR 7.0 (GLOVE) ×10
GLOVE BIOGEL PI INDICATOR 7.5 (GLOVE) ×2
GOWN STRL REUS W/ TWL LRG LVL3 (GOWN DISPOSABLE) ×2 IMPLANT
GOWN STRL REUS W/TWL LRG LVL3 (GOWN DISPOSABLE) ×6
KIT BASIN OR (CUSTOM PROCEDURE TRAY) ×3 IMPLANT
KIT TURNOVER KIT B (KITS) ×3 IMPLANT
MANIFOLD NEPTUNE II (INSTRUMENTS) ×3 IMPLANT
NS IRRIG 1000ML POUR BTL (IV SOLUTION) ×3 IMPLANT
PACK GENERAL/GYN (CUSTOM PROCEDURE TRAY) ×3 IMPLANT
PAD ARMBOARD 7.5X6 YLW CONV (MISCELLANEOUS) ×6 IMPLANT
PIN GUIDE DRILL TIP 2.8X300 (DRILL) ×9 IMPLANT
SCREW CANN  FULL THD 6.5X75 (Screw) ×6 IMPLANT
SCREW CANN FULL THD 6.5X75 (Screw) ×2 IMPLANT
SCREW FULLY THREADED 6.5X80 (Screw) ×3 IMPLANT
STAPLER VISISTAT 35W (STAPLE) ×3 IMPLANT
STOCKINETTE IMPERVIOUS LG (DRAPES) ×3 IMPLANT
SUT MNCRL AB 3-0 PS2 18 (SUTURE) ×3 IMPLANT
SUT VIC AB 2-0 CT1 27 (SUTURE) ×3
SUT VIC AB 2-0 CT1 TAPERPNT 27 (SUTURE) ×1 IMPLANT
TOWEL GREEN STERILE (TOWEL DISPOSABLE) ×6 IMPLANT
TOWEL GREEN STERILE FF (TOWEL DISPOSABLE) ×3 IMPLANT
WASHER 8.0 (Orthopedic Implant) ×2 IMPLANT
WASHER CANN FLAT 8 (Orthopedic Implant) ×1 IMPLANT
WATER STERILE IRR 1000ML POUR (IV SOLUTION) ×3 IMPLANT

## 2019-12-15 NOTE — Plan of Care (Signed)

## 2019-12-15 NOTE — Anesthesia Postprocedure Evaluation (Signed)
Anesthesia Post Note  Patient: Peggy Ramirez  Procedure(s) Performed: CANNULATED HIP PINNING (Left Hip)     Patient location during evaluation: PACU Anesthesia Type: General Level of consciousness: awake and alert Pain management: pain level controlled Vital Signs Assessment: post-procedure vital signs reviewed and stable Respiratory status: spontaneous breathing, nonlabored ventilation and respiratory function stable Cardiovascular status: blood pressure returned to baseline and stable Postop Assessment: no apparent nausea or vomiting Anesthetic complications: no   No complications documented.  Last Vitals:  Vitals:   12/15/19 1214 12/15/19 1251  BP: (!) 141/62 127/68  Pulse: 67 68  Resp: 19 17  Temp:  36.8 C  SpO2: 94% 93%    Last Pain:  Vitals:   12/15/19 1251  TempSrc: Oral  PainSc:                  [name]

## 2019-12-15 NOTE — Transfer of Care (Signed)
Immediate Anesthesia Transfer of Care Note  Patient: Peggy Ramirez  Procedure(s) Performed: CANNULATED HIP PINNING (Left Hip)  Patient Location: PACU  Anesthesia Type:General  Level of Consciousness: drowsy  Airway & Oxygen Therapy: Patient Spontanous Breathing and Patient connected to nasal cannula oxygen  Post-op Assessment: Report given to RN, Post -op Vital signs reviewed and stable and Patient moving all extremities  Post vital signs: Reviewed and stable  Last Vitals:  Vitals Value Taken Time  BP 148/65 12/15/19 1112  Temp 36.6 C 12/15/19 1111  Pulse 61 12/15/19 1114  Resp 16 12/15/19 1114  SpO2 100 % 12/15/19 1114  Vitals shown include unvalidated device data.  Last Pain:  Vitals:   12/15/19 0801  TempSrc: Oral  PainSc:          Complications: No complications documented.

## 2019-12-15 NOTE — Plan of Care (Signed)

## 2019-12-15 NOTE — Anesthesia Preprocedure Evaluation (Addendum)
Anesthesia Evaluation  Patient identified by MRN, date of birth, ID band Patient awake    Reviewed: Allergy & Precautions, NPO status , Patient's Chart, lab work & pertinent test results  History of Anesthesia Complications Negative for: history of anesthetic complications  Airway Mallampati: II  TM Distance: >3 FB Neck ROM: Full    Dental  (+) Dental Advisory Given, Edentulous Lower   Pulmonary neg pulmonary ROS,    Pulmonary exam normal        Cardiovascular hypertension, Pt. on medications Normal cardiovascular exam   '17 TTE - focal basal LVH. EF 55% to 60%. Grade 1 diastolic dysfunction. Mild MR    Neuro/Psych  Headaches, CVA, No Residual Symptoms negative psych ROS   GI/Hepatic Neg liver ROS, GERD  Medicated and Controlled,  Endo/Other   Hypokalemia   Renal/GU Renal disease     Musculoskeletal negative musculoskeletal ROS (+)   Abdominal   Peds  Hematology negative hematology ROS (+)   Anesthesia Other Findings Sjogren's syndrome Lupus Covid test negative   Reproductive/Obstetrics  Ovarian fibroma Hydrosalpinx Chronic LLQ pain                             Anesthesia Physical Anesthesia Plan  ASA: III  Anesthesia Plan: General   Post-op Pain Management:    Induction: Intravenous  PONV Risk Score and Plan: 4 or greater and Treatment may vary due to age or medical condition, Ondansetron and Propofol infusion  Airway Management Planned: LMA  Additional Equipment: None  Intra-op Plan:   Post-operative Plan: Extubation in OR  Informed Consent: I have reviewed the patients History and Physical, chart, labs and discussed the procedure including the risks, benefits and alternatives for the proposed anesthesia with the patient or authorized representative who has indicated his/her understanding and acceptance.     Dental advisory given  Plan Discussed with: CRNA and  Anesthesiologist  Anesthesia Plan Comments:        Anesthesia Quick Evaluation

## 2019-12-15 NOTE — Op Note (Signed)
Orthopaedic Surgery Operative Note (CSN: [id number] ) Date of Surgery: 12/15/2019  Admit Date: 12/14/2019   Diagnoses: Pre-Op Diagnoses: Left impacted femoral neck fracture   Post-Op Diagnosis: Same  Procedures: CPT 27235-Percutaneous fixation of left femoral neck fracture  Surgeons : Primary: Shona Needles, MD  Assistant: None  Location: OR 19   Anesthesia:General  Antibiotics: Ancef 2g preop   Tourniquet time:None  Estimated Blood HRCB:63 mL  Complications:None   Specimens:None   Implants: Implant Name Type Inv. Item Serial No. Manufacturer Lot No. LRB No. Used Action  SCREW FULLY THREADED 6.5X80 - [id number] Screw SCREW FULLY THREADED 6.5X80  [employer](ORTH,TRAU,BIO,SG)  Left 1 Implanted  WASHER 8.0 - [id number] Orthopedic Implant WASHER 8.0  [employer](ORTH,TRAU,BIO,SG)  Left 1 Implanted  SCREW CANN  FULL [place] 6.5X75 - [id number] Screw SCREW CANN  FULL THD 6.5X75  [employer](ORTH,TRAU,BIO,SG)  Left 2 Implanted     Indications for Surgery: 86 year old female who sustained a ground-level fall.  Had a valgus impacted left femoral neck fracture.  Due to her injury I recommended proceeding with percutaneous fixation of her left femoral neck.  Risks and benefits were discussed with the patient.  Risks included but not limited to bleeding, infection, malunion, nonunion, hardware failure, need for revision arthroplasty, nerve and blood vessel injury, DVT, even the possibility anesthetic complications the patient agreed to proceed with surgery and consent was obtained.  Operative Findings: Percutaneous fixation of left femoral neck fracture using [employer] 6.5 mm titanium screws  Procedure: The patient was identified in the preoperative holding area. Consent was confirmed with the patient and their family and all questions were answered. The operative extremity was marked after confirmation with the patient they were then brought back to the operating room by our  anesthesia colleagues. They were placed under general anesthesia and carefully transferred to a radiolucent flat top table. A bump was placed under the operative hip and fluoroscopy was used to confirm that the fracture had not displaced. The operative extremity was then prepped and draped in usual sterile fashion. A preoperative timeout was performed to verify the patient, the procedure, and the extremity. Preoperative antibiotics were dosed.  Using fluoroscopy as a guide I marked out an incision. I carried this down through skin and the IT band. I used 2.5mm guidepins to direct up the femoral neck in an inverted triangle. I placed anterior superior guidepin, a posterior superior guidepin and a inferior guidepin. I confirmed positioning with fluoroscopy and then proceeded to measure and placed 6.91mm screws across the femoral neck. I placed the inferior one with a washer to provide better fixation.. The superior screws were placed and obtained excellent fixation.  Final fluoroscopic images were obtained confirming length on all screws. An approach withdrawal technique was used to make sure all screws were extra-articular. The incision was irrigated and closed with 2-0 vicryl, 3-0 monocryl and dermabond. A dressing was placed. The patient was awoken from anesthesia and taken to the PACU in stable condition.    Post Op Plan/Instructions: Patient will be weightbearing as tolerated to left lower extremity.  She will receive postoperative Ancef.  She will receive Lovenox for DVT prophylaxis while inpatient and be discharged home on aspirin 325 mg twice daily.  We will have her mobilize with physical and Occupational Therapy.  I was present and performed the entire surgery.  Katha Hamming, MD [facility]

## 2019-12-15 NOTE — Anesthesia Procedure Notes (Signed)
Procedure Name: LMA Insertion Date/Time: 12/15/2019 10:08 AM Performed by: Zyion Doxtater T, CRNA Pre-anesthesia Checklist: Patient identified, Emergency Drugs available, Suction available and Patient being monitored Patient Re-evaluated:Patient Re-evaluated prior to induction Oxygen Delivery Method: Circle system utilized Preoxygenation: Pre-oxygenation with 100% oxygen Induction Type: IV induction Ventilation: Mask ventilation without difficulty LMA: LMA inserted LMA Size: 4.0 Number of attempts: 1 Placement Confirmation: positive ETCO2 and breath sounds checked- equal and bilateral Tube secured with: Tape Dental Injury: Teeth and Oropharynx as per pre-operative assessment

## 2019-12-15 NOTE — Progress Notes (Signed)
[facility]  PROGRESS NOTE  Peggy Ramirez RKY:[id number] DOB: 02-29-1934 DOA: 12/14/2019 PCP: Manon Hilding, MD Admit date - 12/14/2019   Admitting Physician Rolla Plate, DO  Outpatient Primary MD for the patient is Sasser, Silvestre Moment, MD  LOS - 1 Brief Narrative   Peggy Ramirez is a 86 y.o. year old female with medical history significant for Sjogren's, lupus, hypertension, hyperlipidemia, and chronic constipation who presented on 12/14/2019 withPersistent left hip pain after missing a step while getting out of bed about LOC or head trauma and was found to have subcapital fracture left femoral neck on x-ray.  Patient percutaneous fixation of left femoral neck fracture on 8/19 by Dr. Doreatha Martin.   Subjective  Doing well after procedure. Son at bedside A & P   Left impacted femoral neck fracture after fall, status post percutaneous fixation of left femoral neck fracture on 8/19 by Dr. Devota Pace as tolerated to left lower extremity per Ortho -To receive postoperative Ancef -Lovenox for DVT prophylaxis while inpatient -On discharge 325 mg twice daily -PT/OT -Pain control with as needed IV morphine for severe pain every 2 hours as needed, OxyIR 5 mg every 4 hours as needed moderate pain, Tylenol as needed every 6 hours mild pain  Mechanical fall.  No preceding symptoms, no head trauma. -PT/OT  Reported urinary retention.  UA unremarkable.  Foley catheter inserted prior to the OR -Voiding trial, remove Foley -Monitor intake/output  Chronic constipation -Monitor BM -Continue home MiraLAX daily, add senna docusate BID   HTN, at goal -Continue home HCTZ 12.5 mg daily-monitor BMP  HLD -Has not tolerated atorvastatin in the past (muscle aches)  Prior CVA (09/2015).  No focal deficits on exam -Held home aspirin prior to the OR on admission -On discharge for DVT prophylaxis "out on aspirin 325 mg twice daily  GERD, stable -Continue home Protonix  Hypokalemia,  chronic. In setting of HCTZ -correct potassium -daily mg and bmp, replete K as needed   Family Communication  :  Son [name] updated at bedside  Code Status : Full code, discussed on day of admission  Disposition Plan  :  Patient is from home. Anticipated d/c date: 2 to 3 days. Barriers to d/c or necessity for inpatient status:  Consults  : Orthopedics  Procedures  :  Femoral neck fracture repair by Dr. Doreatha Martin on 8/19  DVT Prophylaxis  :  Lovenox   Lab Results  Component Value Date   PLT 174 12/15/2019    Diet :  Diet Order            Diet Heart Room service appropriate? Yes; Fluid consistency: Thin  Diet effective now                  Inpatient Medications Scheduled Meds: . [START ON 12/16/2019] enoxaparin (LOVENOX) injection  40 mg Subcutaneous Q24H  . hydrochlorothiazide  12.5 mg Oral Daily  . loratadine  10 mg Oral Daily  . pantoprazole  40 mg Oral BID  . polyethylene glycol  17 g Oral Daily  . potassium chloride  40 mEq Oral Once   Continuous Infusions: . sodium chloride 125 mL/hr at 12/15/19 0042  .  ceFAZolin (ANCEF) IV     PRN Meds:.acetaminophen **OR** acetaminophen, bisacodyl, fentaNYL (SUBLIMAZE) injection, hydrALAZINE, melatonin, morphine injection, ondansetron **OR** ondansetron (ZOFRAN) IV, oxyCODONE, senna-docusate  Antibiotics  :   Anti-infectives (From admission, onward)   Start     Dose/Rate Route Frequency Ordered Stop   12/15/19 1400  ceFAZolin (ANCEF) IVPB 2g/100 mL premix        2 g 200 mL/hr over 30 Minutes Intravenous Every 8 hours 12/15/19 1219 12/16/19 1359   12/15/19 0918  ceFAZolin (ANCEF) 2-4 GM/100ML-% IVPB       Note to Pharmacy: Grace Blight   : cabinet override      12/15/19 0918 12/15/19 0930       Objective   Vitals:   12/15/19 1130 12/15/19 1145 12/15/19 1200 12/15/19 1214  BP: 138/70 (!) 142/63 (!) 141/61 (!) 141/62  Pulse: 65 (!) 59 67 67  Resp: 15 16 17 19   Temp:      TempSrc:      SpO2: 100% 100% 99% 94%   Weight:      Height:        SpO2: 94 % O2 Flow Rate (L/min): 3 L/min  Wt Readings from Last 3 Encounters:  12/15/19 49.9 kg  10/11/19 57.7 kg  01/20/18 62 kg     Intake/Output Summary (Last 24 hours) at 12/15/2019 1237 Last data filed at 12/15/2019 1200 Gross per 24 hour  Intake 1501.86 ml  Output 2025 ml  Net -523.14 ml    Physical Exam:     Awake Alert, Oriented X 3, Normal affect No new F.N deficits,  [place].AT, Normal respiratory effort on room air, CTAB RRR,No Gallops,Rubs or new Murmurs,  +ve B.Sounds, Abd Soft, No tenderness, No rebound, guarding or rigidity. No Cyanosis, No new Rash or bruise     I have personally reviewed the following:   Data Reviewed:  CBC Recent Labs  Lab 12/14/19 1657 12/15/19 0319  WBC 6.5 4.8  HGB 13.4 13.2  HCT 39.6 38.3  PLT 193 174  MCV 95.9 93.9  MCH 32.4 32.4  MCHC 33.8 34.5  RDW 12.5 12.4  LYMPHSABS 0.4*  --   MONOABS 0.6  --   EOSABS 0.0  --   BASOSABS 0.0  --     Chemistries  Recent Labs  Lab 12/14/19 1657 12/15/19 0319  NA 133* 136  K 3.6 3.0*  CL 97* 103  CO2 25 25  GLUCOSE 113* 98  BUN 22 12  CREATININE 0.81 0.69  CALCIUM 10.4* 9.8  MG  --  2.1  AST  --  22  ALT  --  20  ALKPHOS  --  49  BILITOT  --  1.0   ------------------------------------------------------------------------------------------------------------------ No results for input(s): CHOL, HDL, LDLCALC, TRIG, CHOLHDL, LDLDIRECT in the last 72 hours.  Lab Results  Component Value Date   HGBA1C 5.2 10/15/2015   ------------------------------------------------------------------------------------------------------------------ No results for input(s): TSH, T4TOTAL, T3FREE, THYROIDAB in the last 72 hours.  Invalid input(s): FREET3 ------------------------------------------------------------------------------------------------------------------ No results for input(s): VITAMINB12, FOLATE, FERRITIN, TIBC, IRON, RETICCTPCT in the last 72  hours.  Coagulation profile Recent Labs  Lab 12/14/19 1657 12/15/19 0319  INR 1.0 1.0    No results for input(s): DDIMER in the last 72 hours.  Cardiac Enzymes No results for input(s): CKMB, TROPONINI, MYOGLOBIN in the last 168 hours.  Invalid input(s): CK ------------------------------------------------------------------------------------------------------------------ No results found for: BNP  Micro Results Recent Results (from the past 240 hour(s))  SARS Coronavirus 2 by RT PCR (hospital order, performed in [facility] hospital lab) Nasopharyngeal Nasopharyngeal Swab     Status: None   Collection Time: 12/14/19  5:01 PM   Specimen: Nasopharyngeal Swab  Result Value Ref Range Status   SARS Coronavirus 2 NEGATIVE NEGATIVE Final    Comment: (NOTE) SARS-CoV-2 target nucleic acids are  NOT DETECTED.  The SARS-CoV-2 RNA is generally detectable in upper and lower respiratory specimens during the acute phase of infection. The lowest concentration of SARS-CoV-2 viral copies this assay can detect is 250 copies / mL. A negative result does not preclude SARS-CoV-2 infection and should not be used as the sole basis for treatment or other patient management decisions.  A negative result may occur with improper specimen collection / handling, submission of specimen other than nasopharyngeal swab, presence of viral mutation(s) within the areas targeted by this assay, and inadequate number of viral copies (<250 copies / mL). A negative result must be combined with clinical observations, patient history, and epidemiological information.  Fact Sheet for Patients:   StrictlyIdeas.no  Fact Sheet for Healthcare Providers: BankingDealers.co.za  This test is not yet approved or  cleared by the [place] FDA and has been authorized for detection and/or diagnosis of SARS-CoV-2 by FDA under an Emergency Use Authorization (EUA).  This EUA will  remain in effect (meaning this test can be used) for the duration of the COVID-19 declaration under Section 564(b)(1) of the Act, 21 U.S.C. section 360bbb-3(b)(1), unless the authorization is terminated or revoked sooner.  Performed at [facility], [address], [place], [place] [address]   MRSA PCR Screening     Status: None   Collection Time: 12/15/19  1:00 AM   Specimen: Nasopharyngeal  Result Value Ref Range Status   MRSA by PCR NEGATIVE NEGATIVE Final    Comment:        The GeneXpert MRSA Assay (FDA approved for NASAL specimens only), is one component of a comprehensive MRSA colonization surveillance program. It is not intended to diagnose MRSA infection nor to guide or monitor treatment for MRSA infections. Performed at [facility], [place] [address], [place], [place] [address]     Radiology Reports DG Chest 1 View  Result Date: 12/14/2019 CLINICAL DATA:  86 year old female with fall and hip fracture. EXAM: CHEST  1 VIEW COMPARISON:  None. FINDINGS: No focal consolidation, pleural effusion or pneumothorax. The cardiac silhouette is within limits. Atherosclerotic calcification of the aorta. No acute osseous pathology. IMPRESSION: No active disease. Electronically Signed   By: Anner Crete M.D.   On: 12/14/2019 18:27   DG Elbow Complete Left  Result Date: 12/14/2019 CLINICAL DATA:  Status post trauma. EXAM: LEFT ELBOW - COMPLETE 3+ VIEW COMPARISON:  None. FINDINGS: There is no evidence of fracture, dislocation, or joint effusion. There is no evidence of arthropathy or other focal bone abnormality. Soft tissues are unremarkable. IMPRESSION: Negative. Electronically Signed   By: Virgina Norfolk M.D.   On: 12/14/2019 18:35   DG Knee 2 Views Left  Result Date: 12/14/2019 CLINICAL DATA:  Status post fall. EXAM: LEFT KNEE - 1-2 VIEW COMPARISON:  None. FINDINGS: No evidence of fracture, dislocation, or joint effusion. Mild medial and lateral tibiofemoral compartment  space narrowing is seen with mild medial and lateral chondrocalcinosis. Soft tissues are unremarkable. IMPRESSION: Mild degenerative changes and chondrocalcinosis without evidence of acute osseous abnormality. Electronically Signed   By: Virgina Norfolk M.D.   On: 12/14/2019 18:34   DG C-Arm 1-60 Min  Result Date: 12/15/2019 CLINICAL DATA:  Left hip pinning EXAM: OPERATIVE LEFT HIP (WITH PELVIS IF PERFORMED) 2 VIEWS TECHNIQUE: Fluoroscopic spot image(s) were submitted for interpretation post-operatively. COMPARISON:  Radiography from yesterday FINDINGS: Nine intraprocedural fluoroscopic images show reduction and then cannulated screw fixation of the left femoral neck fracture. No visible joint penetration or other complicating  feature. Degenerative hip spurring. IMPRESSION: Fluoroscopy for femoral neck fracture fixation. No unexpected finding. Electronically Signed   By: Monte Fantasia M.D.   On: 12/15/2019 11:55   DG HIP PORT UNILAT W OR W/O PELVIS 1V LEFT  Result Date: 12/15/2019 CLINICAL DATA:  Femoral neck fixation EXAM: DG HIP (WITH OR WITHOUT PELVIS) 1V PORT LEFT COMPARISON:  Yesterday FINDINGS: Three cannulated screws traverse the left femoral neck fracture. Hardware is intact. No screw projecting into the joint space. No new osseous abnormality. IMPRESSION: 1. Interval left femoral neck fracture fixation. 2. No new abnormality. Electronically Signed   By: Monte Fantasia M.D.   On: 12/15/2019 11:58   DG HIP OPERATIVE UNILAT W OR W/O PELVIS LEFT  Result Date: 12/15/2019 CLINICAL DATA:  Left hip pinning EXAM: OPERATIVE LEFT HIP (WITH PELVIS IF PERFORMED) 2 VIEWS TECHNIQUE: Fluoroscopic spot image(s) were submitted for interpretation post-operatively. COMPARISON:  Radiography from yesterday FINDINGS: Nine intraprocedural fluoroscopic images show reduction and then cannulated screw fixation of the left femoral neck fracture. No visible joint penetration or other complicating feature. Degenerative  hip spurring. IMPRESSION: Fluoroscopy for femoral neck fracture fixation. No unexpected finding. Electronically Signed   By: Monte Fantasia M.D.   On: 12/15/2019 11:55   DG Hip Unilat With Pelvis 2-3 Views Left  Result Date: 12/14/2019 CLINICAL DATA:  LEFT hip pain post fall out of bed this morning EXAM: DG HIP (WITH OR WITHOUT PELVIS) 2-3V LEFT COMPARISON:  None FINDINGS: Osseous demineralization. Hip and SI joint spaces preserved. Impacted subcapital fracture of the LEFT femoral neck. No additional fracture, dislocation, or bone destruction. Degenerative disc disease changes at visualized lower lumbar spine. IMPRESSION: Impacted subcapital fracture of the LEFT femoral neck. Osseous demineralization. Electronically Signed   By: Lavonia Dana M.D.   On: 12/14/2019 17:43     Time Spent in minutes  30     Desiree Hane M.D on 12/15/2019 at 12:37 PM  To page go to [email or web address] - password [facility]

## 2019-12-15 NOTE — Consult Note (Signed)
Reason for Consult:Left hip fx Referring Physician: S Nettey  Peggy Ramirez is an 86 y.o. female.  HPI: Peggy Ramirez was getting out of bed yesterday morning. She's not exactly sure what happened but thinks she missed a step or slid out of the bed but ended up landing on the floor. She had immediate left hip and elbow pain and could not get up. She was brought to the ED where x-rays showed a left hip fx and orthopedic surgery was consulted. She lives alone and does not use any assistive devices regularly.  Past Medical History:  Diagnosis Date  . Chronic constipation   . Chronic left lower quadrant pain   . Headache(784.0)   . High cholesterol   . Hydrosalpinx    LEFT  . Hypertension   . Lupus ([place])   . 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 Ovarian fibroma    RIGHT  . Pelvic adhesions   . Sjogren's syndrome [facility])     Past Surgical History:  Procedure Laterality Date  . APPENDECTOMY  2002  . LAPAROSCOPIC CHOLECYSTECTOMY  2002  . TONSILLECTOMY  1941  . TOTAL ABDOMINAL HYSTERECTOMY W/ BILATERAL SALPINGOOPHORECTOMY  2005  . VAGINAL HYSTERECTOMY  1981    Family History  Problem Relation Age of Onset  . Heart disease Other   . Anxiety disorder Other   . Depression Other     Social History:  reports that she has never smoked. She has never used smokeless tobacco. She reports that she does not drink alcohol. No history on file for drug use.  Allergies:  Allergies  Allergen Reactions  . Atorvastatin Other (See Comments)    Muscle aches  . Cellcept [Mycophenolate Mofetil]   . Levofloxacin     REACTION: rash  . Medrol [Methylprednisolone]   . Mycophenolate Mofetil   . Penicillins     REACTION: itching  . Sulfonamide Derivatives     REACTION: nausea  . Topiramate   . Verapamil     Felt shakey    Medications: I have reviewed the patient's current medications.  Results for orders placed or performed during the hospital encounter of 12/14/19  (from the past 48 hour(s))  Basic metabolic panel     Status: Abnormal   Collection Time: 12/14/19  4:57 PM  Result Value Ref Range   Sodium 133 (L) 135 - 145 mmol/L   Potassium 3.6 3.5 - 5.1 mmol/L   Chloride 97 (L) 98 - 111 mmol/L   CO2 25 22 - 32 mmol/L   Glucose, Bld 113 (H) 70 - 99 mg/dL    Comment: Glucose reference range applies only to samples taken after fasting for at least 8 hours.   BUN 22 8 - 23 mg/dL   Creatinine, Ser 0.81 0.44 - 1.00 mg/dL   Calcium 10.4 (H) 8.9 - 10.3 mg/dL   GFR calc non Af Amer >60 >60 mL/min   GFR calc Af Amer >60 >60 mL/min   Anion gap 11 5 - 15    Comment: Performed at [facility], [address] [place], [place] [address]  CBC WITH DIFFERENTIAL     Status: Abnormal   Collection Time: 12/14/19  4:57 PM  Result Value Ref Range   WBC 6.5 4.0 - 10.5 K/uL   RBC 4.13 3.87 - 5.11 MIL/uL   Hemoglobin 13.4 12.0 - 15.0 g/dL   HCT 39.6 36 - 46 %   MCV 95.9 80.0 - 100.0 fL   MCH 32.4  26.0 - 34.0 pg   MCHC 33.8 30.0 - 36.0 g/dL   RDW 12.5 11.5 - 15.5 %   Platelets 193 150 - 400 K/uL   nRBC 0.0 0.0 - 0.2 %   Neutrophils Relative % 82 %   Neutro Abs 5.4 1.7 - 7.7 K/uL   Lymphocytes Relative 7 %   Lymphs Abs 0.4 (L) 0.7 - 4.0 K/uL   Monocytes Relative 10 %   Monocytes Absolute 0.6 0 - 1 K/uL   Eosinophils Relative 1 %   Eosinophils Absolute 0.0 0 - 0 K/uL   Basophils Relative 0 %   Basophils Absolute 0.0 0 - 0 K/uL   Immature Granulocytes 0 %   Abs Immature Granulocytes 0.02 0.00 - 0.07 K/uL    Comment: Performed at [facility], [address], [place], [place] [address]  Protime-INR     Status: None   Collection Time: 12/14/19  4:57 PM  Result Value Ref Range   Prothrombin Time 12.5 11.4 - 15.2 seconds   INR 1.0 0.8 - 1.2    Comment: (NOTE) INR goal varies based on device and disease states. Performed at [facility], [address] [place], [place] [address]   Type and screen [facility]     Status: None   Collection Time:  12/14/19  4:57 PM  Result Value Ref Range   ABO/RH(D) O NEG    Antibody Screen NEG    Sample Expiration      12/17/2019,2359 Performed at [facility], [address] [place], [place] [address]   SARS Coronavirus 2 by RT PCR (hospital order, performed in [facility] hospital lab) Nasopharyngeal Nasopharyngeal Swab     Status: None   Collection Time: 12/14/19  5:01 PM   Specimen: Nasopharyngeal Swab  Result Value Ref Range   SARS Coronavirus 2 NEGATIVE NEGATIVE    Comment: (NOTE) SARS-CoV-2 target nucleic acids are NOT DETECTED.  The SARS-CoV-2 RNA is generally detectable in upper and lower respiratory specimens during the acute phase of infection. The lowest concentration of SARS-CoV-2 viral copies this assay can detect is 250 copies / mL. A negative result does not preclude SARS-CoV-2 infection and should not be used as the sole basis for treatment or other patient management decisions.  A negative result may occur with improper specimen collection / handling, submission of specimen other than nasopharyngeal swab, presence of viral mutation(s) within the areas targeted by this assay, and inadequate number of viral copies (<250 copies / mL). A negative result must be combined with clinical observations, patient history, and epidemiological information.  Fact Sheet for Patients:   StrictlyIdeas.no  Fact Sheet for Healthcare Providers: BankingDealers.co.za  This test is not yet approved or  cleared by the [place] FDA and has been authorized for detection and/or diagnosis of SARS-CoV-2 by FDA under an Emergency Use Authorization (EUA).  This EUA will remain in effect (meaning this test can be used) for the duration of the COVID-19 declaration under Section 564(b)(1) of the Act, 21 U.S.C. section 360bbb-3(b)(1), unless the authorization is terminated or revoked sooner.  Performed at [facility], [address],  [place], [place] [address]   ABO/Rh     Status: None   Collection Time: 12/14/19  7:10 PM  Result Value Ref Range   ABO/RH(D)      Jenetta Downer NEG Performed at [facility], [address], [place], [place] [address]   Urinalysis, Routine w reflex microscopic Urine, Catheterized     Status: Abnormal  Collection Time: 12/14/19 10:23 PM  Result Value Ref Range   Color, Urine STRAW (A) YELLOW   APPearance CLEAR CLEAR   Specific Gravity, Urine 1.009 1.005 - 1.030   pH 7.0 5.0 - 8.0   Glucose, UA NEGATIVE NEGATIVE mg/dL   Hgb urine dipstick NEGATIVE NEGATIVE   Bilirubin Urine NEGATIVE NEGATIVE   Ketones, ur NEGATIVE NEGATIVE mg/dL   Protein, ur NEGATIVE NEGATIVE mg/dL   Nitrite NEGATIVE NEGATIVE   Leukocytes,Ua NEGATIVE NEGATIVE    Comment: Performed at [facility], [address] [place], [place] [address]  MRSA PCR Screening     Status: None   Collection Time: 12/15/19  1:00 AM   Specimen: Nasopharyngeal  Result Value Ref Range   MRSA by PCR NEGATIVE NEGATIVE    Comment:        The GeneXpert MRSA Assay (FDA approved for NASAL specimens only), is one component of a comprehensive MRSA colonization surveillance program. It is not intended to diagnose MRSA infection nor to guide or monitor treatment for MRSA infections. Performed at [facility], [place] [address], [place], [place] [address]   Comprehensive metabolic panel     Status: Abnormal   Collection Time: 12/15/19  3:19 AM  Result Value Ref Range   Sodium 136 135 - 145 mmol/L   Potassium 3.0 (L) 3.5 - 5.1 mmol/L   Chloride 103 98 - 111 mmol/L   CO2 25 22 - 32 mmol/L   Glucose, Bld 98 70 - 99 mg/dL    Comment: Glucose reference range applies only to samples taken after fasting for at least 8 hours.   BUN 12 8 - 23 mg/dL   Creatinine, Ser 0.69 0.44 - 1.00 mg/dL   Calcium 9.8 8.9 - 10.3 mg/dL   Total Protein 5.9 (L) 6.5 - 8.1 g/dL   Albumin 3.1 (L) 3.5 - 5.0 g/dL   AST 22 15 - 41 U/L   ALT 20 0 - 44 U/L   Alkaline  Phosphatase 49 38 - 126 U/L   Total Bilirubin 1.0 0.3 - 1.2 mg/dL   GFR calc non Af Amer >60 >60 mL/min   GFR calc Af Amer >60 >60 mL/min   Anion gap 8 5 - 15    Comment: Performed at [place] [address] [place], [place] [address]  Magnesium     Status: None   Collection Time: 12/15/19  3:19 AM  Result Value Ref Range   Magnesium 2.1 1.7 - 2.4 mg/dL    Comment: Performed at [place] [address] [place] [address]  CBC     Status: None   Collection Time: 12/15/19  3:19 AM  Result Value Ref Range   WBC 4.8 4.0 - 10.5 K/uL   RBC 4.08 3.87 - 5.11 MIL/uL   Hemoglobin 13.2 12.0 - 15.0 g/dL   HCT 38.3 36 - 46 %   MCV 93.9 80.0 - 100.0 fL   MCH 32.4 26.0 - 34.0 pg   MCHC 34.5 30.0 - 36.0 g/dL   RDW 12.4 11.5 - 15.5 %   Platelets 174 150 - 400 K/uL   nRBC 0.0 0.0 - 0.2 %    Comment: Performed at [facility], [place] [address] [place], [place] [address]  Protime-INR     Status: None   Collection Time: 12/15/19  3:19 AM  Result Value Ref Range   Prothrombin Time 13.1 11.4 - 15.2 seconds   INR 1.0 0.8 -  1.2    Comment: (NOTE) INR goal varies based on device and disease states. Performed at [facility], [place] [address] [place], [place] [address]     DG Chest 1 View  Result Date: 12/14/2019 CLINICAL DATA:  86 year old female with fall and hip fracture. EXAM: CHEST  1 VIEW COMPARISON:  None. FINDINGS: No focal consolidation, pleural effusion or pneumothorax. The cardiac silhouette is within limits. Atherosclerotic calcification of the aorta. No acute osseous pathology. IMPRESSION: No active disease. Electronically Signed   By: Anner Crete M.D.   On: 12/14/2019 18:27   DG Elbow Complete Left  Result Date: 12/14/2019 CLINICAL DATA:  Status post trauma. EXAM: LEFT ELBOW - COMPLETE 3+ VIEW COMPARISON:  None. FINDINGS: There is no evidence of fracture, dislocation, or joint effusion. There is no evidence of arthropathy or other focal bone  abnormality. Soft tissues are unremarkable. IMPRESSION: Negative. Electronically Signed   By: Virgina Norfolk M.D.   On: 12/14/2019 18:35   DG Knee 2 Views Left  Result Date: 12/14/2019 CLINICAL DATA:  Status post fall. EXAM: LEFT KNEE - 1-2 VIEW COMPARISON:  None. FINDINGS: No evidence of fracture, dislocation, or joint effusion. Mild medial and lateral tibiofemoral compartment space narrowing is seen with mild medial and lateral chondrocalcinosis. Soft tissues are unremarkable. IMPRESSION: Mild degenerative changes and chondrocalcinosis without evidence of acute osseous abnormality. Electronically Signed   By: Virgina Norfolk M.D.   On: 12/14/2019 18:34   DG Hip Unilat With Pelvis 2-3 Views Left  Result Date: 12/14/2019 CLINICAL DATA:  LEFT hip pain post fall out of bed this morning EXAM: DG HIP (WITH OR WITHOUT PELVIS) 2-3V LEFT COMPARISON:  None FINDINGS: Osseous demineralization. Hip and SI joint spaces preserved. Impacted subcapital fracture of the LEFT femoral neck. No additional fracture, dislocation, or bone destruction. Degenerative disc disease changes at visualized lower lumbar spine. IMPRESSION: Impacted subcapital fracture of the LEFT femoral neck. Osseous demineralization. Electronically Signed   By: Lavonia Dana M.D.   On: 12/14/2019 17:43    Review of Systems  HENT: Negative for ear discharge, ear pain, hearing loss and tinnitus.   Eyes: Negative for photophobia and pain.  Respiratory: Negative for cough and shortness of breath.   Cardiovascular: Negative for chest pain.  Gastrointestinal: Negative for abdominal pain, nausea and vomiting.  Genitourinary: Negative for dysuria, flank pain, frequency and urgency.  Musculoskeletal: Positive for arthralgias (Left hip, elbow). Negative for back pain, myalgias and neck pain.  Neurological: Negative for dizziness and headaches.  Hematological: Does not bruise/bleed easily.  Psychiatric/Behavioral: The patient is not nervous/anxious.     Blood pressure 135/63, pulse 68, temperature 99.1 F (37.3 C), temperature source Oral, resp. rate 16, height 5' (1.524 m), weight 49.9 kg, SpO2 95 %. Physical Exam Constitutional:      General: She is not in acute distress.    Appearance: She is well-developed. She is not diaphoretic.  HENT:     Head: Normocephalic and atraumatic.  Eyes:     General: No scleral icterus.       Right eye: No discharge.        Left eye: No discharge.     Conjunctiva/sclera: Conjunctivae normal.  Cardiovascular:     Rate and Rhythm: Normal rate and regular rhythm.  Pulmonary:     Effort: Pulmonary effort is normal. No respiratory distress.  Musculoskeletal:     Cervical back: Normal range of motion.     Comments: LLE No traumatic wounds, ecchymosis, or rash  Mild TTP hip  No knee or ankle effusion  Knee stable to varus/ valgus and anterior/posterior stress  Sens DPN, SPN, TN intact  Motor EHL, ext, flex, evers 5/5  DP 2+, PT 2+, No significant edema  Skin:    General: Skin is warm and dry.  Neurological:     Mental Status: She is alert.  Psychiatric:        Behavior: Behavior normal.     Assessment/Plan: Left hip fx -- Plan cannulated pinning this morning by Dr. Doreatha Martin. Please keep NPO. Multiple medical problems including Sjogren's, lupus, hypertension, hyperlipidemia, and chronic constipation -- per primary service    Lisette Abu, PA-C Orthopedic Surgery [phone] 12/15/2019, 8:29 AM

## 2019-12-15 NOTE — Progress Notes (Signed)
Per Dr. Fransisco Beau, a Type and Screen does not need to be drawn for the surgery.

## 2019-12-16 ENCOUNTER — Encounter (HOSPITAL_COMMUNITY): Payer: Self-pay | Admitting: Student

## 2019-12-16 LAB — CBC
HCT: 36.7 % (ref 36.0–46.0)
Hemoglobin: 12.5 g/dL (ref 12.0–15.0)
MCH: 32 pg (ref 26.0–34.0)
MCHC: 34.1 g/dL (ref 30.0–36.0)
MCV: 93.9 fL (ref 80.0–100.0)
Platelets: 164 10*3/uL (ref 150–400)
RBC: 3.91 MIL/uL (ref 3.87–5.11)
RDW: 12.4 % (ref 11.5–15.5)
WBC: 5.7 10*3/uL (ref 4.0–10.5)
nRBC: 0 % (ref 0.0–0.2)

## 2019-12-16 LAB — BASIC METABOLIC PANEL
Anion gap: 7 (ref 5–15)
BUN: 16 mg/dL (ref 8–23)
CO2: 22 mmol/L (ref 22–32)
Calcium: 9.6 mg/dL (ref 8.9–10.3)
Chloride: 103 mmol/L (ref 98–111)
Creatinine, Ser: 0.78 mg/dL (ref 0.44–1.00)
GFR calc Af Amer: >60 mL/min (ref >60)
GFR calc non Af Amer: >60 mL/min (ref >60)
Glucose, Bld: 112 mg/dL — ABNORMAL HIGH (ref 70–99)
Potassium: 3.6 mmol/L (ref 3.5–5.1)
Sodium: 132 mmol/L — ABNORMAL LOW (ref 135–145)

## 2019-12-16 LAB — MAGNESIUM: Magnesium: 2.1 mg/dL (ref 1.7–2.4)

## 2019-12-16 MED ORDER — ACETAMINOPHEN 650 MG RE SUPP: 325.0000 mg | Freq: Four times a day (QID) | RECTAL | Status: DC | PRN

## 2019-12-16 MED ORDER — HYDROCODONE-ACETAMINOPHEN 5-325 MG PO TABS
1.0000 | ORAL_TABLET | Freq: Four times a day (QID) | ORAL | 0 refills | Status: DC | PRN
Start: 2019-12-16 — End: 2020-11-19

## 2019-12-16 MED ORDER — MORPHINE SULFATE (PF) 2 MG/ML IV SOLN: 1.0000 mg | INTRAVENOUS | Status: DC | PRN

## 2019-12-16 MED ORDER — ONDANSETRON HCL 4 MG PO TABS: 4.0000 mg | ORAL_TABLET | Freq: Four times a day (QID) | ORAL | 0 refills | Status: DC | PRN

## 2019-12-16 MED ORDER — HYDROCODONE-ACETAMINOPHEN 5-325 MG PO TABS: 1.0000 | ORAL_TABLET | Freq: Four times a day (QID) | ORAL | 0 refills | Status: DC | PRN

## 2019-12-16 MED ORDER — ACETAMINOPHEN 325 MG PO TABS
325.0000 mg | ORAL_TABLET | Freq: Four times a day (QID) | ORAL | Status: DC | PRN
  Administered 2019-12-16: 650 mg via ORAL
  Filled 2019-12-16: qty 2

## 2019-12-16 MED ORDER — HYDROCODONE-ACETAMINOPHEN 5-325 MG PO TABS: 1.0000 | ORAL_TABLET | Freq: Four times a day (QID) | ORAL | Status: DC | PRN

## 2019-12-16 MED ORDER — FENTANYL CITRATE (PF) 100 MCG/2ML IJ SOLN: 25.0000 ug | INTRAMUSCULAR | Status: DC | PRN

## 2019-12-16 MED ORDER — ASPIRIN 325 MG PO TBEC: 325.0000 mg | DELAYED_RELEASE_TABLET | Freq: Two times a day (BID) | ORAL | 0 refills | Status: AC

## 2019-12-16 MED ORDER — OXYCODONE HCL 5 MG PO TABS
5.0000 mg | ORAL_TABLET | ORAL | Status: DC | PRN
  Administered 2019-12-17: 5 mg via ORAL
  Filled 2019-12-16: qty 1

## 2019-12-16 MED ORDER — ASPIRIN 325 MG PO TBEC: 325.0000 mg | DELAYED_RELEASE_TABLET | Freq: Two times a day (BID) | ORAL | 0 refills | Status: DC

## 2019-12-16 NOTE — Plan of Care (Signed)

## 2019-12-16 NOTE — Progress Notes (Signed)
[facility]  PROGRESS NOTE  Sagal BRITTNI HULT SVX:[id number] DOB: 1933-09-23 DOA: 12/14/2019 PCP: Manon Hilding, MD Admit date - 12/14/2019   Admitting Physician Rolla Plate, DO  Outpatient Primary MD for the patient is Sasser, Silvestre Moment, MD  LOS - 2 Brief Narrative   Brienna BLONDINE HOTTEL is a 86 y.o. year old female with medical history significant for Sjogren's, lupus, hypertension, hyperlipidemia, and chronic constipation who presented on 12/14/2019 withPersistent left hip pain after missing a step while getting out of bed about LOC or head trauma and was found to have subcapital fracture left femoral neck on x-ray.  Patient percutaneous fixation of left femoral neck fracture on 8/19 by Dr. Doreatha Martin.   Subjective  Doing well after procedure.  Walked with physical therapy this morning A & P   Left impacted femoral neck fracture after fall, status post percutaneous fixation of left femoral neck fracture on 8/19 by Dr. Devota Pace as tolerated to left lower extremity per Ortho -To receive postoperative Ancef -Lovenox for DVT prophylaxis while inpatient -On discharge 325 mg twice daily -PT/OT recommend SNF versus home health PT if able to have 24-hour assistance (P6 at home by herself) -Pain control with as needed IV morphine for severe pain every 2 hours as needed, OxyIR 5 mg every 4 hours as needed moderate pain, Tylenol as needed every 6 hours mild pain -Follow-up with Dr. Doreatha Martin in 2 weeks, Ortho to remove dressing on 8/21  Mechanical fall.  No preceding symptoms, no head trauma. -PT/OT  Reported urinary retention.  UA unremarkable.  Foley catheter inserted prior to the OR -Voiding trial, remove Foley, monitor bladder scan -Monitor intake/output  Chronic constipation -Monitor BM -Continue home MiraLAX daily, add senna docusate BID   HTN, at goal -Continue home HCTZ 12.5 mg daily-monitor BMP  HLD -Has not tolerated atorvastatin in the past (muscle  aches)  Prior CVA (09/2015).  No focal deficits on exam -Held home aspirin prior to the OR on admission -On discharge for DVT prophylaxis "out on aspirin 325 mg twice daily  GERD, stable -Continue home Protonix  Hypokalemia, chronic. In setting of HCTZ -correct potassium -daily mg and bmp, replete K as needed   Family Communication  :  Son [name] updated at bedside on 8/19  Code Status : Full code, discussed on day of admission  Disposition Plan  :  Patient is from home. Anticipated d/c date:  1 to 2 days, pending if patient able to have 24-hour assistance versus SNF placement barriers to d/c or necessity for inpatient status:  Medically stable Consults  : Orthopedics  Procedures  :  Femoral neck fracture repair by Dr. Doreatha Martin on 8/19  DVT Prophylaxis  :  Lovenox   Lab Results  Component Value Date   PLT 164 12/16/2019    Diet :  Diet Order            DIET SOFT Room service appropriate? Yes; Fluid consistency: Thin  Diet effective now                  Inpatient Medications Scheduled Meds: . enoxaparin (LOVENOX) injection  40 mg Subcutaneous Q24H  . hydrochlorothiazide  12.5 mg Oral Daily  . loratadine  10 mg Oral Daily  . pantoprazole  40 mg Oral BID  . polyethylene glycol  17 g Oral Daily  . senna-docusate  2 tablet Oral BID   Continuous Infusions: . sodium chloride Stopped (12/15/19 0849)   PRN Meds:.acetaminophen **OR** acetaminophen, bisacodyl, hydrALAZINE,  HYDROcodone-acetaminophen, melatonin, morphine injection, ondansetron **OR** ondansetron (ZOFRAN) IV, oxyCODONE  Antibiotics  :   Anti-infectives (From admission, onward)   Start     Dose/Rate Route Frequency Ordered Stop   12/15/19 1800  ceFAZolin (ANCEF) IVPB 2g/100 mL premix        2 g 200 mL/hr over 30 Minutes Intravenous Every 8 hours 12/15/19 1219 12/16/19 1123   12/15/19 0918  ceFAZolin (ANCEF) 2-4 GM/100ML-% IVPB       Note to Pharmacy: Grace Blight   : cabinet override      12/15/19 0918  12/15/19 0930       Objective   Vitals:   12/15/19 1950 12/16/19 0246 12/16/19 0813 12/16/19 1318  BP: (!) 113/53 127/61 (!) 111/52 (!) 141/64  Pulse: 75 (!) 59 62 73  Resp: 14 17 17 17   Temp: 98.7 F (37.1 C)  98.2 F (36.8 C) 97.9 F (36.6 C)  TempSrc: Oral  Oral Oral  SpO2: 96% 97% 96% 99%  Weight:      Height:        SpO2: 99 % O2 Flow Rate (L/min): 3 L/min  Wt Readings from Last 3 Encounters:  12/15/19 49.9 kg  10/11/19 57.7 kg  01/20/18 62 kg     Intake/Output Summary (Last 24 hours) at 12/16/2019 1537 Last data filed at 12/16/2019 0600 Gross per 24 hour  Intake 914.15 ml  Output 800 ml  Net 114.15 ml    Physical Exam:     Awake Alert, Oriented X 3, Normal affect No new F.N deficits,  [place].AT, Normal respiratory effort on room air, CTAB RRR,No Gallops,Rubs or new Murmurs,  +ve B.Sounds, Abd Soft, No tenderness, No rebound, guarding or rigidity. No Cyanosis, No new Rash or bruise Postoperative dressing in place on left hip   I have personally reviewed the following:   Data Reviewed:  CBC Recent Labs  Lab 12/14/19 1657 12/15/19 0319 12/16/19 0404  WBC 6.5 4.8 5.7  HGB 13.4 13.2 12.5  HCT 39.6 38.3 36.7  PLT 193 174 164  MCV 95.9 93.9 93.9  MCH 32.4 32.4 32.0  MCHC 33.8 34.5 34.1  RDW 12.5 12.4 12.4  LYMPHSABS 0.4*  --   --   MONOABS 0.6  --   --   EOSABS 0.0  --   --   BASOSABS 0.0  --   --     Chemistries  Recent Labs  Lab 12/14/19 1657 12/15/19 0319 12/16/19 0404  NA 133* 136 132*  K 3.6 3.0* 3.6  CL 97* 103 103  CO2 25 25 22   GLUCOSE 113* 98 112*  BUN 22 12 16   CREATININE 0.81 0.69 0.78  CALCIUM 10.4* 9.8 9.6  MG  --  2.1 2.1  AST  --  22  --   ALT  --  20  --   ALKPHOS  --  49  --   BILITOT  --  1.0  --    ------------------------------------------------------------------------------------------------------------------ No results for input(s): CHOL, HDL, LDLCALC, TRIG, CHOLHDL, LDLDIRECT in the last 72  hours.  Lab Results  Component Value Date   HGBA1C 5.2 10/15/2015   ------------------------------------------------------------------------------------------------------------------ No results for input(s): TSH, T4TOTAL, T3FREE, THYROIDAB in the last 72 hours.  Invalid input(s): FREET3 ------------------------------------------------------------------------------------------------------------------ No results for input(s): VITAMINB12, FOLATE, FERRITIN, TIBC, IRON, RETICCTPCT in the last 72 hours.  Coagulation profile Recent Labs  Lab 12/14/19 1657 12/15/19 0319  INR 1.0 1.0    No results for input(s): DDIMER in the last 72 hours.  Cardiac Enzymes No results for input(s): CKMB, TROPONINI, MYOGLOBIN in the last 168 hours.  Invalid input(s): CK ------------------------------------------------------------------------------------------------------------------ No results found for: BNP  Micro Results Recent Results (from the past 240 hour(s))  SARS Coronavirus 2 by RT PCR (hospital order, performed in [facility] hospital lab) Nasopharyngeal Nasopharyngeal Swab     Status: None   Collection Time: 12/14/19  5:01 PM   Specimen: Nasopharyngeal Swab  Result Value Ref Range Status   SARS Coronavirus 2 NEGATIVE NEGATIVE Final    Comment: (NOTE) SARS-CoV-2 target nucleic acids are NOT DETECTED.  The SARS-CoV-2 RNA is generally detectable in upper and lower respiratory specimens during the acute phase of infection. The lowest concentration of SARS-CoV-2 viral copies this assay can detect is 250 copies / mL. A negative result does not preclude SARS-CoV-2 infection and should not be used as the sole basis for treatment or other patient management decisions.  A negative result may occur with improper specimen collection / handling, submission of specimen other than nasopharyngeal swab, presence of viral mutation(s) within the areas targeted by this assay, and inadequate number of  viral copies (<250 copies / mL). A negative result must be combined with clinical observations, patient history, and epidemiological information.  Fact Sheet for Patients:   StrictlyIdeas.no  Fact Sheet for Healthcare Providers: BankingDealers.co.za  This test is not yet approved or  cleared by the [place] FDA and has been authorized for detection and/or diagnosis of SARS-CoV-2 by FDA under an Emergency Use Authorization (EUA).  This EUA will remain in effect (meaning this test can be used) for the duration of the COVID-19 declaration under Section 564(b)(1) of the Act, 21 U.S.C. section 360bbb-3(b)(1), unless the authorization is terminated or revoked sooner.  Performed at [facility], [address], [place], [place] [address]   MRSA PCR Screening     Status: None   Collection Time: 12/15/19  1:00 AM   Specimen: Nasopharyngeal  Result Value Ref Range Status   MRSA by PCR NEGATIVE NEGATIVE Final    Comment:        The GeneXpert MRSA Assay (FDA approved for NASAL specimens only), is one component of a comprehensive MRSA colonization surveillance program. It is not intended to diagnose MRSA infection nor to guide or monitor treatment for MRSA infections. Performed at [facility], [place] [address] [place],  [address]     Radiology Reports DG Chest 1 View  Result Date: 12/14/2019 CLINICAL DATA:  86 year old female with fall and hip fracture. EXAM: CHEST  1 VIEW COMPARISON:  None. FINDINGS: No focal consolidation, pleural effusion or pneumothorax. The cardiac silhouette is within limits. Atherosclerotic calcification of the aorta. No acute osseous pathology. IMPRESSION: No active disease. Electronically Signed   By: Anner Crete M.D.   On: 12/14/2019 18:27   DG Elbow Complete Left  Result Date: 12/14/2019 CLINICAL DATA:  Status post trauma. EXAM: LEFT ELBOW - COMPLETE 3+ VIEW COMPARISON:  None. FINDINGS:  There is no evidence of fracture, dislocation, or joint effusion. There is no evidence of arthropathy or other focal bone abnormality. Soft tissues are unremarkable. IMPRESSION: Negative. Electronically Signed   By: Virgina Norfolk M.D.   On: 12/14/2019 18:35   DG Knee 2 Views Left  Result Date: 12/14/2019 CLINICAL DATA:  Status post fall. EXAM: LEFT KNEE - 1-2 VIEW COMPARISON:  None. FINDINGS: No evidence of fracture, dislocation, or joint effusion. Mild medial and lateral tibiofemoral compartment space narrowing is seen with mild medial and lateral chondrocalcinosis.  Soft tissues are unremarkable. IMPRESSION: Mild degenerative changes and chondrocalcinosis without evidence of acute osseous abnormality. Electronically Signed   By: Virgina Norfolk M.D.   On: 12/14/2019 18:34   DG C-Arm 1-60 Min  Result Date: 12/15/2019 CLINICAL DATA:  Left hip pinning EXAM: OPERATIVE LEFT HIP (WITH PELVIS IF PERFORMED) 2 VIEWS TECHNIQUE: Fluoroscopic spot image(s) were submitted for interpretation post-operatively. COMPARISON:  Radiography from yesterday FINDINGS: Nine intraprocedural fluoroscopic images show reduction and then cannulated screw fixation of the left femoral neck fracture. No visible joint penetration or other complicating feature. Degenerative hip spurring. IMPRESSION: Fluoroscopy for femoral neck fracture fixation. No unexpected finding. Electronically Signed   By: Monte Fantasia M.D.   On: 12/15/2019 11:55   DG HIP PORT UNILAT W OR W/O PELVIS 1V LEFT  Result Date: 12/15/2019 CLINICAL DATA:  Femoral neck fixation EXAM: DG HIP (WITH OR WITHOUT PELVIS) 1V PORT LEFT COMPARISON:  Yesterday FINDINGS: Three cannulated screws traverse the left femoral neck fracture. Hardware is intact. No screw projecting into the joint space. No new osseous abnormality. IMPRESSION: 1. Interval left femoral neck fracture fixation. 2. No new abnormality. Electronically Signed   By: Monte Fantasia M.D.   On: 12/15/2019  11:58   DG HIP OPERATIVE UNILAT W OR W/O PELVIS LEFT  Result Date: 12/15/2019 CLINICAL DATA:  Left hip pinning EXAM: OPERATIVE LEFT HIP (WITH PELVIS IF PERFORMED) 2 VIEWS TECHNIQUE: Fluoroscopic spot image(s) were submitted for interpretation post-operatively. COMPARISON:  Radiography from yesterday FINDINGS: Nine intraprocedural fluoroscopic images show reduction and then cannulated screw fixation of the left femoral neck fracture. No visible joint penetration or other complicating feature. Degenerative hip spurring. IMPRESSION: Fluoroscopy for femoral neck fracture fixation. No unexpected finding. Electronically Signed   By: Monte Fantasia M.D.   On: 12/15/2019 11:55   DG Hip Unilat With Pelvis 2-3 Views Left  Result Date: 12/14/2019 CLINICAL DATA:  LEFT hip pain post fall out of bed this morning EXAM: DG HIP (WITH OR WITHOUT PELVIS) 2-3V LEFT COMPARISON:  None FINDINGS: Osseous demineralization. Hip and SI joint spaces preserved. Impacted subcapital fracture of the LEFT femoral neck. No additional fracture, dislocation, or bone destruction. Degenerative disc disease changes at visualized lower lumbar spine. IMPRESSION: Impacted subcapital fracture of the LEFT femoral neck. Osseous demineralization. Electronically Signed   By: Lavonia Dana M.D.   On: 12/14/2019 17:43     Time Spent in minutes  30     Desiree Hane M.D on 12/16/2019 at 3:37 PM  To page go to [email or web address] - password [facility]

## 2019-12-16 NOTE — Evaluation (Signed)
Physical Therapy Evaluation Patient Details Name: Peggy Ramirez MRN: [id number] DOB: Apr 10, 1934 Today's Date: 12/16/2019   History of Present Illness  Patient is an 86 year old female admitted after falling from bed. Found to have hip fracture and is s/p ORIF. PMH includes: HTN, hypokalemia.  Clinical Impression  Patient received in bed, alert, pleasant, ready to get up. Patient reports mild pain in left hip mostly during exercise. She requires min assist with bed mobility, min assist for sit to stand with cues for hand placement. She ambulated 20 feet with RW and min guard. Step to pattern with cues for sequencing and WB status. She will continue to benefit from skilled PT while here to improve functional independence and safety with mobility.     Follow Up Recommendations Home health PT;Supervision - Intermittent    Equipment Recommendations  None recommended by PT    Recommendations for Other Services       Precautions / Restrictions Precautions Precautions: Fall Restrictions Weight Bearing Restrictions: Yes LLE Weight Bearing: Weight bearing as tolerated      Mobility  Bed Mobility Overal bed mobility: Needs Assistance Bed Mobility: Supine to Sit     Supine to sit: Min assist        Transfers Overall transfer level: Needs assistance Equipment used: Rolling walker (2 wheeled) Transfers: Sit to/from Stand Sit to Stand: Min assist            Ambulation/Gait Ambulation/Gait assistance: Min guard Gait Distance (Feet): 20 Feet Assistive device: Rolling walker (2 wheeled) Gait Pattern/deviations: Step-to pattern;Decreased stride length;Decreased step length - right;Decreased step length - left;Narrow base of support Gait velocity: decr   General Gait Details: patient without lob, cues needed for WB status.  Stairs            Wheelchair Mobility    Modified Rankin (Stroke Patients Only)       Balance Overall balance assessment: Needs  assistance Sitting-balance support: Feet supported Sitting balance-Leahy Scale: Good     Standing balance support: Bilateral upper extremity supported;During functional activity Standing balance-Leahy Scale: Fair Standing balance comment: reliant on RW and external assist for safety                             Pertinent Vitals/Pain Pain Assessment: Faces Faces Pain Scale: Hurts a little bit Pain Location: L hip Pain Descriptors / Indicators: Discomfort;Sore Pain Intervention(s): Monitored during session;Limited activity within patient's tolerance;Repositioned    Home Living Family/patient expects to be discharged to:: Private residence Living Arrangements: Alone Available Help at Discharge: Family;Friend(s);Available PRN/intermittently Type of Home: House Home Access: Stairs to enter   [employer] of Steps: 1 Home Layout: One level Home Equipment: Walker - 2 wheels      Prior Function Level of Independence: Independent               Hand Dominance        Extremity/Trunk Assessment   Upper Extremity Assessment Upper Extremity Assessment: Defer to OT evaluation    Lower Extremity Assessment Lower Extremity Assessment: Generalized weakness    Cervical / Trunk Assessment Cervical / Trunk Assessment: Normal  Communication   Communication: HOH  Cognition Arousal/Alertness: Awake/alert Behavior During Therapy: WFL for tasks assessed/performed Overall Cognitive Status: Within Functional Limits for tasks assessed  General Comments      Exercises Other Exercises Other Exercises: B LE exercises: LAQ, AP, heel slides, hip abd/add x10 reps each. Requires min assist with heel slides and hip abd   Assessment/Plan    PT Assessment Patient needs continued PT services  PT Problem List Decreased strength;Decreased mobility;Decreased safety awareness;Decreased activity tolerance;Decreased  balance;Pain;Decreased knowledge of use of DME       PT Treatment Interventions DME instruction;Therapeutic activities;Gait training;Therapeutic exercise;Patient/family education;Stair training;Balance training;Functional mobility training    PT Goals (Current goals can be found in the Care Plan section)  Acute Rehab PT Goals Patient Stated Goal: to get back to independence PT Goal Formulation: With patient Time For Goal Achievement: 12/23/19 Potential to Achieve Goals: Good    Frequency Min 5X/week   Barriers to discharge Decreased caregiver support patient lives alone, but may be able to return with intermittent assist and HHPT    Co-evaluation               AM-PAC PT "6 Clicks" Mobility  Outcome Measure Help needed turning from your back to your side while in a flat bed without using bedrails?: A Little Help needed moving from lying on your back to sitting on the side of a flat bed without using bedrails?: A Little Help needed moving to and from a bed to a chair (including a wheelchair)?: A Little Help needed standing up from a chair using your arms (e.g., wheelchair or bedside chair)?: A Little Help needed to walk in hospital room?: A Little Help needed climbing 3-5 steps with a railing? : A Little 6 Click Score: 18    End of Session Equipment Utilized During Treatment: Gait belt Activity Tolerance: Patient tolerated treatment well Patient left: in chair;with call bell/phone within reach;with chair alarm set Nurse Communication: Mobility status PT Visit Diagnosis: Muscle weakness (generalized) (M62.81);Pain;Difficulty in walking, not elsewhere classified (R26.2) Pain - Right/Left: Left Pain - part of body: Hip    Time: 0925-0950 PT Time Calculation (min) (ACUTE ONLY): 25 min   Charges:   PT Evaluation $PT Eval Moderate Complexity: 1 Mod PT Treatments $Gait Training: 8-22 mins        Odessa Morren, PT, GCS 12/16/19,10:02 AM

## 2019-12-16 NOTE — Progress Notes (Signed)
Orthopaedic Trauma Progress Note  S: Doing fairly well this morning. Pain manageable. Hasn't been out of bed yet. Patient lives at home alone, doesn't have anyone that could stay with her at discharge but does have people that could stop by.  O:  Vitals:   12/15/19 1950 12/16/19 0246  BP: (!) 113/53 127/61  Pulse: 75 (!) 59  Resp: 14 17  Temp: 98.7 F (37.1 C)   SpO2: 96% 97%    General: Siting up in bed, NAD Respiratory:  No increased work of breathing.  Left lower extremity: Dressing over lateral hip clean, dry, intact. Tender over posterior lateral hip, otherwise non-tender throughout extremity. Tolerates knee motion to about 60 degrees. Ankle dorsiflexion/plantarflexion intact. Endorses sensation to light touch. Neurovascularly intact  Imaging: Stable post op imaging.   Labs:  Results for orders placed or performed during the hospital encounter of 12/14/19 (from the past 24 hour(s))  VITAMIN D 25 Hydroxy (Vit-D Deficiency, Fractures)     Status: Abnormal   Collection Time: 12/15/19  1:16 PM  Result Value Ref Range   Vit D, 25-Hydroxy 118.26 (H) 30 - 100 ng/mL  CBC     Status: None   Collection Time: 12/16/19  4:04 AM  Result Value Ref Range   WBC 5.7 4.0 - 10.5 K/uL   RBC 3.91 3.87 - 5.11 MIL/uL   Hemoglobin 12.5 12.0 - 15.0 g/dL   HCT 36.7 36 - 46 %   MCV 93.9 80.0 - 100.0 fL   MCH 32.0 26.0 - 34.0 pg   MCHC 34.1 30.0 - 36.0 g/dL   RDW 12.4 11.5 - 15.5 %   Platelets 164 150 - 400 K/uL   nRBC 0.0 0.0 - 0.2 %  Basic metabolic panel     Status: Abnormal   Collection Time: 12/16/19  4:04 AM  Result Value Ref Range   Sodium 132 (L) 135 - 145 mmol/L   Potassium 3.6 3.5 - 5.1 mmol/L   Chloride 103 98 - 111 mmol/L   CO2 22 22 - 32 mmol/L   Glucose, Bld 112 (H) 70 - 99 mg/dL   BUN 16 8 - 23 mg/dL   Creatinine, Ser 0.78 0.44 - 1.00 mg/dL   Calcium 9.6 8.9 - 10.3 mg/dL   GFR calc non Af Amer >60 >60 mL/min   GFR calc Af Amer >60 >60 mL/min   Anion gap 7 5 - 15   Magnesium     Status: None   Collection Time: 12/16/19  4:04 AM  Result Value Ref Range   Magnesium 2.1 1.7 - 2.4 mg/dL    Assessment: 86 year old female status post fall, 1 Day Post-Op   Injuries: Left femoral neck fracture s/p percutaneous fixation  Weightbearing: WBAT LLE  Insicional and dressing care: OK to remove dressings 12/17/2019 and leave open to air with dry gauze PRN   Showering: Okay to begin showering 12/17/2019  Orthopedic device(s): None   CV/Blood loss: Hgb 12.5 this morning. Hemodynamically stable  Pain management:  1. Tylenol 325-650 mg q 6 hours PRN 2. Oxycodone 5 mg q 4 hours PRN 3. Norco 5-325 mg q 6 hours PRN 4. Morphine 1-2 mg q 2 hours PRN  VTE prophylaxis: Lovenox while in the hospital.  Plan to discharge on aspirin 325 mg twice daily  SCDs: in place  ID:  Ancef 2gm post op  Foley/Lines:  No foley, KVO IVFs  Medical co-morbidities: Sjogren's, lupus, hypertension, hyperlipidemia, and chronic constipation  Impediments to Fracture Healing: Vitamin D  level 118, takes D3 supplementation at home  Dispo: PT/OT evaluation today, dispo pending. Will likely need SNF  Plan to remove dressing tomorrow.   Follow - up plan: 2 weeks  Contact information:  Katha Hamming MD, Patrecia Pace PA-C   [facility] [phone] (office) [email or web address]

## 2019-12-16 NOTE — Discharge Instructions (Signed)
[facility] Discharge Instructions   General Discharge Instructions  WEIGHT BEARING STATUS: Weightbearing as tolerated left leg  RANGE OF MOTION/ACTIVITY: Unrestricted range of motion  Wound Care: Incisions can be left open to air if there is no drainage. If incision continues to have drainage, follow wound care instructions below. Okay to shower if no drainage from incisions.  DVT/PE prophylaxis: Aspirin 325 mg twice daily  Diet: as you were eating previously.  Can use over the counter stool softeners and bowel preparations, such as Miralax, to help with bowel movements.  Narcotics can be constipating.  Be sure to drink plenty of fluids  PAIN MEDICATION USE AND EXPECTATIONS  You have likely been given narcotic medications to help control your pain.  After a traumatic event that results in an fracture (broken bone) with or without surgery, it is ok to use narcotic pain medications to help control one's pain.  We understand that everyone responds to pain differently and each individual patient will be evaluated on a regular basis for the continued need for narcotic medications. Ideally, narcotic medication use should last no more than 6-8 weeks (coinciding with fracture healing).   As a patient it is your responsibility as well to monitor narcotic medication use and report the amount and frequency you use these medications when you come to your office visit.   We would also advise that if you are using narcotic medications, you should take a dose prior to therapy to maximize you participation.  IF YOU ARE ON NARCOTIC MEDICATIONS IT IS NOT PERMISSIBLE TO OPERATE A MOTOR VEHICLE (MOTORCYCLE/CAR/TRUCK/MOPED) OR HEAVY MACHINERY DO NOT MIX NARCOTICS WITH OTHER CNS (CENTRAL NERVOUS SYSTEM) DEPRESSANTS SUCH AS ALCOHOL   STOP SMOKING OR USING NICOTINE PRODUCTS!!!!  As discussed nicotine severely impairs your body's ability to heal surgical and traumatic wounds but also impairs bone  healing.  Wounds and bone heal by forming microscopic blood vessels (angiogenesis) and nicotine is a vasoconstrictor (essentially, shrinks blood vessels).  Therefore, if vasoconstriction occurs to these microscopic blood vessels they essentially disappear and are unable to deliver necessary nutrients to the healing tissue.  This is one modifiable factor that you can do to dramatically increase your chances of healing your injury.    (This means no smoking, no nicotine gum, patches, etc)  DO NOT USE NONSTEROIDAL ANTI-INFLAMMATORY DRUGS (NSAID'S)  Using products such as Advil (ibuprofen), Aleve (naproxen), Motrin (ibuprofen) for additional pain control during fracture healing can delay and/or prevent the healing response.  If you would like to take over the counter (OTC) medication, Tylenol (acetaminophen) is ok.  However, some narcotic medications that are given for pain control contain acetaminophen as well. Therefore, you should not exceed more than 4000 mg of tylenol in a day if you do not have liver disease.  Also note that there are may OTC medicines, such as cold medicines and allergy medicines that my contain tylenol as well.  If you have any questions about medications and/or interactions please ask your doctor/PA or your pharmacist.      ICE AND ELEVATE INJURED/OPERATIVE EXTREMITY  Using ice and elevating the injured extremity above your heart can help with swelling and pain control.  Icing in a pulsatile fashion, such as 20 minutes on and 20 minutes off, can be followed.    Do not place ice directly on skin. Make sure there is a barrier between to skin and the ice pack.    Using frozen items such as frozen peas works well  as the conform nicely to the are that needs to be iced.  USE AN ACE WRAP OR TED HOSE FOR SWELLING CONTROL  In addition to icing and elevation, Ace wraps or TED hose are used to help limit and resolve swelling.  It is recommended to use Ace wraps or TED hose until you are  informed to stop.    When using Ace Wraps start the wrapping distally (farthest away from the body) and wrap proximally (closer to the body)   Example: If you had surgery on your leg or thing and you do not have a splint on, start the ace wrap at the toes and work your way up to the thigh        If you had surgery on your upper extremity and do not have a splint on, start the ace wrap at your fingers and work your way up to the upper arm  [place]: [phone]   VISIT OUR WEBSITE FOR ADDITIONAL INFORMATION: [email or web address]     Discharge Wound Care Instructions  Do NOT apply any ointments, solutions or lotions to pin sites or surgical wounds.  These prevent needed drainage and even though solutions like hydrogen peroxide kill bacteria, they also damage cells lining the pin sites that help fight infection.  Applying lotions or ointments can keep the wounds moist and can cause them to breakdown and open up as well. This can increase the risk for infection. When in doubt call the office.  Surgical incisions should be dressed daily.  If any drainage is noted, use one layer of adaptic, then gauze, Kerlix, and an ace wrap.  Once the incision is completely dry and without drainage, it may be left open to air out.  Showering may begin 36-48 hours later.  Cleaning gently with soap and water.  Traumatic wounds should be dressed daily as well.    One layer of adaptic, gauze, Kerlix, then ace wrap.  The adaptic can be discontinued once the draining has ceased    If you have a wet to dry dressing: wet the gauze with saline the squeeze as much saline out so the gauze is moist (not soaking wet), place moistened gauze over wound, then place a dry gauze over the moist one, followed by Kerlix wrap, then ace wrap.

## 2019-12-16 NOTE — Evaluation (Signed)
Occupational Therapy Evaluation Patient Details Name: Peggy Ramirez MRN: [id number] DOB: 1933/10/29 Today's Date: 12/16/2019    History of Present Illness Patient is an 86 year old female admitted after falling from bed. Found to have L hip fracture and is s/p ORIF. PMH includes: HTN, hypokalemia, Sjogrens, lupus, CVA, HAs, renal disease.   Clinical Impression   Pt was independent prior to admission and living alone. Demonstrated ability to stand and ambulate with RW with min guard assist. She requires up to mod assist for ADL. Pt likely to progress well to be able to return home with 24 hour care initially. Pt is unsure her family can provide adequate assistance. Daughter observed session and agreed that pt is doing well POD1. Will follow acutely.    Follow Up Recommendations  SNF;Supervision/Assistance - 24 hour (HHOT if family can provide 24 hour care)    Equipment Recommendations  3 in 1 bedside commode    Recommendations for Other Services       Precautions / Restrictions Precautions Precautions: Fall Restrictions Weight Bearing Restrictions: No LLE Weight Bearing: Weight bearing as tolerated      Mobility Bed Mobility Overal bed mobility: Needs Assistance Bed Mobility: Supine to Sit     Supine to sit: Min assist     General bed mobility comments: received in chair  Transfers Overall transfer level: Needs assistance Equipment used: Rolling walker (2 wheeled) Transfers: Sit to/from Stand Sit to Stand: Min guard         General transfer comment: cues for hand placement, increased time from recliner and from [place] Overall balance assessment: Needs assistance Sitting-balance support: Feet supported Sitting balance-Leahy Scale: Good     Standing balance support: Bilateral upper extremity supported;During functional activity Standing balance-Leahy Scale: Poor Standing balance comment: min guard for static standing, reliant on RW for ambulation                            ADL either performed or assessed with clinical judgement   ADL Overall ADL's : Needs assistance/impaired Eating/Feeding: Independent   Grooming: Min guard;Standing;Wash/dry hands   Upper Body Bathing: Set up;Sitting   Lower Body Bathing: Moderate assistance;Sit to/from stand   Upper Body Dressing : Set up;Sitting   Lower Body Dressing: Moderate assistance;Sit to/from stand   Toilet Transfer: Min guard;Ambulation;RW   Toileting- Clothing Manipulation and Hygiene: Minimal assistance;Sit to/from stand       Functional mobility during ADLs: Min guard;Rolling walker       Vision Patient Visual Report: No change from baseline       Perception     Praxis      Pertinent Vitals/Pain Pain Assessment: Faces Faces Pain Scale: Hurts little more Pain Location: L hip Pain Descriptors / Indicators: Discomfort;Sore Pain Intervention(s): Monitored during session;Repositioned     Hand Dominance Right   Extremity/Trunk Assessment Upper Extremity Assessment Upper Extremity Assessment: Overall WFL for tasks assessed (arthritic changes in hands)   Lower Extremity Assessment Lower Extremity Assessment: Defer to PT evaluation   Cervical / Trunk Assessment Cervical / Trunk Assessment: Normal   Communication Communication Communication: HOH   Cognition Arousal/Alertness: Awake/alert Behavior During Therapy: WFL for tasks assessed/performed Overall Cognitive Status: Within Functional Limits for tasks assessed  General Comments       Exercises    Shoulder Instructions      Home Living Family/patient expects to be discharged to:: Private residence Living Arrangements: Alone Available Help at Discharge: Family;Friend(s);Available PRN/intermittently Type of Home: House Home Access: Stairs to enter [employer] of Steps: 1   Home Layout: One level     Bathroom Shower/Tub:  Teacher, early years/pre: Standard     Home Equipment: Environmental consultant - 2 wheels          Prior Functioning/Environment Level of Independence: Independent                 OT Problem List: Decreased strength;Decreased activity tolerance;Impaired balance (sitting and/or standing);Decreased knowledge of use of DME or AE;Pain      OT Treatment/Interventions: Self-care/ADL training;DME and/or AE instruction;Patient/family education;Balance training;Therapeutic activities    OT Goals(Current goals can be found in the care plan section) Acute Rehab OT Goals Patient Stated Goal: to get back to independence OT Goal Formulation: With patient Time For Goal Achievement: 12/30/19 Potential to Achieve Goals: Good ADL Goals Pt Will Perform Grooming: with modified independence;standing Pt Will Perform Lower Body Bathing: with modified independence;with adaptive equipment;sit to/from stand Pt Will Perform Lower Body Dressing: with modified independence;sit to/from stand;with adaptive equipment Pt Will Transfer to Toilet: with modified independence;ambulating;bedside commode (over toilet) Pt Will Perform Toileting - Clothing Manipulation and hygiene: with modified independence;sit to/from stand  OT Frequency: Min 2X/week   Barriers to D/C:            Co-evaluation              AM-PAC OT "6 Clicks" Daily Activity     Outcome Measure Help from another person eating meals?: None Help from another person taking care of personal grooming?: A Little Help from another person toileting, which includes using toliet, bedpan, or urinal?: A Little Help from another person bathing (including washing, rinsing, drying)?: A Lot Help from another person to put on and taking off regular upper body clothing?: None Help from another person to put on and taking off regular lower body clothing?: A Lot 6 Click Score: 18   End of Session Equipment Utilized During Treatment: Gait belt;Rolling  walker  Activity Tolerance: Patient tolerated treatment well Patient left: in chair;with call bell/phone within reach;with family/visitor present;with nursing/sitter in room;with chair alarm set  OT Visit Diagnosis: Unsteadiness on feet (R26.81);Other abnormalities of gait and mobility (R26.89);Pain                Time: 6283-6629 OT Time Calculation (min): 19 min Charges:  OT General Charges $OT Visit: 1 Visit OT Evaluation $OT Eval Moderate Complexity: 1 Mod  {Camreigh Michie, Haze Boyden 12/16/2019, 12:39 PM  Nestor Lewandowsky, OTR/L Acute Rehabilitation Services Pager: [phone] Office: [phone]

## 2019-12-17 DIAGNOSIS — T148XXA Other injury of unspecified body region, initial encounter: Secondary | ICD-10-CM

## 2019-12-17 DIAGNOSIS — W19XXXA Unspecified fall, initial encounter: Secondary | ICD-10-CM

## 2019-12-17 LAB — CBC
HCT: 36.7 % (ref 36.0–46.0)
Hemoglobin: 12.4 g/dL (ref 12.0–15.0)
MCH: 32.9 pg (ref 26.0–34.0)
MCHC: 33.8 g/dL (ref 30.0–36.0)
MCV: 97.3 fL (ref 80.0–100.0)
Platelets: 188 10*3/uL (ref 150–400)
RBC: 3.77 MIL/uL — ABNORMAL LOW (ref 3.87–5.11)
RDW: 12.6 % (ref 11.5–15.5)
WBC: 6.4 10*3/uL (ref 4.0–10.5)
nRBC: 0 % (ref 0.0–0.2)

## 2019-12-17 LAB — MAGNESIUM: Magnesium: 1.9 mg/dL (ref 1.7–2.4)

## 2019-12-17 MED ORDER — 3-IN-1 BEDSIDE TOILET MISC: 1 refills | Status: DC

## 2019-12-17 NOTE — Plan of Care (Signed)
  Problem: Activity: Goal: Risk for activity intolerance will decrease Outcome: Progressing   Problem: Elimination: Goal: Will not experience complications related to bowel motility Outcome: Progressing   Problem: Pain Managment: Goal: General experience of comfort will improve Outcome: Progressing   

## 2019-12-17 NOTE — Progress Notes (Signed)
Occupational Therapy Treatment Patient Details Name: Peggy Ramirez MRN: [id number] DOB: [birth year] Today's Date: 12/17/2019    History of present illness Patient is an 86 year old female admitted after falling from bed. Found to have L hip fracture and is s/p ORIF. PMH includes: HTN, hypokalemia, Sjogrens, lupus, CVA, HAs, renal disease.   OT comments  Pt admitted with see above. Pt currently with functional limitations due to the deficits listed below (see OT Problem List). Pt at this time plan to go home with family alternating to assist pt with 24/7 care. Pt's daughter Lynelle Smoke was present in the session as now going home with Home Health was educated about the use of adaptive devices for LE dressing and bathing at this time to decrease in care giver burden. Pt completed supine to sitting with min assist with HOB elevated and bed rail, sit to stand from elevated surface with min guard and cues on hand position, ambulation with RW with min guard and cueing on positioning, toilet tasks with grab bars and min guard. Pt at this time recommended to have bed rail, sock aide, elevated surface, long handle reacher, long handle sponge and hand held shower   Pt will benefit from skilled OT to increase their safety and independence with ADL and functional mobility for ADL to facilitate discharge to venue listed below.     Follow Up Recommendations  Home health OT;Supervision/Assistance - 24 hour    Equipment Recommendations  3 in 1 bedside commode;Other (comment) (Rw)    Recommendations for Other Services      Precautions / Restrictions Precautions Precautions: Fall Restrictions Weight Bearing Restrictions: Yes LLE Weight Bearing: Weight bearing as tolerated       Mobility Bed Mobility Overal bed mobility: Needs Assistance Bed Mobility: Supine to Sit     Supine to sit: Min assist        Transfers Overall transfer level: Needs assistance Equipment used: Rolling walker (2  wheeled) Transfers: Sit to/from Stand Sit to Stand: Min guard         General transfer comment: cues for hand placement, increased time from recliner and from [place] Overall balance assessment: Needs assistance Sitting-balance support: Feet supported Sitting balance-Leahy Scale: Good     Standing balance support: Bilateral upper extremity supported;During functional activity Standing balance-Leahy Scale: Poor Standing balance comment: min guard for static standing, reliant on RW for ambulation                           ADL either performed or assessed with clinical judgement   ADL Overall ADL's : Needs assistance/impaired Eating/Feeding: Independent   Grooming: Min guard;Standing;Wash/dry hands   Upper Body Bathing: Set up;Sitting   Lower Body Bathing: Moderate assistance;Sit to/from stand   Upper Body Dressing : Set up;Sitting   Lower Body Dressing: Moderate assistance;Sit to/from stand   Toilet Transfer: Min guard;Ambulation;RW   Toileting- Clothing Manipulation and Hygiene: Minimal assistance;Sit to/from stand   Tub/ Banker: Walk-in shower;Moderate assistance   Functional mobility during ADLs: Min guard;Rolling walker       Vision   Vision Assessment?: No apparent visual deficits   Perception     Praxis      Cognition Arousal/Alertness: Awake/alert Behavior During Therapy: WFL for tasks assessed/performed Overall Cognitive Status: Within Functional Limits for tasks assessed  Exercises     Shoulder Instructions       General Comments      Pertinent Vitals/ Pain       Pain Assessment: Faces Faces Pain Scale: Hurts little more Pain Location: L hip Pain Descriptors / Indicators: Discomfort;Sore Pain Intervention(s): Limited activity within patient's tolerance;Repositioned  Home Living Family/patient expects to be discharged to:: Private residence Living  Arrangements: Alone                                      Prior Functioning/Environment              Frequency  Min 2X/week        Progress Toward Goals  OT Goals(current goals can now be found in the care plan section)  Progress towards OT goals: Progressing toward goals  Acute Rehab OT Goals Patient Stated Goal: to get back to independence OT Goal Formulation: With patient Time For Goal Achievement: 12/30/19 Potential to Achieve Goals: Good ADL Goals Pt Will Perform Grooming: with modified independence;standing Pt Will Perform Lower Body Bathing: with modified independence;with adaptive equipment;sit to/from stand Pt Will Perform Lower Body Dressing: with modified independence;sit to/from stand;with adaptive equipment Pt Will Transfer to Toilet: with modified independence;ambulating;bedside commode Pt Will Perform Toileting - Clothing Manipulation and hygiene: with modified independence;sit to/from stand  Plan Discharge plan remains appropriate    Co-evaluation                 AM-PAC OT "6 Clicks" Daily Activity     Outcome Measure   Help from another person eating meals?: None Help from another person taking care of personal grooming?: A Little Help from another person toileting, which includes using toliet, bedpan, or urinal?: A Little Help from another person bathing (including washing, rinsing, drying)?: A Lot Help from another person to put on and taking off regular upper body clothing?: None Help from another person to put on and taking off regular lower body clothing?: A Lot 6 Click Score: 18    End of Session Equipment Utilized During Treatment: Gait belt;Rolling walker  OT Visit Diagnosis: Unsteadiness on feet (R26.81);Other abnormalities of gait and mobility (R26.89);Pain Pain - Right/Left: Left Pain - part of body: Leg   Activity Tolerance Patient tolerated treatment well   Patient Left in chair;with call bell/phone within  reach;with family/visitor present;with nursing/sitter in room;with chair alarm set   Nurse Communication Other (comment) (need for walker as borrowing walker)        Time: 1127-1209 OT Time Calculation (min): 42 min  Charges: OT General Charges $OT Visit: 1 Visit OT Treatments $Self Care/Home Management : 38-52 mins  Joeseph Amor OTR/L  [place]  [phone] office number [phone] pager number    Joeseph Amor 12/17/2019, 12:16 PM

## 2019-12-17 NOTE — Progress Notes (Signed)
Pt given discharge instructions and gone over with her. She verbalized understanding. All belongings gathered to be sent home. 3N1 and walker given to pt from [employer] supply. Pt's daughter transporting her home.

## 2019-12-17 NOTE — TOC Transition Note (Signed)
Transition of Care [facility]) - CM/SW Discharge Note   Patient Details  Name: Peggy Ramirez MRN: [id number] Date of Birth: 03-07-1934  Transition of Care [facility]) CM/SW Contact:  Claudie Leach, RN 12/17/2019, 12:21 PM   Clinical Narrative:   Patient to dc home with daughter and [facility] PT. Tommi Rumps with [facility] accepted referral. Order placed with [employer] for 3 in 1 and rolling walker. RN will deliver from 5N floor stock. [employer] is aware of order.     Final next level of care: [place] Barriers to Discharge: No Barriers Identified   Patient Goals and CMS Choice Patient states their goals for this hospitalization and ongoing recovery are:: to go home CMS Medicare.gov Compare Post Acute Care list provided to:: Patient Choice offered to / list presented to : Patient   Discharge Plan and Services                DME Arranged: 3-N-1, Gilford Rile DME Agency: [employer] Date DME Agency Contacted: 12/17/19 Time DME Agency Contacted: 1220 Representative spoke with at DME Agency: [place] Arranged: PT [place]: [place] Date [place]: 12/17/19 Time [place]: 1221 Representative spoke with at [place]: Tommi Rumps

## 2019-12-17 NOTE — Discharge Summary (Signed)
Peggy Ramirez:[id number] DOB: [birth year] DOA: 12/14/2019  PCP: Manon Hilding, MD  Admit date: 12/14/2019 Discharge date: 12/17/2019  Admitted From: Home Disposition:  Home  Recommendations for Outpatient Follow-up:  1. Follow up with PCP in 1-2 weeks 2. Follow up with ortho arranged in 2 weeks 3. Short script for pain control on discharge 4. Apsiring 325 mg BID for DVT ppx per ortho 5.   Home Health:PT Equipment/Devices:bedside commode and walker Discharge Condition:STABLE  CODE STATUS:FULL    Brief/Interim Summary: History of present illness:  Peggy Ramirez is a 86 y.o. year old female with medical history significant for Sjogren's, lupus, hypertension, hyperlipidemia, and chronic constipation who presented on 12/14/2019 withPersistent left hip pain after missing a step while getting out of bed about LOC or head trauma and was found to have subcapital fracture left femoral neck on x-ray.  Patient percutaneous fixation of left femoral neck fracture on 8/19 by Dr. Doreatha Martin   Remaining hospital course addressed in problem based format below:   Hospital Course:   Left impacted femoral neck fracture after fall, status post percutaneous fixation of left femoral neck fracture on 8/19 by Dr. Doreatha Martin. -Weightbearing as tolerated to left lower extremity per Ortho -DVT ppx 325 mg twice daily per orthpoedics -PT/OT recommend SNF versus home health PT, family agreeable to PT and will provide 24 H assistance on discharge  -Pain control with as needed IV morphine for severe pain every 2 hours as needed, OxyIR 5 mg every 4 hours as needed moderate pain, Tylenol as needed every 6 hours mild pain -Follow-up with Dr. Doreatha Martin in 2 weeks, Ortho to remove dressing on 8/21  Mechanical fall.  No preceding symptoms, no head trauma. -HHPT provided on discharge with rolling walker  Reported urinary retention.  UA unremarkable.  Foley catheter inserted prior to the OR. Did well with voiding  trial  Chronic constipation, stable -Continue home MiraLAX daily  HTN, at goal -Continue home HCTZ 12.5 mg daily  HLD -Has not tolerated atorvastatin in the past (muscle aches)  Prior CVA (09/2015).  No focal deficits on exam -Held home aspirin prior to the OR on admission -On discharge for DVT prophylaxis Aspirin 325 mg twice daily  GERD, stable -Continue home Protonix  Hypokalemia, chronic. In setting of HCTZ -corrected potassium in hospital, K of 3.6 on discharge     Consultations:  Orthopedics  Procedures/Studies: Femoral neck fracture repair by Dr. Doreatha Martin on 8/19 Subjective: Feels well. Sitting up in chair Discharge Exam: Vitals:   12/17/19 0300 12/17/19 0805  BP: 116/66 120/62  Pulse: 71 68  Resp: 16 17  Temp: 98.2 F (36.8 C) 98.6 F (37 C)  SpO2: 96% 97%   Vitals:   12/16/19 1925 12/17/19 0300 12/17/19 0417 12/17/19 0805  BP: 119/62 116/66  120/62  Pulse: 73 71  68  Resp: 15 16  17   Temp: 98 F (36.7 C) 98.2 F (36.8 C)  98.6 F (37 C)  TempSrc: Oral Axillary  Oral  SpO2: 97% 96%  97%  Weight:   50.5 kg   Height:        Awake Alert, Oriented X 3, Normal affect No new F.N deficits,  [place].AT, Normal respiratory effort on room air, CTAB RRR,No Gallops,Rubs or new Murmurs,  +ve B.Sounds, Abd Soft, No tenderness, No rebound, guarding or rigidity. No Cyanosis, No new Rash or bruise Dressing in place c/d/i on left hip, dorsiflexion intact bilaterally. Sensatio in tact  Discharge Diagnoses:  Active Problems:  Essential hypertension   Closed left hip fracture (HCC)   Hypokalemia   Chronic constipation   Acute urinary retention    Discharge Instructions   Allergies as of 12/17/2019      Reactions   Atorvastatin Other (See Comments)   Muscle aches   Cellcept [mycophenolate Mofetil] Other (See Comments)   unknown   Medrol [methylprednisolone] Other (See Comments)   unknown   Mycophenolate Mofetil Other (See Comments)   unknown    Penicillins Itching   Sulfonamide Derivatives Nausea Only   Topiramate Other (See Comments)   unknown   Verapamil Other (See Comments)   Felt shakey   Levofloxacin Rash      Medication List    TAKE these medications   3-in-1 Bedside Toilet Misc Patient needs 3 in 1 bedside toilet. Recent hip fracture with repair   aspirin 325 MG EC tablet Take 1 tablet (325 mg total) by mouth in the morning and at bedtime. What changed:   medication strength  how much to take  when to take this   hydrochlorothiazide 12.5 MG capsule Commonly known as: MICROZIDE TAKE 1 CAPSULE BY MOUTH ONCE DAILY PATIENT  NEEDS  APPOINTMENT  FOR  FURTHER  REFILLS What changed: See the new instructions.   HYDROcodone-acetaminophen 5-325 MG tablet Commonly known as: NORCO/VICODIN Take 1 tablet by mouth every 6 (six) hours as needed for severe pain.   loratadine 10 MG tablet Commonly known as: CLARITIN Take 10 mg by mouth daily.   MiraLax 17 GM/SCOOP powder Generic drug: polyethylene glycol powder Take 17 g by mouth daily.   multivitamin tablet Take 1 tablet by mouth daily.   ondansetron 4 MG tablet Commonly known as: ZOFRAN Take 1 tablet (4 mg total) by mouth every 6 (six) hours as needed for nausea or vomiting.   pantoprazole 40 MG tablet Commonly known as: PROTONIX Take 40 mg by mouth 2 (two) times daily.   potassium chloride 10 MEQ tablet Commonly known as: KLOR-CON Take 10 mEq by mouth daily.   triamcinolone cream 0.1 % Commonly known as: KENALOG Apply 1 application topically 3 (three) times daily.   vitamin C 1000 MG tablet Take 1,000 mg by mouth daily.   VITAMIN D-3 PO Take 1 tablet by mouth daily.   Vitamin E 400 units Tabs Take 400 Units by mouth daily.   zinc gluconate 50 MG tablet Take 50 mg by mouth daily.            Durable Medical Equipment  (From admission, onward)         Start     Ordered   12/17/19 0911  For home use only DME Bedside commode  Once        Question:  Patient needs a bedside commode to treat with the following condition  Answer:  Hip fracture ([place])   12/17/19 0910          Follow-up Information    Haddix, Thomasene Lot, MD. Schedule an appointment as soon as possible for a visit in 2 week(s).   Specialty: Orthopedic Surgery Why: repeat x-rays, wound check Contact information: [place] [address] [phone]              Allergies  Allergen Reactions  . Atorvastatin Other (See Comments)    Muscle aches  . Cellcept [Mycophenolate Mofetil] Other (See Comments)    unknown  . Medrol [Methylprednisolone] Other (See Comments)    unknown  . Mycophenolate Mofetil Other (See Comments)  unknown  . Penicillins Itching  . Sulfonamide Derivatives Nausea Only  . Topiramate Other (See Comments)    unknown  . Verapamil Other (See Comments)    Felt shakey  . Levofloxacin Rash        The results of significant diagnostics from this hospitalization (including imaging, microbiology, ancillary and laboratory) are listed below for reference.     Microbiology: Recent Results (from the past 240 hour(s))  SARS Coronavirus 2 by RT PCR (hospital order, performed in [facility] hospital lab) Nasopharyngeal Nasopharyngeal Swab     Status: None   Collection Time: 12/14/19  5:01 PM   Specimen: Nasopharyngeal Swab  Result Value Ref Range Status   SARS Coronavirus 2 NEGATIVE NEGATIVE Final    Comment: (NOTE) SARS-CoV-2 target nucleic acids are NOT DETECTED.  The SARS-CoV-2 RNA is generally detectable in upper and lower respiratory specimens during the acute phase of infection. The lowest concentration of SARS-CoV-2 viral copies this assay can detect is 250 copies / mL. A negative result does not preclude SARS-CoV-2 infection and should not be used as the sole basis for treatment or other patient management decisions.  A negative result may occur with improper specimen collection / handling, submission of  specimen other than nasopharyngeal swab, presence of viral mutation(s) within the areas targeted by this assay, and inadequate number of viral copies (<250 copies / mL). A negative result must be combined with clinical observations, patient history, and epidemiological information.  Fact Sheet for Patients:   StrictlyIdeas.no  Fact Sheet for Healthcare Providers: BankingDealers.co.za  This test is not yet approved or  cleared by the [place] FDA and has been authorized for detection and/or diagnosis of SARS-CoV-2 by FDA under an Emergency Use Authorization (EUA).  This EUA will remain in effect (meaning this test can be used) for the duration of the COVID-19 declaration under Section 564(b)(1) of the Act, 21 U.S.C. section 360bbb-3(b)(1), unless the authorization is terminated or revoked sooner.  Performed at [facility], [address] [place], [place] [address]   MRSA PCR Screening     Status: None   Collection Time: 12/15/19  1:00 AM   Specimen: Nasopharyngeal  Result Value Ref Range Status   MRSA by PCR NEGATIVE NEGATIVE Final    Comment:        The GeneXpert MRSA Assay (FDA approved for NASAL specimens only), is one component of a comprehensive MRSA colonization surveillance program. It is not intended to diagnose MRSA infection nor to guide or monitor treatment for MRSA infections. Performed at [facility], [place] [address], [place], [place] [address]      Labs: BNP (last 3 results) No results for input(s): BNP in the last 8760 hours. Basic Metabolic Panel: Recent Labs  Lab 12/14/19 1657 12/15/19 0319 12/16/19 0404 12/17/19 0341  NA 133* 136 132*  --   K 3.6 3.0* 3.6  --   CL 97* 103 103  --   CO2 25 25 22   --   GLUCOSE 113* 98 112*  --   BUN 22 12 16   --   CREATININE 0.81 0.69 0.78  --   CALCIUM 10.4* 9.8 9.6  --   MG  --  2.1 2.1 1.9   Liver Function Tests: Recent Labs  Lab 12/15/19 0319   AST 22  ALT 20  ALKPHOS 49  BILITOT 1.0  PROT 5.9*  ALBUMIN 3.1*   No results for input(s): LIPASE, AMYLASE in the last 168 hours. No results for  input(s): AMMONIA in the last 168 hours. CBC: Recent Labs  Lab 12/14/19 1657 12/15/19 0319 12/16/19 0404 12/17/19 0341  WBC 6.5 4.8 5.7 6.4  NEUTROABS 5.4  --   --   --   HGB 13.4 13.2 12.5 12.4  HCT 39.6 38.3 36.7 36.7  MCV 95.9 93.9 93.9 97.3  PLT 193 174 164 188   Cardiac Enzymes: No results for input(s): CKTOTAL, CKMB, CKMBINDEX, TROPONINI in the last 168 hours. BNP: Invalid input(s): POCBNP CBG: No results for input(s): GLUCAP in the last 168 hours. D-Dimer No results for input(s): DDIMER in the last 72 hours. Hgb A1c No results for input(s): HGBA1C in the last 72 hours. Lipid Profile No results for input(s): CHOL, HDL, LDLCALC, TRIG, CHOLHDL, LDLDIRECT in the last 72 hours. Thyroid function studies No results for input(s): TSH, T4TOTAL, T3FREE, THYROIDAB in the last 72 hours.  Invalid input(s): FREET3 Anemia work up No results for input(s): VITAMINB12, FOLATE, FERRITIN, TIBC, IRON, RETICCTPCT in the last 72 hours. Urinalysis    Component Value Date/Time   COLORURINE STRAW (A) 12/14/2019 2223   APPEARANCEUR CLEAR 12/14/2019 2223   LABSPEC 1.009 12/14/2019 2223   PHURINE 7.0 12/14/2019 2223   GLUCOSEU NEGATIVE 12/14/2019 2223   HGBUR NEGATIVE 12/14/2019 2223   BILIRUBINUR NEGATIVE 12/14/2019 2223   KETONESUR NEGATIVE 12/14/2019 2223   PROTEINUR NEGATIVE 12/14/2019 2223   NITRITE NEGATIVE 12/14/2019 2223   LEUKOCYTESUR NEGATIVE 12/14/2019 2223   Sepsis Labs Invalid input(s): PROCALCITONIN,  WBC,  LACTICIDVEN Microbiology Recent Results (from the past 240 hour(s))  SARS Coronavirus 2 by RT PCR (hospital order, performed in [facility] hospital lab) Nasopharyngeal Nasopharyngeal Swab     Status: None   Collection Time: 12/14/19  5:01 PM   Specimen: Nasopharyngeal Swab  Result Value Ref Range Status   SARS  Coronavirus 2 NEGATIVE NEGATIVE Final    Comment: (NOTE) SARS-CoV-2 target nucleic acids are NOT DETECTED.  The SARS-CoV-2 RNA is generally detectable in upper and lower respiratory specimens during the acute phase of infection. The lowest concentration of SARS-CoV-2 viral copies this assay can detect is 250 copies / mL. A negative result does not preclude SARS-CoV-2 infection and should not be used as the sole basis for treatment or other patient management decisions.  A negative result may occur with improper specimen collection / handling, submission of specimen other than nasopharyngeal swab, presence of viral mutation(s) within the areas targeted by this assay, and inadequate number of viral copies (<250 copies / mL). A negative result must be combined with clinical observations, patient history, and epidemiological information.  Fact Sheet for Patients:   StrictlyIdeas.no  Fact Sheet for Healthcare Providers: BankingDealers.co.za  This test is not yet approved or  cleared by the [place] FDA and has been authorized for detection and/or diagnosis of SARS-CoV-2 by FDA under an Emergency Use Authorization (EUA).  This EUA will remain in effect (meaning this test can be used) for the duration of the COVID-19 declaration under Section 564(b)(1) of the Act, 21 U.S.C. section 360bbb-3(b)(1), unless the authorization is terminated or revoked sooner.  Performed at [facility], [address], [place], [place] [address]   MRSA PCR Screening     Status: None   Collection Time: 12/15/19  1:00 AM   Specimen: Nasopharyngeal  Result Value Ref Range Status   MRSA by PCR NEGATIVE NEGATIVE Final    Comment:        The GeneXpert MRSA Assay (FDA approved for NASAL specimens only), is one  component of a comprehensive MRSA colonization surveillance program. It is not intended to diagnose MRSA infection nor to guide or monitor treatment  for MRSA infections. Performed at [facility], [place] [address] [place], [place] [address]      Time coordinating discharge: Over 30 minutes  SIGNED:   Desiree Hane, MD  [facility] 12/17/2019, 9:13 AM Pager   If 7PM-7AM, please contact night-coverage [email or web address] Password [email or web address]

## 2019-12-17 NOTE — Progress Notes (Signed)
Orthopaedic Trauma Progress Note  S: Doing well this morning. Had some pain in her hip last night which caused her to have difficulty sleeping. Improved with medication.   O:  Vitals:   12/17/19 0300 12/17/19 0805  BP: 116/66 120/62  Pulse: 71 68  Resp: 16 17  Temp: 98.2 F (36.8 C) 98.6 F (37 C)  SpO2: 96% 97%    General: Siting up in bed, NAD Respiratory:  No increased work of breathing.  Left lower extremity: Dressing over lateral hip clean, dry, intact. Dressing removed - incision CDI. Tender over posterior lateral hip, otherwise non-tender throughout extremity.  Ankle dorsiflexion/plantarflexion intact. Endorses sensation to light touch. Neurovascularly intact.  Imaging: Stable post op imaging.   Labs:  Results for orders placed or performed during the hospital encounter of 12/14/19 (from the past 24 hour(s))  CBC     Status: Abnormal   Collection Time: 12/17/19  3:41 AM  Result Value Ref Range   WBC 6.4 4.0 - 10.5 K/uL   RBC 3.77 (L) 3.87 - 5.11 MIL/uL   Hemoglobin 12.4 12.0 - 15.0 g/dL   HCT 36.7 36 - 46 %   MCV 97.3 80.0 - 100.0 fL   MCH 32.9 26.0 - 34.0 pg   MCHC 33.8 30.0 - 36.0 g/dL   RDW 12.6 11.5 - 15.5 %   Platelets 188 150 - 400 K/uL   nRBC 0.0 0.0 - 0.2 %  Magnesium     Status: None   Collection Time: 12/17/19  3:41 AM  Result Value Ref Range   Magnesium 1.9 1.7 - 2.4 mg/dL    Assessment: 86 year old female status post fall, 2 Days Post-Op   Injuries: Left femoral neck fracture s/p percutaneous fixation  Weightbearing: WBAT LLE  Insicional and dressing care: OK to remove dressings 12/17/2019 and leave open to air with dry gauze PRN Dressings removed today.   Showering: Okay to begin showering 12/17/2019  Orthopedic device(s): None   CV/Blood loss: Hgb 12.4 this morning. Hemodynamically stable  Pain management:  1. Tylenol 325-650 mg q 6 hours PRN 2. Oxycodone 5 mg q 4 hours PRN 3. Norco 5-325 mg q 6 hours PRN 4. Morphine 1-2 mg q 2 hours  PRN  VTE prophylaxis: Lovenox while in the hospital.  Plan to discharge on aspirin 325 mg twice daily  SCDs: in place  ID:  Ancef 2gm post op  Foley/Lines:  No foley, KVO IVFs  Medical co-morbidities: Sjogren's, lupus, hypertension, hyperlipidemia, and chronic constipation  Impediments to Fracture Healing: Vitamin D level 118, takes D3 supplementation at home  Dispo: PT/OT evaluation today, dispo pending. Will likely need SNF  Plan to remove dressing tomorrow.   Follow - up plan: 2 weeks  Contact information:  Katha Hamming MD, Patrecia Pace PA-C  Prescriptions for discharge sent electronically to patient's pharmacy. Cleared for discharge from orthopedics standpoint once cleared by medicine team and therapies.   Noemi Chapel, PA-C 12/17/2019

## 2019-12-20 DIAGNOSIS — M19041 Primary osteoarthritis, right hand: Secondary | ICD-10-CM | POA: Diagnosis not present

## 2019-12-20 DIAGNOSIS — I10 Essential (primary) hypertension: Secondary | ICD-10-CM | POA: Diagnosis not present

## 2019-12-20 DIAGNOSIS — S72012D Unspecified intracapsular fracture of left femur, subsequent encounter for closed fracture with routine healing: Secondary | ICD-10-CM | POA: Diagnosis not present

## 2019-12-20 DIAGNOSIS — M19042 Primary osteoarthritis, left hand: Secondary | ICD-10-CM | POA: Diagnosis not present

## 2019-12-20 DIAGNOSIS — E785 Hyperlipidemia, unspecified: Secondary | ICD-10-CM | POA: Diagnosis not present

## 2019-12-21 ENCOUNTER — Other Ambulatory Visit: Payer: Self-pay

## 2019-12-21 NOTE — Patient Outreach (Signed)
[facility]) Care Management  12/21/2019  Peggy Ramirez [birth year] [id number]   Red emmi: Date of red emmi:  12/19/2019 Reason for alert:  Does patient know who to call for changes in condtion-no  12/20/2019  Placed call to patient to review Red emmi:  PT with patient and I was asked to call back.    12/21/2019  Placed 2nd call to patient and spoke with son. Son states that patient is doing well. Reports active with PT. Reports using bedside commode over toilet. Reports follow up planned with primary MD and ortho.  I reviewed discharge orders and son voiced understanding.  No other needs identified.   PLAN: case closure as no needs identified.   Tomasa Rand, RN, BSN, CEN [facility] [employer] [phone]

## 2019-12-23 DIAGNOSIS — E785 Hyperlipidemia, unspecified: Secondary | ICD-10-CM | POA: Diagnosis not present

## 2019-12-23 DIAGNOSIS — M19042 Primary osteoarthritis, left hand: Secondary | ICD-10-CM | POA: Diagnosis not present

## 2019-12-23 DIAGNOSIS — I10 Essential (primary) hypertension: Secondary | ICD-10-CM | POA: Diagnosis not present

## 2019-12-23 DIAGNOSIS — M19041 Primary osteoarthritis, right hand: Secondary | ICD-10-CM | POA: Diagnosis not present

## 2019-12-23 DIAGNOSIS — S72012D Unspecified intracapsular fracture of left femur, subsequent encounter for closed fracture with routine healing: Secondary | ICD-10-CM | POA: Diagnosis not present

## 2019-12-26 DIAGNOSIS — M19041 Primary osteoarthritis, right hand: Secondary | ICD-10-CM | POA: Diagnosis not present

## 2019-12-26 DIAGNOSIS — M19042 Primary osteoarthritis, left hand: Secondary | ICD-10-CM | POA: Diagnosis not present

## 2019-12-26 DIAGNOSIS — I10 Essential (primary) hypertension: Secondary | ICD-10-CM | POA: Diagnosis not present

## 2019-12-26 DIAGNOSIS — E785 Hyperlipidemia, unspecified: Secondary | ICD-10-CM | POA: Diagnosis not present

## 2019-12-26 DIAGNOSIS — S72012D Unspecified intracapsular fracture of left femur, subsequent encounter for closed fracture with routine healing: Secondary | ICD-10-CM | POA: Diagnosis not present

## 2019-12-27 DIAGNOSIS — N183 Chronic kidney disease, stage 3 unspecified: Secondary | ICD-10-CM | POA: Diagnosis not present

## 2019-12-27 DIAGNOSIS — K2100 Gastro-esophageal reflux disease with esophagitis, without bleeding: Secondary | ICD-10-CM | POA: Diagnosis not present

## 2019-12-27 DIAGNOSIS — E7849 Other hyperlipidemia: Secondary | ICD-10-CM | POA: Diagnosis not present

## 2019-12-27 DIAGNOSIS — I129 Hypertensive chronic kidney disease with stage 1 through stage 4 chronic kidney disease, or unspecified chronic kidney disease: Secondary | ICD-10-CM | POA: Diagnosis not present

## 2019-12-28 DIAGNOSIS — E785 Hyperlipidemia, unspecified: Secondary | ICD-10-CM | POA: Diagnosis not present

## 2019-12-28 DIAGNOSIS — I10 Essential (primary) hypertension: Secondary | ICD-10-CM | POA: Diagnosis not present

## 2019-12-28 DIAGNOSIS — S72012D Unspecified intracapsular fracture of left femur, subsequent encounter for closed fracture with routine healing: Secondary | ICD-10-CM | POA: Diagnosis not present

## 2019-12-28 DIAGNOSIS — M19041 Primary osteoarthritis, right hand: Secondary | ICD-10-CM | POA: Diagnosis not present

## 2019-12-28 DIAGNOSIS — M19042 Primary osteoarthritis, left hand: Secondary | ICD-10-CM | POA: Diagnosis not present

## 2020-01-03 DIAGNOSIS — E785 Hyperlipidemia, unspecified: Secondary | ICD-10-CM | POA: Diagnosis not present

## 2020-01-03 DIAGNOSIS — M19041 Primary osteoarthritis, right hand: Secondary | ICD-10-CM | POA: Diagnosis not present

## 2020-01-03 DIAGNOSIS — S72012D Unspecified intracapsular fracture of left femur, subsequent encounter for closed fracture with routine healing: Secondary | ICD-10-CM | POA: Diagnosis not present

## 2020-01-03 DIAGNOSIS — M19042 Primary osteoarthritis, left hand: Secondary | ICD-10-CM | POA: Diagnosis not present

## 2020-01-03 DIAGNOSIS — I10 Essential (primary) hypertension: Secondary | ICD-10-CM | POA: Diagnosis not present

## 2020-01-05 DIAGNOSIS — I10 Essential (primary) hypertension: Secondary | ICD-10-CM | POA: Diagnosis not present

## 2020-01-05 DIAGNOSIS — M19041 Primary osteoarthritis, right hand: Secondary | ICD-10-CM | POA: Diagnosis not present

## 2020-01-05 DIAGNOSIS — E785 Hyperlipidemia, unspecified: Secondary | ICD-10-CM | POA: Diagnosis not present

## 2020-01-05 DIAGNOSIS — S72012D Unspecified intracapsular fracture of left femur, subsequent encounter for closed fracture with routine healing: Secondary | ICD-10-CM | POA: Diagnosis not present

## 2020-01-05 DIAGNOSIS — M19042 Primary osteoarthritis, left hand: Secondary | ICD-10-CM | POA: Diagnosis not present

## 2020-01-09 DIAGNOSIS — E785 Hyperlipidemia, unspecified: Secondary | ICD-10-CM | POA: Diagnosis not present

## 2020-01-09 DIAGNOSIS — M19041 Primary osteoarthritis, right hand: Secondary | ICD-10-CM | POA: Diagnosis not present

## 2020-01-09 DIAGNOSIS — S72012D Unspecified intracapsular fracture of left femur, subsequent encounter for closed fracture with routine healing: Secondary | ICD-10-CM | POA: Diagnosis not present

## 2020-01-09 DIAGNOSIS — M19042 Primary osteoarthritis, left hand: Secondary | ICD-10-CM | POA: Diagnosis not present

## 2020-01-09 DIAGNOSIS — I10 Essential (primary) hypertension: Secondary | ICD-10-CM | POA: Diagnosis not present

## 2020-01-10 DIAGNOSIS — S72002D Fracture of unspecified part of neck of left femur, subsequent encounter for closed fracture with routine healing: Secondary | ICD-10-CM | POA: Diagnosis not present

## 2020-01-11 DIAGNOSIS — S72012D Unspecified intracapsular fracture of left femur, subsequent encounter for closed fracture with routine healing: Secondary | ICD-10-CM | POA: Diagnosis not present

## 2020-01-11 DIAGNOSIS — M19042 Primary osteoarthritis, left hand: Secondary | ICD-10-CM | POA: Diagnosis not present

## 2020-01-11 DIAGNOSIS — M19041 Primary osteoarthritis, right hand: Secondary | ICD-10-CM | POA: Diagnosis not present

## 2020-01-11 DIAGNOSIS — E785 Hyperlipidemia, unspecified: Secondary | ICD-10-CM | POA: Diagnosis not present

## 2020-01-11 DIAGNOSIS — I10 Essential (primary) hypertension: Secondary | ICD-10-CM | POA: Diagnosis not present

## 2020-01-17 DIAGNOSIS — I10 Essential (primary) hypertension: Secondary | ICD-10-CM | POA: Diagnosis not present

## 2020-01-17 DIAGNOSIS — M19042 Primary osteoarthritis, left hand: Secondary | ICD-10-CM | POA: Diagnosis not present

## 2020-01-17 DIAGNOSIS — E785 Hyperlipidemia, unspecified: Secondary | ICD-10-CM | POA: Diagnosis not present

## 2020-01-17 DIAGNOSIS — M19041 Primary osteoarthritis, right hand: Secondary | ICD-10-CM | POA: Diagnosis not present

## 2020-01-17 DIAGNOSIS — S72012D Unspecified intracapsular fracture of left femur, subsequent encounter for closed fracture with routine healing: Secondary | ICD-10-CM | POA: Diagnosis not present

## 2020-01-20 DIAGNOSIS — M19042 Primary osteoarthritis, left hand: Secondary | ICD-10-CM | POA: Diagnosis not present

## 2020-01-20 DIAGNOSIS — S72012D Unspecified intracapsular fracture of left femur, subsequent encounter for closed fracture with routine healing: Secondary | ICD-10-CM | POA: Diagnosis not present

## 2020-01-20 DIAGNOSIS — E785 Hyperlipidemia, unspecified: Secondary | ICD-10-CM | POA: Diagnosis not present

## 2020-01-20 DIAGNOSIS — I10 Essential (primary) hypertension: Secondary | ICD-10-CM | POA: Diagnosis not present

## 2020-01-20 DIAGNOSIS — M19041 Primary osteoarthritis, right hand: Secondary | ICD-10-CM | POA: Diagnosis not present

## 2020-01-25 DIAGNOSIS — E785 Hyperlipidemia, unspecified: Secondary | ICD-10-CM | POA: Diagnosis not present

## 2020-01-25 DIAGNOSIS — M19041 Primary osteoarthritis, right hand: Secondary | ICD-10-CM | POA: Diagnosis not present

## 2020-01-25 DIAGNOSIS — S72012D Unspecified intracapsular fracture of left femur, subsequent encounter for closed fracture with routine healing: Secondary | ICD-10-CM | POA: Diagnosis not present

## 2020-01-25 DIAGNOSIS — M19042 Primary osteoarthritis, left hand: Secondary | ICD-10-CM | POA: Diagnosis not present

## 2020-01-25 DIAGNOSIS — I10 Essential (primary) hypertension: Secondary | ICD-10-CM | POA: Diagnosis not present

## 2020-02-06 DIAGNOSIS — S72002D Fracture of unspecified part of neck of left femur, subsequent encounter for closed fracture with routine healing: Secondary | ICD-10-CM | POA: Diagnosis not present

## 2020-02-08 DIAGNOSIS — K2970 Gastritis, unspecified, without bleeding: Secondary | ICD-10-CM | POA: Diagnosis not present

## 2020-02-08 DIAGNOSIS — I10 Essential (primary) hypertension: Secondary | ICD-10-CM | POA: Diagnosis not present

## 2020-02-08 DIAGNOSIS — Z23 Encounter for immunization: Secondary | ICD-10-CM | POA: Diagnosis not present

## 2020-02-08 DIAGNOSIS — E782 Mixed hyperlipidemia: Secondary | ICD-10-CM | POA: Diagnosis not present

## 2020-02-08 DIAGNOSIS — N183 Chronic kidney disease, stage 3 unspecified: Secondary | ICD-10-CM | POA: Diagnosis not present

## 2020-02-22 DIAGNOSIS — M3509 Sicca syndrome with other organ involvement: Secondary | ICD-10-CM | POA: Diagnosis not present

## 2020-02-22 DIAGNOSIS — S72002A Fracture of unspecified part of neck of left femur, initial encounter for closed fracture: Secondary | ICD-10-CM | POA: Diagnosis not present

## 2020-02-22 DIAGNOSIS — I63511 Cerebral infarction due to unspecified occlusion or stenosis of right middle cerebral artery: Secondary | ICD-10-CM | POA: Diagnosis not present

## 2020-02-22 DIAGNOSIS — E782 Mixed hyperlipidemia: Secondary | ICD-10-CM | POA: Diagnosis not present

## 2020-02-22 DIAGNOSIS — L259 Unspecified contact dermatitis, unspecified cause: Secondary | ICD-10-CM | POA: Diagnosis not present

## 2020-02-22 DIAGNOSIS — Z23 Encounter for immunization: Secondary | ICD-10-CM | POA: Diagnosis not present

## 2020-02-25 DIAGNOSIS — N183 Chronic kidney disease, stage 3 unspecified: Secondary | ICD-10-CM | POA: Diagnosis not present

## 2020-02-25 DIAGNOSIS — K2100 Gastro-esophageal reflux disease with esophagitis, without bleeding: Secondary | ICD-10-CM | POA: Diagnosis not present

## 2020-02-25 DIAGNOSIS — I129 Hypertensive chronic kidney disease with stage 1 through stage 4 chronic kidney disease, or unspecified chronic kidney disease: Secondary | ICD-10-CM | POA: Diagnosis not present

## 2020-02-25 DIAGNOSIS — E7849 Other hyperlipidemia: Secondary | ICD-10-CM | POA: Diagnosis not present

## 2020-02-29 DIAGNOSIS — M419 Scoliosis, unspecified: Secondary | ICD-10-CM | POA: Diagnosis not present

## 2020-02-29 DIAGNOSIS — Z23 Encounter for immunization: Secondary | ICD-10-CM | POA: Diagnosis not present

## 2020-02-29 DIAGNOSIS — Z6822 Body mass index (BMI) 22.0-22.9, adult: Secondary | ICD-10-CM | POA: Diagnosis not present

## 2020-03-13 DIAGNOSIS — S72002D Fracture of unspecified part of neck of left femur, subsequent encounter for closed fracture with routine healing: Secondary | ICD-10-CM | POA: Diagnosis not present

## 2020-04-19 DIAGNOSIS — L03313 Cellulitis of chest wall: Secondary | ICD-10-CM | POA: Diagnosis not present

## 2020-04-19 DIAGNOSIS — L723 Sebaceous cyst: Secondary | ICD-10-CM | POA: Diagnosis not present

## 2020-04-27 DIAGNOSIS — K2100 Gastro-esophageal reflux disease with esophagitis, without bleeding: Secondary | ICD-10-CM | POA: Diagnosis not present

## 2020-04-27 DIAGNOSIS — N183 Chronic kidney disease, stage 3 unspecified: Secondary | ICD-10-CM | POA: Diagnosis not present

## 2020-04-27 DIAGNOSIS — E7849 Other hyperlipidemia: Secondary | ICD-10-CM | POA: Diagnosis not present

## 2020-04-27 DIAGNOSIS — I129 Hypertensive chronic kidney disease with stage 1 through stage 4 chronic kidney disease, or unspecified chronic kidney disease: Secondary | ICD-10-CM | POA: Diagnosis not present

## 2020-05-09 DIAGNOSIS — R300 Dysuria: Secondary | ICD-10-CM | POA: Diagnosis not present

## 2020-05-09 DIAGNOSIS — Z6822 Body mass index (BMI) 22.0-22.9, adult: Secondary | ICD-10-CM | POA: Diagnosis not present

## 2020-07-16 DIAGNOSIS — R42 Dizziness and giddiness: Secondary | ICD-10-CM | POA: Diagnosis not present

## 2020-07-16 DIAGNOSIS — I951 Orthostatic hypotension: Secondary | ICD-10-CM | POA: Diagnosis not present

## 2020-07-16 DIAGNOSIS — R5383 Other fatigue: Secondary | ICD-10-CM | POA: Diagnosis not present

## 2020-07-16 DIAGNOSIS — Z6821 Body mass index (BMI) 21.0-21.9, adult: Secondary | ICD-10-CM | POA: Diagnosis not present

## 2020-07-16 DIAGNOSIS — M549 Dorsalgia, unspecified: Secondary | ICD-10-CM | POA: Diagnosis not present

## 2020-07-19 DIAGNOSIS — Z1231 Encounter for screening mammogram for malignant neoplasm of breast: Secondary | ICD-10-CM | POA: Diagnosis not present

## 2020-07-25 DIAGNOSIS — I129 Hypertensive chronic kidney disease with stage 1 through stage 4 chronic kidney disease, or unspecified chronic kidney disease: Secondary | ICD-10-CM | POA: Diagnosis not present

## 2020-07-25 DIAGNOSIS — N183 Chronic kidney disease, stage 3 unspecified: Secondary | ICD-10-CM | POA: Diagnosis not present

## 2020-07-25 DIAGNOSIS — E7849 Other hyperlipidemia: Secondary | ICD-10-CM | POA: Diagnosis not present

## 2020-07-25 DIAGNOSIS — K2100 Gastro-esophageal reflux disease with esophagitis, without bleeding: Secondary | ICD-10-CM | POA: Diagnosis not present

## 2020-08-16 DIAGNOSIS — I10 Essential (primary) hypertension: Secondary | ICD-10-CM | POA: Diagnosis not present

## 2020-08-16 DIAGNOSIS — E7849 Other hyperlipidemia: Secondary | ICD-10-CM | POA: Diagnosis not present

## 2020-08-16 DIAGNOSIS — K2100 Gastro-esophageal reflux disease with esophagitis, without bleeding: Secondary | ICD-10-CM | POA: Diagnosis not present

## 2020-08-16 DIAGNOSIS — N183 Chronic kidney disease, stage 3 unspecified: Secondary | ICD-10-CM | POA: Diagnosis not present

## 2020-08-16 DIAGNOSIS — E782 Mixed hyperlipidemia: Secondary | ICD-10-CM | POA: Diagnosis not present

## 2020-08-16 DIAGNOSIS — E559 Vitamin D deficiency, unspecified: Secondary | ICD-10-CM | POA: Diagnosis not present

## 2020-08-16 DIAGNOSIS — E7800 Pure hypercholesterolemia, unspecified: Secondary | ICD-10-CM | POA: Diagnosis not present

## 2020-08-21 DIAGNOSIS — E213 Hyperparathyroidism, unspecified: Secondary | ICD-10-CM | POA: Diagnosis not present

## 2020-08-21 DIAGNOSIS — N289 Disorder of kidney and ureter, unspecified: Secondary | ICD-10-CM | POA: Diagnosis not present

## 2020-08-21 DIAGNOSIS — Z1389 Encounter for screening for other disorder: Secondary | ICD-10-CM | POA: Diagnosis not present

## 2020-08-21 DIAGNOSIS — M3509 Sicca syndrome with other organ involvement: Secondary | ICD-10-CM | POA: Diagnosis not present

## 2020-08-21 DIAGNOSIS — I63511 Cerebral infarction due to unspecified occlusion or stenosis of right middle cerebral artery: Secondary | ICD-10-CM | POA: Diagnosis not present

## 2020-08-21 DIAGNOSIS — E7849 Other hyperlipidemia: Secondary | ICD-10-CM | POA: Diagnosis not present

## 2020-08-25 DIAGNOSIS — N183 Chronic kidney disease, stage 3 unspecified: Secondary | ICD-10-CM | POA: Diagnosis not present

## 2020-08-25 DIAGNOSIS — I129 Hypertensive chronic kidney disease with stage 1 through stage 4 chronic kidney disease, or unspecified chronic kidney disease: Secondary | ICD-10-CM | POA: Diagnosis not present

## 2020-08-25 DIAGNOSIS — K2100 Gastro-esophageal reflux disease with esophagitis, without bleeding: Secondary | ICD-10-CM | POA: Diagnosis not present

## 2020-08-25 DIAGNOSIS — E7849 Other hyperlipidemia: Secondary | ICD-10-CM | POA: Diagnosis not present

## 2020-09-19 DIAGNOSIS — L720 Epidermal cyst: Secondary | ICD-10-CM | POA: Diagnosis not present

## 2020-09-24 DIAGNOSIS — E7849 Other hyperlipidemia: Secondary | ICD-10-CM | POA: Diagnosis not present

## 2020-09-24 DIAGNOSIS — N183 Chronic kidney disease, stage 3 unspecified: Secondary | ICD-10-CM | POA: Diagnosis not present

## 2020-09-24 DIAGNOSIS — K2100 Gastro-esophageal reflux disease with esophagitis, without bleeding: Secondary | ICD-10-CM | POA: Diagnosis not present

## 2020-09-24 DIAGNOSIS — I129 Hypertensive chronic kidney disease with stage 1 through stage 4 chronic kidney disease, or unspecified chronic kidney disease: Secondary | ICD-10-CM | POA: Diagnosis not present

## 2020-10-19 DIAGNOSIS — Z6821 Body mass index (BMI) 21.0-21.9, adult: Secondary | ICD-10-CM | POA: Diagnosis not present

## 2020-10-25 DIAGNOSIS — E7849 Other hyperlipidemia: Secondary | ICD-10-CM | POA: Diagnosis not present

## 2020-10-25 DIAGNOSIS — N183 Chronic kidney disease, stage 3 unspecified: Secondary | ICD-10-CM | POA: Diagnosis not present

## 2020-10-25 DIAGNOSIS — K2100 Gastro-esophageal reflux disease with esophagitis, without bleeding: Secondary | ICD-10-CM | POA: Diagnosis not present

## 2020-10-25 DIAGNOSIS — I129 Hypertensive chronic kidney disease with stage 1 through stage 4 chronic kidney disease, or unspecified chronic kidney disease: Secondary | ICD-10-CM | POA: Diagnosis not present

## 2020-10-31 NOTE — Progress Notes (Signed)
Cardiology Office Note  Date: 11/01/2020   ID: Peggy Ramirez, DOB 09-11-1933, MRN [id number]  PCP:  Manon Hilding, MD  Cardiologist:  Carlyle Dolly, MD Electrophysiologist:  None   Chief Complaint: SOB 3 - 4 months / Chest tightness  History of Present Illness: Peggy Ramirez is a 87 y.o. female with a history of HTN, Lupus, HLD.  She was last seen by Dr. Harl Bowie on 10/11/2019.  She was experiencing mild chest pain at times with nonspecific symptoms.  No exertional symptoms.  She was compliant with her blood pressure medications.  History of CVA with admission on June 2017 with left-handed weakness.  MRI demonstrating small cortical infarct in posterior right MCA territory.  He had 30-day event monitor without significant arrhythmias.  She had a long history of chest pain with negative ischemic testing.  She had no recent specific cardiac symptoms.  Plan was to continue to monitor.  Blood pressure was at goal and she was continuing current medication.  She was off statin due to side effects.  Her LDL without therapy was reasonable.  Dr. Harl Bowie did not see strong indication to consider statin alternatives at that time.  She is here for recent complaints of shortness of breath over the last 2 to 3 months.  Also complaining of recent chest tightness/chest pain which can occur suddenly with or without activity.  She describes it as a significant pain but is unable to rate.  Lasting only seconds.  She denies radiation or association with exertion.  She denies any associated nausea, vomiting, or diaphoresis when it occurs.  She states it can sometimes occur at random.  She states it seems to be sporadic and can occur sometimes more frequently and at other times less frequently.  She also complains of some dizziness with symptoms of lightheadedness and occasional sensation of feeling near syncopal.  She states that sometimes she feels her heart racing or palpitating when this occurs.  She denies  any PND or orthopnea.  No new CVA or TIA-like symptoms.  Denies any claudication-like symptoms, bleeding, DVT or PE-like symptoms, lower extremity edema.  She states her PCP Dr. Quintin Alto recently stopped her HCTZ and potassium.  Past Medical History:  Diagnosis Date   Chronic constipation    Chronic left lower quadrant pain    Headache(784.0)    High cholesterol    Hydrosalpinx    LEFT   Hypertension    Lupus ([place])    Membranous glomerulonephritis    followed by Dr. Elige Radon   Ovarian fibroma    RIGHT   Pelvic adhesions    Sjogren's syndrome [facility])     Past Surgical History:  Procedure Laterality Date   APPENDECTOMY  2002   HIP PINNING,CANNULATED Left 12/15/2019   Procedure: CANNULATED HIP PINNING;  Surgeon: Shona Needles, MD;  Location: [facility];  Service: Orthopedics;  Laterality: Left;   LAPAROSCOPIC CHOLECYSTECTOMY  2002   TONSILLECTOMY  1941   TOTAL ABDOMINAL HYSTERECTOMY W/ BILATERAL SALPINGOOPHORECTOMY  2005   VAGINAL HYSTERECTOMY  1981    Current Outpatient Medications  Medication Sig Dispense Refill   Ascorbic Acid (VITAMIN C) 1000 MG tablet Take 1,000 mg by mouth daily.     Cholecalciferol (VITAMIN D-3 PO) Take 1 tablet by mouth daily.     loratadine (CLARITIN) 10 MG tablet Take 10 mg by mouth daily.     Misc. Devices (3-IN-1 BEDSIDE TOILET) MISC Patient needs 3 in 1 bedside toilet. Recent hip fracture with  repair 1 each 1   Multiple Vitamin (MULTIVITAMIN) tablet Take 1 tablet by mouth daily.     pantoprazole (PROTONIX) 40 MG tablet Take 40 mg by mouth 2 (two) times daily.      polyethylene glycol powder (GLYCOLAX/MIRALAX) 17 GM/SCOOP powder Take 17 g by mouth daily.       zinc gluconate 50 MG tablet Take 50 mg by mouth daily.     hydrochlorothiazide (MICROZIDE) 12.5 MG capsule TAKE 1 CAPSULE BY MOUTH ONCE DAILY PATIENT  NEEDS  APPOINTMENT  FOR  FURTHER  REFILLS (Patient not taking: Reported on 11/01/2020) 60 capsule 0   HYDROcodone-acetaminophen (NORCO/VICODIN) 5-325  MG tablet Take 1 tablet by mouth every 6 (six) hours as needed for severe pain. (Patient not taking: Reported on 11/01/2020) 28 tablet 0   ondansetron (ZOFRAN) 4 MG tablet Take 1 tablet (4 mg total) by mouth every 6 (six) hours as needed for nausea or vomiting. (Patient not taking: Reported on 11/01/2020) 20 tablet 0   potassium chloride (KLOR-CON) 10 MEQ tablet Take 10 mEq by mouth daily. (Patient not taking: Reported on 11/01/2020)     triamcinolone cream (KENALOG) 0.1 % Apply 1 application topically 3 (three) times daily. (Patient not taking: Reported on 11/01/2020)     Vitamin E 400 units TABS Take 400 Units by mouth daily. (Patient not taking: Reported on 11/01/2020)     No current facility-administered medications for this visit.   Allergies:  Atorvastatin, Cellcept [mycophenolate mofetil], Medrol [methylprednisolone], Mycophenolate mofetil, Penicillins, Sulfonamide derivatives, Topiramate, Verapamil, and Levofloxacin   Social History: The patient  reports that she has never smoked. She has never used smokeless tobacco. She reports that she does not drink alcohol.   Family History: The patient's family history includes Anxiety disorder in an other family member; Depression in an other family member; Heart disease in an other family member.   ROS:  Please see the history of present illness. Otherwise, complete review of systems is positive for none.  All other systems are reviewed and negative.   Physical Exam: VS:  BP (!) 142/80   Pulse 73   Ht 5' (1.524 m)   Wt 109 lb 9.6 oz (49.7 kg)   SpO2 98%   BMI 21.40 kg/m , BMI Body mass index is 21.4 kg/m.  Wt Readings from Last 3 Encounters:  11/01/20 109 lb 9.6 oz (49.7 kg)  12/17/19 111 lb 5.3 oz (50.5 kg)  10/11/19 127 lb 3.2 oz (57.7 kg)    General: Patient appears comfortable at rest. Neck: Supple, no elevated JVP or carotid bruits, no thyromegaly. Lungs: Clear to auscultation, nonlabored breathing at rest. Cardiac: Regular rate and  rhythm, no S3 or significant systolic murmur, no pericardial rub. Extremities: No pitting edema, distal pulses 2+. Skin: Warm and dry. Musculoskeletal: No kyphosis. Neuropsychiatric: Alert and oriented x3, affect grossly appropriate.  ECG: 10/31/2020 EKG shows normal sinus rhythm rate of 71, right superior axis deviation, septal infarct, age undetermined.  Recent Labwork: 12/15/2019: ALT 20; AST 22 12/16/2019: BUN 16; Creatinine, Ser 0.78; Potassium 3.6; Sodium 132 12/17/2019: Hemoglobin 12.4; Magnesium 1.9; Platelets 188     Component Value Date/Time   CHOL 163 10/16/2015 0612   TRIG 156 (H) 10/16/2015 0612   HDL 39 (L) 10/16/2015 0612   CHOLHDL 4.2 10/16/2015 0612   VLDL 31 10/16/2015 0612   LDLCALC 93 10/16/2015 0612    Other Studies Reviewed Today:  11/2011 DSE Baseline LVEF 60-65%, negative for ischemia   10/2014 Lexiscan MPI The study  is normal. This is a low risk study. Nuclear stress EF: 60%. There was no ST segment deviation noted during stress.   09/2015 echo Study Conclusions   - Left ventricle: The cavity size was normal. There was focal basal   hypertrophy. Systolic function was normal. The estimated ejection   fraction was in the range of 55% to 60%. Wall motion was normal;   there were no regional wall motion abnormalities. Doppler   parameters are consistent with abnormal left ventricular   relaxation (grade 1 diastolic dysfunction). - Aortic valve: Mildly calcified annulus. Trileaflet; normal   thickness leaflets. Valve area (VTI): 2.18 cm^2. Valve area   (Vmax): 2.24 cm^2. - Mitral valve: There was mild regurgitation. - Atrial septum: No defect or patent foramen ovale was identified. - Technically adequate study.   09/2015 Carotid US IMPRESSION: Mild amount of plaque at both carotid bifurcations, as above. Estimated bilateral ICA stenoses are less than 50%.   Assessment and Plan:  1. Shortness of breath   2. Chest tightness   3. History of CVA  (cerebrovascular accident)   4. Chest pain, unspecified type   5. Palpitations    1. Shortness of breath Complaining of recent increases in shortness of breath over the last 2 to 3 months.  Can be worse with exertion.  Please get a repeat echocardiogram.  2. Chest tightness /chest pain Complaining of recent chest pain and midsternal with and without exertion without radiation to neck, arm, back, jaw.  Denies any associated nausea, vomiting, diaphoresis.  Please get a Lexiscan stress test.    3.  Palpitations/dizziness Complaining of episodes of dizziness with associated palpitations.  EKG today shows normal sinus rhythm rate of 71, right superior axis deviation, septal infarct, age undetermined.  Please get a 14-day ZIO monitor.  4. History of CVA (cerebrovascular accident) History of CVA.  No focal neurological deficits.  No new neurological symptoms.  Medication Adjustments/Labs and Tests Ordered: Current medicines are reviewed at length with the patient today.  Concerns regarding medicines are outlined above.   Disposition: Follow-up with Dr. Harl Bowie or APP 6 to 8 weeks.  Signed, Levell July, NP 11/01/2020 8:57 AM    [facility] [place], [place], [place] [address] Phone: [phone]; Fax: [phone]

## 2020-11-01 ENCOUNTER — Encounter: Payer: Self-pay | Admitting: Family Medicine

## 2020-11-01 ENCOUNTER — Ambulatory Visit (INDEPENDENT_AMBULATORY_CARE_PROVIDER_SITE_OTHER): Payer: Medicare Other

## 2020-11-01 ENCOUNTER — Ambulatory Visit: Payer: Medicare Other | Admitting: Family Medicine

## 2020-11-01 ENCOUNTER — Other Ambulatory Visit: Payer: Self-pay | Admitting: Family Medicine

## 2020-11-01 VITALS — BP 142/80 | HR 73 | Ht 60.0 in | Wt 109.6 lb

## 2020-11-01 DIAGNOSIS — M25551 Pain in right hip: Secondary | ICD-10-CM | POA: Diagnosis not present

## 2020-11-01 DIAGNOSIS — R0602 Shortness of breath: Secondary | ICD-10-CM

## 2020-11-01 DIAGNOSIS — R002 Palpitations: Secondary | ICD-10-CM

## 2020-11-01 DIAGNOSIS — R42 Dizziness and giddiness: Secondary | ICD-10-CM

## 2020-11-01 DIAGNOSIS — R079 Chest pain, unspecified: Secondary | ICD-10-CM | POA: Diagnosis not present

## 2020-11-01 DIAGNOSIS — R0789 Other chest pain: Secondary | ICD-10-CM

## 2020-11-01 DIAGNOSIS — Z8673 Personal history of transient ischemic attack (TIA), and cerebral infarction without residual deficits: Secondary | ICD-10-CM | POA: Diagnosis not present

## 2020-11-01 DIAGNOSIS — M545 Low back pain, unspecified: Secondary | ICD-10-CM | POA: Diagnosis not present

## 2020-11-01 NOTE — Patient Instructions (Signed)
Medication Instructions:  Your physician recommends that you continue on your current medications as directed. Please refer to the Current Medication list given to you today.  *If you need a refill on your cardiac medications before your next appointment, please call your pharmacy*   Lab Work: None If you have labs (blood work) drawn today and your tests are completely normal, you will receive your results only by: [place] (if you have MyChart) OR A paper copy in the mail If you have any lab test that is abnormal or we need to change your treatment, we will call you to review the results.   Testing/Procedures: Your physician has requested that you have a lexiscan myoview. For further information please visit [email or web address]. Please follow instruction sheet, as given. Your physician has requested that you have an echocardiogram. Echocardiography is a painless test that uses sound waves to create images of your heart. It provides your doctor with information about the size and shape of your heart and how well your heart's chambers and valves are working. This procedure takes approximately one hour. There are no restrictions for this procedure.    Follow-Up: At [facility], you and your health needs are our priority.  As part of our continuing mission to provide you with exceptional heart care, we have created designated Provider Care Teams.  These Care Teams include your primary Cardiologist (physician) and Advanced Practice Providers (APPs -  Physician Assistants and Nurse Practitioners) who all work together to provide you with the care you need, when you need it.  We recommend signing up for the patient portal called "MyChart".  Sign up information is provided on this After Visit Summary.  MyChart is used to connect with patients for Virtual Visits (Telemedicine).  Patients are able to view lab/test results, encounter notes, upcoming appointments, etc.  Non-urgent messages can  be sent to your provider as well.   To learn more about what you can do with MyChart, go to [email or web address].    Your next appointment:   6-8 week(s)  The format for your next appointment:   In Person  Provider:   Katina Dung, NP   Other Instructions ZIO AT  Call [place] with any questions during wear 24/7: [phone] Zio patch should be worn for 14 days unless otherwise instructed The [employer] ([employer]) will call you 24-48 hrs. after Elwyn Reach is applied, be sure to answer the phone from a 224 area code.  They may call during wear with important information regarding your heart, so answer any calls from [employer] Do not shower for 24 hours after Zio is applied (sponge bath is fine, just keep the patch dry) After that, when showering avoid direct shower stream of water onto Zio patch, pat dry around the patch Avoid excessive sweating Push the button when you are feeling any cardiac symptoms and record them in the logbook or on the Zio app Refer to the removal date listed on the front of the symptom logbook and remove Zio patch according to the instructions in the back of the logbook Place Zio patch inside transmitter (sticky side facing up, button facing down) and put both the gateway and symptom logbook in the prepaid mailing envelope included in the back of the transmitter.  Place inside mailbox or any USPS mailbox

## 2020-11-12 ENCOUNTER — Other Ambulatory Visit: Payer: Self-pay

## 2020-11-12 ENCOUNTER — Inpatient Hospital Stay (HOSPITAL_COMMUNITY)
Admission: EM | Admit: 2020-11-12 | Discharge: 2020-11-19 | DRG: 064 | Disposition: A | Discharge status: Skilled Nursing Facility | Payer: Medicare Other | Attending: Family Medicine | Admitting: Family Medicine

## 2020-11-12 ENCOUNTER — Encounter (HOSPITAL_COMMUNITY): Payer: Self-pay | Admitting: *Deleted

## 2020-11-12 ENCOUNTER — Emergency Department (HOSPITAL_COMMUNITY): Payer: Medicare Other

## 2020-11-12 DIAGNOSIS — K110 Atrophy of salivary gland: Secondary | ICD-10-CM | POA: Diagnosis not present

## 2020-11-12 DIAGNOSIS — Z90722 Acquired absence of ovaries, bilateral: Secondary | ICD-10-CM

## 2020-11-12 DIAGNOSIS — K5904 Chronic idiopathic constipation: Secondary | ICD-10-CM | POA: Diagnosis not present

## 2020-11-12 DIAGNOSIS — E7800 Pure hypercholesterolemia, unspecified: Secondary | ICD-10-CM | POA: Diagnosis not present

## 2020-11-12 DIAGNOSIS — I69318 Other symptoms and signs involving cognitive functions following cerebral infarction: Secondary | ICD-10-CM | POA: Diagnosis not present

## 2020-11-12 DIAGNOSIS — I639 Cerebral infarction, unspecified: Secondary | ICD-10-CM | POA: Diagnosis present

## 2020-11-12 DIAGNOSIS — Z818 Family history of other mental and behavioral disorders: Secondary | ICD-10-CM

## 2020-11-12 DIAGNOSIS — E876 Hypokalemia: Secondary | ICD-10-CM | POA: Diagnosis not present

## 2020-11-12 DIAGNOSIS — G9341 Metabolic encephalopathy: Secondary | ICD-10-CM | POA: Diagnosis not present

## 2020-11-12 DIAGNOSIS — Z87440 Personal history of urinary (tract) infections: Secondary | ICD-10-CM

## 2020-11-12 DIAGNOSIS — Z90710 Acquired absence of both cervix and uterus: Secondary | ICD-10-CM | POA: Diagnosis not present

## 2020-11-12 DIAGNOSIS — K5909 Other constipation: Secondary | ICD-10-CM | POA: Diagnosis not present

## 2020-11-12 DIAGNOSIS — I480 Paroxysmal atrial fibrillation: Secondary | ICD-10-CM | POA: Diagnosis not present

## 2020-11-12 DIAGNOSIS — Z79899 Other long term (current) drug therapy: Secondary | ICD-10-CM | POA: Diagnosis not present

## 2020-11-12 DIAGNOSIS — E042 Nontoxic multinodular goiter: Secondary | ICD-10-CM | POA: Diagnosis not present

## 2020-11-12 DIAGNOSIS — I69314 Frontal lobe and executive function deficit following cerebral infarction: Secondary | ICD-10-CM | POA: Diagnosis not present

## 2020-11-12 DIAGNOSIS — R29705 NIHSS score 5: Secondary | ICD-10-CM | POA: Diagnosis present

## 2020-11-12 DIAGNOSIS — R55 Syncope and collapse: Secondary | ICD-10-CM | POA: Diagnosis not present

## 2020-11-12 DIAGNOSIS — M3500 Sicca syndrome, unspecified: Secondary | ICD-10-CM | POA: Diagnosis present

## 2020-11-12 DIAGNOSIS — R488 Other symbolic dysfunctions: Secondary | ICD-10-CM | POA: Diagnosis not present

## 2020-11-12 DIAGNOSIS — I771 Stricture of artery: Secondary | ICD-10-CM | POA: Diagnosis not present

## 2020-11-12 DIAGNOSIS — R030 Elevated blood-pressure reading, without diagnosis of hypertension: Secondary | ICD-10-CM | POA: Diagnosis not present

## 2020-11-12 DIAGNOSIS — Z20822 Contact with and (suspected) exposure to covid-19: Secondary | ICD-10-CM | POA: Diagnosis not present

## 2020-11-12 DIAGNOSIS — I959 Hypotension, unspecified: Secondary | ICD-10-CM | POA: Diagnosis not present

## 2020-11-12 DIAGNOSIS — R002 Palpitations: Secondary | ICD-10-CM | POA: Diagnosis not present

## 2020-11-12 DIAGNOSIS — Z880 Allergy status to penicillin: Secondary | ICD-10-CM

## 2020-11-12 DIAGNOSIS — I10 Essential (primary) hypertension: Secondary | ICD-10-CM | POA: Diagnosis present

## 2020-11-12 DIAGNOSIS — G4700 Insomnia, unspecified: Secondary | ICD-10-CM | POA: Diagnosis not present

## 2020-11-12 DIAGNOSIS — Z9049 Acquired absence of other specified parts of digestive tract: Secondary | ICD-10-CM | POA: Diagnosis not present

## 2020-11-12 DIAGNOSIS — E785 Hyperlipidemia, unspecified: Secondary | ICD-10-CM | POA: Diagnosis not present

## 2020-11-12 DIAGNOSIS — R4182 Altered mental status, unspecified: Secondary | ICD-10-CM | POA: Diagnosis present

## 2020-11-12 DIAGNOSIS — R42 Dizziness and giddiness: Secondary | ICD-10-CM | POA: Diagnosis not present

## 2020-11-12 DIAGNOSIS — R9431 Abnormal electrocardiogram [ECG] [EKG]: Secondary | ICD-10-CM | POA: Diagnosis not present

## 2020-11-12 DIAGNOSIS — E041 Nontoxic single thyroid nodule: Secondary | ICD-10-CM | POA: Diagnosis not present

## 2020-11-12 DIAGNOSIS — R410 Disorientation, unspecified: Secondary | ICD-10-CM

## 2020-11-12 DIAGNOSIS — R262 Difficulty in walking, not elsewhere classified: Secondary | ICD-10-CM | POA: Diagnosis not present

## 2020-11-12 DIAGNOSIS — F32A Depression, unspecified: Secondary | ICD-10-CM | POA: Diagnosis not present

## 2020-11-12 DIAGNOSIS — K219 Gastro-esophageal reflux disease without esophagitis: Secondary | ICD-10-CM

## 2020-11-12 DIAGNOSIS — R29818 Other symptoms and signs involving the nervous system: Secondary | ICD-10-CM | POA: Diagnosis not present

## 2020-11-12 DIAGNOSIS — E079 Disorder of thyroid, unspecified: Secondary | ICD-10-CM

## 2020-11-12 DIAGNOSIS — E048 Other specified nontoxic goiter: Secondary | ICD-10-CM | POA: Diagnosis not present

## 2020-11-12 DIAGNOSIS — M6281 Muscle weakness (generalized): Secondary | ICD-10-CM | POA: Diagnosis not present

## 2020-11-12 DIAGNOSIS — D750 Familial erythrocytosis: Secondary | ICD-10-CM | POA: Diagnosis not present

## 2020-11-12 DIAGNOSIS — I6340 Cerebral infarction due to embolism of unspecified cerebral artery: Principal | ICD-10-CM | POA: Diagnosis present

## 2020-11-12 DIAGNOSIS — I672 Cerebral atherosclerosis: Secondary | ICD-10-CM | POA: Diagnosis not present

## 2020-11-12 DIAGNOSIS — Z888 Allergy status to other drugs, medicaments and biological substances status: Secondary | ICD-10-CM

## 2020-11-12 DIAGNOSIS — R278 Other lack of coordination: Secondary | ICD-10-CM | POA: Diagnosis not present

## 2020-11-12 HISTORY — DX: Cerebral infarction, unspecified: I63.9

## 2020-11-12 LAB — RESP PANEL BY RT-PCR (FLU A&B, COVID) ARPGX2
Influenza A by PCR: NEGATIVE
Influenza B by PCR: NEGATIVE
SARS Coronavirus 2 by RT PCR: NEGATIVE

## 2020-11-12 LAB — URINALYSIS, ROUTINE W REFLEX MICROSCOPIC
Bilirubin Urine: NEGATIVE
Glucose, UA: NEGATIVE mg/dL
Hgb urine dipstick: NEGATIVE
Ketones, ur: 5 mg/dL — AB
Leukocytes,Ua: NEGATIVE
Nitrite: NEGATIVE
Protein, ur: NEGATIVE mg/dL
Specific Gravity, Urine: 1.008 (ref 1.005–1.030)
pH: 7 (ref 5.0–8.0)

## 2020-11-12 LAB — COMPREHENSIVE METABOLIC PANEL
ALT: 21 U/L (ref 0–44)
AST: 26 U/L (ref 15–41)
Albumin: 4.4 g/dL (ref 3.5–5.0)
Alkaline Phosphatase: 66 U/L (ref 38–126)
Anion gap: 8 (ref 5–15)
BUN: 13 mg/dL (ref 8–23)
CO2: 27 mmol/L (ref 22–32)
Calcium: 11.3 mg/dL — ABNORMAL HIGH (ref 8.9–10.3)
Chloride: 102 mmol/L (ref 98–111)
Creatinine, Ser: 0.69 mg/dL (ref 0.44–1.00)
GFR, Estimated: >60 mL/min (ref >60)
Glucose, Bld: 92 mg/dL (ref 70–99)
Potassium: 3.8 mmol/L (ref 3.5–5.1)
Sodium: 137 mmol/L (ref 135–145)
Total Bilirubin: 0.7 mg/dL (ref 0.3–1.2)
Total Protein: 7.6 g/dL (ref 6.5–8.1)

## 2020-11-12 LAB — RAPID URINE DRUG SCREEN, HOSP PERFORMED
Amphetamines: NOT DETECTED
Barbiturates: NOT DETECTED
Benzodiazepines: NOT DETECTED
Cocaine: NOT DETECTED
Opiates: NOT DETECTED
Tetrahydrocannabinol: NOT DETECTED

## 2020-11-12 LAB — ETHANOL: Alcohol, Ethyl (B): <10 mg/dL (ref <10)

## 2020-11-12 LAB — CBC WITH DIFFERENTIAL/PLATELET
Abs Immature Granulocytes: 0.01 10*3/uL (ref 0.00–0.07)
Basophils Absolute: 0 10*3/uL (ref 0.0–0.1)
Basophils Relative: 1 %
Eosinophils Absolute: 0.1 10*3/uL (ref 0.0–0.5)
Eosinophils Relative: 4 %
HCT: 41.8 % (ref 36.0–46.0)
Hemoglobin: 14 g/dL (ref 12.0–15.0)
Immature Granulocytes: 0 %
Lymphocytes Relative: 21 %
Lymphs Abs: 0.8 10*3/uL (ref 0.7–4.0)
MCH: 32.7 pg (ref 26.0–34.0)
MCHC: 33.5 g/dL (ref 30.0–36.0)
MCV: 97.7 fL (ref 80.0–100.0)
Monocytes Absolute: 0.5 10*3/uL (ref 0.1–1.0)
Monocytes Relative: 13 %
Neutro Abs: 2.4 10*3/uL (ref 1.7–7.7)
Neutrophils Relative %: 61 %
Platelets: 229 10*3/uL (ref 150–400)
RBC: 4.28 MIL/uL (ref 3.87–5.11)
RDW: 12.3 % (ref 11.5–15.5)
WBC: 4 10*3/uL (ref 4.0–10.5)
nRBC: 0 % (ref 0.0–0.2)

## 2020-11-12 LAB — GLUCOSE, CAPILLARY: Glucose-Capillary: 176 mg/dL — ABNORMAL HIGH (ref 70–99)

## 2020-11-12 LAB — CBG MONITORING, ED: Glucose-Capillary: 78 mg/dL (ref 70–99)

## 2020-11-12 MED ORDER — ENOXAPARIN SODIUM 40 MG/0.4ML IJ SOSY
40.0000 mg | PREFILLED_SYRINGE | INTRAMUSCULAR | Status: DC
  Administered 2020-11-13 – 2020-11-18 (×6): 40 mg via SUBCUTANEOUS
  Filled 2020-11-12 (×6): qty 0.4

## 2020-11-12 MED ORDER — ACETAMINOPHEN 325 MG PO TABS
650.0000 mg | ORAL_TABLET | Freq: Four times a day (QID) | ORAL | Status: DC | PRN
  Administered 2020-11-12 – 2020-11-16 (×2): 650 mg via ORAL
  Filled 2020-11-12 (×2): qty 2

## 2020-11-12 MED ORDER — POLYETHYLENE GLYCOL 3350 17 G PO PACK
17.0000 g | PACK | Freq: Every day | ORAL | Status: DC
  Administered 2020-11-13 – 2020-11-18 (×5): 17 g via ORAL
  Filled 2020-11-12 (×5): qty 1

## 2020-11-12 MED ORDER — HYDRALAZINE HCL 20 MG/ML IJ SOLN
10.0000 mg | Freq: Once | INTRAMUSCULAR | Status: AC
  Administered 2020-11-12: 10 mg via INTRAVENOUS
  Filled 2020-11-12: qty 1

## 2020-11-12 MED ORDER — PANTOPRAZOLE SODIUM 40 MG PO TBEC: 40.0000 mg | DELAYED_RELEASE_TABLET | Freq: Every day | ORAL | Status: DC | PRN

## 2020-11-12 NOTE — ED Provider Notes (Signed)
  Face-to-face evaluation   History: She presents for gradual onset of confusion several days ago characterized by seeing people who were not there, and confusion about day-to-day things.  She lives alone.  History is primarily from son who was in the room with her.  He states that she is "confused."  He has never seen her like this before.  He was wondering if she had a urinary tract infection.  She was treated for 1 recently.  She also saw cardiology regarding some chest pain and has a work-up in process including presently has a Zio patch on.  She also saw orthopedics recently and was placed on anti-inflammatory medication.  This was for joint pain.  No other recent illnesses.   Physical exam: Elderly female, she is confused about the process of blood drawing.  Her memory is poor.  There is no dysarthria or aphasia.  Medical screening examination/treatment/procedure(s) were conducted as a shared visit with non-physician practitioner(s) and myself.  I personally evaluated the patient during the encounter    Daleen Bo, MD 11/13/20 0006

## 2020-11-12 NOTE — ED Triage Notes (Signed)
[name] states pt started having confusion last night; pt is being treated for a UTI and is on an antibiotic; son states pt has been hallucinating

## 2020-11-12 NOTE — H&P (Signed)
History and Physical  Peggy Ramirez:[id number] DOB: 1934/04/23 DOA: 11/12/2020  Referring physician: Rodney Booze PA-C PCP: Manon Hilding, MD  Patient coming from:Home  Chief Complaint: Altered mental status  HPI: Peggy Ramirez is a 87 y.o. female with medical history significant for Sjogren's, lupus, hypertension, hyperlipidemia, GERD, recurrent UTI and chronic constipation who presented to the emergency department accompanied by son due to 5-day onset of worsening altered mental status.  Most of the history was obtained from son at bedside, per son, patient lives alone and was capable of IADL, however, starting on Wednesday (7/13), she was noted to start to hallucinate occasionally by seeing things that were not there in the house.  Yesterday, she called son that there were people on her porch and that she was going to call the police, son went to her house and noted that no one was on the porch.  She also commented of a lot of dirt on the floor, however, the floor was not dirty.  Per son, patient was normally alert and oriented x3, though, she started to present with some memory difficulty within the last year to 2 years, but she never had any hallucinations. Patient lives alone at home, she was usually very active with church activity prior to [place], she still goes to church now, but only Sundays and Thursdays.  She had 2 COVID vaccines and 2 boosters.  She never tested positive for COVID.  She was recently treated for UTI about 3 weeks ago, patient currently denies any irritative bladder symptoms.  ED Course:  In the emergency department, she was hemodynamically stable, BP was 142/100.  Work-up in the ED showed normal CBC and BMP, urinalysis was negative.  Alcohol level was negative, urine drug test was negative, urinalysis was negative. Chest x-ray showed no active disease CT of head without contrast showed no acute intracranial abnormality Hospitalist was asked to admit  patient for further evaluation and management.  Review of Systems: Constitutional: Negative for chills and fever.  HENT: Negative for ear pain and sore throat.   Eyes: Negative for pain and visual disturbance.  Respiratory: Negative for cough, chest tightness and shortness of breath.   Cardiovascular: Negative for chest pain and palpitations.  Gastrointestinal: Negative for abdominal pain and vomiting.  Endocrine: Negative for polyphagia and polyuria.  Genitourinary: Negative for decreased urine volume, dysuria, enuresis Musculoskeletal: Negative for arthralgias and back pain.  Skin: Negative for color change and rash.  Allergic/Immunologic: Negative for immunocompromised state.  Neurological: Positive for hallucinations.  Negative for tremors, syncope, speech difficulty, weakness Hematological: Does not bruise/bleed easily.  All other systems reviewed and are negative   Past Medical History:  Diagnosis Date   Chronic constipation    Chronic left lower quadrant pain    Headache(784.0)    High cholesterol    Hydrosalpinx    LEFT   Hypertension    Lupus ([place])    Membranous glomerulonephritis    followed by Dr. Elige Radon   Ovarian fibroma    RIGHT   Pelvic adhesions    Sjogren's syndrome [facility])    Past Surgical History:  Procedure Laterality Date   APPENDECTOMY  2002   HIP PINNING,CANNULATED Left 12/15/2019   Procedure: CANNULATED HIP PINNING;  Surgeon: Shona Needles, MD;  Location: [place];  Service: Orthopedics;  Laterality: Left;   LAPAROSCOPIC CHOLECYSTECTOMY  2002   TONSILLECTOMY  1941   TOTAL ABDOMINAL HYSTERECTOMY W/ BILATERAL SALPINGOOPHORECTOMY  2005   VAGINAL HYSTERECTOMY  9    Social History:  reports that she has never smoked. She has never used smokeless tobacco. She reports that she does not drink alcohol. No history on file for drug use.   Allergies  Allergen Reactions   Atorvastatin Other (See Comments)    Muscle aches   Cellcept [Mycophenolate  Mofetil] Other (See Comments)    unknown   Medrol [Methylprednisolone] Other (See Comments)    unknown   Mycophenolate Mofetil Other (See Comments)    unknown   Penicillins Itching   Sulfonamide Derivatives Nausea Only   Topiramate Other (See Comments)    unknown   Verapamil Other (See Comments)    Felt shakey   Levofloxacin Rash    Family History  Problem Relation Age of Onset   Heart disease Other    Anxiety disorder Other    Depression Other      Prior to Admission medications   Medication Sig Start Date End Date Taking? Authorizing Provider  Ascorbic Acid (VITAMIN C) 1000 MG tablet Take 1,000 mg by mouth daily.    [provider]  Cholecalciferol (VITAMIN D-3 PO) Take 1 tablet by mouth daily.    [provider]  hydrochlorothiazide (MICROZIDE) 12.5 MG capsule TAKE 1 CAPSULE BY MOUTH ONCE DAILY PATIENT  NEEDS  APPOINTMENT  FOR  FURTHER  REFILLS Patient not taking: Reported on 11/01/2020 04/11/19   Arnoldo Lenis, MD  HYDROcodone-acetaminophen (NORCO/VICODIN) 5-325 MG tablet Take 1 tablet by mouth every 6 (six) hours as needed for severe pain. Patient not taking: Reported on 11/01/2020 12/16/19   Delray Alt, PA-C  loratadine (CLARITIN) 10 MG tablet Take 10 mg by mouth daily.    [provider]  Misc. Devices (3-IN-1 BEDSIDE TOILET) MISC Patient needs 3 in 1 bedside toilet. Recent hip fracture with repair 12/17/19   Oretha Milch D, MD  Multiple Vitamin (MULTIVITAMIN) tablet Take 1 tablet by mouth daily.    [provider]  ondansetron (ZOFRAN) 4 MG tablet Take 1 tablet (4 mg total) by mouth every 6 (six) hours as needed for nausea or vomiting. Patient not taking: Reported on 11/01/2020 12/16/19   Delray Alt, PA-C  pantoprazole (PROTONIX) 40 MG tablet Take 40 mg by mouth 2 (two) times daily.     [provider]  polyethylene glycol powder (GLYCOLAX/MIRALAX) 17 GM/SCOOP powder Take 17 g by mouth daily.      [provider]  potassium chloride (KLOR-CON) 10 MEQ tablet Take 10 mEq by mouth daily. Patient not taking: Reported on 11/01/2020 10/30/19   [provider]  triamcinolone cream (KENALOG) 0.1 % Apply 1 application topically 3 (three) times daily. Patient not taking: Reported on 11/01/2020 12/06/19   [provider]  Vitamin E 400 units TABS Take 400 Units by mouth daily. Patient not taking: Reported on 11/01/2020    [provider]  zinc gluconate 50 MG tablet Take 50 mg by mouth daily.    [provider]    Physical Exam: BP (!) 185/79   Pulse 72   Temp 98.4 F (36.9 C) (Oral)   Resp 19   Ht 5' (1.524 m)   Wt 49.4 kg   SpO2 98%   BMI 21.29 kg/m   General: 87 y.o. year-old female well developed well nourished in no acute distress.  Alert and oriented x3. HEENT: NCAT, EOMI Neck: Supple, trachea medial Cardiovascular: Zio patch noted on patient's chest.  Regular rate and rhythm with no rubs or gallops.  No thyromegaly or JVD noted.  2/4 pulses in all 4 extremities. Respiratory: Clear to auscultation with no wheezes or rales. Good inspiratory effort. Abdomen: Soft, nontender nondistended with normal bowel sounds x4 quadrants. Muskuloskeletal: No cyanosis, clubbing or edema noted bilaterally Neuro: CN II-XII intact, strength 5/5 x 4, sensation, reflexes intact. Skin: No ulcerative lesions noted or rashes Psychiatry: Mood is appropriate for condition and setting          Labs on Admission:  Basic Metabolic Panel: Recent Labs  Lab 11/12/20 1615  NA 137  K 3.8  CL 102  CO2 27  GLUCOSE 92  BUN 13  CREATININE 0.69  CALCIUM 11.3*   Liver Function Tests: Recent Labs  Lab 11/12/20 1615  AST 26  ALT 21  ALKPHOS 66  BILITOT 0.7  PROT 7.6  ALBUMIN 4.4   No results for input(s): LIPASE, AMYLASE in the last 168 hours. No results for input(s): AMMONIA in the last 168 hours. CBC: Recent Labs  Lab 11/12/20 1615  WBC 4.0  NEUTROABS 2.4  HGB 14.0  HCT  41.8  MCV 97.7  PLT 229   Cardiac Enzymes: No results for input(s): CKTOTAL, CKMB, CKMBINDEX, TROPONINI in the last 168 hours.  BNP (last 3 results) No results for input(s): BNP in the last 8760 hours.  ProBNP (last 3 results) No results for input(s): PROBNP in the last 8760 hours.  CBG: Recent Labs  Lab 11/12/20 1549  GLUCAP 78    Radiological Exams on Admission: CT Head Wo Contrast  Result Date: 11/12/2020 CLINICAL DATA:  Delirium.  Confusion. EXAM: CT HEAD WITHOUT CONTRAST TECHNIQUE: Contiguous axial images were obtained from the base of the skull through the vertex without intravenous contrast. COMPARISON:  Head CT 10/15/2015 FINDINGS: Brain: Stable degree of atrophy there is moderate chronic small vessel ischemia with progression from 2017. no intracranial hemorrhage, mass effect, or midline shift. No hydrocephalus. The basilar cisterns are patent. No evidence of territorial infarct or acute ischemia. No extra-axial or intracranial fluid collection. Vascular: Atherosclerosis of skullbase vasculature without hyperdense vessel or abnormal calcification. Skull: No fracture or focal lesion. Sinuses/Orbits: Minor mucosal thickening of ethmoid air cells. No sinus fluid levels. Hypoplastic right mastoid air cells. No significant mastoid effusion. Unremarkable orbits. Other: None. IMPRESSION: 1. No acute intracranial abnormality. 2. Generalized atrophy and chronic small vessel ischemia. Electronically Signed   By: Keith Rake M.D.   On: 11/12/2020 18:06   DG Chest Portable 1 View  Result Date: 11/12/2020 CLINICAL DATA:  Altered mental status EXAM: PORTABLE CHEST 1 VIEW COMPARISON:  12/14/2019 FINDINGS: The heart size and mediastinal contours are within normal limits. Both lungs are clear. Metallic device overlies the left perihilar region, likely external to the patient. The visualized skeletal structures are unremarkable. IMPRESSION: No active disease. Electronically Signed   By: Fidela Salisbury MD   On: 11/12/2020 18:28    EKG: I independently viewed the EKG done and my findings are as followed: Normal sinus rhythm at a rate of 75 bpm  Assessment/Plan Present on Admission:  Altered mental status  Essential hypertension  Chronic constipation  Principal Problem:   Altered mental status Active Problems:   Essential hypertension   Chronic constipation   GERD (gastroesophageal reflux disease)  Altered mental status The cause of patient's hallucinations unknown at this time CBC and BMP were normal, no obvious sign of any infection at this time Alcohol level was negative, urine drug screen was negative Patient denies undergoing any stressful events at this  time, other than being alone at home more frequently than prior to onset of COVID-19 virus pandemia Continue fall precaution and neurochecks Continue to monitor outpatient for symptoms improvement Consider psych consult in the morning based on patient's symptoms  Essential hypertension (uncontrolled) Patient was no longer taking home HCTZ per med rec Hydralazine 10 mg x 1 will be given Consider starting patient on antihypertensive medication if BP continues to stay elevated in the morning  GERD Continue Protonix  Chronic constipation Continue MiraLAX   DVT prophylaxis: Lovenox  Code Status: Full code  Family Communication: Son at bedside (all questions answered to satisfaction)  Disposition Plan:  Patient is from:                        home Anticipated DC to:                   SNF or family members home Anticipated DC date:               2-3 days Anticipated DC barriers:          Patient requires inpatient observation due to new onset hallucination pending possible psych consult  Consults called: None  Admission status: Observation    Bernadette Hoit MD [facility]  11/12/2020, 8:36 PM

## 2020-11-12 NOTE — ED Provider Notes (Signed)
[facility] EMERGENCY DEPARTMENT Provider Note   CSN: [id number] Arrival date & time: 11/12/20  1259     History Chief Complaint  Patient presents with   Altered Mental Status    Peggy Ramirez is a 87 y.o. female.  HPI   Pt is a an 87 y/o female with a h/o constipation, headache, hld, htn, lupus, membranous glomerulonephritis, ovarian fibroma, pelvic, adhesions, sjogren's, who presents to the ED today for eval of AMS.  Hx limited by pts AMS, therefore a level 5 caveat applies.  Pt denies increased frequency, but son reports she has already urinated twice since being here. She is c/o lower abd pain but denies frequency or urgency. Pt denies fevers, chills. Son denies other associated sxs.   Pt has a h/o recurrent UTI. Was seen by PCP 2 weeks ago and was given abx. Son is not sure if she finished them. Yesterday son noted that she has been hallucinating since last night. She contacted him and told him that there were people on her porch and she was going to call the police. Upon his arrival there were no people in the porch. He further states she was seeing people in her house though he was the only person with her. States there was dirt all over the floor as well. He reports that she is normally oriented and does not have dx dementia. She has had some difficulty with memory that has been present over the last year to two years. Pt denies any recent illnesses or complaints.   Past Medical History:  Diagnosis Date   Chronic constipation    Chronic left lower quadrant pain    Headache(784.0)    High cholesterol    Hydrosalpinx    LEFT   Hypertension    Lupus ([place])    Membranous glomerulonephritis    followed by Dr. Elige Radon   Ovarian fibroma    RIGHT   Pelvic adhesions    Sjogren's syndrome [facility])     Patient Active Problem List   Diagnosis Date Noted   Altered mental status 11/12/2020   Hypokalemia 12/15/2019   Chronic constipation 12/15/2019   Acute urinary  retention 12/15/2019   Closed left hip fracture ([place]) 12/14/2019   Essential hypertension 01/20/2018   Acute CVA (cerebrovascular accident) ([place]) 10/16/2015   Left hand weakness 10/15/2015   Ptosis 12/22/2012   Weakness 12/22/2012   Primary hyperparathyroidism ([place]) 08/05/2012   Chest pain 03/18/2010   CONJUNCTIVITIS, ACUTE 04/26/2008   MEMBRANOUS GLOMERULONEPHRITIS 04/26/2008   SJOGREN'S SYNDROME 04/26/2008   ABDOMINAL PAIN, CHRONIC 04/26/2008   NONSPECIFIC ABNORMAL UNSPEC CV FUNCTION STUDY 04/26/2008    Past Surgical History:  Procedure Laterality Date   APPENDECTOMY  2002   HIP PINNING,CANNULATED Left 12/15/2019   Procedure: CANNULATED HIP PINNING;  Surgeon: Shona Needles, MD;  Location: [place];  Service: Orthopedics;  Laterality: Left;   LAPAROSCOPIC CHOLECYSTECTOMY  2002   TONSILLECTOMY  1941   TOTAL ABDOMINAL HYSTERECTOMY W/ BILATERAL SALPINGOOPHORECTOMY  2005   VAGINAL HYSTERECTOMY  1981     OB History   No obstetric history on file.     Family History  Problem Relation Age of Onset   Heart disease Other    Anxiety disorder Other    Depression Other     Social History   Tobacco Use   Smoking status: Never   Smokeless tobacco: Never  Substance Use Topics   Alcohol use: No    Alcohol/week: 0.0 standard drinks  Home Medications Prior to Admission medications   Medication Sig Start Date End Date Taking? Authorizing Provider  Ascorbic Acid (VITAMIN C) 1000 MG tablet Take 1,000 mg by mouth daily.    [provider]  Cholecalciferol (VITAMIN D-3 PO) Take 1 tablet by mouth daily.    [provider]  fluconazole (DIFLUCAN) 100 MG tablet Take 100 mg by mouth daily. 10/19/20   [provider]  hydrochlorothiazide (MICROZIDE) 12.5 MG capsule TAKE 1 CAPSULE BY MOUTH ONCE DAILY PATIENT  NEEDS  APPOINTMENT  FOR  FURTHER  REFILLS Patient not taking: Reported on 11/01/2020 04/11/19   Arnoldo Lenis, MD  HYDROcodone-acetaminophen  (NORCO/VICODIN) 5-325 MG tablet Take 1 tablet by mouth every 6 (six) hours as needed for severe pain. Patient not taking: Reported on 11/01/2020 12/16/19   Delray Alt, PA-C  ketoconazole (NIZORAL) 2 % cream Apply topically 3 (three) times daily. 10/19/20   [provider]  loratadine (CLARITIN) 10 MG tablet Take 10 mg by mouth daily.    [provider]  Misc. Devices (3-IN-1 BEDSIDE TOILET) MISC Patient needs 3 in 1 bedside toilet. Recent hip fracture with repair 12/17/19   Oretha Milch D, MD  Multiple Vitamin (MULTIVITAMIN) tablet Take 1 tablet by mouth daily.    [provider]  ondansetron (ZOFRAN) 4 MG tablet Take 1 tablet (4 mg total) by mouth every 6 (six) hours as needed for nausea or vomiting. Patient not taking: Reported on 11/01/2020 12/16/19   Delray Alt, PA-C  pantoprazole (PROTONIX) 40 MG tablet Take 40 mg by mouth 2 (two) times daily.     [provider]  polyethylene glycol powder (GLYCOLAX/MIRALAX) 17 GM/SCOOP powder Take 17 g by mouth daily.      [provider]  potassium chloride (KLOR-CON) 10 MEQ tablet Take 10 mEq by mouth daily. Patient not taking: Reported on 11/01/2020 10/30/19   [provider]  triamcinolone cream (KENALOG) 0.1 % Apply 1 application topically 3 (three) times daily. Patient not taking: Reported on 11/01/2020 12/06/19   [provider]  Vitamin E 400 units TABS Take 400 Units by mouth daily. Patient not taking: Reported on 11/01/2020    [provider]  zinc gluconate 50 MG tablet Take 50 mg by mouth daily.    [provider]    Allergies    Atorvastatin, Cellcept [mycophenolate mofetil], Medrol [methylprednisolone], Mycophenolate mofetil, Penicillins, Sulfonamide derivatives, Topiramate, Verapamil, and Levofloxacin  Review of Systems   Review of Systems  Constitutional:  Negative for fever.  HENT:  Negative for ear pain and sore throat.   Eyes:  Negative for visual  disturbance.  Respiratory:  Negative for cough and shortness of breath.   Cardiovascular:  Negative for chest pain.  Gastrointestinal:  Negative for abdominal pain, constipation, diarrhea, nausea and vomiting.  Genitourinary:  Negative for dysuria and hematuria.  Musculoskeletal:  Negative for back pain.  Skin:  Negative for color change and rash.  Neurological:  Negative for syncope and headaches.  Psychiatric/Behavioral:  Positive for hallucinations.   All other systems reviewed and are negative.  Physical Exam Updated Vital Signs BP (!) 185/79   Pulse 72   Temp 98.4 F (36.9 C) (Oral)   Resp 19   Ht 5' (1.524 m)   Wt 49.4 kg   SpO2 98%   BMI 21.29 kg/m   Physical Exam Vitals and nursing note reviewed.  Constitutional:      General: She is not in acute distress.  Appearance: She is well-developed.  HENT:     Head: Normocephalic and atraumatic.  Eyes:     Conjunctiva/sclera: Conjunctivae normal.  Cardiovascular:     Rate and Rhythm: Normal rate and regular rhythm.     Heart sounds: Normal heart sounds. No murmur heard. Pulmonary:     Effort: Pulmonary effort is normal. No respiratory distress.     Breath sounds: Normal breath sounds. No wheezing, rhonchi or rales.  Abdominal:     General: Bowel sounds are normal.     Palpations: Abdomen is soft.     Tenderness: There is abdominal tenderness (mild suprapubic ttp). There is no guarding or rebound.  Musculoskeletal:     Cervical back: Neck supple.  Skin:    General: Skin is warm and dry.  Neurological:     Mental Status: She is alert.     Comments: Mental Status:  Alert, oriented x3 but does appear confused. Speech fluent without evidence of aphasia. Able to follow 2 step commands without difficulty.  Cranial Nerves:  II:  pupils equal, round, reactive to light III,IV, VI: ptosis not present, extra-ocular motions intact bilaterally  V,VII: smile symmetric, facial light touch sensation equal VIII: hearing grossly  normal to voice  X: uvula elevates symmetrically  XI: bilateral shoulder shrug symmetric and strong XII: midline tongue extension without fassiculations Motor:  Normal tone. 5/5 strength of BUE and BLE major muscle groups including strong and equal grip strength and dorsiflexion/plantar flexion DTRs: biceps and achilles 2+ symmetric b/l Cerebellar: normal finger-to-nose with bilateral upper extremities     ED Results / Procedures / Treatments   Labs (all labs ordered are listed, but only abnormal results are displayed) Labs Reviewed  COMPREHENSIVE METABOLIC PANEL - Abnormal; Notable for the following components:      Result Value   Calcium 11.3 (*)    All other components within normal limits  URINALYSIS, ROUTINE W REFLEX MICROSCOPIC - Abnormal; Notable for the following components:   Color, Urine STRAW (*)    Ketones, ur 5 (*)    All other components within normal limits  RESP PANEL BY RT-PCR (FLU A&B, COVID) [id number]  CBC WITH DIFFERENTIAL/PLATELET  RAPID URINE DRUG SCREEN, HOSP PERFORMED  ETHANOL  CBG MONITORING, ED    EKG EKG Interpretation  Date/Time:  Monday November 12 2020 16:21:48 EDT Ventricular Rate:  75 PR Interval:  191 QRS Duration: 129 QT Interval:  393 QTC Calculation: 439 R Axis:   -73 Text Interpretation: Sinus rhythm Probable left atrial enlargement Left bundle branch block since last tracing no significant change Confirmed by Daleen Bo [phone]) on 11/12/2020 4:45:35 PM  Radiology CT Head Wo Contrast  Result Date: 11/12/2020 CLINICAL DATA:  Delirium.  Confusion. EXAM: CT HEAD WITHOUT CONTRAST TECHNIQUE: Contiguous axial images were obtained from the base of the skull through the vertex without intravenous contrast. COMPARISON:  Head CT 10/15/2015 FINDINGS: Brain: Stable degree of atrophy there is moderate chronic small vessel ischemia with progression from 2017. no intracranial hemorrhage, mass effect, or midline shift. No hydrocephalus. The basilar  cisterns are patent. No evidence of territorial infarct or acute ischemia. No extra-axial or intracranial fluid collection. Vascular: Atherosclerosis of skullbase vasculature without hyperdense vessel or abnormal calcification. Skull: No fracture or focal lesion. Sinuses/Orbits: Minor mucosal thickening of ethmoid air cells. No sinus fluid levels. Hypoplastic right mastoid air cells. No significant mastoid effusion. Unremarkable orbits. Other: None. IMPRESSION: 1. No acute intracranial abnormality. 2. Generalized atrophy and chronic small vessel ischemia. Electronically Signed  By: Keith Rake M.D.   On: 11/12/2020 18:06   DG Chest Portable 1 View  Result Date: 11/12/2020 CLINICAL DATA:  Altered mental status EXAM: PORTABLE CHEST 1 VIEW COMPARISON:  12/14/2019 FINDINGS: The heart size and mediastinal contours are within normal limits. Both lungs are clear. Metallic device overlies the left perihilar region, likely external to the patient. The visualized skeletal structures are unremarkable. IMPRESSION: No active disease. Electronically Signed   By: Fidela Salisbury MD   On: 11/12/2020 18:28    Procedures Procedures   Medications Ordered in ED Medications - No data to display  ED Course  I have reviewed the triage vital signs and the nursing notes.  Pertinent labs & imaging results that were available during my care of the patient were reviewed by me and considered in my medical decision making (see chart for details).    MDM Rules/Calculators/A&P                          87 y/o F presents to the ED for eval of AMS  Reviewed/interpreted labs CBC is without leukocytosis or anemia CMP is unremarkable UA neg for uti CBG neg  EKG - Sinus rhythm Probable left atrial enlargement Left bundle branch block since last tracing no significant change   Reviewed/interpreted imaging CT head - 1. No acute intracranial abnormality. 2. Generalized atrophy and chronic small vessel ischemia.  CXR -  No active disease  Patient here with altered mental status which seems consistent with delirium.  Unclear etiology at this time.  Does not appear to be any sort of infectious source that we can identify.  She does not appear to have had a stroke on CT head.  UDS and EtOH are pending at the time of admission.  7:33 PM CONSULT with Dr. Josephine Cables who accepts patient for admission  Final Clinical Impression(s) / ED Diagnoses Final diagnoses:  Altered mental status, unspecified altered mental status type    Rx / DC Orders ED Discharge Orders     None        Bishop Dublin 11/12/20 2010    Daleen Bo, MD 11/13/20 0006

## 2020-11-13 ENCOUNTER — Observation Stay (HOSPITAL_COMMUNITY): Payer: Medicare Other

## 2020-11-13 ENCOUNTER — Other Ambulatory Visit (HOSPITAL_COMMUNITY): Payer: Self-pay | Admitting: *Deleted

## 2020-11-13 ENCOUNTER — Inpatient Hospital Stay (HOSPITAL_COMMUNITY): Payer: Medicare Other

## 2020-11-13 DIAGNOSIS — Z79899 Other long term (current) drug therapy: Secondary | ICD-10-CM | POA: Diagnosis not present

## 2020-11-13 DIAGNOSIS — I6340 Cerebral infarction due to embolism of unspecified cerebral artery: Secondary | ICD-10-CM | POA: Diagnosis present

## 2020-11-13 DIAGNOSIS — K219 Gastro-esophageal reflux disease without esophagitis: Secondary | ICD-10-CM | POA: Diagnosis present

## 2020-11-13 DIAGNOSIS — R9431 Abnormal electrocardiogram [ECG] [EKG]: Secondary | ICD-10-CM

## 2020-11-13 DIAGNOSIS — I480 Paroxysmal atrial fibrillation: Secondary | ICD-10-CM | POA: Diagnosis present

## 2020-11-13 DIAGNOSIS — I10 Essential (primary) hypertension: Secondary | ICD-10-CM | POA: Diagnosis present

## 2020-11-13 DIAGNOSIS — Z818 Family history of other mental and behavioral disorders: Secondary | ICD-10-CM | POA: Diagnosis not present

## 2020-11-13 DIAGNOSIS — I639 Cerebral infarction, unspecified: Secondary | ICD-10-CM | POA: Diagnosis not present

## 2020-11-13 DIAGNOSIS — R55 Syncope and collapse: Secondary | ICD-10-CM | POA: Diagnosis not present

## 2020-11-13 DIAGNOSIS — Z90710 Acquired absence of both cervix and uterus: Secondary | ICD-10-CM | POA: Diagnosis not present

## 2020-11-13 DIAGNOSIS — K5909 Other constipation: Secondary | ICD-10-CM | POA: Diagnosis present

## 2020-11-13 DIAGNOSIS — Z880 Allergy status to penicillin: Secondary | ICD-10-CM | POA: Diagnosis not present

## 2020-11-13 DIAGNOSIS — G9341 Metabolic encephalopathy: Secondary | ICD-10-CM | POA: Diagnosis present

## 2020-11-13 DIAGNOSIS — Z20822 Contact with and (suspected) exposure to covid-19: Secondary | ICD-10-CM | POA: Diagnosis present

## 2020-11-13 DIAGNOSIS — Z90722 Acquired absence of ovaries, bilateral: Secondary | ICD-10-CM | POA: Diagnosis not present

## 2020-11-13 DIAGNOSIS — Z87440 Personal history of urinary (tract) infections: Secondary | ICD-10-CM | POA: Diagnosis not present

## 2020-11-13 DIAGNOSIS — Z9049 Acquired absence of other specified parts of digestive tract: Secondary | ICD-10-CM | POA: Diagnosis not present

## 2020-11-13 DIAGNOSIS — R29705 NIHSS score 5: Secondary | ICD-10-CM | POA: Diagnosis present

## 2020-11-13 DIAGNOSIS — E7800 Pure hypercholesterolemia, unspecified: Secondary | ICD-10-CM | POA: Diagnosis present

## 2020-11-13 DIAGNOSIS — M3500 Sicca syndrome, unspecified: Secondary | ICD-10-CM | POA: Diagnosis present

## 2020-11-13 DIAGNOSIS — R4182 Altered mental status, unspecified: Secondary | ICD-10-CM | POA: Diagnosis present

## 2020-11-13 DIAGNOSIS — I959 Hypotension, unspecified: Secondary | ICD-10-CM | POA: Diagnosis not present

## 2020-11-13 DIAGNOSIS — Z888 Allergy status to other drugs, medicaments and biological substances status: Secondary | ICD-10-CM | POA: Diagnosis not present

## 2020-11-13 DIAGNOSIS — E876 Hypokalemia: Secondary | ICD-10-CM | POA: Diagnosis present

## 2020-11-13 LAB — ECHOCARDIOGRAM COMPLETE
Area-P 1/2: 5.88 cm2
Height: 60 in
P 1/2 time: 359 msec
S' Lateral: 2.29 cm
Weight: 1744 oz

## 2020-11-13 LAB — COMPREHENSIVE METABOLIC PANEL
ALT: 17 U/L (ref 0–44)
AST: 23 U/L (ref 15–41)
Albumin: 3.7 g/dL (ref 3.5–5.0)
Alkaline Phosphatase: 54 U/L (ref 38–126)
Anion gap: 8 (ref 5–15)
BUN: 15 mg/dL (ref 8–23)
CO2: 27 mmol/L (ref 22–32)
Calcium: 10.6 mg/dL — ABNORMAL HIGH (ref 8.9–10.3)
Chloride: 102 mmol/L (ref 98–111)
Creatinine, Ser: 0.79 mg/dL (ref 0.44–1.00)
GFR, Estimated: >60 mL/min (ref >60)
Glucose, Bld: 119 mg/dL — ABNORMAL HIGH (ref 70–99)
Potassium: 3.3 mmol/L — ABNORMAL LOW (ref 3.5–5.1)
Sodium: 137 mmol/L (ref 135–145)
Total Bilirubin: 0.9 mg/dL (ref 0.3–1.2)
Total Protein: 6.5 g/dL (ref 6.5–8.1)

## 2020-11-13 LAB — PROTIME-INR
INR: 1 (ref 0.8–1.2)
Prothrombin Time: 13.6 seconds (ref 11.4–15.2)

## 2020-11-13 LAB — CBC
HCT: 38.5 % (ref 36.0–46.0)
Hemoglobin: 13 g/dL (ref 12.0–15.0)
MCH: 32.7 pg (ref 26.0–34.0)
MCHC: 33.8 g/dL (ref 30.0–36.0)
MCV: 97 fL (ref 80.0–100.0)
Platelets: 218 10*3/uL (ref 150–400)
RBC: 3.97 MIL/uL (ref 3.87–5.11)
RDW: 12.2 % (ref 11.5–15.5)
WBC: 6.8 10*3/uL (ref 4.0–10.5)
nRBC: 0 % (ref 0.0–0.2)

## 2020-11-13 LAB — VITAMIN B12: Vitamin B-12: 655 pg/mL (ref 180–914)

## 2020-11-13 LAB — PHOSPHORUS: Phosphorus: 2.1 mg/dL — ABNORMAL LOW (ref 2.5–4.6)

## 2020-11-13 LAB — FOLATE: Folate: 36.5 ng/mL (ref >5.9)

## 2020-11-13 LAB — MAGNESIUM: Magnesium: 2.2 mg/dL (ref 1.7–2.4)

## 2020-11-13 LAB — TSH: TSH: 1.103 u[IU]/mL (ref 0.350–4.500)

## 2020-11-13 LAB — AMMONIA: Ammonia: 35 umol/L (ref 9–35)

## 2020-11-13 LAB — APTT: aPTT: 30 seconds (ref 24–36)

## 2020-11-13 LAB — CORTISOL-AM, BLOOD: Cortisol - AM: 50.3 ug/dL — ABNORMAL HIGH (ref 6.7–22.6)

## 2020-11-13 MED ORDER — ASPIRIN 325 MG PO TABS: 325.0000 mg | ORAL_TABLET | Freq: Every day | ORAL | Status: DC

## 2020-11-13 MED ORDER — POTASSIUM PHOSPHATES 15 MMOLE/5ML IV SOLN
20.0000 mmol | Freq: Once | INTRAVENOUS | Status: AC
  Administered 2020-11-13: 20 mmol via INTRAVENOUS
  Filled 2020-11-13 (×2): qty 6.67

## 2020-11-13 MED ORDER — ASPIRIN 325 MG PO TABS
325.0000 mg | ORAL_TABLET | Freq: Once | ORAL | Status: AC
  Administered 2020-11-13: 325 mg via ORAL
  Filled 2020-11-13: qty 1

## 2020-11-13 MED ORDER — K PHOS MONO-SOD PHOS DI & MONO 155-852-130 MG PO TABS
250.0000 mg | ORAL_TABLET | Freq: Three times a day (TID) | ORAL | Status: DC
  Administered 2020-11-13 (×2): 250 mg via ORAL
  Filled 2020-11-13 (×2): qty 1

## 2020-11-13 MED ORDER — LORAZEPAM 2 MG/ML IJ SOLN
1.0000 mg | Freq: Once | INTRAMUSCULAR | Status: AC
  Administered 2020-11-13: 1 mg via INTRAVENOUS
  Filled 2020-11-13: qty 1

## 2020-11-13 MED ORDER — CLOPIDOGREL BISULFATE 75 MG PO TABS
75.0000 mg | ORAL_TABLET | Freq: Every day | ORAL | Status: DC
  Administered 2020-11-13 – 2020-11-19 (×7): 75 mg via ORAL
  Filled 2020-11-13 (×7): qty 1

## 2020-11-13 MED ORDER — POTASSIUM CHLORIDE CRYS ER 20 MEQ PO TBCR
40.0000 meq | EXTENDED_RELEASE_TABLET | ORAL | Status: AC
  Administered 2020-11-13 (×2): 40 meq via ORAL
  Filled 2020-11-13 (×2): qty 2

## 2020-11-13 MED ORDER — SIMVASTATIN 20 MG PO TABS: 20.0000 mg | ORAL_TABLET | Freq: Every day | ORAL | Status: DC

## 2020-11-13 MED ORDER — EZETIMIBE 10 MG PO TABS
10.0000 mg | ORAL_TABLET | Freq: Every day | ORAL | Status: DC
  Administered 2020-11-14 – 2020-11-18 (×5): 10 mg via ORAL
  Filled 2020-11-13 (×5): qty 1

## 2020-11-13 MED ORDER — IOHEXOL 350 MG/ML SOLN
100.0000 mL | Freq: Once | INTRAVENOUS | Status: AC | PRN
  Administered 2020-11-14: 100 mL via INTRAVENOUS

## 2020-11-13 MED ORDER — ASPIRIN EC 81 MG PO TBEC
81.0000 mg | DELAYED_RELEASE_TABLET | Freq: Every day | ORAL | Status: DC
  Administered 2020-11-14 – 2020-11-19 (×6): 81 mg via ORAL
  Filled 2020-11-13 (×6): qty 1

## 2020-11-13 MED ORDER — SIMVASTATIN 20 MG PO TABS
20.0000 mg | ORAL_TABLET | Freq: Every day | ORAL | Status: DC
  Administered 2020-11-14 – 2020-11-18 (×5): 20 mg via ORAL
  Filled 2020-11-13 (×5): qty 1

## 2020-11-13 MED ORDER — ROSUVASTATIN CALCIUM 20 MG PO TABS
20.0000 mg | ORAL_TABLET | Freq: Once | ORAL | Status: AC
  Administered 2020-11-13: 20 mg via ORAL
  Filled 2020-11-13: qty 1

## 2020-11-13 MED ORDER — SODIUM CHLORIDE 0.9 % IV SOLN: INTRAVENOUS | Status: DC

## 2020-11-13 NOTE — Progress Notes (Signed)
  Echocardiogram 2D Echocardiogram has been performed.  Matilde Bash 11/13/2020, 9:55 AM

## 2020-11-13 NOTE — Progress Notes (Signed)
PT Cancellation Note  Patient Details Name: Peggy Ramirez MRN: [id number] DOB: [birth year]   Cancelled Treatment:    Reason Eval/Treat Not Completed: Fatigue/lethargy limiting ability to participate. Pt asleep, snoring and doesn't to arouse to voice. Pt's family at bedside, states pt lives alone, uses [facility] or [facility], grocery shops, denies falls at baseline. Will check back as schedule permits for PT eval.   Tori Kem Parcher PT, DPT 11/13/20, 2:16 PM

## 2020-11-13 NOTE — Progress Notes (Signed)
Rapid response was called around 11:58 PM (7/18).  I went to bedside, rapid team was also at bedside.  Apparently, patient had a vasovagal episode after returning from the restroom.  Fortunately, nurse tech was next to her and held her so she did not fall to the floor.  Patient did not lose consciousness, she was quickly placed in bed and was placed in a Trendelenburg position.  She quickly returned to her baseline within a few minutes.  IV hydration was provided. We shall continue to monitor patient and treat accordingly

## 2020-11-13 NOTE — Consult Note (Signed)
[place] A. Merlene Laughter, MD     [email or web address]          Peggy Ramirez is an 87 y.o. female.   ASSESSMENT/PLAN: Bilateral small infarcts which raises the possibility of cardioembolic phenomena. The 2 infarcts seems to be of varying chronology. If cardioembolic, most likely etiology is from paroxysmal atrial fibrillation. Consequently, 30 day event monitor is recommended. TEE is unlikely to be fruitful at this age and in this situation. Dual antiplatelet agents are recommended for 1 month. Additional imaging with CT of the head and neck is also recommended. Other risk factor modifications includes blood pressure control and use of statin medication. Marked encephalopathy which is mostly unexplained. The couple small strokes noted seems unlikely to cause the encephalopathy. If persist consider EEG. All the labs have been unrevealing at this time to explain the encephalopathy. The associated hallucination raises the possibility of Lewy body dementia which may be considered as the etiology if she has persistent hallucinations.      Patient presents with significant alteration of mentation. Workup has been positive for small subcortical infarcts. The patient presents with not only confusion and disorientation but hallucinations. She is quite confused and unresponsive in still unable to provide a history.  GENERAL:  She lays in bed with eyes closed mostly unresponsive.  HEENT:  Neck is supple no trauma noted.  ABDOMEN: soft  EXTREMITIES: No edema   BACK: Normal  SKIN: Normal by inspection.    MENTAL STATUS: She lays in bed with eyes closed. She does not open her eyes but does a respond to deep painful stimuli. She is mostly uncooperative with evaluation. She does not follow commands. She states a few words that are nonsensical  CRANIAL NERVES: Pupils are equal, round and reactive to light; extra ocular movements are full, there is no significant nystagmus; visual  fields are full; upper and lower facial muscles are normal in strength and symmetric, there is no flattening of the nasolabial folds.   MOTOR:  She has antigravity strength symmetrically on both sides. Bulk and tone are unremarkable.  COORDINATION:  No tremors are noted. No dysmetria no myoclonus.  REFLEXES: Deep tendon reflexes are symmetrical and normal.   SENSATION: Normal to pain.     Blood pressure (!) 143/58, pulse 72, temperature 98.6 F (37 C), resp. rate 16, height 5' (1.524 m), weight 49.4 kg, SpO2 95 %.  Past Medical History:  Diagnosis Date   Chronic constipation    Chronic left lower quadrant pain    Headache(784.0)    High cholesterol    Hydrosalpinx    LEFT   Hypertension    Lupus ([place])    Membranous glomerulonephritis    followed by Dr. Elige Radon   Ovarian fibroma    RIGHT   Pelvic adhesions    Sjogren's syndrome ([place])    Stroke [facility])     Past Surgical History:  Procedure Laterality Date   APPENDECTOMY  04/28/2000   FRACTURE SURGERY     HIP PINNING,CANNULATED Left 12/15/2019   Procedure: CANNULATED HIP PINNING;  Surgeon: Shona Needles, MD;  Location: [place];  Service: Orthopedics;  Laterality: Left;   LAPAROSCOPIC CHOLECYSTECTOMY  04/28/2000   TONSILLECTOMY  04/29/1939   TOTAL ABDOMINAL HYSTERECTOMY W/ BILATERAL SALPINGOOPHORECTOMY  04/29/2003   VAGINAL HYSTERECTOMY  04/29/1979    Family History  Problem Relation Age of Onset   Heart disease Other    Anxiety disorder Other    Depression Other     Social  History:  reports that she has never smoked. She has never used smokeless tobacco. She reports that she does not drink alcohol and does not use drugs.  Allergies:  Allergies  Allergen Reactions   Atorvastatin Other (See Comments)    Muscle aches   Cellcept [Mycophenolate Mofetil] Other (See Comments)    unknown   Medrol [Methylprednisolone] Other (See Comments)    unknown   Mycophenolate Mofetil Other (See Comments)    unknown    Penicillins Itching   Sulfonamide Derivatives Nausea Only   Topiramate Other (See Comments)    unknown   Verapamil Other (See Comments)    Felt shakey   Levofloxacin Rash    Medications: Prior to Admission medications   Medication Sig Start Date End Date Taking? Authorizing Provider  Ascorbic Acid (VITAMIN C) 1000 MG tablet Take 1,000 mg by mouth daily.   Yes [provider]  aspirin EC 81 MG tablet Take 81 mg by mouth daily. Swallow whole.   Yes [provider]  carboxymethylcellulose 1 % ophthalmic solution Apply 1 drop to eye daily as needed.   Yes [provider]  fluconazole (DIFLUCAN) 100 MG tablet Take 100 mg by mouth daily. 10/19/20  Yes [provider]  loratadine (CLARITIN) 10 MG tablet Take 10 mg by mouth daily.   Yes [provider]  Multiple Vitamin (MULTIVITAMIN) tablet Take 1 tablet by mouth daily.   Yes [provider]  pantoprazole (PROTONIX) 40 MG tablet Take 40 mg by mouth daily as needed.   Yes [provider]  polyethylene glycol powder (GLYCOLAX/MIRALAX) 17 GM/SCOOP powder Take 17 g by mouth daily.     Yes [provider]  zinc gluconate 50 MG tablet Take 50 mg by mouth daily.   Yes [provider]  Cholecalciferol (VITAMIN D-3 PO) Take 1 tablet by mouth daily. Patient not taking: Reported on 11/12/2020    [provider]  hydrochlorothiazide (MICROZIDE) 12.5 MG capsule TAKE 1 CAPSULE BY MOUTH ONCE DAILY PATIENT  NEEDS  APPOINTMENT  FOR  FURTHER  REFILLS Patient not taking: Reported on 11/01/2020 04/11/19   Arnoldo Lenis, MD  HYDROcodone-acetaminophen (NORCO/VICODIN) 5-325 MG tablet Take 1 tablet by mouth every 6 (six) hours as needed for severe pain. Patient not taking: No sig reported 12/16/19   Delray Alt, PA-C  ketoconazole (NIZORAL) 2 % cream Apply topically 3 (three) times daily. Patient not taking: Reported on 11/12/2020 10/19/20   [provider]  Misc.  Devices (3-IN-1 BEDSIDE TOILET) MISC Patient needs 3 in 1 bedside toilet. Recent hip fracture with repair 12/17/19   Oretha Milch D, MD  ondansetron (ZOFRAN) 4 MG tablet Take 1 tablet (4 mg total) by mouth every 6 (six) hours as needed for nausea or vomiting. Patient not taking: No sig reported 12/16/19   Delray Alt, PA-C  potassium chloride (KLOR-CON) 10 MEQ tablet Take 10 mEq by mouth daily. Patient not taking: Reported on 11/01/2020 10/30/19   [provider]  triamcinolone cream (KENALOG) 0.1 % Apply 1 application topically 3 (three) times daily. Patient not taking: Reported on 11/01/2020 12/06/19   [provider]  Vitamin E 400 units TABS Take 400 Units by mouth daily. Patient not taking: Reported on 11/01/2020    [provider]    Scheduled Meds:  [START ON 11/14/2020] aspirin EC  81 mg Oral Q breakfast   clopidogrel  75 mg Oral Daily   enoxaparin (LOVENOX) injection  40 mg Subcutaneous Q24H   [  START ON 11/14/2020] ezetimibe  10 mg Oral q1800   phosphorus  250 mg Oral TID   polyethylene glycol  17 g Oral Daily   [START ON 11/14/2020] simvastatin  20 mg Oral q1800   Continuous Infusions:  sodium chloride     PRN Meds:.acetaminophen, pantoprazole     Results for orders placed or performed during the hospital encounter of 11/12/20 (from the past 48 hour(s))  POC CBG, ED     Status: None   Collection Time: 11/12/20  3:49 PM  Result Value Ref Range   Glucose-Capillary 78 70 - 99 mg/dL    Comment: Glucose reference range applies only to samples taken after fasting for at least 8 hours.  Urinalysis, Routine w reflex microscopic Urine, Clean Catch     Status: Abnormal   Collection Time: 11/12/20  4:02 PM  Result Value Ref Range   Color, Urine STRAW (A) YELLOW   APPearance CLEAR CLEAR   Specific Gravity, Urine 1.008 1.005 - 1.030   pH 7.0 5.0 - 8.0   Glucose, UA NEGATIVE NEGATIVE mg/dL   Hgb urine dipstick NEGATIVE NEGATIVE   Bilirubin Urine NEGATIVE  NEGATIVE   Ketones, ur 5 (A) NEGATIVE mg/dL   Protein, ur NEGATIVE NEGATIVE mg/dL   Nitrite NEGATIVE NEGATIVE   Leukocytes,Ua NEGATIVE NEGATIVE    Comment: Performed at [facility], [address], [place], [place] [address]  Rapid urine drug screen (hospital performed)     Status: None   Collection Time: 11/12/20  4:02 PM  Result Value Ref Range   Opiates NONE DETECTED NONE DETECTED   Cocaine NONE DETECTED NONE DETECTED   Benzodiazepines NONE DETECTED NONE DETECTED   Amphetamines NONE DETECTED NONE DETECTED   Tetrahydrocannabinol NONE DETECTED NONE DETECTED   Barbiturates NONE DETECTED NONE DETECTED    Comment: (NOTE) DRUG SCREEN FOR MEDICAL PURPOSES ONLY.  IF CONFIRMATION IS NEEDED FOR ANY PURPOSE, NOTIFY LAB WITHIN 5 DAYS.  LOWEST DETECTABLE LIMITS FOR URINE DRUG SCREEN Drug Class                     Cutoff (ng/mL) Amphetamine and metabolites    1000 Barbiturate and metabolites    200 Benzodiazepine                 010 Tricyclics and metabolites     300 Opiates and metabolites        300 Cocaine and metabolites        300 THC                            50 Performed at [place] [place], [place] [address]   Comprehensive metabolic panel     Status: Abnormal   Collection Time: 11/12/20  4:15 PM  Result Value Ref Range   Sodium 137 135 - 145 mmol/L   Potassium 3.8 3.5 - 5.1 mmol/L   Chloride 102 98 - 111 mmol/L   CO2 27 22 - 32 mmol/L   Glucose, Bld 92 70 - 99 mg/dL    Comment: Glucose reference range applies only to samples taken after fasting for at least 8 hours.   BUN 13 8 - 23 mg/dL   Creatinine, Ser 0.69 0.44 - 1.00 mg/dL   Calcium 11.3 (H) 8.9 - 10.3 mg/dL   Total Protein 7.6 6.5 - 8.1 g/dL   Albumin 4.4 3.5 - 5.0 g/dL   AST 26 15 - 41 U/L  ALT 21 0 - 44 U/L   Alkaline Phosphatase 66 38 - 126 U/L   Total Bilirubin 0.7 0.3 - 1.2 mg/dL   GFR, Estimated >60 >60 mL/min    Comment: (NOTE) Calculated using the CKD-EPI Creatinine Equation  (2021)    Anion gap 8 5 - 15    Comment: Performed at [facility], [address] [place], [place] [address]  CBC with Differential     Status: None   Collection Time: 11/12/20  4:15 PM  Result Value Ref Range   WBC 4.0 4.0 - 10.5 K/uL   RBC 4.28 3.87 - 5.11 MIL/uL   Hemoglobin 14.0 12.0 - 15.0 g/dL   HCT 41.8 36.0 - 46.0 %   MCV 97.7 80.0 - 100.0 fL   MCH 32.7 26.0 - 34.0 pg   MCHC 33.5 30.0 - 36.0 g/dL   RDW 12.3 11.5 - 15.5 %   Platelets 229 150 - 400 K/uL   nRBC 0.0 0.0 - 0.2 %   Neutrophils Relative % 61 %   Neutro Abs 2.4 1.7 - 7.7 K/uL   Lymphocytes Relative 21 %   Lymphs Abs 0.8 0.7 - 4.0 K/uL   Monocytes Relative 13 %   Monocytes Absolute 0.5 0.1 - 1.0 K/uL   Eosinophils Relative 4 %   Eosinophils Absolute 0.1 0.0 - 0.5 K/uL   Basophils Relative 1 %   Basophils Absolute 0.0 0.0 - 0.1 K/uL   Immature Granulocytes 0 %   Abs Immature Granulocytes 0.01 0.00 - 0.07 K/uL    Comment: Performed at [facility], [address], [place], [place] [address]  Resp Panel by RT-PCR (Flu A&B, Covid) Nasopharyngeal Swab     Status: None   Collection Time: 11/12/20  7:27 PM   Specimen: Nasopharyngeal Swab; Nasopharyngeal(NP) swabs in vial transport medium  Result Value Ref Range   SARS Coronavirus 2 by RT PCR NEGATIVE NEGATIVE    Comment: (NOTE) SARS-CoV-2 target nucleic acids are NOT DETECTED.  The SARS-CoV-2 RNA is generally detectable in upper respiratory specimens during the acute phase of infection. The lowest concentration of SARS-CoV-2 viral copies this assay can detect is 138 copies/mL. A negative result does not preclude SARS-Cov-2 infection and should not be used as the sole basis for treatment or other patient management decisions. A negative result may occur with  improper specimen collection/handling, submission of specimen other than nasopharyngeal swab, presence of viral mutation(s) within the areas targeted by this assay, and inadequate number of  viral copies(<138 copies/mL). A negative result must be combined with clinical observations, patient history, and epidemiological information. The expected result is Negative.  Fact Sheet for Patients:  EntrepreneurPulse.com.au  Fact Sheet for Healthcare Providers:  IncredibleEmployment.be  This test is no t yet approved or cleared by the [place] FDA and  has been authorized for detection and/or diagnosis of SARS-CoV-2 by FDA under an Emergency Use Authorization (EUA). This EUA will remain  in effect (meaning this test can be used) for the duration of the COVID-19 declaration under Section 564(b)(1) of the Act, 21 U.S.C.section 360bbb-3(b)(1), unless the authorization is terminated  or revoked sooner.       Influenza A by PCR NEGATIVE NEGATIVE   Influenza B by PCR NEGATIVE NEGATIVE    Comment: (NOTE) The Xpert Xpress SARS-CoV-2/FLU/RSV plus assay is intended as an aid in the diagnosis of influenza from Nasopharyngeal swab specimens and should not be used as a sole basis for treatment. Nasal washings and aspirates are unacceptable for  Xpert Xpress SARS-CoV-2/FLU/RSV testing.  Fact Sheet for Patients: EntrepreneurPulse.com.au  Fact Sheet for Healthcare Providers: IncredibleEmployment.be  This test is not yet approved or cleared by the [place] FDA and has been authorized for detection and/or diagnosis of SARS-CoV-2 by FDA under an Emergency Use Authorization (EUA). This EUA will remain in effect (meaning this test can be used) for the duration of the COVID-19 declaration under Section 564(b)(1) of the Act, 21 U.S.C. section 360bbb-3(b)(1), unless the authorization is terminated or revoked.  Performed at [facility], [address] [place], [place] [address]   Ethanol     Status: None   Collection Time: 11/12/20  7:29 PM  Result Value Ref Range   Alcohol, Ethyl (B) <10 <10 mg/dL     Comment: (NOTE) Lowest detectable limit for serum alcohol is 10 mg/dL.  For medical purposes only. Performed at [facility], [address] [place], [place] [address]   Glucose, capillary     Status: Abnormal   Collection Time: 11/12/20 11:56 PM  Result Value Ref Range   Glucose-Capillary 176 (H) 70 - 99 mg/dL    Comment: Glucose reference range applies only to samples taken after fasting for at least 8 hours.  Comprehensive metabolic panel     Status: Abnormal   Collection Time: 11/13/20  4:11 AM  Result Value Ref Range   Sodium 137 135 - 145 mmol/L   Potassium 3.3 (L) 3.5 - 5.1 mmol/L   Chloride 102 98 - 111 mmol/L   CO2 27 22 - 32 mmol/L   Glucose, Bld 119 (H) 70 - 99 mg/dL    Comment: Glucose reference range applies only to samples taken after fasting for at least 8 hours.   BUN 15 8 - 23 mg/dL   Creatinine, Ser 0.79 0.44 - 1.00 mg/dL   Calcium 10.6 (H) 8.9 - 10.3 mg/dL   Total Protein 6.5 6.5 - 8.1 g/dL   Albumin 3.7 3.5 - 5.0 g/dL   AST 23 15 - 41 U/L   ALT 17 0 - 44 U/L   Alkaline Phosphatase 54 38 - 126 U/L   Total Bilirubin 0.9 0.3 - 1.2 mg/dL   GFR, Estimated >60 >60 mL/min    Comment: (NOTE) Calculated using the CKD-EPI Creatinine Equation (2021)    Anion gap 8 5 - 15    Comment: Performed at [facility], [address], [place], [place] [address]  CBC     Status: None   Collection Time: 11/13/20  4:11 AM  Result Value Ref Range   WBC 6.8 4.0 - 10.5 K/uL   RBC 3.97 3.87 - 5.11 MIL/uL   Hemoglobin 13.0 12.0 - 15.0 g/dL   HCT 38.5 36.0 - 46.0 %   MCV 97.0 80.0 - 100.0 fL   MCH 32.7 26.0 - 34.0 pg   MCHC 33.8 30.0 - 36.0 g/dL   RDW 12.2 11.5 - 15.5 %   Platelets 218 150 - 400 K/uL   nRBC 0.0 0.0 - 0.2 %    Comment: Performed at [facility], [address] [place], [place] [address]  Protime-INR     Status: None   Collection Time: 11/13/20  4:11 AM  Result Value Ref Range   Prothrombin Time 13.6 11.4 - 15.2 seconds   INR 1.0 0.8 - 1.2    Comment:  (NOTE) INR goal varies based on device and disease states. Performed at [facility], [address], [place], [place] [address]   APTT     Status:  None   Collection Time: 11/13/20  4:11 AM  Result Value Ref Range   aPTT 30 24 - 36 seconds    Comment: Performed at [facility], [address], [place], [place] [address]  Magnesium     Status: None   Collection Time: 11/13/20  4:11 AM  Result Value Ref Range   Magnesium 2.2 1.7 - 2.4 mg/dL    Comment: Performed at [facility], [address], [place], [place] [address]  Phosphorus     Status: Abnormal   Collection Time: 11/13/20  4:11 AM  Result Value Ref Range   Phosphorus 2.1 (L) 2.5 - 4.6 mg/dL    Comment: Performed at [facility], [address], [place], [place] [address]  Cortisol-am, blood     Status: Abnormal   Collection Time: 11/13/20  4:11 AM  Result Value Ref Range   Cortisol - AM 50.3 (H) 6.7 - 22.6 ug/dL    Comment: Performed at [facility], [place] [address] [place], [place] [address]  TSH     Status: None   Collection Time: 11/13/20  4:11 AM  Result Value Ref Range   TSH 1.103 0.350 - 4.500 uIU/mL    Comment: Performed by a 3rd Generation assay with a functional sensitivity of <=0.01 uIU/mL. Performed at [facility], [address], [place], [place] [address]   Vitamin B12     Status: None   Collection Time: 11/13/20  4:11 AM  Result Value Ref Range   Vitamin B-12 655 180 - 914 pg/mL    Comment: (NOTE) This assay is not validated for testing neonatal or myeloproliferative syndrome specimens for Vitamin B12 levels. Performed at [facility], [address] [place], [place] [address]   Folate     Status: None   Collection Time: 11/13/20  4:11 AM  Result Value Ref Range   Folate 36.5 >5.9 ng/mL    Comment: RESULTS CONFIRMED BY MANUAL DILUTION Performed at [facility], [address] [place], [place] [address]   Ammonia     Status: None   Collection Time: 11/13/20 10:21 AM  Result Value Ref Range    Ammonia 35 9 - 35 umol/L    Comment: Performed at [facility], [address], [place], [place] [address]    Studies/Results: TTE  1. Left ventricular ejection fraction, by estimation, is 60 to 65%. The  left ventricle has normal function. The left ventricle has no regional  wall motion abnormalities. There is mild left ventricular hypertrophy.  Left ventricular diastolic parameters  are indeterminate.   2. Right ventricular systolic function is normal. The right ventricular  size is normal. There is normal pulmonary artery systolic pressure. The  estimated right ventricular systolic pressure is 46.2 mmHg.   3. There is a trivial pericardial effusion that is circumferential.   4. The mitral valve is grossly normal. No evidence of mitral valve  regurgitation.   5. The aortic valve is tricuspid. There is mild calcification of the  aortic valve. Aortic valve regurgitation is trivial.   6. The inferior vena cava is normal in size with greater than 50%  respiratory variability, suggesting right atrial pressure of 3 mmHg.      MRI MRA BRAIN FINDINGS: MRI HEAD FINDINGS   Brain: Acute infarct in the right frontal white matter (series 5, image 23). Additional small mild area of restricted diffusion in the left frontal cortex (series 5, image 21). No significant edema or mass effect. No acute hemorrhage. Additional faint  focus of DWI hyperintensity in the more anterior left frontal cortex (series 5, image 24) is associated with an area of susceptibility artifact and favored to represent artifact from mineralization when correlating with prior CT head. No evidence of acute hemorrhage, hydrocephalus, mass lesion, midline shift, extra-axial fluid collection. On susceptibility weighted imaging there is subtle sulcal susceptibility artifact along the bifrontal convexities, suggestive of superficial siderosis. Moderate scattered T2/FLAIR hyperintensities within the white matter, nonspecific  but most likely related to chronic microvascular ischemic disease. Mild for age atrophy.   Vascular: See below.   Skull and upper cervical spine: Normal marrow signal.   Sinuses/Orbits: Mild ethmoid air cell mucosal thickening. Unremarkable orbits.   Other: Small bilateral mastoid effusions.   MRA HEAD FINDINGS   Anterior circulation: Bilateral ICAs, MCAs, in the ACAs are patent. Similar mild moderate stenosis of the left MCA origin. Suspected at least moderate stenosis of the right M1 MCA, which may be accentuated by motion and downward flow on this noncontrast MRA.   Posterior circulation: Bilateral vertebral arteries, basilar artery, and posterior cerebral arteries are patent. Suspected at least moderate right vertebral artery stenosis. Bilateral PCAs are patent with likely mild right PCA stenosis.   IMPRESSION: MRI:   1. 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 Possible additional small acute infarct in the left frontal cortex. No significant edema or mass effect. Given potential involvement of multiple vascular territories, consider a central embolic etiology. 2. 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 3. Moderate chronic microvascular disease and mild atrophy.   MRA:   1. Motion limited evaluation with suspected at least moderate stenosis of the right M1 MCA and right intradural vertebral artery. 2. Mild-to-moderate left M1 MCA stenosis.     The brain MRI is reviewed in person and shows 2 small deep frontal infarcts involving the left and right side on DWI.  No hemorrhages noted.  There is moderate leukoencephalopathy consistent with chronic microvascular changes.       [place]ver Bentson A. Merlene Laughter, M.D.  Diplomate, Tax adviser of Psychiatry and Neurology ( Neurology). 11/13/2020, 7:18 PM

## 2020-11-13 NOTE — Progress Notes (Signed)
Patient Demographics:    Peggy Ramirez, is a 87 y.o. female, DOB - [birth year], OMV:[id number]  Admit date - 11/12/2020   Admitting Physician Shontel Santee Denton Brick, MD  Outpatient Primary MD for the patient is Sasser, Silvestre Moment, MD  LOS - 0   Chief Complaint  Patient presents with   Altered Mental Status        Subjective:    Peggy Ramirez today has no fevers, no emesis,  No chest pain,   -Patient's son Peggy Ramirez at bedside -She was awake  earlier , psychosis and confusional episodes persisted all night  -she received IV Ativan for sedation prior to MRI brain and became very sleepy but stable  Assessment  & Plan :    Principal Problem:   Acute CVA (cerebrovascular accident) [facility]) Active Problems:   Acute metabolic encephalopathy   Essential hypertension   Chronic constipation   Altered mental status   GERD (gastroesophageal reflux disease)  Brief Summary:- 87 y.o. female with medical history significant for Sjogren's, lupus, hypertension, hyperlipidemia, GERD, recurrent UTI and chronic constipation admitted on 11/12/2020 with acute strokes resulting in acute metabolic encephalopathy/psychosis  A/p 1)Acute CVA--- MRI brain and MRA Head on 11/13/2020 showed acute infarct in the right frontal white matter. Possible additional small acute infarct in the left frontal cortex. No significant edema or mass effect. Given potential involvement of multiple vascular territories, consider a central embolic etiology. --TTE with preserved EF of 60 to 65%,  Awaiting official consult from Dr. Merlene Laughter the neurologist, defer to neurologist if patient needs TEE -PT, OT and speech pathology consult requested -Get CTA neck -Aspirin, Plavix and statin as ordered -TSH 1.1  2)acute metabolic encephalopathy/psychosis--- due to #1 above, manage as above -Serum ammonia WNL, folate, B12 and TSH WNL  3)H/o HTN-- hypotensive  episode----a.m. cortisol is not low, maintain adequate hydration -Avoid hypotension, -Allow permissive hypertension  4)Hypokalemia/hypophosphatemia --- replace Kcl, replace phosphorous, magnesium is WNL  5)GERD--continue Protonix 40 mg daily  Disposition/Need for in-Hospital Stay- patient unable to be discharged at this time due to acute stroke and electrolyte abnormalities requiring neuro consult, PT OT speech eval as well as replacement of electrolytes- -- Anticipate patient will need SNF rehab prior to returning home  Status is: Inpatient  Remains inpatient appropriate because: See disposition above  Disposition: The patient is from: Home              Anticipated d/c is to: SNF              Anticipated d/c date is: 2 days              Patient currently is not medically stable to d/c. Barriers: Not Clinically Stable-   Code Status :  -  Code Status: Full Code   Family Communication:    Discussed with son and son Peggy Ramirez at bedside  Consults  :  Neurology  DVT Prophylaxis  :   - SCDs   enoxaparin (LOVENOX) injection 40 mg Start: 11/12/20 2145 SCDs Start: 11/12/20 2046  Lab Results  Component Value Date   PLT 218 11/13/2020    Inpatient Medications  Scheduled Meds:  [START ON 11/14/2020] aspirin EC  81 mg Oral Q breakfast   clopidogrel  75 mg Oral Daily  enoxaparin (LOVENOX) injection  40 mg Subcutaneous Q24H   [START ON 11/14/2020] ezetimibe  10 mg Oral q1800   phosphorus  250 mg Oral TID   polyethylene glycol  17 g Oral Daily   [START ON 11/14/2020] simvastatin  20 mg Oral q1800   Continuous Infusions:  sodium chloride     PRN Meds:.acetaminophen, pantoprazole  Anti-infectives (From admission, onward)    None         Objective:   Vitals:   11/13/20 0024 11/13/20 1043 11/13/20 1428 11/13/20 1433  BP: (!) 84/42 132/71 130/72 (!) 143/58  Pulse: 67 86 86 72  Resp:   18 16  Temp:   98.6 F (37 C)   TempSrc:      SpO2: 98%  98% 95%  Weight:      Height:         Wt Readings from Last 3 Encounters:  11/12/20 49.4 kg  11/01/20 49.7 kg  12/17/19 50.5 kg   Intake/Output Summary (Last 24 hours) at 11/13/2020 1924 Last data filed at 11/13/2020 1730 Gross per 24 hour  Intake 480 ml  Output --  Net 480 ml   Physical Exam  Gen:- Awake Alert,  in no apparent distress  HEENT:- [place].AT, No sclera icterus Neck-Supple Neck,No JVD,.  Lungs-  CTAB , fair symmetrical air movement CV- S1, S2 normal, regular  Abd-  +ve B.Sounds, Abd Soft, No tenderness,    Extremity/Skin:- No  edema, pedal pulses present  Psych-visual and auditory hallucinations/psychosis, confusion and disorientation persist  neuro-global weakness, generalized weakness, no new focal deficits, no tremors   Data Review:   Micro Results Recent Results (from the past 240 hour(s))  Resp Panel by RT-PCR (Flu A&B, Covid) Nasopharyngeal Swab     Status: None   Collection Time: 11/12/20  7:27 PM   Specimen: Nasopharyngeal Swab; Nasopharyngeal(NP) swabs in vial transport medium  Result Value Ref Range Status   SARS Coronavirus 2 by RT PCR NEGATIVE NEGATIVE Final    Comment: (NOTE) SARS-CoV-2 target nucleic acids are NOT DETECTED.  The SARS-CoV-2 RNA is generally detectable in upper respiratory specimens during the acute phase of infection. The lowest concentration of SARS-CoV-2 viral copies this assay can detect is 138 copies/mL. A negative result does not preclude SARS-Cov-2 infection and should not be used as the sole basis for treatment or other patient management decisions. A negative result may occur with  improper specimen collection/handling, submission of specimen other than nasopharyngeal swab, presence of viral mutation(s) within the areas targeted by this assay, and inadequate number of viral copies(<138 copies/mL). A negative result must be combined with clinical observations, patient history, and epidemiological information. The expected result is Negative.  Fact Sheet  for Patients:  EntrepreneurPulse.com.au  Fact Sheet for Healthcare Providers:  IncredibleEmployment.be  This test is no t yet approved or cleared by the [place] FDA and  has been authorized for detection and/or diagnosis of SARS-CoV-2 by FDA under an Emergency Use Authorization (EUA). This EUA will remain  in effect (meaning this test can be used) for the duration of the COVID-19 declaration under Section 564(b)(1) of the Act, 21 U.S.C.section 360bbb-3(b)(1), unless the authorization is terminated  or revoked sooner.       Influenza A by PCR NEGATIVE NEGATIVE Final   Influenza B by PCR NEGATIVE NEGATIVE Final    Comment: (NOTE) The Xpert Xpress SARS-CoV-2/FLU/RSV plus assay is intended as an aid in the diagnosis of influenza from Nasopharyngeal swab specimens and should not be  used as a sole basis for treatment. Nasal washings and aspirates are unacceptable for Xpert Xpress SARS-CoV-2/FLU/RSV testing.  Fact Sheet for Patients: EntrepreneurPulse.com.au  Fact Sheet for Healthcare Providers: IncredibleEmployment.be  This test is not yet approved or cleared by the [place] FDA and has been authorized for detection and/or diagnosis of SARS-CoV-2 by FDA under an Emergency Use Authorization (EUA). This EUA will remain in effect (meaning this test can be used) for the duration of the COVID-19 declaration under Section 564(b)(1) of the Act, 21 U.S.C. section 360bbb-3(b)(1), unless the authorization is terminated or revoked.  Performed at [facility], [address] [place], [place] [address]     Radiology Reports CT Head Wo Contrast  Result Date: 11/12/2020 CLINICAL DATA:  Delirium.  Confusion. EXAM: CT HEAD WITHOUT CONTRAST TECHNIQUE: Contiguous axial images were obtained from the base of the skull through the vertex without intravenous contrast. COMPARISON:  Head CT 10/15/2015 FINDINGS: Brain:  Stable degree of atrophy there is moderate chronic small vessel ischemia with progression from 2017. no intracranial hemorrhage, mass effect, or midline shift. No hydrocephalus. The basilar cisterns are patent. No evidence of territorial infarct or acute ischemia. No extra-axial or intracranial fluid collection. Vascular: Atherosclerosis of skullbase vasculature without hyperdense vessel or abnormal calcification. Skull: No fracture or focal lesion. Sinuses/Orbits: Minor mucosal thickening of ethmoid air cells. No sinus fluid levels. Hypoplastic right mastoid air cells. No significant mastoid effusion. Unremarkable orbits. Other: None. IMPRESSION: 1. No acute intracranial abnormality. 2. Generalized atrophy and chronic small vessel ischemia. Electronically Signed   By: Keith Rake M.D.   On: 11/12/2020 18:06   MR ANGIO HEAD WO CONTRAST  Result Date: 11/13/2020 CLINICAL DATA:  Mental status change.  Unknown cause. EXAM: MRI HEAD WITHOUT CONTRAST MRA HEAD WITHOUT CONTRAST TECHNIQUE: Multiplanar, multi-echo pulse sequences of the brain and surrounding structures were acquired without intravenous contrast. Angiographic images of the Circle of Willis were acquired using MRA technique without intravenous contrast. COMPARISON:  MRI/MRI October 24, 2015. CT head from yesterday. FINDINGS: MRI HEAD FINDINGS Brain: Acute infarct in the right frontal white matter (series 5, image 23). Additional small mild area of restricted diffusion in the left frontal cortex (series 5, image 21). No significant edema or mass effect. No acute hemorrhage. Additional faint focus of DWI hyperintensity in the more anterior left frontal cortex (series 5, image 24) is associated with an area of susceptibility artifact and favored to represent artifact from mineralization when correlating with prior CT head. No evidence of acute hemorrhage, hydrocephalus, mass lesion, midline shift, extra-axial fluid collection. On susceptibility weighted  imaging there is subtle sulcal susceptibility artifact along the bifrontal convexities, suggestive of superficial siderosis. Moderate scattered T2/FLAIR hyperintensities within the white matter, nonspecific but most likely related to chronic microvascular ischemic disease. Mild for age atrophy. Vascular: See below. Skull and upper cervical spine: Normal marrow signal. Sinuses/Orbits: Mild ethmoid air cell mucosal thickening. Unremarkable orbits. Other: Small bilateral mastoid effusions. MRA HEAD FINDINGS Anterior circulation: Bilateral ICAs, MCAs, in the ACAs are patent. Similar mild moderate stenosis of the left MCA origin. Suspected at least moderate stenosis of the right M1 MCA, which may be accentuated by motion and downward flow on this noncontrast MRA. Posterior circulation: Bilateral vertebral arteries, basilar artery, and posterior cerebral arteries are patent. Suspected at least moderate right vertebral artery stenosis. Bilateral PCAs are patent with likely mild right PCA stenosis. IMPRESSION: MRI: 1. 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 Possible additional small acute infarct in the left  frontal cortex. No significant edema or mass effect. Given potential involvement of multiple vascular territories, consider a central embolic etiology. 2. 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 3. Moderate chronic microvascular disease and mild atrophy. MRA: 1. Motion limited evaluation with suspected at least moderate stenosis of the right M1 MCA and right intradural vertebral artery. 2. Mild-to-moderate left M1 MCA stenosis. Electronically Signed   By: Margaretha Sheffield MD   On: 11/13/2020 12:09   MR BRAIN WO CONTRAST  Result Date: 11/13/2020 CLINICAL DATA:  Mental status change.  Unknown cause. EXAM: MRI HEAD WITHOUT CONTRAST MRA HEAD WITHOUT CONTRAST TECHNIQUE:  Multiplanar, multi-echo pulse sequences of the brain and surrounding structures were acquired without intravenous contrast. Angiographic images of the Circle of Willis were acquired using MRA technique without intravenous contrast. COMPARISON:  MRI/MRI October 24, 2015. CT head from yesterday. FINDINGS: MRI HEAD FINDINGS Brain: Acute infarct in the right frontal white matter (series 5, image 23). Additional small mild area of restricted diffusion in the left frontal cortex (series 5, image 21). No significant edema or mass effect. No acute hemorrhage. Additional faint focus of DWI hyperintensity in the more anterior left frontal cortex (series 5, image 24) is associated with an area of susceptibility artifact and favored to represent artifact from mineralization when correlating with prior CT head. No evidence of acute hemorrhage, hydrocephalus, mass lesion, midline shift, extra-axial fluid collection. On susceptibility weighted imaging there is subtle sulcal susceptibility artifact along the bifrontal convexities, suggestive of superficial siderosis. Moderate scattered T2/FLAIR hyperintensities within the white matter, nonspecific but most likely related to chronic microvascular ischemic disease. Mild for age atrophy. Vascular: See below. Skull and upper cervical spine: Normal marrow signal. Sinuses/Orbits: Mild ethmoid air cell mucosal thickening. Unremarkable orbits. Other: Small bilateral mastoid effusions. MRA HEAD FINDINGS Anterior circulation: Bilateral ICAs, MCAs, in the ACAs are patent. Similar mild moderate stenosis of the left MCA origin. Suspected at least moderate stenosis of the right M1 MCA, which may be accentuated by motion and downward flow on this noncontrast MRA. Posterior circulation: Bilateral vertebral arteries, basilar artery, and posterior cerebral arteries are patent. Suspected at least moderate right vertebral artery stenosis. Bilateral PCAs are patent with likely mild right PCA stenosis.  IMPRESSION: MRI: 1. 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 Possible additional small acute infarct in the left frontal cortex. No significant edema or mass effect. Given potential involvement of multiple vascular territories, consider a central embolic etiology. 2. 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 3. Moderate chronic microvascular disease and mild atrophy. MRA: 1. Motion limited evaluation with suspected at least moderate stenosis of the right M1 MCA and right intradural vertebral artery. 2. Mild-to-moderate left M1 MCA stenosis. Electronically Signed   By: Margaretha Sheffield MD   On: 11/13/2020 12:09   DG Chest Portable 1 View  Result Date: 11/12/2020 CLINICAL DATA:  Altered mental status EXAM: PORTABLE CHEST 1 VIEW COMPARISON:  12/14/2019 FINDINGS: The heart size and mediastinal contours are within normal limits. Both lungs are clear. Metallic device overlies the left perihilar region, likely external to the patient. The visualized skeletal structures are unremarkable. IMPRESSION: No active disease. Electronically Signed   By: Fidela Salisbury MD   On: 11/12/2020 18:28   ECHOCARDIOGRAM COMPLETE  Result Date: 11/13/2020    ECHOCARDIOGRAM  REPORT   Patient Name:   KHORI UNDERBERG Date of Exam: 11/13/2020 Medical Rec #:  [id number]        Height:       60.0 in Accession #:    [id number]       Weight:       109.0 lb Date of Birth:  10-May-1933        BSA:          1.442 m Patient Age:    35 years         BP:           84/42 mmHg Patient Gender: F                HR:           67 bpm. Exam Location:  [facility] Procedure: 2D Echo, Cardiac Doppler and Color Doppler Indications:    Abnormal EKG  History:        Patient has prior history of Echocardiogram examinations, most                 recent 10/16/2015. Arrythmias:Abnormal EKG, Signs/Symptoms:Very                  altered mental status; Risk Factors:Hypertension and                 Dyslipidemia.  Sonographer:    Dustin Flock RDCS Referring Phys: [place]  1. Left ventricular ejection fraction, by estimation, is 60 to 65%. The left ventricle has normal function. The left ventricle has no regional wall motion abnormalities. There is mild left ventricular hypertrophy. Left ventricular diastolic parameters are indeterminate.  2. Right ventricular systolic function is normal. The right ventricular size is normal. There is normal pulmonary artery systolic pressure. The estimated right ventricular systolic pressure is 16.9 mmHg.  3. There is a trivial pericardial effusion that is circumferential.  4. The mitral valve is grossly normal. No evidence of mitral valve regurgitation.  5. The aortic valve is tricuspid. There is mild calcification of the aortic valve. Aortic valve regurgitation is trivial.  6. The inferior vena cava is normal in size with greater than 50% respiratory variability, suggesting right atrial pressure of 3 mmHg. FINDINGS  Left Ventricle: Left ventricular ejection fraction, by estimation, is 60 to 65%. The left ventricle has normal function. The left ventricle has no regional wall motion abnormalities. The left ventricular internal cavity size was normal in size. There is  mild left ventricular hypertrophy. Abnormal (paradoxical) septal motion, consistent with left bundle branch block. Left ventricular diastolic parameters are indeterminate. Right Ventricle: The right ventricular size is normal. No increase in right ventricular wall thickness. Right ventricular systolic function is normal. There is normal pulmonary artery systolic pressure. The tricuspid regurgitant velocity is 2.13 m/s, and  with an assumed right atrial pressure of 3 mmHg, the estimated right ventricular systolic pressure is 45.0 mmHg. Left Atrium: Left atrial size was normal in size. Right Atrium: Right atrial  size was normal in size. Pericardium: Trivial pericardial effusion is present. The pericardial effusion is circumferential. Mitral Valve: The mitral valve is grossly normal. No evidence of mitral valve regurgitation. Tricuspid Valve: The tricuspid valve is grossly normal. Tricuspid valve regurgitation is trivial. Aortic Valve: The aortic valve is tricuspid. There is mild calcification of the aortic valve. Aortic valve regurgitation is trivial. Aortic regurgitation PHT measures 359 msec. Pulmonic Valve: The pulmonic valve was grossly normal. Pulmonic valve regurgitation is trivial.  Aorta: The aortic root is normal in size and structure. Venous: The inferior vena cava is normal in size with greater than 50% respiratory variability, suggesting right atrial pressure of 3 mmHg. IAS/Shunts: No atrial level shunt detected by color flow Doppler.  LEFT VENTRICLE PLAX 2D LVIDd:         3.43 cm  Diastology LVIDs:         2.29 cm  LV e' medial:    6.83 cm/s LV PW:         1.25 cm  LV E/e' medial:  12.1 LV IVS:        1.25 cm  LV e' lateral:   8.27 cm/s LVOT diam:     1.90 cm  LV E/e' lateral: 10.0 LV SV:         54 LV SV Index:   37 LVOT Area:     2.84 cm  RIGHT VENTRICLE RV S prime:     14.90 cm/s RVOT diam:      2.60 cm TAPSE (M-mode): 1.5 cm LEFT ATRIUM             Index       RIGHT ATRIUM           Index LA diam:        2.70 cm 1.87 cm/m  RA Area:     10.70 cm LA Vol (A2C):   27.1 ml 18.79 ml/m RA Volume:   22.50 ml  15.60 ml/m LA Vol (A4C):   31.0 ml 21.50 ml/m LA Biplane Vol: 31.4 ml 21.77 ml/m  AORTIC VALVE LVOT Vmax:   105.00 cm/s LVOT Vmean:  69.100 cm/s LVOT VTI:    0.189 m AI PHT:      359 msec  AORTA Ao Root diam: 2.70 cm MITRAL VALVE                TRICUSPID VALVE MV Area (PHT): 5.88 cm     TR Peak grad:   18.1 mmHg MV Decel Time: 129 msec     TR Vmax:        213.00 cm/s MV E velocity: 82.40 cm/s MV A velocity: 120.00 cm/s  SHUNTS MV E/A ratio:  0.69         Systemic VTI:  0.19 m                              Systemic Diam: 1.90 cm                             Pulmonic Diam: 2.60 cm Rozann Lesches MD Electronically signed by Rozann Lesches MD Signature Date/Time: 11/13/2020/12:21:12 PM    Final      CBC Recent Labs  Lab 11/12/20 1615 11/13/20 0411  WBC 4.0 6.8  HGB 14.0 13.0  HCT 41.8 38.5  PLT 229 218  MCV 97.7 97.0  MCH 32.7 32.7  MCHC 33.5 33.8  RDW 12.3 12.2  LYMPHSABS 0.8  --   MONOABS 0.5  --   EOSABS 0.1  --   BASOSABS 0.0  --     Chemistries  Recent Labs  Lab 11/12/20 1615 11/13/20 0411  NA 137 137  K 3.8 3.3*  CL 102 102  CO2 27 27  GLUCOSE 92 119*  BUN 13 15  CREATININE 0.69 0.79  CALCIUM 11.3* 10.6*  MG  --  2.2  AST 26 23  ALT 21 17  ALKPHOS 66 54  BILITOT 0.7 0.9   ------------------------------------------------------------------------------------------------------------------ No results for input(s): CHOL, HDL, LDLCALC, TRIG, CHOLHDL, LDLDIRECT in the last 72 hours.  Lab Results  Component Value Date   HGBA1C 5.2 10/15/2015   ------------------------------------------------------------------------------------------------------------------ Recent Labs    11/13/20 0411  TSH 1.103   ------------------------------------------------------------------------------------------------------------------ Recent Labs    11/13/20 0411  VITAMINB12 655  FOLATE 36.5    Coagulation profile Recent Labs  Lab 11/13/20 0411  INR 1.0    No results for input(s): DDIMER in the last 72 hours.  Cardiac Enzymes No results for input(s): CKMB, TROPONINI, MYOGLOBIN in the last 168 hours.  Invalid input(s): CK ------------------------------------------------------------------------------------------------------------------ No results found for: BNP   Roxan Hockey M.D on 11/13/2020 at 7:24 PM  Go to [email or web address] - for contact info  [facility] - Office  [phone]

## 2020-11-13 NOTE — Progress Notes (Signed)
SLP Cancellation Note  Patient Details Name: Peggy Ramirez MRN: [id number] DOB: 1934/03/05   Cancelled treatment:       Reason Eval/Treat Not Completed: Fatigue/lethargy limiting ability to participate (Pt was sedated for MRI earliert today and sleeping soundly). [name] and daughter in law were in room and stated that Pt had trouble swallowing the potassium pill earlier today, but that she typically has no trouble with her medications at home. SLP will check back later as schedule permits.  Thank you,  Genene Churn, [place]    [place] 11/13/2020, 2:05 PM

## 2020-11-13 NOTE — Progress Notes (Signed)
Rapid response called 0315  Pt had called NT to go to bathroom. Pt has previously been up walking in room prior to this request.  Pt had a an issue with being confused, thus the bed alarm had been on and pt directed to call to get OOB. While walking to bathroom, NT stated that pt reached out to grab wall. NT was behind pt and noticed her Sway, so she reached under pt's arms just as pt's legs "gave out". NT called out for help. Charge RN responded to room before this RN and the 2 were able to get pt sitting on BSC. When this RN arrived in room, NT was getting a set of VS. Pt's HR was in the 30's and BP was 55 systolic. This RN then called the Rapid response.  Pt had large BM before getting on [facility] and then more once on the [facility].  This RN along with 2 other NT's placed pt back in bed.  MD Josephine Cables) along with, ICU RN, AC, lab and RT responded.  MD gave this RN an order to start NS at 61ml/hr. No other orders were given.  Pt's HR rebounded into the 60's and BP up to the 90's.

## 2020-11-14 ENCOUNTER — Inpatient Hospital Stay (HOSPITAL_COMMUNITY): Payer: Medicare Other

## 2020-11-14 LAB — GLUCOSE, CAPILLARY
Glucose-Capillary: 101 mg/dL — ABNORMAL HIGH (ref 70–99)
Glucose-Capillary: 129 mg/dL — ABNORMAL HIGH (ref 70–99)
Glucose-Capillary: 139 mg/dL — ABNORMAL HIGH (ref 70–99)
Glucose-Capillary: 84 mg/dL (ref 70–99)
Glucose-Capillary: 90 mg/dL (ref 70–99)

## 2020-11-14 LAB — RENAL FUNCTION PANEL
Albumin: 3.6 g/dL (ref 3.5–5.0)
Anion gap: 7 (ref 5–15)
BUN: 13 mg/dL (ref 8–23)
CO2: 27 mmol/L (ref 22–32)
Calcium: 10.5 mg/dL — ABNORMAL HIGH (ref 8.9–10.3)
Chloride: 104 mmol/L (ref 98–111)
Creatinine, Ser: 0.75 mg/dL (ref 0.44–1.00)
GFR, Estimated: >60 mL/min (ref >60)
Glucose, Bld: 93 mg/dL (ref 70–99)
Phosphorus: 4.2 mg/dL (ref 2.5–4.6)
Potassium: 5 mmol/L (ref 3.5–5.1)
Sodium: 138 mmol/L (ref 135–145)

## 2020-11-14 LAB — T4, FREE: Free T4: 1.06 ng/dL (ref 0.61–1.12)

## 2020-11-14 LAB — CORTISOL: Cortisol, Plasma: 13.2 ug/dL

## 2020-11-14 MED ORDER — HALOPERIDOL LACTATE 5 MG/ML IJ SOLN
2.0000 mg | Freq: Four times a day (QID) | INTRAMUSCULAR | Status: DC | PRN
  Administered 2020-11-14 – 2020-11-15 (×2): 2 mg via INTRAVENOUS
  Filled 2020-11-14 (×2): qty 1

## 2020-11-14 MED ORDER — LORAZEPAM 1 MG PO TABS
1.0000 mg | ORAL_TABLET | Freq: Once | ORAL | Status: AC
  Administered 2020-11-14: 1 mg via ORAL
  Filled 2020-11-14: qty 1

## 2020-11-14 NOTE — Plan of Care (Signed)
  Problem: Education: Goal: Knowledge of disease or condition will improve Outcome: Progressing   

## 2020-11-14 NOTE — TOC Initial Note (Signed)
Transition of Care [facility]) - Initial/Assessment Note    Patient Details  Name: Peggy Ramirez MRN: [id number] Date of Birth: 1933-09-25  [facility] CM/SW Contact:    Shade Flood, LCSW Phone Number: 11/14/2020, 1:40 PM  Clinical Narrative:                  Pt admitted from home. PT/OT recommending CIR. Awaiting CIR determination. Spoke with pt's son and daughter to update. Pt lives alone at home. She has been independent in ADLs prior to admission. Family agreeable to CIR if accepted and approved by insurance. Discussed SNF rehab as a backup plan and family agreeable. Informed of CMS provider options and will refer as requested. If CIR is not an option for pt, TOC will start SNF insurance auth.  Assigned TOC will follow.  Expected Discharge Plan: IP Rehab Facility Barriers to Discharge: Continued Medical Work up, Ship broker   Patient Goals and CMS Choice Patient states their goals for this hospitalization and ongoing recovery are:: rehab CMS Medicare.gov Compare Post Acute Care list provided to:: Patient Represenative (must comment) Choice offered to / list presented to : Adult Children  Expected Discharge Plan and Services Expected Discharge Plan: [place] In-house Referral: Clinical Social Work   Post Acute Care Choice: IP Rehab Living arrangements for the past 2 months: Single Family Home                                      Prior Living Arrangements/Services Living arrangements for the past 2 months: Single Family Home Lives with:: Self Patient language and need for interpreter reviewed:: Yes Do you feel safe going back to the place where you live?: Yes      Need for Family Participation in Patient Care: Yes (Comment) Care giver support system in place?: Yes (comment)   Criminal Activity/Legal Involvement Pertinent to Current Situation/Hospitalization: No - Comment as needed  Activities of Daily Living Home Assistive  Devices/Equipment: Cane (specify quad or straight), Walker (specify type), Dentures (specify type), Eyeglasses, Bedside commode/3-in-1 ADL Screening (condition at time of admission) Patient's cognitive ability adequate to safely complete daily activities?: Yes Is the patient deaf or have difficulty hearing?: No Does the patient have difficulty seeing, even when wearing glasses/contacts?: No Does the patient have difficulty concentrating, remembering, or making decisions?: No Patient able to express need for assistance with ADLs?: Yes Does the patient have difficulty dressing or bathing?: No Independently performs ADLs?: Yes (appropriate for developmental age) Does the patient have difficulty walking or climbing stairs?: No Weakness of Legs: None Weakness of Arms/Hands: None  Permission Sought/Granted Permission sought to share information with : Facility Art therapist granted to share information with : Yes, Verbal Permission Granted     Permission granted to share info w AGENCY: [facility]        Emotional Assessment       Orientation: : Oriented to Self, Oriented to Place, Oriented to Situation Alcohol / Substance Use: Not Applicable Psych Involvement: No (comment)  Admission diagnosis:  Altered mental status [R41.82] Altered mental status, unspecified altered mental status type [R41.82] Acute CVA (cerebrovascular accident) [facility]) [I63.9] Patient Active Problem List   Diagnosis Date Noted   Acute metabolic encephalopathy 96/29/5284   Altered mental status 11/12/2020   GERD (gastroesophageal reflux disease) 11/12/2020   Hypokalemia 12/15/2019   Chronic constipation 12/15/2019   Acute urinary retention 12/15/2019  Closed left hip fracture ([place]) 12/14/2019   Essential hypertension 01/20/2018   Acute CVA (cerebrovascular accident) ([place]) 10/16/2015   Left hand weakness 10/15/2015   Ptosis 12/22/2012   Weakness 12/22/2012   Primary hyperparathyroidism ([place])  08/05/2012   Chest pain 03/18/2010   CONJUNCTIVITIS, ACUTE 04/26/2008   MEMBRANOUS GLOMERULONEPHRITIS 04/26/2008   SJOGREN'S SYNDROME 04/26/2008   ABDOMINAL PAIN, CHRONIC 04/26/2008   NONSPECIFIC ABNORMAL UNSPEC CV FUNCTION STUDY 04/26/2008   PCP:  Manon Hilding, MD Pharmacy:   [facility] [address] [place] [address] Phone: [phone] Fax: [phone]     Social Determinants of Health (SDOH) Interventions    Readmission Risk Interventions No flowsheet data found.

## 2020-11-14 NOTE — Progress Notes (Signed)
Notified Dr Roger Shelter of patient's anxiety and aggressive behavior since patient's son left. Also called patient's son.

## 2020-11-14 NOTE — Progress Notes (Signed)
MD notified that patient refused CT scan. Will try again in a.m.

## 2020-11-14 NOTE — Progress Notes (Signed)
Patient Demographics:    Peggy Ramirez, is a 87 y.o. female, DOB - Apr 01, 1934, CLE:[id number]  Admit date - 11/12/2020   Admitting Physician Courage Denton Brick, MD  Outpatient Primary MD for the patient is Sasser, Silvestre Moment, MD  LOS - 1   Chief Complaint  Patient presents with   Altered Mental Status        Subjective:    Peggy Ramirez was seen and examined this morning, much more awake, following some commands participating in PT, minimum verbal conversation  Patient apparently had a auditory visual hallucination yesterday which has improved today No signs of agitation or aggression   Assessment  & Plan :    Principal Problem:   Acute CVA (cerebrovascular accident) [facility]) Active Problems:   Essential hypertension   Chronic constipation   Altered mental status   GERD (gastroesophageal reflux disease)   Acute metabolic encephalopathy  Brief Summary:- 87 y.o. female with medical history significant for Sjogren's, lupus, hypertension, hyperlipidemia, GERD, recurrent UTI and chronic constipation admitted on 11/12/2020 with acute strokes resulting in acute metabolic encephalopathy/psychosis  A/p 1)Acute CVA--- MRI brain and MRA Head on 11/13/2020 showed acute infarct in the right frontal white matter.  - Possible additional small acute infarct in the left frontal cortex. No significant edema or mass effect. Given potential involvement of multiple vascular territories, consider a central embolic etiology. --TTE with preserved EF of 60 to 65%,  Awaiting official consult from Dr. Merlene Laughter the neurologist, defer to neurologist if patient needs TEE -PT, OT and speech pathology consult requested -Get CTA neck -pending as patient refused to get the study done yesterday -Continue aspirin, Plavix and statin as ordered -TSH 1.1  -Status post PT/OT evaluation recommending CIR -24-hour supervision for now steady  gait and confusion  2)acute metabolic encephalopathy/psychosis--- due to #1 above, manage as above -Serum ammonia WNL, folate, B12 and TSH WNL -Improving  3)H/o HTN-- hypotensive episode-- -Blood pressure has stabilized, 149/65 this a.m.  -cortisol is not low, maintain adequate hydration -Avoid hypotension, -Allow permissive hypertension  4)Hypokalemia/hypophosphatemia --- replace Kcl, replace phosphorous, magnesium is WNL  5)GERD--continue Protonix 40 mg daily  Disposition/Need for in-Hospital Stay- patient unable to be discharged at this time due to acute stroke and electrolyte abnormalities requiring neuro consult, PT OT speech eval as well as replacement of electrolytes- -- Anticipate patient will need CIR vs SNF rehab prior to returning home  Status is: Inpatient  Remains inpatient appropriate because: See disposition above  Disposition: The patient is from: Home              Anticipated d/c is to: SNF vs CIR              Anticipated d/c date is: 2 days              Patient currently is not medically stable to d/c. Barriers: Not Clinically Stable-   Code Status :  -  Code Status: Full Code   Family Communication:    Discussed with son and son [name] at bedside  Consults  :  Neurology  DVT Prophylaxis  :   - SCDs   enoxaparin (LOVENOX) injection 40 mg Start: 11/12/20 2145 SCDs Start: 11/12/20 2046  Lab Results  Component Value Date  PLT 218 11/13/2020    Inpatient Medications  Scheduled Meds:  aspirin EC  81 mg Oral Q breakfast   clopidogrel  75 mg Oral Daily   enoxaparin (LOVENOX) injection  40 mg Subcutaneous Q24H   ezetimibe  10 mg Oral q1800   polyethylene glycol  17 g Oral Daily   simvastatin  20 mg Oral q1800   Continuous Infusions:  sodium chloride     PRN Meds:.acetaminophen, pantoprazole  Anti-infectives (From admission, onward)    None         Objective:   Vitals:   11/13/20 1428 11/13/20 1433 11/13/20 2127 11/14/20 0514  BP: 130/72  (!) 143/58 (!) 144/70 (!) 149/65  Pulse: 86 72 72 66  Resp: 18 16 18 18   Temp: 98.6 F (37 C)  98.1 F (36.7 C) 98.4 F (36.9 C)  TempSrc:    Oral  SpO2: 98% 95% 99% 99%  Weight:      Height:        Wt Readings from Last 3 Encounters:  11/12/20 49.4 kg  11/01/20 49.7 kg  12/17/19 50.5 kg   Intake/Output Summary (Last 24 hours) at 11/14/2020 1113 Last data filed at 11/14/2020 0910 Gross per 24 hour  Intake 480 ml  Output --  Net 480 ml      Physical Exam:   General:  Much more awake, cooperative, minimal verbal conversation  HEENT:  Normocephalic, PERRL, otherwise with in Normal limits  Possible visual disturbance, difficulty difficult to assess as patient does not follow full commands --- few verbal understanding  Neuro:  CNII-XII intact. , normal motor and sensation, reflexes intact   Lungs:   Clear to auscultation BL, Respirations unlabored, no wheezes / crackles  Cardio:    S1/S2, RRR, No murmure, No Rubs or Gallops   Abdomen:   Soft, non-tender, bowel sounds active all four quadrants,  no guarding or peritoneal signs.  Muscular skeletal:  Global generalized weaknesses, with asymmetry Limited exam - in bed, able to move all 4 extremities, Normal strength,  2+ pulses,  symmetric, No pitting edema  Skin:  Dry, warm to touch, negative for any Rashes,  Wounds: Please see nursing documentation        Data Review:   Micro Results Recent Results (from the past 240 hour(s))  Resp Panel by RT-PCR (Flu A&B, Covid) Nasopharyngeal Swab     Status: None   Collection Time: 11/12/20  7:27 PM   Specimen: Nasopharyngeal Swab; Nasopharyngeal(NP) swabs in vial transport medium  Result Value Ref Range Status   SARS Coronavirus 2 by RT PCR NEGATIVE NEGATIVE Final    Comment: (NOTE) SARS-CoV-2 target nucleic acids are NOT DETECTED.  The SARS-CoV-2 RNA is generally detectable in upper respiratory specimens during the acute phase of infection. The lowest concentration of  SARS-CoV-2 viral copies this assay can detect is 138 copies/mL. A negative result does not preclude SARS-Cov-2 infection and should not be used as the sole basis for treatment or other patient management decisions. A negative result may occur with  improper specimen collection/handling, submission of specimen other than nasopharyngeal swab, presence of viral mutation(s) within the areas targeted by this assay, and inadequate number of viral copies(<138 copies/mL). A negative result must be combined with clinical observations, patient history, and epidemiological information. The expected result is Negative.  Fact Sheet for Patients:  EntrepreneurPulse.com.au  Fact Sheet for Healthcare Providers:  IncredibleEmployment.be  This test is no t yet approved or cleared by the [place] and  has been authorized for detection and/or diagnosis of SARS-CoV-2 by FDA under an Emergency Use Authorization (EUA). This EUA will remain  in effect (meaning this test can be used) for the duration of the COVID-19 declaration under Section 564(b)(1) of the Act, 21 U.S.C.section 360bbb-3(b)(1), unless the authorization is terminated  or revoked sooner.       Influenza A by PCR NEGATIVE NEGATIVE Final   Influenza B by PCR NEGATIVE NEGATIVE Final    Comment: (NOTE) The Xpert Xpress SARS-CoV-2/FLU/RSV plus assay is intended as an aid in the diagnosis of influenza from Nasopharyngeal swab specimens and should not be used as a sole basis for treatment. Nasal washings and aspirates are unacceptable for Xpert Xpress SARS-CoV-2/FLU/RSV testing.  Fact Sheet for Patients: EntrepreneurPulse.com.au  Fact Sheet for Healthcare Providers: IncredibleEmployment.be  This test is not yet approved or cleared by the [place] FDA and has been authorized for detection and/or diagnosis of SARS-CoV-2 by FDA under an Emergency Use  Authorization (EUA). This EUA will remain in effect (meaning this test can be used) for the duration of the COVID-19 declaration under Section 564(b)(1) of the Act, 21 U.S.C. section 360bbb-3(b)(1), unless the authorization is terminated or revoked.  Performed at [facility], [address], [place], [place] [address]     Radiology Reports CT Head Wo Contrast  Result Date: 11/12/2020 CLINICAL DATA:  Delirium.  Confusion. EXAM: CT HEAD WITHOUT CONTRAST TECHNIQUE: Contiguous axial images were obtained from the base of the skull through the vertex without intravenous contrast. COMPARISON:  Head CT 10/15/2015 FINDINGS: Brain: Stable degree of atrophy there is moderate chronic small vessel ischemia with progression from 2017. no intracranial hemorrhage, mass effect, or midline shift. No hydrocephalus. The basilar cisterns are patent. No evidence of territorial infarct or acute ischemia. No extra-axial or intracranial fluid collection. Vascular: Atherosclerosis of skullbase vasculature without hyperdense vessel or abnormal calcification. Skull: No fracture or focal lesion. Sinuses/Orbits: Minor mucosal thickening of ethmoid air cells. No sinus fluid levels. Hypoplastic right mastoid air cells. No significant mastoid effusion. Unremarkable orbits. Other: None. IMPRESSION: 1. No acute intracranial abnormality. 2. Generalized atrophy and chronic small vessel ischemia. Electronically Signed   By: Keith Rake M.D.   On: 11/12/2020 18:06   MR ANGIO HEAD WO CONTRAST  Result Date: 11/13/2020 CLINICAL DATA:  Mental status change.  Unknown cause. EXAM: MRI HEAD WITHOUT CONTRAST MRA HEAD WITHOUT CONTRAST TECHNIQUE: Multiplanar, multi-echo pulse sequences of the brain and surrounding structures were acquired without intravenous contrast. Angiographic images of the Circle of Willis were acquired using MRA technique without intravenous contrast. COMPARISON:  MRI/MRI October 24, 2015. CT head from yesterday. FINDINGS:  MRI HEAD FINDINGS Brain: Acute infarct in the right frontal white matter (series 5, image 23). Additional small mild area of restricted diffusion in the left frontal cortex (series 5, image 21). No significant edema or mass effect. No acute hemorrhage. Additional faint focus of DWI hyperintensity in the more anterior left frontal cortex (series 5, image 24) is associated with an area of susceptibility artifact and favored to represent artifact from mineralization when correlating with prior CT head. No evidence of acute hemorrhage, hydrocephalus, mass lesion, midline shift, extra-axial fluid collection. On susceptibility weighted imaging there is subtle sulcal susceptibility artifact along the bifrontal convexities, suggestive of superficial siderosis. Moderate scattered T2/FLAIR hyperintensities within the white matter, nonspecific but most likely related to chronic microvascular ischemic disease. Mild for age atrophy. Vascular: See below. Skull and upper cervical spine: Normal marrow signal. Sinuses/Orbits: Mild ethmoid  air cell mucosal thickening. Unremarkable orbits. Other: Small bilateral mastoid effusions. MRA HEAD FINDINGS Anterior circulation: Bilateral ICAs, MCAs, in the ACAs are patent. Similar mild moderate stenosis of the left MCA origin. Suspected at least moderate stenosis of the right M1 MCA, which may be accentuated by motion and downward flow on this noncontrast MRA. Posterior circulation: Bilateral vertebral arteries, basilar artery, and posterior cerebral arteries are patent. Suspected at least moderate right vertebral artery stenosis. Bilateral PCAs are patent with likely mild right PCA stenosis. IMPRESSION: MRI: 1. 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 Possible additional small acute infarct in the left frontal cortex. No significant edema or mass effect. Given potential involvement of multiple vascular territories, consider a central embolic etiology. 2. Suggestion of bifrontal  superficial siderosis on susceptibility weighting imaging, possibly the sequela of prior subarachnoid hemorrhage given no acute hemorrhage on recent CT head. 3. Moderate chronic microvascular disease and mild atrophy. MRA: 1. Motion limited evaluation with suspected at least moderate stenosis of the right M1 MCA and right intradural vertebral artery. 2. Mild-to-moderate left M1 MCA stenosis. Electronically Signed   By: Margaretha Sheffield MD   On: 11/13/2020 12:09   MR BRAIN WO CONTRAST  Result Date: 11/13/2020 CLINICAL DATA:  Mental status change.  Unknown cause. EXAM: MRI HEAD WITHOUT CONTRAST MRA HEAD WITHOUT CONTRAST TECHNIQUE: Multiplanar, multi-echo pulse sequences of the brain and surrounding structures were acquired without intravenous contrast. Angiographic images of the Circle of Willis were acquired using MRA technique without intravenous contrast. COMPARISON:  MRI/MRI October 24, 2015. CT head from yesterday. FINDINGS: MRI HEAD FINDINGS Brain: Acute infarct in the right frontal white matter (series 5, image 23). Additional small mild area of restricted diffusion in the left frontal cortex (series 5, image 21). No significant edema or mass effect. No acute hemorrhage. Additional faint focus of DWI hyperintensity in the more anterior left frontal cortex (series 5, image 24) is associated with an area of susceptibility artifact and favored to represent artifact from mineralization when correlating with prior CT head. No evidence of acute hemorrhage, hydrocephalus, mass lesion, midline shift, extra-axial fluid collection. On susceptibility weighted imaging there is subtle sulcal susceptibility artifact along the bifrontal convexities, suggestive of superficial siderosis. Moderate scattered T2/FLAIR hyperintensities within the white matter, nonspecific but most likely related to chronic microvascular ischemic disease. Mild for age atrophy. Vascular: See below. Skull and upper cervical spine: Normal marrow  signal. Sinuses/Orbits: Mild ethmoid air cell mucosal thickening. Unremarkable orbits. Other: Small bilateral mastoid effusions. MRA HEAD FINDINGS Anterior circulation: Bilateral ICAs, MCAs, in the ACAs are patent. Similar mild moderate stenosis of the left MCA origin. Suspected at least moderate stenosis of the right M1 MCA, which may be accentuated by motion and downward flow on this noncontrast MRA. Posterior circulation: Bilateral vertebral arteries, basilar artery, and posterior cerebral arteries are patent. Suspected at least moderate right vertebral artery stenosis. Bilateral PCAs are patent with likely mild right PCA stenosis. IMPRESSION: MRI: 1. 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 Possible additional small acute infarct in the left frontal cortex. No significant edema or mass effect. Given potential involvement of multiple vascular territories, consider a central embolic etiology. 2. 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 3. Moderate chronic microvascular disease and mild atrophy. MRA: 1. Motion limited evaluation with suspected at least moderate stenosis of the right M1 MCA and right intradural vertebral artery. 2. Mild-to-moderate  left M1 MCA stenosis. Electronically Signed   By: Margaretha Sheffield MD   On: 11/13/2020 12:09   DG Chest Portable 1 View  Result Date: 11/12/2020 CLINICAL DATA:  Altered mental status EXAM: PORTABLE CHEST 1 VIEW COMPARISON:  12/14/2019 FINDINGS: The heart size and mediastinal contours are within normal limits. Both lungs are clear. Metallic device overlies the left perihilar region, likely external to the patient. The visualized skeletal structures are unremarkable. IMPRESSION: No active disease. Electronically Signed   By: Fidela Salisbury MD   On: 11/12/2020 18:28   ECHOCARDIOGRAM COMPLETE  Result Date: 11/13/2020     ECHOCARDIOGRAM REPORT   Patient Name:   Peggy Ramirez Date of Exam: 11/13/2020 Medical Rec #:  [id number]        Height:       60.0 in Accession #:    [id number]       Weight:       109.0 lb Date of Birth:  01/13/1934        BSA:          1.442 m Patient Age:    82 years         BP:           84/42 mmHg Patient Gender: F                HR:           67 bpm. Exam Location:  [facility] Procedure: 2D Echo, Cardiac Doppler and Color Doppler Indications:    Abnormal EKG  History:        Patient has prior history of Echocardiogram examinations, most                 recent 10/16/2015. Arrythmias:Abnormal EKG, Signs/Symptoms:Very                 altered mental status; Risk Factors:Hypertension and                 Dyslipidemia.  Sonographer:    Dustin Flock RDCS Referring Phys: [place]  1. Left ventricular ejection fraction, by estimation, is 60 to 65%. The left ventricle has normal function. The left ventricle has no regional wall motion abnormalities. There is mild left ventricular hypertrophy. Left ventricular diastolic parameters are indeterminate.  2. Right ventricular systolic function is normal. The right ventricular size is normal. There is normal pulmonary artery systolic pressure. The estimated right ventricular systolic pressure is 75.6 mmHg.  3. There is a trivial pericardial effusion that is circumferential.  4. The mitral valve is grossly normal. No evidence of mitral valve regurgitation.  5. The aortic valve is tricuspid. There is mild calcification of the aortic valve. Aortic valve regurgitation is trivial.  6. The inferior vena cava is normal in size with greater than 50% respiratory variability, suggesting right atrial pressure of 3 mmHg. FINDINGS  Left Ventricle: Left ventricular ejection fraction, by estimation, is 60 to 65%. The left ventricle has normal function. The left ventricle has no regional wall motion abnormalities. The left ventricular internal cavity size was  normal in size. There is  mild left ventricular hypertrophy. Abnormal (paradoxical) septal motion, consistent with left bundle branch block. Left ventricular diastolic parameters are indeterminate. Right Ventricle: The right ventricular size is normal. No increase in right ventricular wall thickness. Right ventricular systolic function is normal. There is normal pulmonary artery systolic pressure. The tricuspid regurgitant velocity is 2.13 m/s, and  with an assumed right atrial pressure of  3 mmHg, the estimated right ventricular systolic pressure is 16.1 mmHg. Left Atrium: Left atrial size was normal in size. Right Atrium: Right atrial size was normal in size. Pericardium: Trivial pericardial effusion is present. The pericardial effusion is circumferential. Mitral Valve: The mitral valve is grossly normal. No evidence of mitral valve regurgitation. Tricuspid Valve: The tricuspid valve is grossly normal. Tricuspid valve regurgitation is trivial. Aortic Valve: The aortic valve is tricuspid. There is mild calcification of the aortic valve. Aortic valve regurgitation is trivial. Aortic regurgitation PHT measures 359 msec. Pulmonic Valve: The pulmonic valve was grossly normal. Pulmonic valve regurgitation is trivial. Aorta: The aortic root is normal in size and structure. Venous: The inferior vena cava is normal in size with greater than 50% respiratory variability, suggesting right atrial pressure of 3 mmHg. IAS/Shunts: No atrial level shunt detected by color flow Doppler.  LEFT VENTRICLE PLAX 2D LVIDd:         3.43 cm  Diastology LVIDs:         2.29 cm  LV e' medial:    6.83 cm/s LV PW:         1.25 cm  LV E/e' medial:  12.1 LV IVS:        1.25 cm  LV e' lateral:   8.27 cm/s LVOT diam:     1.90 cm  LV E/e' lateral: 10.0 LV SV:         54 LV SV Index:   37 LVOT Area:     2.84 cm  RIGHT VENTRICLE RV S prime:     14.90 cm/s RVOT diam:      2.60 cm TAPSE (M-mode): 1.5 cm LEFT ATRIUM             Index       RIGHT ATRIUM            Index LA diam:        2.70 cm 1.87 cm/m  RA Area:     10.70 cm LA Vol (A2C):   27.1 ml 18.79 ml/m RA Volume:   22.50 ml  15.60 ml/m LA Vol (A4C):   31.0 ml 21.50 ml/m LA Biplane Vol: 31.4 ml 21.77 ml/m  AORTIC VALVE LVOT Vmax:   105.00 cm/s LVOT Vmean:  69.100 cm/s LVOT VTI:    0.189 m AI PHT:      359 msec  AORTA Ao Root diam: 2.70 cm MITRAL VALVE                TRICUSPID VALVE MV Area (PHT): 5.88 cm     TR Peak grad:   18.1 mmHg MV Decel Time: 129 msec     TR Vmax:        213.00 cm/s MV E velocity: 82.40 cm/s MV A velocity: 120.00 cm/s  SHUNTS MV E/A ratio:  0.69         Systemic VTI:  0.19 m                             Systemic Diam: 1.90 cm                             Pulmonic Diam: 2.60 cm Rozann Lesches MD Electronically signed by Rozann Lesches MD Signature Date/Time: 11/13/2020/12:21:12 PM    Final      CBC Recent Labs  Lab 11/12/20 1615 11/13/20 0411  WBC 4.0 6.8  HGB  14.0 13.0  HCT 41.8 38.5  PLT 229 218  MCV 97.7 97.0  MCH 32.7 32.7  MCHC 33.5 33.8  RDW 12.3 12.2  LYMPHSABS 0.8  --   MONOABS 0.5  --   EOSABS 0.1  --   BASOSABS 0.0  --     Chemistries  Recent Labs  Lab 11/12/20 1615 11/13/20 0411 11/14/20 0520  NA 137 137 138  K 3.8 3.3* 5.0  CL 102 102 104  CO2 27 27 27   GLUCOSE 92 119* 93  BUN 13 15 13   CREATININE 0.69 0.79 0.75  CALCIUM 11.3* 10.6* 10.5*  MG  --  2.2  --   AST 26 23  --   ALT 21 17  --   ALKPHOS 66 54  --   BILITOT 0.7 0.9  --    ------------------------------------------------------------------------------------------------------------------ No results for input(s): CHOL, HDL, LDLCALC, TRIG, CHOLHDL, LDLDIRECT in the last 72 hours.  Lab Results  Component Value Date   HGBA1C 5.2 10/15/2015   ------------------------------------------------------------------------------------------------------------------ Recent Labs    11/13/20 0411  TSH 1.103    ------------------------------------------------------------------------------------------------------------------ Recent Labs    11/13/20 0411  VITAMINB12 655  FOLATE 36.5    Coagulation profile Recent Labs  Lab 11/13/20 0411  INR 1.0    No results for input(s): DDIMER in the last 72 hours.  Cardiac Enzymes No results for input(s): CKMB, TROPONINI, MYOGLOBIN in the last 168 hours.  Invalid input(s): CK ------------------------------------------------------------------------------------------------------------------ No results found for: BNP   Deatra James M.D on 11/14/2020 at 11:13 AM  Go to [email or web address] - for contact info  [facility] - Office  [phone]

## 2020-11-14 NOTE — Progress Notes (Signed)
Patient with confusion, family at bedside. Assisted patient to bathroom a couple of times today. Does well, but with some confusion and likes to "mess with things". Had pulled off all her telemetry leads, holding them in her hands asking what they were for. Re-secured patient's IV. She tolerated her diet. Vitals have been stable.

## 2020-11-14 NOTE — Plan of Care (Signed)
  Problem: Acute Rehab OT Goals (only OT should resolve) Goal: Pt. Will Perform Grooming Flowsheets (Taken 11/14/2020 1013) Pt Will Perform Grooming:  with modified independence  standing  with adaptive equipment Goal: Pt. Will Perform Lower Body Dressing Flowsheets (Taken 11/14/2020 1013) Pt Will Perform Lower Body Dressing:  with modified independence  sit to/from stand  with adaptive equipment Goal: Pt. Will Transfer To Toilet Flowsheets (Taken 11/14/2020 1013) Pt Will Transfer to Toilet:  with modified independence  ambulating Goal: Pt/Caregiver Will Perform Home Exercise Program Flowsheets (Taken 11/14/2020 1013) Pt/caregiver will Perform Home Exercise Program:  Increased strength  Both right and left upper extremity  With Supervision  Jazmene Racz OT, MOT

## 2020-11-14 NOTE — Evaluation (Signed)
Physical Therapy Evaluation Patient Details Name: Peggy Ramirez MRN: [id number] DOB: [birth year] Today's Date: 11/14/2020   History of Present Illness  Peggy Ramirez is a 87 y.o. female with medical history significant for Sjogren's, lupus, hypertension, hyperlipidemia, GERD, recurrent UTI and chronic constipation who presented to the emergency department accompanied by son due to 5-day onset of worsening altered mental status.  Most of the history was obtained from son at bedside, per son, patient lives alone and was capable of IADL, however, starting on Wednesday (7/13), she was noted to start to hallucinate occasionally by seeing things that were not there in the house.  Yesterday, she called son that there were people on her porch and that she was going to call the police, son went to her house and noted that no one was on the porch.  She also commented of a lot of dirt on the floor, however, the floor was not dirty.  Per son, patient was normally alert and oriented x3, though, she started to present with some memory difficulty within the last year to 2 years, but she never had any hallucinations.  Patient lives alone at home, she was usually very active with church activity prior to [place], she still goes to church now, but only Sundays and Thursdays.  She had 2 COVID vaccines and 2 boosters.  She never tested positive for COVID.  She was recently treated for UTI about 3 weeks ago, patient currently denies any irritative bladder symptoms.   Clinical Impression  Patient unsteady on feet and required use of single point cane [facility]) for safety.  Patient frequently drifting right/left with occasional stumbling without loss of balance, requires occasional verbal cues for safety and proper use of cane, limited mostly due to fatigue and tolerated sitting up in chair after therapy - RN aware.  Patient will benefit from continued physical therapy in hospital and recommended venue below to increase strength,  balance, endurance for safe ADLs and gait.     Follow Up Recommendations CIR;Supervision for mobility/OOB;Supervision - Intermittent    Equipment Recommendations  None recommended by PT    Recommendations for Other Services       Precautions / Restrictions Precautions Precautions: Fall Restrictions Weight Bearing Restrictions: No      Mobility  Bed Mobility Overal bed mobility: Modified Independent                  Transfers Overall transfer level: Needs assistance Equipment used: Straight cane Transfers: Sit to/from Stand;Stand Pivot Transfers Sit to Stand: Min guard;Min assist Stand pivot transfers: Min guard;Min assist       General transfer comment: unsteady labored movement, required use of SPC for safety  Ambulation/Gait Ambulation/Gait assistance: Min guard;Min assist Gait Distance (Feet): 80 Feet Assistive device: Straight cane Gait Pattern/deviations: Decreased step length - right;Decreased step length - left;Decreased stride length;Drifts right/left Gait velocity: decreased   General Gait Details: unsteady labored cadence with occasional near loss of balance and frequent drifting left/right using SPC  Stairs            Wheelchair Mobility    Modified Rankin (Stroke Patients Only)       Balance Overall balance assessment: Needs assistance Sitting-balance support: Feet supported;No upper extremity supported Sitting balance-Leahy Scale: Good Sitting balance - Comments: EOB   Standing balance support: During functional activity;No upper extremity supported Standing balance-Leahy Scale: Poor Standing balance comment: fair/poor without AD, fair using SPC  Pertinent Vitals/Pain Pain Assessment: No/denies pain    Home Living Family/patient expects to be discharged to:: Private residence Living Arrangements: Alone Available Help at Discharge: Family;Friend(s);Available PRN/intermittently Type of  Home: House Home Access: Stairs to enter Entrance Stairs-Rails: None Entrance Stairs-Number of Steps: 1 Home Layout: One level Home Equipment: Walker - 2 wheels;Bedside commode;Cane - single point Additional Comments: Pt reported family lives near her.    Prior Function Level of Independence: Needs assistance   Gait / Transfers Assistance Needed: household and short distanced community ambulator without AD, occasionally uses quad cane, drives, "per patient"  ADL's / Homemaking Assistance Needed: Independent, "per patient"        Hand Dominance   Dominant Hand: Right    Extremity/Trunk Assessment   Upper Extremity Assessment Upper Extremity Assessment: Defer to OT evaluation    Lower Extremity Assessment Lower Extremity Assessment: Generalized weakness    Cervical / Trunk Assessment Cervical / Trunk Assessment: Normal  Communication   Communication: HOH  Cognition Arousal/Alertness: Awake/alert Behavior During Therapy: WFL for tasks assessed/performed Overall Cognitive Status: No family/caregiver present to determine baseline cognitive functioning                                 General Comments: Pt slightly confused but oriented to year, month, and location. Pt was unable to name the exact hospital she was in but did know what town.      General Comments      Exercises     Assessment/Plan    PT Assessment Patient needs continued PT services  PT Problem List Decreased strength;Decreased activity tolerance;Decreased balance;Decreased mobility       PT Treatment Interventions DME instruction;Gait training;Stair training;Functional mobility training;Therapeutic activities;Therapeutic exercise;Balance training;Patient/family education;Neuromuscular re-education    PT Goals (Current goals can be found in the Care Plan section)  Acute Rehab PT Goals Patient Stated Goal: return home PT Goal Formulation: With patient Time For Goal Achievement:  11/28/20 Potential to Achieve Goals: Good    Frequency Min 5X/week   Barriers to discharge        Co-evaluation PT/OT/SLP Co-Evaluation/Treatment: Yes Reason for Co-Treatment: To address functional/ADL transfers;Necessary to address cognition/behavior during functional activity PT goals addressed during session: Mobility/safety with mobility;Balance;Proper use of DME         AM-PAC PT "6 Clicks" Mobility  Outcome Measure Help needed turning from your back to your side while in a flat bed without using bedrails?: None Help needed moving from lying on your back to sitting on the side of a flat bed without using bedrails?: None Help needed moving to and from a bed to a chair (including a wheelchair)?: A Little Help needed standing up from a chair using your arms (e.g., wheelchair or bedside chair)?: A Little Help needed to walk in hospital room?: A Little Help needed climbing 3-5 steps with a railing? : A Lot 6 Click Score: 19    End of Session   Activity Tolerance: Patient tolerated treatment well;Patient limited by fatigue Patient left: in chair;with call bell/phone within reach;with chair alarm set Nurse Communication: Mobility status PT Visit Diagnosis: Unsteadiness on feet (R26.81);Other abnormalities of gait and mobility (R26.89);Muscle weakness (generalized) (M62.81)    Time: 5974-1638 PT Time Calculation (min) (ACUTE ONLY): 20 min   Charges:   PT Evaluation $PT Eval Moderate Complexity: 1 Mod PT Treatments $Therapeutic Activity: 8-22 mins        2:00 PM, 11/14/20 Lonell Grandchild,  MPT Physical Therapist with [facility] [phone] office [phone] mobile phone

## 2020-11-14 NOTE — Evaluation (Signed)
Occupational Therapy Evaluation Patient Details Name: GERARDINE PELTZ MRN: [id number] DOB: December 28, 1933 Today's Date: 11/14/2020    History of Present Illness Dasia S Kopplin is a 87 y.o. female with medical history significant for Sjogren's, lupus, hypertension, hyperlipidemia, GERD, recurrent UTI and chronic constipation who presented to the emergency department accompanied by son due to 5-day onset of worsening altered mental status.  Most of the history was obtained from son at bedside, per son, patient lives alone and was capable of IADL, however, starting on Wednesday (7/13), she was noted to start to hallucinate occasionally by seeing things that were not there in the house.  Yesterday, she called son that there were people on her porch and that she was going to call the police, son went to her house and noted that no one was on the porch.  She also commented of a lot of dirt on the floor, however, the floor was not dirty.  Per son, patient was normally alert and oriented x3, though, she started to present with some memory difficulty within the last year to 2 years, but she never had any hallucinations.  Patient lives alone at home, she was usually very active with church activity prior to [place], she still goes to church now, but only Sundays and Thursdays.  She had 2 COVID vaccines and 2 boosters.  She never tested positive for COVID.  She was recently treated for UTI about 3 weeks ago, patient currently denies any irritative bladder symptoms.   Clinical Impression   Pt agreeable to OT/PT co-evaluation. Pt able to complete bed mobility with mod I level of assist and functional transfers and mobility out of bed with Min A and use of cane. Pt unsteady at times during ambulation. Pt presents with B UE general weakness. Pt demonstrates possible visual deficits but difficult to assess due to pt possibly struggling to understand and follow the instructions completely. Pt reports only PRN assist. Pt  requires 24/7 supervision due to unsteady gain and mild confusion. Pt will benefit from continued OT in the hospital and recommended venue below to increase strength, balance, and endurance for safe ADL's.     Follow Up Recommendations  CIR;Supervision/Assistance - 24 hour    Equipment Recommendations  None recommended by OT           Precautions / Restrictions Precautions Precautions: Fall Restrictions Weight Bearing Restrictions: No      Mobility Bed Mobility Overal bed mobility: Modified Independent                  Transfers Overall transfer level: Needs assistance Equipment used: Rolling walker (2 wheeled) Transfers: Sit to/from [facility] Sit to Stand: Min guard;Min assist Stand pivot transfers: Min guard;Min assist       General transfer comment: Pt unsteady during ambulator transfer from EOB to ambulation in hall to sitting in chair. Pt used cane during majority of mobility.    Balance Overall balance assessment: Needs assistance Sitting-balance support: No upper extremity supported;Feet supported Sitting balance-Leahy Scale: Good Sitting balance - Comments: EOB   Standing balance support: Single extremity supported;During functional activity Standing balance-Leahy Scale: Fair Standing balance comment: using cane                           ADL either performed or assessed with clinical judgement   ADL Overall ADL's : Needs assistance/impaired Eating/Feeding: Independent   Grooming: Sitting;Set up   Upper Body Bathing: Set up;Sitting  Lower Body Bathing: Min guard;Minimal assistance;Sit to/from stand   Upper Body Dressing : Set up;Sitting   Lower Body Dressing: Minimal assistance;Min guard;Sit to/from stand   Toilet Transfer: Minimal assistance;RW;Ambulation Armed forces technical officer Details (indicate cue type and reason): simulated via ambulation from EOB to hall to chair. Toileting- Water quality scientist and Hygiene: Min  guard;Sit to/from stand;Sitting/lateral lean;With adaptive equipment   Tub/ Shower Transfer: Minimal assistance;Stand-pivot   Functional mobility during ADLs: Minimal assistance;Cane General ADL Comments: Clinical judgement used to determine all ADL's but toilet transfer which was partially simulate via ambulation from EOB to chair.     Vision Baseline Vision/History: Wears glasses Wears Glasses: At all times Patient Visual Report: No change from baseline Vision Assessment?: Yes Tracking/Visual Pursuits: Other (comment) (Poor tracking throughout with pt demonstrating difficulty maintaining gaze on moving stimulus.) Convergence: Impaired (comment) (Poor convergence. Pt may have had difficulty with understanding instructions.) Visual Fields: No apparent deficits                Pertinent Vitals/Pain Pain Assessment: No/denies pain     Hand Dominance Right   Extremity/Trunk Assessment Upper Extremity Assessment Upper Extremity Assessment: Generalized weakness (4/5 MMT grossly)   Lower Extremity Assessment Lower Extremity Assessment: Defer to PT evaluation   Cervical / Trunk Assessment Cervical / Trunk Assessment: Normal   Communication Communication Communication: HOH   Cognition Arousal/Alertness: Awake/alert Behavior During Therapy: WFL for tasks assessed/performed Overall Cognitive Status: No family/caregiver present to determine baseline cognitive functioning                                 General Comments: Pt slightly confused but oriented to year, month, and location. Pt was unable to name the exact hospital she was in but did know what town.                    Home Living Family/patient expects to be discharged to:: Private residence Living Arrangements: Alone Available Help at Discharge: Family;Friend(s);Available PRN/intermittently Type of Home: House Home Access: Stairs to enter [employer] of Steps: 1 Entrance Stairs-Rails:  None Home Layout: One level     Bathroom Shower/Tub: Occupational psychologist: Handicapped height Bathroom Accessibility: Yes How Accessible: Accessible via walker Home Equipment: [place] - 2 wheels;Bedside commode;Cane - single point   Additional Comments: Pt reported family lives near her.      Prior Functioning/Environment Level of Independence: Independent with assistive device(s)        Comments: Pt reports independence with cane PRN for mobility.        OT Problem List: Decreased strength;Impaired balance (sitting and/or standing);Decreased cognition      OT Treatment/Interventions: Self-care/ADL training;Therapeutic exercise;Therapeutic activities;Patient/family education;Balance training;Cognitive remediation/compensation;Visual/perceptual remediation/compensation;DME and/or AE instruction;Neuromuscular education    OT Goals(Current goals can be found in the care plan section) Acute Rehab OT Goals Patient Stated Goal: return home OT Goal Formulation: With patient Time For Goal Achievement: 11/28/20 Potential to Achieve Goals: Good  OT Frequency: Min 2X/week               Co-evaluation PT/OT/SLP Co-Evaluation/Treatment: Yes Reason for Co-Treatment: To address functional/ADL transfers   OT goals addressed during session: ADL's and self-care;Strengthening/ROM                       End of Session Equipment Utilized During Treatment: Gait belt (cane)  Activity Tolerance: Patient tolerated treatment well Patient left:  in chair;with call bell/phone within reach;with chair alarm set  OT Visit Diagnosis: Unsteadiness on feet (R26.81);Muscle weakness (generalized) (M62.81)                Time: 8921-1941 OT Time Calculation (min): 18 min Charges:  OT General Charges $OT Visit: 1 Visit OT Evaluation $OT Eval Low Complexity: 1 Low  Jeremian Whitby OT, MOT   Larey Seat 11/14/2020, 10:09 AM

## 2020-11-14 NOTE — TOC Progression Note (Signed)
Transition of Care [facility]) - Progression Note    Patient Details  Name: Peggy Ramirez MRN: [id number] Date of Birth: April 16, 1934  [facility] of [facility] CM/SW Contact  Shade Flood, LCSW Phone Number: 11/14/2020, 4:13 PM  Clinical Narrative:     CIR admissions stating pt likely does not need that level of rehab and recommendation is for SNF. Pt has already been referred out. This LCSW started insurance auth in anticipation of dc tomorrow.  Assigned TOC will follow.  Expected Discharge Plan: IP Rehab Facility Barriers to Discharge: Continued Medical Work up, Orthoptist and Services Expected Discharge Plan: [place] In-house Referral: Clinical Social Work   Post Acute Care Choice: IP Rehab Living arrangements for the past 2 months: Single Family Home                                       Social Determinants of Health (SDOH) Interventions    Readmission Risk Interventions No flowsheet data found.

## 2020-11-14 NOTE — Progress Notes (Signed)
Patient sitting up in the bedside chair for her dinner. Took medications with coaching. No issues with her swallowing.

## 2020-11-14 NOTE — Progress Notes (Signed)
SLP Cancellation Note  Patient Details Name: Peggy Ramirez MRN: [id number] DOB: [birth year]   Cancelled treatment:       Reason Eval/Treat Not Completed: Other (comment); Pt passed Yale swallow screen and has been consuming regular textures without incident per RN. SLP will d/c order for BSE. If MD desires SLE (cognitive linguistic evaluation), please order.   Thank you,  Peggy Ramirez, [place]    [place] 11/14/2020, 1:12 PM

## 2020-11-14 NOTE — Progress Notes (Signed)
Inpatient Rehab Admissions Coordinator Note:   Per therapy recommendations, pt was screened for CIR candidacy by Shann Medal, PT, DPT.  At this time note pt mobilizing with min assist or better with [facility], up to 66' with PT.  Per chart review, typically uses RW or SPC at baseline, with progressive memory deficits over the last few years.  Pt would likely have supervision goals for discharge from CIR and is already very close to that level now.  [employer] Medicare takes at least 48 hrs to return a determination, at which time pt will likely not require CIR stay.  Recommend pursue alternate venues for post-acute rehab.  Please contact me with questions.   Shann Medal, PT, DPT [phone] 11/14/20 3:40 PM

## 2020-11-14 NOTE — Progress Notes (Signed)
Patient given a dose of Haldol PRN, patient has calmed down some from earlier. Back into the bed. Nursing is at bedside.

## 2020-11-14 NOTE — NC FL2 (Signed)
[place] MEDICAID FL2 LEVEL OF CARE SCREENING TOOL     IDENTIFICATION  Patient Name: Peggy Ramirez Birthdate: 20-Aug-1933 Sex: female Admission Date (Current Location): 11/12/2020  [facility] and [place] Number:  [employer] and Address:  [place] [address] [place]      Provider Number: [id number]  Attending Physician Name and Address:  Deatra James, MD  Relative Name and Phone Number:       Current Level of Care: Hospital Recommended Level of Care: [place] Prior Approval Number:    Date Approved/Denied:   PASRR Number: [id number]  Discharge Plan: SNF    Current Diagnoses: Patient Active Problem List   Diagnosis Date Noted   Acute metabolic encephalopathy 47/82/9562   Altered mental status 11/12/2020   GERD (gastroesophageal reflux disease) 11/12/2020   Hypokalemia 12/15/2019   Chronic constipation 12/15/2019   Acute urinary retention 12/15/2019   Closed left hip fracture ([place]) 12/14/2019   Essential hypertension 01/20/2018   Acute CVA (cerebrovascular accident) ([place]) 10/16/2015   Left hand weakness 10/15/2015   Ptosis 12/22/2012   Weakness 12/22/2012   Primary hyperparathyroidism ([place]) 08/05/2012   Chest pain 03/18/2010   CONJUNCTIVITIS, ACUTE 04/26/2008   MEMBRANOUS GLOMERULONEPHRITIS 04/26/2008   SJOGREN'S SYNDROME 04/26/2008   ABDOMINAL PAIN, CHRONIC 04/26/2008   NONSPECIFIC ABNORMAL UNSPEC CV FUNCTION STUDY 04/26/2008    Orientation RESPIRATION BLADDER Height & Weight     Self, Place  Normal Continent Weight: 109 lb (49.4 kg) Height:  5' (152.4 cm)  BEHAVIORAL SYMPTOMS/MOOD NEUROLOGICAL BOWEL NUTRITION STATUS      Continent Diet (see dc summary)  AMBULATORY STATUS COMMUNICATION OF NEEDS Skin   Extensive Assist Verbally Normal                       Personal Care Assistance Level of Assistance  Bathing, Feeding, Dressing Bathing Assistance: Limited assistance Feeding  assistance: Independent Dressing Assistance: Limited assistance     Functional Limitations Info  Sight, Hearing, Speech Sight Info: Adequate Hearing Info: Adequate Speech Info: Adequate    SPECIAL CARE FACTORS FREQUENCY  PT (By licensed PT), OT (By licensed OT)     PT Frequency: 5x week OT Frequency: 3x week            Contractures Contractures Info: Not present    Additional Factors Info  Code Status, Allergies Code Status Info: Full Allergies Info: Atorvastatin, Cellcept, Medrol, Mycophenolate Mofetil, Penicillins, Sulfonamide Derivatives, Topiratmate, Verapamil, Levofloxacin           Current Medications (11/14/2020):  This is the current hospital active medication list Current Facility-Administered Medications  Medication Dose Route Frequency Provider Last Rate Last Admin   0.9 %  sodium chloride infusion   Intravenous Continuous Adefeso, Oladapo, DO       acetaminophen (TYLENOL) tablet 650 mg  650 mg Oral Q6H PRN Adefeso, Oladapo, DO   650 mg at 11/12/20 2224   aspirin EC tablet 81 mg  81 mg Oral Q breakfast Emokpae, Courage, MD   81 mg at 11/14/20 1308   clopidogrel (PLAVIX) tablet 75 mg  75 mg Oral Daily Emokpae, Courage, MD   75 mg at 11/14/20 0829   enoxaparin (LOVENOX) injection 40 mg  40 mg Subcutaneous Q24H Adefeso, Oladapo, DO   40 mg at 11/13/20 2152   ezetimibe (ZETIA) tablet 10 mg  10 mg Oral q1800 Emokpae, Courage, MD       pantoprazole (PROTONIX) EC tablet 40 mg  40 mg Oral Daily PRN Adefeso, Oladapo, DO       polyethylene glycol (MIRALAX / GLYCOLAX) packet 17 g  17 g Oral Daily Adefeso, Oladapo, DO   17 g at 11/14/20 7681   simvastatin (ZOCOR) tablet 20 mg  20 mg Oral q1800 Roxan Hockey, MD         Discharge Medications: Please see discharge summary for a list of discharge medications.  Relevant Imaging Results:  Relevant Lab Results:   Additional Information SSN: [place], LCSW

## 2020-11-14 NOTE — Plan of Care (Signed)
  Problem: Acute Rehab PT Goals(only PT should resolve) Goal: Pt Will Go Supine/Side To Sit Outcome: Progressing Flowsheets (Taken 11/14/2020 1401) Pt will go Supine/Side to Sit: Independently Goal: Patient Will Transfer Sit To/From Stand Outcome: Progressing Flowsheets (Taken 11/14/2020 1401) Patient will transfer sit to/from stand:  with supervision  with modified independence Goal: Pt Will Transfer Bed To Chair/Chair To Bed Outcome: Progressing Flowsheets (Taken 11/14/2020 1401) Pt will Transfer Bed to Chair/Chair to Bed:  with modified independence  with supervision Goal: Pt Will Ambulate Outcome: Progressing Flowsheets (Taken 11/14/2020 1401) Pt will Ambulate:  > 125 feet  with supervision  with modified independence  with cane  with least restrictive assistive device   2:02 PM, 11/14/20 Lonell Grandchild, MPT Physical Therapist with [facility] 336 [phone] office [phone] mobile phone

## 2020-11-15 LAB — GLUCOSE, CAPILLARY
Glucose-Capillary: 101 mg/dL — ABNORMAL HIGH (ref 70–99)
Glucose-Capillary: 89 mg/dL (ref 70–99)

## 2020-11-15 LAB — T3, FREE: T3, Free: 3.1 pg/mL (ref 2.0–4.4)

## 2020-11-15 MED ORDER — ALPRAZOLAM 0.5 MG PO TABS
0.5000 mg | ORAL_TABLET | Freq: Three times a day (TID) | ORAL | Status: DC | PRN
  Administered 2020-11-15: 0.5 mg via ORAL
  Filled 2020-11-15: qty 1

## 2020-11-15 NOTE — Care Management Important Message (Signed)
Important Message  Patient Details  Name: Peggy Ramirez MRN: [id number] Date of Birth: 06/21/1933   Medicare Important Message Given:  Yes     Tommy Medal 11/15/2020, 4:25 PM

## 2020-11-15 NOTE — TOC Progression Note (Signed)
Transition of Care [facility]) - Progression Note    Patient Details  Name: ELANORA QUIN MRN: [id number] Date of Birth: 1933/05/13  [facility]) CM/SW Contact  Natasha Bence, LCSW Phone Number: 11/15/2020, 2:16 PM  Clinical Narrative:    CSW received call from [place] with [facility] who reported that they are able to make bed offer, once patient is off Haldol with out agitated behavior. Debbie with [facility] also reported that she is un able to take the patient until patient is 24 hours without Haldol unless patient has an underlying diagnosis for behavior displayed. CSW updated patient's son. Patient's son reported that he preferred [facility]. Auth approved. Facility will need to be added to [place]. TOC to follow.   Expected Discharge Plan: IP Rehab Facility Barriers to Discharge: Continued Medical Work up, Orthoptist and Services Expected Discharge Plan: [place] In-house Referral: Clinical Social Work   Post Acute Care Choice: IP Rehab Living arrangements for the past 2 months: Single Family Home                                       Social Determinants of Health (SDOH) Interventions    Readmission Risk Interventions No flowsheet data found.

## 2020-11-15 NOTE — Progress Notes (Addendum)
Peggy Ramirez, is a 87 y.o. female, DOB - 04-01-1934, TGG:[id number]  Admit date - 11/12/2020   Admitting Physician Courage Denton Brick, MD  Outpatient Primary MD for the patient is Sasser, Silvestre Moment, MD  LOS - 2   Chief Complaint  Patient presents with   Altered Mental Status        Subjective:    Peggy Ramirez was seen and examined this morning, awake alert oriented x3.  Patient had episode of confusion agitation yesterday evening and overnight again.    Assessment  & Plan :    Principal Problem:   Acute CVA (cerebrovascular accident) [facility]) Active Problems:   Essential hypertension   Chronic constipation   Altered mental status   GERD (gastroesophageal reflux disease)   Acute metabolic encephalopathy  Brief Summary:- 87 y.o. female with medical history significant for Sjogren's, lupus, hypertension, hyperlipidemia, GERD, recurrent UTI and chronic constipation admitted on 11/12/2020 with acute strokes resulting in acute metabolic encephalopathy/psychosis  A/p 1)Acute CVA--- MRI brain and MRA Head on 11/13/2020 showed acute infarct in the right frontal white matter.  - Possible additional small acute infarct in the left frontal cortex. No significant edema or mass effect. Given potential involvement of multiple vascular territories, likely embolic etiology Neurologist recommended 30-day event monitor-cardiology was notified to assist  --TTE with preserved EF of 60 to 65%,  Awaiting official consult from Dr. Merlene Laughter the neurologist, recommended no TEE   -PT, OT and speech pathology consult requested -Get CTA neck -pending as patient refused to get the study done yesterday -Continue aspirin, Plavix and statin as ordered -TSH 1.1  -Status post PT/OT evaluation recommending CIR -24-hour supervision for now steady gait and confusion  2)acute metabolic encephalopathy/psychosis--- due to #1 above, manage as above -Serum ammonia WNL,  folate, B12 and TSH WNL -We will consider EEG per neurology recommendation --Confusion agitation at evening, night consistent with possibly sundowning -Neurology stating associated hallucination raises the possibility of Lewy body dementia As needed Xanax was added  3)H/o HTN-- hypotensive episode-- -Her blood pressure has stabilized  -cortisol is not low, maintain adequate hydration -Avoid hypotension, -Allow permissive hypertension  4)Hypokalemia/hypophosphatemia --- replace Kcl, replace phosphorous, magnesium is WNL  5)GERD--continue Protonix 40 mg daily  6) Thyroid mass -Normal TSH, free T4 Thyroid ultrasound: IMPRESSION: 1. Heterogeneous, enlarged and multinodular thyroid gland most consistent with multinodular goiter. 2. A 1.4 cm TI-RADS category 4 nodule in the right mid gland meets criteria for imaging surveillance. Recommend follow-up ultrasound in 1 year. 3. Additional bilateral thyroid nodules also noted incidentally but do not meet criteria to recommend further evaluation.   Disposition/Need for in-Hospital Stay- patient unable to be discharged at this time due to acute stroke and electrolyte abnormalities requiring neuro consult, PT OT speech eval as well as replacement of electrolytes- -- Patient was deemed too functional for CIR, SNF is considering  Status is: Inpatient  Remains inpatient appropriate because: See disposition above  Disposition: The patient is from: Home              Anticipated d/c is to: SNF               Anticipated d/c date is: 2 days              Patient currently is not medically stable to d/c. Barriers: Not Clinically Stable-   Code Status :  -  Code Status: Full Code   Family Communication:    Discussed with son and son [name]  at bedside  Consults  :  Neurology  DVT Prophylaxis  :   - SCDs   enoxaparin (LOVENOX) injection 40 mg Start: 11/12/20 2145 SCDs Start: 11/12/20 2046  Lab Results  Component Value Date   PLT 218 11/13/2020     Inpatient Medications  Scheduled Meds:  aspirin EC  81 mg Oral Q breakfast   clopidogrel  75 mg Oral Daily   enoxaparin (LOVENOX) injection  40 mg Subcutaneous Q24H   ezetimibe  10 mg Oral q1800   polyethylene glycol  17 g Oral Daily   simvastatin  20 mg Oral q1800   Continuous Infusions:   PRN Meds:.acetaminophen, ALPRAZolam, pantoprazole  Anti-infectives (From admission, onward)    None         Objective:   Vitals:   11/13/20 2127 11/14/20 0514 11/14/20 1313 11/14/20 2100  BP: (!) 144/70 (!) 149/65 (!) 151/76 (!) 116/52  Pulse: 72 66 83 72  Resp: 18 18 16 16   Temp: 98.1 F (36.7 C) 98.4 F (36.9 C) 98.2 F (36.8 C) 98.2 F (36.8 C)  TempSrc:  Oral Oral Oral  SpO2: 99% 99% 99% 98%  Weight:      Height:        Wt Readings from Last 3 Encounters:  11/12/20 49.4 kg  11/01/20 49.7 kg  12/17/19 50.5 kg   Intake/Output Summary (Last 24 hours) at 11/15/2020 1218 Last data filed at 11/15/2020 2993 Gross per 24 hour  Intake 1076 ml  Output 300 ml  Net 776 ml       Physical Exam:   General:  Alert, oriented, cooperative, no distress;   HEENT:  Normocephalic, PERRL, otherwise with in Normal limits   Neuro:  CNII-XII intact. , normal motor and sensation, reflexes intact   Lungs:   Clear to auscultation BL, Respirations unlabored, no wheezes / crackles  Cardio:    S1/S2, RRR, No murmure, No Rubs or Gallops   Abdomen:   Soft, non-tender, bowel sounds active all four quadrants,  no guarding or peritoneal signs.  Muscular skeletal:  Limited exam - in bed, able to move all 4 extremities, Normal strength,  2+ pulses,  symmetric, No pitting edema  Skin:  Dry, warm to touch, negative for any Rashes,  Wounds: Please see nursing documentation            Data Review:   Micro Results Recent Results (from the past 240 hour(s))  Resp Panel by RT-PCR (Flu A&B, Covid) Nasopharyngeal Swab     Status: None   Collection Time: 11/12/20  7:27 PM   Specimen:  Nasopharyngeal Swab; Nasopharyngeal(NP) swabs in vial transport medium  Result Value Ref Range Status   SARS Coronavirus 2 by RT PCR NEGATIVE NEGATIVE Final    Comment: (NOTE) SARS-CoV-2 target nucleic acids are NOT DETECTED.  The SARS-CoV-2 RNA is generally detectable in upper respiratory specimens during the acute phase of infection. The lowest concentration of SARS-CoV-2 viral copies this assay can detect is 138 copies/mL. A negative result does not preclude SARS-Cov-2 infection and should not be used as the sole basis for treatment or other patient management decisions. A negative result may occur with  improper specimen collection/handling, submission of specimen other than nasopharyngeal swab, presence of viral mutation(s) within the areas targeted by this assay, and inadequate number of viral copies(<138 copies/mL). A negative result must be combined with clinical observations, patient history, and epidemiological information. The expected result is Negative.  Fact Sheet for Patients:  EntrepreneurPulse.com.au  Fact Sheet  for Healthcare Providers:  IncredibleEmployment.be  This test is no t yet approved or cleared by the [place] and  has been authorized for detection and/or diagnosis of SARS-CoV-2 by FDA under an Emergency Use Authorization (EUA). This EUA will remain  in effect (meaning this test can be used) for the duration of the COVID-19 declaration under Section 564(b)(1) of the Act, 21 U.S.C.section 360bbb-3(b)(1), unless the authorization is terminated  or revoked sooner.       Influenza A by PCR NEGATIVE NEGATIVE Final   Influenza B by PCR NEGATIVE NEGATIVE Final    Comment: (NOTE) The Xpert Xpress SARS-CoV-2/FLU/RSV plus assay is intended as an aid in the diagnosis of influenza from Nasopharyngeal swab specimens and should not be used as a sole basis for treatment. Nasal washings and aspirates are unacceptable for  Xpert Xpress SARS-CoV-2/FLU/RSV testing.  Fact Sheet for Patients: EntrepreneurPulse.com.au  Fact Sheet for Healthcare Providers: IncredibleEmployment.be  This test is not yet approved or cleared by the [place] FDA and has been authorized for detection and/or diagnosis of SARS-CoV-2 by FDA under an Emergency Use Authorization (EUA). This EUA will remain in effect (meaning this test can be used) for the duration of the COVID-19 declaration under Section 564(b)(1) of the Act, 21 U.S.C. section 360bbb-3(b)(1), unless the authorization is terminated or revoked.  Performed at [facility], [address], [place], [place] [address]     Radiology Reports CT ANGIO HEAD W OR WO CONTRAST  Result Date: 11/14/2020 CLINICAL DATA:  Neuro deficit, acute, stroke suspected. EXAM: CT ANGIOGRAPHY HEAD AND NECK TECHNIQUE: Multidetector CT imaging of the head and neck was performed using the standard protocol during bolus administration of intravenous contrast. Multiplanar CT image reconstructions and MIPs were obtained to evaluate the vascular anatomy. Carotid stenosis measurements (when applicable) are obtained utilizing NASCET criteria, using the distal internal carotid diameter as the denominator. CONTRAST:  130mL OMNIPAQUE IOHEXOL 350 MG/ML SOLN COMPARISON:  MRI/MRA head 11/13/2020. Head CT 11/12/2020. FINDINGS: CT HEAD FINDINGS Brain: Mild generalized cerebral atrophy. Known small acute infarcts within the right frontal white matter and left frontal lobe cortex were better appreciated on the brain MRI of 11/13/2020. Background moderate patchy and ill-defined hypoattenuation within the cerebral white matter, nonspecific but compatible chronic small vessel ischemic disease. Redemonstrated small chronic cortical infarct within the anterior left frontal lobe. There is no acute intracranial hemorrhage. No extra-axial fluid collection. No evidence of an intracranial mass.  No midline shift. Vascular: No hyperdense vessel.  Atherosclerotic calcifications. Skull: Normal. Negative for fracture or focal lesion. Sinuses: No significant paranasal sinus disease. Orbits: No mass or acute finding. Other: Small right mastoid effusion. Review of the MIP images confirms the above findings CTA NECK FINDINGS Aortic arch: Standard aortic branching. The innominate artery origin is excluded from the field of view. Atherosclerotic plaque within the visualized aortic arch and proximal major branch vessels of the neck. Soft and calcified plaque within the proximal right subclavian artery results in 65% stenosis. No significant hemodynamically significant stenosis within the visualized innominate artery or proximal left subclavian artery. Right carotid system: CCA and ICA patent within the neck. Soft and calcified plaque within the proximal ICA with resultant 40% stenosis. Left carotid system: CCA and ICA patent within the neck without stenosis. Minimal soft plaque within the CCA. Mild calcified plaque at the carotid bifurcation. Vertebral arteries: Codominant and patent within the neck without stenosis. Skeleton: Cervical spondylosis with suspected degenerative fusion across the C4-C5 and C5-C6 disc spaces and multilevel  facet joint ankylosis. No acute bony abnormality or aggressive osseous lesion. Other neck: No cervical lymphadenopathy. Multiple thyroid nodules, the largest within the right lobe measuring 2.0 cm. Fatty atrophy of the bilateral parotid glands. Upper chest: No consolidation within the imaged lung apices. Review of the MIP images confirms the above findings CTA HEAD FINDINGS Anterior circulation: The intracranial internal carotid arteries are patent. Calcified plaque within both vessels without stenosis. The M1 middle cerebral arteries are patent. Atherosclerotic irregularity of the M2 and more distal middle cerebral arteries bilaterally. No M2 proximal branch occlusion or high-grade  proximal stenosis is identified. The anterior cerebral arteries are patent. 1-2 mm inferiorly projecting vascular protrusion arising from the supraclinoid right ICA, which may reflect an aneurysm or infundibulum (series 12, image 95). Posterior circulation: The intracranial vertebral arteries are patent. The basilar artery is patent. Mild atherosclerotic irregularity of these vessels without stenosis. The posterior cerebral arteries are patent. Mild atherosclerotic irregularity of both vessels without significant proximal stenosis. Posterior communicating arteries are hypoplastic or absent bilaterally. Venous sinuses: Within the limitations of contrast timing, no convincing thrombus. Anatomic variants: As described Review of the MIP images confirms the above findings IMPRESSION: CT head: 1. Known small acute infarcts within the right frontal lobe white matter and left frontal lobe cortex were better appreciated on the brain MRI of 11/13/2020. 2. Redemonstrated small chronic cortical infarct within the anterior left frontal lobe. 3. Background moderate cerebral white matter chronic small vessel ischemic disease. 4. Mild generalized cerebral atrophy. 5. Small right mastoid effusion. CTA neck: 1. Atherosclerotic plaque within the proximal right subclavian artery results in 65% stenosis. 2. The bilateral common carotid and internal carotid arteries are patent within the neck. Atherosclerotic plaque within the proximal right ICA results in 40% stenosis. Minimal atherosclerotic plaque within the left carotid system within the neck. 3. Vertebral arteries codominant and patent within the neck without stenosis. 4. Multiple thyroid nodules, the largest within the right lobe measuring 2 cm. A dedicated thyroid ultrasound may be obtained for further evaluation as clinically warranted (given the patient's advanced age). CTA head: 1. Atherosclerotic disease. No intracranial large vessel occlusion or proximal high-grade stenosis.  2. 1-2 mm inferiorly projecting vascular protrusion arising from the supraclinoid right ICA, which may reflect an aneurysm or infundibulum. Electronically Signed   By: Kellie Simmering DO   On: 11/14/2020 13:43   CT Head Wo Contrast  Result Date: 11/12/2020 CLINICAL DATA:  Delirium.  Confusion. EXAM: CT HEAD WITHOUT CONTRAST TECHNIQUE: Contiguous axial images were obtained from the base of the skull through the vertex without intravenous contrast. COMPARISON:  Head CT 10/15/2015 FINDINGS: Brain: Stable degree of atrophy there is moderate chronic small vessel ischemia with progression from 2017. no intracranial hemorrhage, mass effect, or midline shift. No hydrocephalus. The basilar cisterns are patent. No evidence of territorial infarct or acute ischemia. No extra-axial or intracranial fluid collection. Vascular: Atherosclerosis of skullbase vasculature without hyperdense vessel or abnormal calcification. Skull: No fracture or focal lesion. Sinuses/Orbits: Minor mucosal thickening of ethmoid air cells. No sinus fluid levels. Hypoplastic right mastoid air cells. No significant mastoid effusion. Unremarkable orbits. Other: None. IMPRESSION: 1. No acute intracranial abnormality. 2. Generalized atrophy and chronic small vessel ischemia. Electronically Signed   By: Keith Rake M.D.   On: 11/12/2020 18:06   CT ANGIO NECK W OR WO CONTRAST  Result Date: 11/14/2020 CLINICAL DATA:  Neuro deficit, acute, stroke suspected. EXAM: CT ANGIOGRAPHY HEAD AND NECK TECHNIQUE: Multidetector CT imaging of the head and  neck was performed using the standard protocol during bolus administration of intravenous contrast. Multiplanar CT image reconstructions and MIPs were obtained to evaluate the vascular anatomy. Carotid stenosis measurements (when applicable) are obtained utilizing NASCET criteria, using the distal internal carotid diameter as the denominator. CONTRAST:  136mL OMNIPAQUE IOHEXOL 350 MG/ML SOLN COMPARISON:  MRI/MRA  head 11/13/2020. Head CT 11/12/2020. FINDINGS: CT HEAD FINDINGS Brain: Mild generalized cerebral atrophy. Known small acute infarcts within the right frontal white matter and left frontal lobe cortex were better appreciated on the brain MRI of 11/13/2020. Background moderate patchy and ill-defined hypoattenuation within the cerebral white matter, nonspecific but compatible chronic small vessel ischemic disease. Redemonstrated small chronic cortical infarct within the anterior left frontal lobe. There is no acute intracranial hemorrhage. No extra-axial fluid collection. No evidence of an intracranial mass. No midline shift. Vascular: No hyperdense vessel.  Atherosclerotic calcifications. Skull: Normal. Negative for fracture or focal lesion. Sinuses: No significant paranasal sinus disease. Orbits: No mass or acute finding. Other: Small right mastoid effusion. Review of the MIP images confirms the above findings CTA NECK FINDINGS Aortic arch: Standard aortic branching. The innominate artery origin is excluded from the field of view. Atherosclerotic plaque within the visualized aortic arch and proximal major branch vessels of the neck. Soft and calcified plaque within the proximal right subclavian artery results in 65% stenosis. No significant hemodynamically significant stenosis within the visualized innominate artery or proximal left subclavian artery. Right carotid system: CCA and ICA patent within the neck. Soft and calcified plaque within the proximal ICA with resultant 40% stenosis. Left carotid system: CCA and ICA patent within the neck without stenosis. Minimal soft plaque within the CCA. Mild calcified plaque at the carotid bifurcation. Vertebral arteries: Codominant and patent within the neck without stenosis. Skeleton: Cervical spondylosis with suspected degenerative fusion across the C4-C5 and C5-C6 disc spaces and multilevel facet joint ankylosis. No acute bony abnormality or aggressive osseous lesion.  Other neck: No cervical lymphadenopathy. Multiple thyroid nodules, the largest within the right lobe measuring 2.0 cm. Fatty atrophy of the bilateral parotid glands. Upper chest: No consolidation within the imaged lung apices. Review of the MIP images confirms the above findings CTA HEAD FINDINGS Anterior circulation: The intracranial internal carotid arteries are patent. Calcified plaque within both vessels without stenosis. The M1 middle cerebral arteries are patent. Atherosclerotic irregularity of the M2 and more distal middle cerebral arteries bilaterally. No M2 proximal branch occlusion or high-grade proximal stenosis is identified. The anterior cerebral arteries are patent. 1-2 mm inferiorly projecting vascular protrusion arising from the supraclinoid right ICA, which may reflect an aneurysm or infundibulum (series 12, image 95). Posterior circulation: The intracranial vertebral arteries are patent. The basilar artery is patent. Mild atherosclerotic irregularity of these vessels without stenosis. The posterior cerebral arteries are patent. Mild atherosclerotic irregularity of both vessels without significant proximal stenosis. Posterior communicating arteries are hypoplastic or absent bilaterally. Venous sinuses: Within the limitations of contrast timing, no convincing thrombus. Anatomic variants: As described Review of the MIP images confirms the above findings IMPRESSION: CT head: 1. Known small acute infarcts within the right frontal lobe white matter and left frontal lobe cortex were better appreciated on the brain MRI of 11/13/2020. 2. Redemonstrated small chronic cortical infarct within the anterior left frontal lobe. 3. Background moderate cerebral white matter chronic small vessel ischemic disease. 4. Mild generalized cerebral atrophy. 5. Small right mastoid effusion. CTA neck: 1. Atherosclerotic plaque within the proximal right subclavian artery results in 65% stenosis. 2.  The bilateral common carotid  and internal carotid arteries are patent within the neck. Atherosclerotic plaque within the proximal right ICA results in 40% stenosis. Minimal atherosclerotic plaque within the left carotid system within the neck. 3. Vertebral arteries codominant and patent within the neck without stenosis. 4. Multiple thyroid nodules, the largest within the right lobe measuring 2 cm. A dedicated thyroid ultrasound may be obtained for further evaluation as clinically warranted (given the patient's advanced age). CTA head: 1. Atherosclerotic disease. No intracranial large vessel occlusion or proximal high-grade stenosis. 2. 1-2 mm inferiorly projecting vascular protrusion arising from the supraclinoid right ICA, which may reflect an aneurysm or infundibulum. Electronically Signed   By: Kellie Simmering DO   On: 11/14/2020 13:43   MR ANGIO HEAD WO CONTRAST  Result Date: 11/13/2020 CLINICAL DATA:  Mental status change.  Unknown cause. EXAM: MRI HEAD WITHOUT CONTRAST MRA HEAD WITHOUT CONTRAST TECHNIQUE: Multiplanar, multi-echo pulse sequences of the brain and surrounding structures were acquired without intravenous contrast. Angiographic images of the Circle of Willis were acquired using MRA technique without intravenous contrast. COMPARISON:  MRI/MRI October 24, 2015. CT head from yesterday. FINDINGS: MRI HEAD FINDINGS Brain: Acute infarct in the right frontal white matter (series 5, image 23). Additional small mild area of restricted diffusion in the left frontal cortex (series 5, image 21). No significant edema or mass effect. No acute hemorrhage. Additional faint focus of DWI hyperintensity in the more anterior left frontal cortex (series 5, image 24) is associated with an area of susceptibility artifact and favored to represent artifact from mineralization when correlating with prior CT head. No evidence of acute hemorrhage, hydrocephalus, mass lesion, midline shift, extra-axial fluid collection. On susceptibility weighted imaging  there is subtle sulcal susceptibility artifact along the bifrontal convexities, suggestive of superficial siderosis. Moderate scattered T2/FLAIR hyperintensities within the white matter, nonspecific but most likely related to chronic microvascular ischemic disease. Mild for age atrophy. Vascular: See below. Skull and upper cervical spine: Normal marrow signal. Sinuses/Orbits: Mild ethmoid air cell mucosal thickening. Unremarkable orbits. Other: Small bilateral mastoid effusions. MRA HEAD FINDINGS Anterior circulation: Bilateral ICAs, MCAs, in the ACAs are patent. Similar mild moderate stenosis of the left MCA origin. Suspected at least moderate stenosis of the right M1 MCA, which may be accentuated by motion and downward flow on this noncontrast MRA. Posterior circulation: Bilateral vertebral arteries, basilar artery, and posterior cerebral arteries are patent. Suspected at least moderate right vertebral artery stenosis. Bilateral PCAs are patent with likely mild right PCA stenosis. IMPRESSION: MRI: 1. 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 Possible additional small acute infarct in the left frontal cortex. No significant edema or mass effect. Given potential involvement of multiple vascular territories, consider a central embolic etiology. 2. 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 3. Moderate chronic microvascular disease and mild atrophy. MRA: 1. Motion limited evaluation with suspected at least moderate stenosis of the right M1 MCA and right intradural vertebral artery. 2. Mild-to-moderate left M1 MCA stenosis. Electronically Signed   By: Margaretha Sheffield MD   On: 11/13/2020 12:09   MR BRAIN WO CONTRAST  Result Date: 11/13/2020 CLINICAL DATA:  Mental status change.  Unknown cause. EXAM: MRI HEAD WITHOUT CONTRAST MRA HEAD WITHOUT CONTRAST TECHNIQUE:  Multiplanar, multi-echo pulse sequences of the brain and surrounding structures were acquired without intravenous contrast. Angiographic images of the Circle of Willis were acquired using MRA  technique without intravenous contrast. COMPARISON:  MRI/MRI October 24, 2015. CT head from yesterday. FINDINGS: MRI HEAD FINDINGS Brain: Acute infarct in the right frontal white matter (series 5, image 23). Additional small mild area of restricted diffusion in the left frontal cortex (series 5, image 21). No significant edema or mass effect. No acute hemorrhage. Additional faint focus of DWI hyperintensity in the more anterior left frontal cortex (series 5, image 24) is associated with an area of susceptibility artifact and favored to represent artifact from mineralization when correlating with prior CT head. No evidence of acute hemorrhage, hydrocephalus, mass lesion, midline shift, extra-axial fluid collection. On susceptibility weighted imaging there is subtle sulcal susceptibility artifact along the bifrontal convexities, suggestive of superficial siderosis. Moderate scattered T2/FLAIR hyperintensities within the white matter, nonspecific but most likely related to chronic microvascular ischemic disease. Mild for age atrophy. Vascular: See below. Skull and upper cervical spine: Normal marrow signal. Sinuses/Orbits: Mild ethmoid air cell mucosal thickening. Unremarkable orbits. Other: Small bilateral mastoid effusions. MRA HEAD FINDINGS Anterior circulation: Bilateral ICAs, MCAs, in the ACAs are patent. Similar mild moderate stenosis of the left MCA origin. Suspected at least moderate stenosis of the right M1 MCA, which may be accentuated by motion and downward flow on this noncontrast MRA. Posterior circulation: Bilateral vertebral arteries, basilar artery, and posterior cerebral arteries are patent. Suspected at least moderate right vertebral artery stenosis. Bilateral PCAs are patent with likely mild right PCA stenosis.  IMPRESSION: MRI: 1. 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 Possible additional small acute infarct in the left frontal cortex. No significant edema or mass effect. Given potential involvement of multiple vascular territories, consider a central embolic etiology. 2. 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 3. Moderate chronic microvascular disease and mild atrophy. MRA: 1. Motion limited evaluation with suspected at least moderate stenosis of the right M1 MCA and right intradural vertebral artery. 2. Mild-to-moderate left M1 MCA stenosis. Electronically Signed   By: Margaretha Sheffield MD   On: 11/13/2020 12:09   DG Chest Portable 1 View  Result Date: 11/12/2020 CLINICAL DATA:  Altered mental status EXAM: PORTABLE CHEST 1 VIEW COMPARISON:  12/14/2019 FINDINGS: The heart size and mediastinal contours are within normal limits. Both lungs are clear. Metallic device overlies the left perihilar region, likely external to the patient. The visualized skeletal structures are unremarkable. IMPRESSION: No active disease. Electronically Signed   By: Fidela Salisbury MD   On: 11/12/2020 18:28   ECHOCARDIOGRAM COMPLETE  Result Date: 11/13/2020    ECHOCARDIOGRAM REPORT   Patient Name:   SHYKERIA SAKAMOTO Date of Exam: 11/13/2020 Medical Rec #:  [id number]        Height:       60.0 in Accession #:    [id number]       Weight:       109.0 lb Date of Birth:  [birth year]        BSA:          1.442 m Patient Age:    63 years         BP:           84/42 mmHg Patient Gender: F                HR:           67 bpm. Exam Location:  [facility] Procedure: 2D Echo, Cardiac Doppler and  Color Doppler Indications:    Abnormal EKG  History:        Patient has prior history of Echocardiogram examinations, most                 recent 10/16/2015. Arrythmias:Abnormal EKG, Signs/Symptoms:Very                  altered mental status; Risk Factors:Hypertension and                 Dyslipidemia.  Sonographer:    Dustin Flock RDCS Referring Phys: [place]  1. Left ventricular ejection fraction, by estimation, is 60 to 65%. The left ventricle has normal function. The left ventricle has no regional wall motion abnormalities. There is mild left ventricular hypertrophy. Left ventricular diastolic parameters are indeterminate.  2. Right ventricular systolic function is normal. The right ventricular size is normal. There is normal pulmonary artery systolic pressure. The estimated right ventricular systolic pressure is 08.6 mmHg.  3. There is a trivial pericardial effusion that is circumferential.  4. The mitral valve is grossly normal. No evidence of mitral valve regurgitation.  5. The aortic valve is tricuspid. There is mild calcification of the aortic valve. Aortic valve regurgitation is trivial.  6. The inferior vena cava is normal in size with greater than 50% respiratory variability, suggesting right atrial pressure of 3 mmHg. FINDINGS  Left Ventricle: Left ventricular ejection fraction, by estimation, is 60 to 65%. The left ventricle has normal function. The left ventricle has no regional wall motion abnormalities. The left ventricular internal cavity size was normal in size. There is  mild left ventricular hypertrophy. Abnormal (paradoxical) septal motion, consistent with left bundle branch block. Left ventricular diastolic parameters are indeterminate. Right Ventricle: The right ventricular size is normal. No increase in right ventricular wall thickness. Right ventricular systolic function is normal. There is normal pulmonary artery systolic pressure. The tricuspid regurgitant velocity is 2.13 m/s, and  with an assumed right atrial pressure of 3 mmHg, the estimated right ventricular systolic pressure is 76.1 mmHg. Left Atrium: Left atrial size was normal in size. Right Atrium: Right atrial  size was normal in size. Pericardium: Trivial pericardial effusion is present. The pericardial effusion is circumferential. Mitral Valve: The mitral valve is grossly normal. No evidence of mitral valve regurgitation. Tricuspid Valve: The tricuspid valve is grossly normal. Tricuspid valve regurgitation is trivial. Aortic Valve: The aortic valve is tricuspid. There is mild calcification of the aortic valve. Aortic valve regurgitation is trivial. Aortic regurgitation PHT measures 359 msec. Pulmonic Valve: The pulmonic valve was grossly normal. Pulmonic valve regurgitation is trivial. Aorta: The aortic root is normal in size and structure. Venous: The inferior vena cava is normal in size with greater than 50% respiratory variability, suggesting right atrial pressure of 3 mmHg. IAS/Shunts: No atrial level shunt detected by color flow Doppler.  LEFT VENTRICLE PLAX 2D LVIDd:         3.43 cm  Diastology LVIDs:         2.29 cm  LV e' medial:    6.83 cm/s LV PW:         1.25 cm  LV E/e' medial:  12.1 LV IVS:        1.25 cm  LV e' lateral:   8.27 cm/s LVOT diam:     1.90 cm  LV E/e' lateral: 10.0 LV SV:         54 LV SV Index:   37 LVOT Area:  2.84 cm  RIGHT VENTRICLE RV S prime:     14.90 cm/s RVOT diam:      2.60 cm TAPSE (M-mode): 1.5 cm LEFT ATRIUM             Index       RIGHT ATRIUM           Index LA diam:        2.70 cm 1.87 cm/m  RA Area:     10.70 cm LA Vol (A2C):   27.1 ml 18.79 ml/m RA Volume:   22.50 ml  15.60 ml/m LA Vol (A4C):   31.0 ml 21.50 ml/m LA Biplane Vol: 31.4 ml 21.77 ml/m  AORTIC VALVE LVOT Vmax:   105.00 cm/s LVOT Vmean:  69.100 cm/s LVOT VTI:    0.189 m AI PHT:      359 msec  AORTA Ao Root diam: 2.70 cm MITRAL VALVE                TRICUSPID VALVE MV Area (PHT): 5.88 cm     TR Peak grad:   18.1 mmHg MV Decel Time: 129 msec     TR Vmax:        213.00 cm/s MV E velocity: 82.40 cm/s MV A velocity: 120.00 cm/s  SHUNTS MV E/A ratio:  0.69         Systemic VTI:  0.19 m                              Systemic Diam: 1.90 cm                             Pulmonic Diam: 2.60 cm Rozann Lesches MD Electronically signed by Rozann Lesches MD Signature Date/Time: 11/13/2020/12:21:12 PM    Final    US THYROID  Result Date: 11/14/2020 CLINICAL DATA:  Incidental on CT. EXAM: THYROID ULTRASOUND TECHNIQUE: Ultrasound examination of the thyroid gland and adjacent soft tissues was performed. COMPARISON:  CT scan performed earlier today FINDINGS: Parenchymal Echotexture: Mildly heterogenous Isthmus: 0.2 cm Right lobe: 5.4 x 2.3 x 1.3 cm Left lobe: 4.8 x 1.8 x 1.2 cm _________________________________________________________ Estimated total number of nodules >/= 1 cm: 3 Number of spongiform nodules >/=  2 cm not described below (TR1): 0 Number of mixed cystic and solid nodules >/= 1.5 cm not described below (TR2): 0 _________________________________________________________ Nodule # 1: 1.5 x 1.0 x 1.1 cm spongiform nodule in the right upper gland. This is a benign morphology and no further follow-up is required. _________________________________________________________ Nodule # 2: Location: Right; Mid Maximum size: 1.4 cm; Other 2 dimensions: 0.8 x 1.4 cm Composition: solid/almost completely solid (2) Echogenicity: hypoechoic (2) Shape: not taller-than-wide (0) Margins: smooth (0) Echogenic foci: none (0) ACR TI-RADS total points: 4. ACR TI-RADS risk category: TR4 (4-6 points). ACR TI-RADS recommendations: *Given size (>/= 1 - 1.4 cm) and appearance, a follow-up ultrasound in 1 year should be considered based on TI-RADS criteria. _________________________________________________________ Nodule # 4: Small isoechoic predominantly solid nodule in the left superior gland measures up to 1.3 cm. This nodule does not meet criteria for biopsy or dedicated imaging surveillance. _________________________________________________________ Multiple additional small subcentimeter cysts and nodules present scattered throughout the gland. None  meet criteria to warrant further evaluation. IMPRESSION: 1. Heterogeneous, enlarged and multinodular thyroid gland most consistent with multinodular goiter. 2. A 1.4 cm TI-RADS category 4 nodule in the right mid  gland meets criteria for imaging surveillance. Recommend follow-up ultrasound in 1 year. 3. Additional bilateral thyroid nodules also noted incidentally but do not meet criteria to recommend further evaluation. The above is in keeping with the ACR TI-RADS recommendations - J Am Coll Radiol 2017;14:587-595. Electronically Signed   By: Jacqulynn Cadet M.D.   On: 11/14/2020 15:46     CBC Recent Labs  Lab 11/12/20 1615 11/13/20 0411  WBC 4.0 6.8  HGB 14.0 13.0  HCT 41.8 38.5  PLT 229 218  MCV 97.7 97.0  MCH 32.7 32.7  MCHC 33.5 33.8  RDW 12.3 12.2  LYMPHSABS 0.8  --   MONOABS 0.5  --   EOSABS 0.1  --   BASOSABS 0.0  --     Chemistries  Recent Labs  Lab 11/12/20 1615 11/13/20 0411 11/14/20 0520  NA 137 137 138  K 3.8 3.3* 5.0  CL 102 102 104  CO2 27 27 27   GLUCOSE 92 119* 93  BUN 13 15 13   CREATININE 0.69 0.79 0.75  CALCIUM 11.3* 10.6* 10.5*  MG  --  2.2  --   AST 26 23  --   ALT 21 17  --   ALKPHOS 66 54  --   BILITOT 0.7 0.9  --    ------------------------------------------------------------------------------------------------------------------ No results for input(s): CHOL, HDL, LDLCALC, TRIG, CHOLHDL, LDLDIRECT in the last 72 hours.  Lab Results  Component Value Date   HGBA1C 5.2 10/15/2015   ------------------------------------------------------------------------------------------------------------------ Recent Labs    11/13/20 0411  TSH 1.103   ------------------------------------------------------------------------------------------------------------------ Recent Labs    11/13/20 0411  VITAMINB12 655  FOLATE 36.5    Coagulation profile Recent Labs  Lab 11/13/20 0411  INR 1.0    No results for input(s): DDIMER in the last 72  hours.  Cardiac Enzymes No results for input(s): CKMB, TROPONINI, MYOGLOBIN in the last 168 hours.  Invalid input(s): CK ------------------------------------------------------------------------------------------------------------------ No results found for: BNP   Deatra James M.D on 11/15/2020 at 12:18 PM  Go to [email or web address] - for contact info  [facility] - Office  [phone]

## 2020-11-16 LAB — GLUCOSE, CAPILLARY
Glucose-Capillary: 91 mg/dL (ref 70–99)
Glucose-Capillary: 83 mg/dL (ref 70–99)

## 2020-11-16 NOTE — Progress Notes (Signed)
Patient has been very calm and pleasant today. When I first came on shift, assisted her to the chair and she sat up and ate breakfast. No aggression, no jumping out of bed. Family is at bedside now, continuing to monitor this patient.

## 2020-11-16 NOTE — TOC Progression Note (Addendum)
Transition of Care [facility]) - Progression Note    Patient Details  Name: Peggy Ramirez MRN: [id number] Date of Birth: [birth year]  [facility]) CM/SW Contact  Natasha Bence, LCSW Phone Number: 11/16/2020, 3:27 PM  Clinical Narrative:    Ebony Hail with [facility] reported that patient would not be able to admit until Monday due to bed availability. Debbie with [facility] reported that the earliest patient could admit would be Saturday due to bed availability. TOC to follow.   Expected Discharge Plan: IP Rehab Facility Barriers to Discharge: Continued Medical Work up, Orthoptist and Services Expected Discharge Plan: [place] In-house Referral: Clinical Social Work   Post Acute Care Choice: IP Rehab Living arrangements for the past 2 months: Single Family Home                                       Social Determinants of Health (SDOH) Interventions    Readmission Risk Interventions No flowsheet data found.

## 2020-11-16 NOTE — Progress Notes (Signed)
Physical Therapy Treatment Patient Details Name: Peggy Ramirez MRN: [id number] DOB: [birth year] Today's Date: 11/16/2020    History of Present Illness Peggy Ramirez is a 87 y.o. female with medical history significant for Sjogren's, lupus, hypertension, hyperlipidemia, GERD, recurrent UTI and chronic constipation who presented to the emergency department accompanied by son due to 5-day onset of worsening altered mental status.  Most of the history was obtained from son at bedside, per son, patient lives alone and was capable of IADL, however, starting on Wednesday (7/13), she was noted to start to hallucinate occasionally by seeing things that were not there in the house.  Yesterday, she called son that there were people on her porch and that she was going to call the police, son went to her house and noted that no one was on the porch.  She also commented of a lot of dirt on the floor, however, the floor was not dirty.  Per son, patient was normally alert and oriented x3, though, she started to present with some memory difficulty within the last year to 2 years, but she never had any hallucinations.  Patient lives alone at home, she was usually very active with church activity prior to [place], she still goes to church now, but only Sundays and Thursdays.  She had 2 COVID vaccines and 2 boosters.  She never tested positive for COVID.  She was recently treated for UTI about 3 weeks ago, patient currently denies any irritative bladder symptoms.    PT Comments    Patient agreeable to participate and transitions to seated EOB without assist. Patient with good sitting balance and sitting tolerance EOB. She completes seated exercises following initial demonstration with good mechanics. Patient transfers to standing and ambulates with RW with minimal unsteadiness. Patient showing improving activity tolerance today and ambulates increased distance. Patient will benefit from continued physical therapy in  hospital and recommended venue below to increase strength, balance, endurance for safe ADLs and gait.   Follow Up Recommendations  CIR;Supervision for mobility/OOB;Supervision - Intermittent     Equipment Recommendations  None recommended by PT    Recommendations for Other Services       Precautions / Restrictions Precautions Precautions: Fall Restrictions Weight Bearing Restrictions: No    Mobility  Bed Mobility Overal bed mobility: Modified Independent                  Transfers Overall transfer level: Needs assistance Equipment used: Rolling walker (2 wheeled) Transfers: Sit to/from [facility] Sit to Stand: Min guard;Min assist Stand pivot transfers: Min guard;Min assist       General transfer comment: unsteady labored movement with RW  Ambulation/Gait Ambulation/Gait assistance: Min guard;Min assist Gait Distance (Feet): 100 Feet Assistive device: Rolling walker (2 wheeled) Gait Pattern/deviations: Decreased step length - right;Decreased step length - left;Decreased stride length;Drifts right/left Gait velocity: decreased   General Gait Details: minimally unsteady cadence with RW   Stairs             Wheelchair Mobility    Modified Rankin (Stroke Patients Only)       Balance Overall balance assessment: Needs assistance Sitting-balance support: Feet supported;No upper extremity supported Sitting balance-Leahy Scale: Good Sitting balance - Comments: EOB   Standing balance support: During functional activity;No upper extremity supported Standing balance-Leahy Scale: Fair Standing balance comment: fair/good with RW  Cognition Arousal/Alertness: Awake/alert Behavior During Therapy: WFL for tasks assessed/performed Overall Cognitive Status: Within Functional Limits for tasks assessed                                        Exercises General Exercises - Lower  Extremity Long Arc Quad: AROM;Both;10 reps;Seated Hip Flexion/Marching: AROM;Both;10 reps;Seated Toe Raises: AROM;Both;10 reps;Seated Heel Raises: AROM;Both;10 reps;Seated    General Comments        Pertinent Vitals/Pain Pain Assessment: No/denies pain    Home Living                      Prior Function            PT Goals (current goals can now be found in the care plan section) Acute Rehab PT Goals Patient Stated Goal: return home PT Goal Formulation: With patient Time For Goal Achievement: 11/28/20 Potential to Achieve Goals: Good Progress towards PT goals: Progressing toward goals    Frequency    Min 5X/week      PT Plan Current plan remains appropriate    Co-evaluation              AM-PAC PT "6 Clicks" Mobility   Outcome Measure  Help needed turning from your back to your side while in a flat bed without using bedrails?: None Help needed moving from lying on your back to sitting on the side of a flat bed without using bedrails?: None Help needed moving to and from a bed to a chair (including a wheelchair)?: A Little Help needed standing up from a chair using your arms (e.g., wheelchair or bedside chair)?: A Little Help needed to walk in hospital room?: A Little Help needed climbing 3-5 steps with a railing? : A Lot 6 Click Score: 19    End of Session Equipment Utilized During Treatment: Gait belt Activity Tolerance: Patient tolerated treatment well;Patient limited by fatigue Patient left: in chair;with call bell/phone within reach;with family/visitor present;with nursing/sitter in room Nurse Communication: Mobility status PT Visit Diagnosis: Unsteadiness on feet (R26.81);Other abnormalities of gait and mobility (R26.89);Muscle weakness (generalized) (M62.81)     Time: [id number] PT Time Calculation (min) (ACUTE ONLY): 12 min  Charges:  $Therapeutic Activity: 8-22 mins                     1:52 PM, 11/16/20 Mearl Latin PT,  DPT Physical Therapist at [facility]

## 2020-11-16 NOTE — Progress Notes (Signed)
Physician Discharge Summary [facility]    Patient: Peggy Ramirez                   Admit date: 11/12/2020   DOB: Sep 29, 1933             Discharge date:11/16/2020/12:21 PM FY:[id number]                          PCP: Manon Hilding, MD  Disposition: SNF  Recommendations for Outpatient Follow-up:   Follow up: With PCP in 2-3 weeks Follow with neurologist 1-2 weeks Continue currently recommended medication dual antiplatelet treatment, high-dose statins  Discharge Condition: Stable   Code Status:   Code Status: Full Code  Diet recommendation: Cardiac diet   Discharge Diagnoses:    Principal Problem:   Acute CVA (cerebrovascular accident) ([place]) Active Problems:   Essential hypertension   Chronic constipation   Altered mental status   GERD (gastroesophageal reflux disease)   Acute metabolic encephalopathy   History of Present Illness/ Hospital Course Peggy Ramirez Summary:   Brief Summary:- 87 y.o. female with medical history significant for Sjogren's, lupus, hypertension, hyperlipidemia, GERD, recurrent UTI and chronic constipation admitted on 11/12/2020 with acute strokes resulting in acute metabolic encephalopathy/psychosis  A/p 1)Acute CVA--- MRI brain and MRA Head on 11/13/2020 showed acute infarct in the right frontal white matter.  - - Possible additional small acute infarct in the left frontal cortex. No significant edema or mass effect. Given potential involvement of multiple vascular territories, likely embolic etiology Neurologist recommended 30-day event monitor-cardiology was notified to assist  --TTE with preserved EF of 60 to 65%,  Awaiting official consult from Dr. Merlene Laughter the neurologist, recommended no TEE   -PT, OT and speech pathology consult requested -Get CTA neck -pending as patient refused to get the study done yesterday -Continue Aspirin, Plavix  - for 1 month then monotherapy recommended -New high-dose statin  -TSH 1.1  -Status post  PT/OT evaluation recommending CIR versus SNF.... Pending SNF approval  2)acute metabolic encephalopathy/psychosis- -Much improved  due to #1 above, manage as above -Serum ammonia WNL, folate, B12 and TSH WNL -We will consider EEG per neurology recommendation --Confusion agitation at evening, night consistent with possibly sundowning -Neurology stating associated hallucination raises the possibility of Lewy body dementia As needed Xanax was added  3)H/o HTN-- hypotensive episode-- -BP stabilized -cortisol is not low, maintain adequate hydration   4)Hypokalemia/hypophosphatemia --- replace Kcl, replace phosphorous, magnesium is WNL  5)GERD--continue Protonix 40 mg daily  6) Thyroid mass -Normal TSH, free T4 Thyroid ultrasound: IMPRESSION: 1. Heterogeneous, enlarged and multinodular thyroid gland most consistent with multinodular goiter. 2. A 1.4 cm TI-RADS category 4 nodule in the right mid gland meets criteria for imaging surveillance. Recommend follow-up ultrasound in 1 year. 3. Additional bilateral thyroid nodules also noted incidentally but do not meet criteria to recommend further evaluation.   [employer] approval for SNF -- Patient was deemed too functional for CIR, SNF is considering  Status is: Inpatient  Remains inpatient appropriate because:See disposition above  Disposition: The patient is from: Home              Anticipated d/c is to: SNF               Anticipated d/c date is: 1-2 days              Patient currently is not medically stable to d/c. Barriers: Clinically Stable-   Code  Status :  -  Code Status: Full Code   Family Communication:    Discussed with son and son [name] at bedside  Consults  :  Neurology  DVT Prophylaxis  :   - SCDs   enoxaparin (LOVENOX) injection 40 mg Start: 11/12/20 2145 SCDs Start: 11/12/20 2046     Discharge Instructions:     Allergies  Allergen Reactions   Atorvastatin Other (See Comments)     Muscle aches   Cellcept [Mycophenolate Mofetil] Other (See Comments)    unknown   Medrol [Methylprednisolone] Other (See Comments)    unknown   Mycophenolate Mofetil Other (See Comments)    unknown   Penicillins Itching   Sulfonamide Derivatives Nausea Only   Topiramate Other (See Comments)    unknown   Verapamil Other (See Comments)    Felt shakey   Levofloxacin Rash     Procedures /Studies:   CT ANGIO HEAD W OR WO CONTRAST  Result Date: 11/14/2020 CLINICAL DATA:  Neuro deficit, acute, stroke suspected. EXAM: CT ANGIOGRAPHY HEAD AND NECK TECHNIQUE: Multidetector CT imaging of the head and neck was performed using the standard protocol during bolus administration of intravenous contrast. Multiplanar CT image reconstructions and MIPs were obtained to evaluate the vascular anatomy. Carotid stenosis measurements (when applicable) are obtained utilizing NASCET criteria, using the distal internal carotid diameter as the denominator. CONTRAST:  111m OMNIPAQUE IOHEXOL 350 MG/ML SOLN COMPARISON:  MRI/MRA head 11/13/2020. Head CT 11/12/2020. FINDINGS: CT HEAD FINDINGS Brain: Mild generalized cerebral atrophy. Known small acute infarcts within the right frontal white matter and left frontal lobe cortex were better appreciated on the brain MRI of 11/13/2020. Background moderate patchy and ill-defined hypoattenuation within the cerebral white matter, nonspecific but compatible chronic small vessel ischemic disease. Redemonstrated small chronic cortical infarct within the anterior left frontal lobe. There is no acute intracranial hemorrhage. No extra-axial fluid collection. No evidence of an intracranial mass. No midline shift. Vascular: No hyperdense vessel.  Atherosclerotic calcifications. Skull: Normal. Negative for fracture or focal lesion. Sinuses: No significant paranasal sinus disease. Orbits: No mass or acute finding. Other: Small right mastoid effusion. Review of the MIP images confirms the above  findings CTA NECK FINDINGS Aortic arch: Standard aortic branching. The innominate artery origin is excluded from the field of view. Atherosclerotic plaque within the visualized aortic arch and proximal major branch vessels of the neck. Soft and calcified plaque within the proximal right subclavian artery results in 65% stenosis. No significant hemodynamically significant stenosis within the visualized innominate artery or proximal left subclavian artery. Right carotid system: CCA and ICA patent within the neck. Soft and calcified plaque within the proximal ICA with resultant 40% stenosis. Left carotid system: CCA and ICA patent within the neck without stenosis. Minimal soft plaque within the CCA. Mild calcified plaque at the carotid bifurcation. Vertebral arteries: Codominant and patent within the neck without stenosis. Skeleton: Cervical spondylosis with suspected degenerative fusion across the C4-C5 and C5-C6 disc spaces and multilevel facet joint ankylosis. No acute bony abnormality or aggressive osseous lesion. Other neck: No cervical lymphadenopathy. Multiple thyroid nodules, the largest within the right lobe measuring 2.0 cm. Fatty atrophy of the bilateral parotid glands. Upper chest: No consolidation within the imaged lung apices. Review of the MIP images confirms the above findings CTA HEAD FINDINGS Anterior circulation: The intracranial internal carotid arteries are patent. Calcified plaque within both vessels without stenosis. The M1 middle cerebral arteries are patent. Atherosclerotic irregularity of the M2 and more distal middle cerebral arteries  bilaterally. No M2 proximal branch occlusion or high-grade proximal stenosis is identified. The anterior cerebral arteries are patent. 1-2 mm inferiorly projecting vascular protrusion arising from the supraclinoid right ICA, which may reflect an aneurysm or infundibulum (series 12, image 95). Posterior circulation: The intracranial vertebral arteries are patent.  The basilar artery is patent. Mild atherosclerotic irregularity of these vessels without stenosis. The posterior cerebral arteries are patent. Mild atherosclerotic irregularity of both vessels without significant proximal stenosis. Posterior communicating arteries are hypoplastic or absent bilaterally. Venous sinuses: Within the limitations of contrast timing, no convincing thrombus. Anatomic variants: As described Review of the MIP images confirms the above findings IMPRESSION: CT head: 1. Known small acute infarcts within the right frontal lobe white matter and left frontal lobe cortex were better appreciated on the brain MRI of 11/13/2020. 2. Redemonstrated small chronic cortical infarct within the anterior left frontal lobe. 3. Background moderate cerebral white matter chronic small vessel ischemic disease. 4. Mild generalized cerebral atrophy. 5. Small right mastoid effusion. CTA neck: 1. Atherosclerotic plaque within the proximal right subclavian artery results in 65% stenosis. 2. The bilateral common carotid and internal carotid arteries are patent within the neck. Atherosclerotic plaque within the proximal right ICA results in 40% stenosis. Minimal atherosclerotic plaque within the left carotid system within the neck. 3. Vertebral arteries codominant and patent within the neck without stenosis. 4. Multiple thyroid nodules, the largest within the right lobe measuring 2 cm. A dedicated thyroid ultrasound may be obtained for further evaluation as clinically warranted (given the patient's advanced age). CTA head: 1. Atherosclerotic disease. No intracranial large vessel occlusion or proximal high-grade stenosis. 2. 1-2 mm inferiorly projecting vascular protrusion arising from the supraclinoid right ICA, which may reflect an aneurysm or infundibulum. Electronically Signed   By: Kellie Simmering DO   On: 11/14/2020 13:43   CT Head Wo Contrast  Result Date: 11/12/2020 CLINICAL DATA:  Delirium.  Confusion. EXAM: CT  HEAD WITHOUT CONTRAST TECHNIQUE: Contiguous axial images were obtained from the base of the skull through the vertex without intravenous contrast. COMPARISON:  Head CT 10/15/2015 FINDINGS: Brain: Stable degree of atrophy there is moderate chronic small vessel ischemia with progression from 2017. no intracranial hemorrhage, mass effect, or midline shift. No hydrocephalus. The basilar cisterns are patent. No evidence of territorial infarct or acute ischemia. No extra-axial or intracranial fluid collection. Vascular: Atherosclerosis of skullbase vasculature without hyperdense vessel or abnormal calcification. Skull: No fracture or focal lesion. Sinuses/Orbits: Minor mucosal thickening of ethmoid air cells. No sinus fluid levels. Hypoplastic right mastoid air cells. No significant mastoid effusion. Unremarkable orbits. Other: None. IMPRESSION: 1. No acute intracranial abnormality. 2. Generalized atrophy and chronic small vessel ischemia. Electronically Signed   By: Keith Rake M.D.   On: 11/12/2020 18:06   CT ANGIO NECK W OR WO CONTRAST  Result Date: 11/14/2020 CLINICAL DATA:  Neuro deficit, acute, stroke suspected. EXAM: CT ANGIOGRAPHY HEAD AND NECK TECHNIQUE: Multidetector CT imaging of the head and neck was performed using the standard protocol during bolus administration of intravenous contrast. Multiplanar CT image reconstructions and MIPs were obtained to evaluate the vascular anatomy. Carotid stenosis measurements (when applicable) are obtained utilizing NASCET criteria, using the distal internal carotid diameter as the denominator. CONTRAST:  167m OMNIPAQUE IOHEXOL 350 MG/ML SOLN COMPARISON:  MRI/MRA head 11/13/2020. Head CT 11/12/2020. FINDINGS: CT HEAD FINDINGS Brain: Mild generalized cerebral atrophy. Known small acute infarcts within the right frontal white matter and left frontal lobe cortex were better appreciated on the  brain MRI of 11/13/2020. Background moderate patchy and ill-defined  hypoattenuation within the cerebral white matter, nonspecific but compatible chronic small vessel ischemic disease. Redemonstrated small chronic cortical infarct within the anterior left frontal lobe. There is no acute intracranial hemorrhage. No extra-axial fluid collection. No evidence of an intracranial mass. No midline shift. Vascular: No hyperdense vessel.  Atherosclerotic calcifications. Skull: Normal. Negative for fracture or focal lesion. Sinuses: No significant paranasal sinus disease. Orbits: No mass or acute finding. Other: Small right mastoid effusion. Review of the MIP images confirms the above findings CTA NECK FINDINGS Aortic arch: Standard aortic branching. The innominate artery origin is excluded from the field of view. Atherosclerotic plaque within the visualized aortic arch and proximal major branch vessels of the neck. Soft and calcified plaque within the proximal right subclavian artery results in 65% stenosis. No significant hemodynamically significant stenosis within the visualized innominate artery or proximal left subclavian artery. Right carotid system: CCA and ICA patent within the neck. Soft and calcified plaque within the proximal ICA with resultant 40% stenosis. Left carotid system: CCA and ICA patent within the neck without stenosis. Minimal soft plaque within the CCA. Mild calcified plaque at the carotid bifurcation. Vertebral arteries: Codominant and patent within the neck without stenosis. Skeleton: Cervical spondylosis with suspected degenerative fusion across the C4-C5 and C5-C6 disc spaces and multilevel facet joint ankylosis. No acute bony abnormality or aggressive osseous lesion. Other neck: No cervical lymphadenopathy. Multiple thyroid nodules, the largest within the right lobe measuring 2.0 cm. Fatty atrophy of the bilateral parotid glands. Upper chest: No consolidation within the imaged lung apices. Review of the MIP images confirms the above findings CTA HEAD FINDINGS  Anterior circulation: The intracranial internal carotid arteries are patent. Calcified plaque within both vessels without stenosis. The M1 middle cerebral arteries are patent. Atherosclerotic irregularity of the M2 and more distal middle cerebral arteries bilaterally. No M2 proximal branch occlusion or high-grade proximal stenosis is identified. The anterior cerebral arteries are patent. 1-2 mm inferiorly projecting vascular protrusion arising from the supraclinoid right ICA, which may reflect an aneurysm or infundibulum (series 12, image 95). Posterior circulation: The intracranial vertebral arteries are patent. The basilar artery is patent. Mild atherosclerotic irregularity of these vessels without stenosis. The posterior cerebral arteries are patent. Mild atherosclerotic irregularity of both vessels without significant proximal stenosis. Posterior communicating arteries are hypoplastic or absent bilaterally. Venous sinuses: Within the limitations of contrast timing, no convincing thrombus. Anatomic variants: As described Review of the MIP images confirms the above findings IMPRESSION: CT head: 1. Known small acute infarcts within the right frontal lobe white matter and left frontal lobe cortex were better appreciated on the brain MRI of 11/13/2020. 2. Redemonstrated small chronic cortical infarct within the anterior left frontal lobe. 3. Background moderate cerebral white matter chronic small vessel ischemic disease. 4. Mild generalized cerebral atrophy. 5. Small right mastoid effusion. CTA neck: 1. Atherosclerotic plaque within the proximal right subclavian artery results in 65% stenosis. 2. The bilateral common carotid and internal carotid arteries are patent within the neck. Atherosclerotic plaque within the proximal right ICA results in 40% stenosis. Minimal atherosclerotic plaque within the left carotid system within the neck. 3. Vertebral arteries codominant and patent within the neck without stenosis. 4.  Multiple thyroid nodules, the largest within the right lobe measuring 2 cm. A dedicated thyroid ultrasound may be obtained for further evaluation as clinically warranted (given the patient's advanced age). CTA head: 1. Atherosclerotic disease. No intracranial large vessel occlusion or proximal  high-grade stenosis. 2. 1-2 mm inferiorly projecting vascular protrusion arising from the supraclinoid right ICA, which may reflect an aneurysm or infundibulum. Electronically Signed   By: Kellie Simmering DO   On: 11/14/2020 13:43   MR ANGIO HEAD WO CONTRAST  Result Date: 11/13/2020 CLINICAL DATA:  Mental status change.  Unknown cause. EXAM: MRI HEAD WITHOUT CONTRAST MRA HEAD WITHOUT CONTRAST TECHNIQUE: Multiplanar, multi-echo pulse sequences of the brain and surrounding structures were acquired without intravenous contrast. Angiographic images of the Circle of Willis were acquired using MRA technique without intravenous contrast. COMPARISON:  MRI/MRI October 24, 2015. CT head from yesterday. FINDINGS: MRI HEAD FINDINGS Brain: Acute infarct in the right frontal white matter (series 5, image 23). Additional small mild area of restricted diffusion in the left frontal cortex (series 5, image 21). No significant edema or mass effect. No acute hemorrhage. Additional faint focus of DWI hyperintensity in the more anterior left frontal cortex (series 5, image 24) is associated with an area of susceptibility artifact and favored to represent artifact from mineralization when correlating with prior CT head. No evidence of acute hemorrhage, hydrocephalus, mass lesion, midline shift, extra-axial fluid collection. On susceptibility weighted imaging there is subtle sulcal susceptibility artifact along the bifrontal convexities, suggestive of superficial siderosis. Moderate scattered T2/FLAIR hyperintensities within the white matter, nonspecific but most likely related to chronic microvascular ischemic disease. Mild for age atrophy. Vascular:  See below. Skull and upper cervical spine: Normal marrow signal. Sinuses/Orbits: Mild ethmoid air cell mucosal thickening. Unremarkable orbits. Other: Small bilateral mastoid effusions. MRA HEAD FINDINGS Anterior circulation: Bilateral ICAs, MCAs, in the ACAs are patent. Similar mild moderate stenosis of the left MCA origin. Suspected at least moderate stenosis of the right M1 MCA, which may be accentuated by motion and downward flow on this noncontrast MRA. Posterior circulation: Bilateral vertebral arteries, basilar artery, and posterior cerebral arteries are patent. Suspected at least moderate right vertebral artery stenosis. Bilateral PCAs are patent with likely mild right PCA stenosis. IMPRESSION: MRI: 1. 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 Possible additional small acute infarct in the left frontal cortex. No significant edema or mass effect. Given potential involvement of multiple vascular territories, consider a central embolic etiology. 2. 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 3. Moderate chronic microvascular disease and mild atrophy. MRA: 1. Motion limited evaluation with suspected at least moderate stenosis of the right M1 MCA and right intradural vertebral artery. 2. Mild-to-moderate left M1 MCA stenosis. Electronically Signed   By: Margaretha Sheffield MD   On: 11/13/2020 12:09   MR BRAIN WO CONTRAST  Result Date: 11/13/2020 CLINICAL DATA:  Mental status change.  Unknown cause. EXAM: MRI HEAD WITHOUT CONTRAST MRA HEAD WITHOUT CONTRAST TECHNIQUE: Multiplanar, multi-echo pulse sequences of the brain and surrounding structures were acquired without intravenous contrast. Angiographic images of the Circle of Willis were acquired using MRA technique without intravenous contrast. COMPARISON:  MRI/MRI October 24, 2015. CT head from yesterday. FINDINGS: MRI  HEAD FINDINGS Brain: Acute infarct in the right frontal white matter (series 5, image 23). Additional small mild area of restricted diffusion in the left frontal cortex (series 5, image 21). No significant edema or mass effect. No acute hemorrhage. Additional faint focus of DWI hyperintensity in the more anterior left frontal cortex (series 5, image 24) is associated with an area of susceptibility artifact and favored to represent artifact from mineralization when correlating  with prior CT head. No evidence of acute hemorrhage, hydrocephalus, mass lesion, midline shift, extra-axial fluid collection. On susceptibility weighted imaging there is subtle sulcal susceptibility artifact along the bifrontal convexities, suggestive of superficial siderosis. Moderate scattered T2/FLAIR hyperintensities within the white matter, nonspecific but most likely related to chronic microvascular ischemic disease. Mild for age atrophy. Vascular: See below. Skull and upper cervical spine: Normal marrow signal. Sinuses/Orbits: Mild ethmoid air cell mucosal thickening. Unremarkable orbits. Other: Small bilateral mastoid effusions. MRA HEAD FINDINGS Anterior circulation: Bilateral ICAs, MCAs, in the ACAs are patent. Similar mild moderate stenosis of the left MCA origin. Suspected at least moderate stenosis of the right M1 MCA, which may be accentuated by motion and downward flow on this noncontrast MRA. Posterior circulation: Bilateral vertebral arteries, basilar artery, and posterior cerebral arteries are patent. Suspected at least moderate right vertebral artery stenosis. Bilateral PCAs are patent with likely mild right PCA stenosis. IMPRESSION: MRI: 1. 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 Possible additional small acute infarct in the left frontal cortex. No significant edema or mass effect. Given potential involvement of multiple vascular territories, consider a central embolic etiology. 2. Suggestion of bifrontal  superficial siderosis on susceptibility weighting imaging, possibly the sequela of prior subarachnoid hemorrhage given no acute hemorrhage on recent CT head. 3. Moderate chronic microvascular disease and mild atrophy. MRA: 1. Motion limited evaluation with suspected at least moderate stenosis of the right M1 MCA and right intradural vertebral artery. 2. Mild-to-moderate left M1 MCA stenosis. Electronically Signed   By: Margaretha Sheffield MD   On: 11/13/2020 12:09   DG Chest Portable 1 View  Result Date: 11/12/2020 CLINICAL DATA:  Altered mental status EXAM: PORTABLE CHEST 1 VIEW COMPARISON:  12/14/2019 FINDINGS: The heart size and mediastinal contours are within normal limits. Both lungs are clear. Metallic device overlies the left perihilar region, likely external to the patient. The visualized skeletal structures are unremarkable. IMPRESSION: No active disease. Electronically Signed   By: Fidela Salisbury MD   On: 11/12/2020 18:28   ECHOCARDIOGRAM COMPLETE  Result Date: 11/13/2020    ECHOCARDIOGRAM REPORT   Patient Name:   Peggy Ramirez Date of Exam: 11/13/2020 Medical Rec #:  [id number]        Height:       60.0 in Accession #:    [id number]       Weight:       109.0 lb Date of Birth:  1933/12/16        BSA:          1.442 m Patient Age:    59 years         BP:           84/42 mmHg Patient Gender: F                HR:           67 bpm. Exam Location:  [facility] Procedure: 2D Echo, Cardiac Doppler and Color Doppler Indications:    Abnormal EKG  History:        Patient has prior history of Echocardiogram examinations, most                 recent 10/16/2015. Arrythmias:Abnormal EKG, Signs/Symptoms:Very                 altered mental status; Risk Factors:Hypertension and                 Dyslipidemia.  Sonographer:  Dustin Flock RDCS Referring Phys: [id number] [employer]MOK[employer]E IMPRESSIONS  1. Left ventricular ejection fraction, by estimation, is 60 to 65%. The left ventricle has normal function. The left  ventricle has no regional wall motion abnormalities. There is mild left ventricular hypertrophy. Left ventricular diastolic parameters are indeterminate.  2. Right ventricular systolic function is normal. The right ventricular size is normal. There is normal pulmonary artery systolic pressure. The estimated right ventricular systolic pressure is 123456 mmHg.  3. There is a trivial pericardial effusion that is circumferential.  4. The mitral valve is grossly normal. No evidence of mitral valve regurgitation.  5. The aortic valve is tricuspid. There is mild calcification of the aortic valve. Aortic valve regurgitation is trivial.  6. The inferior vena cava is normal in size with greater than 50% respiratory variability, suggesting right atrial pressure of 3 mmHg. FINDINGS  Left Ventricle: Left ventricular ejection fraction, by estimation, is 60 to 65%. The left ventricle has normal function. The left ventricle has no regional wall motion abnormalities. The left ventricular internal cavity size was normal in size. There is  mild left ventricular hypertrophy. Abnormal (paradoxical) septal motion, consistent with left bundle branch block. Left ventricular diastolic parameters are indeterminate. Right Ventricle: The right ventricular size is normal. No increase in right ventricular wall thickness. Right ventricular systolic function is normal. There is normal pulmonary artery systolic pressure. The tricuspid regurgitant velocity is 2.13 m/s, and  with an assumed right atrial pressure of 3 mmHg, the estimated right ventricular systolic pressure is 123456 mmHg. Left Atrium: Left atrial size was normal in size. Right Atrium: Right atrial size was normal in size. Pericardium: Trivial pericardial effusion is present. The pericardial effusion is circumferential. Mitral Valve: The mitral valve is grossly normal. No evidence of mitral valve regurgitation. Tricuspid Valve: The tricuspid valve is grossly normal. Tricuspid valve  regurgitation is trivial. Aortic Valve: The aortic valve is tricuspid. There is mild calcification of the aortic valve. Aortic valve regurgitation is trivial. Aortic regurgitation PHT measures 359 msec. Pulmonic Valve: The pulmonic valve was grossly normal. Pulmonic valve regurgitation is trivial. Aorta: The aortic root is normal in size and structure. Venous: The inferior vena cava is normal in size with greater than 50% respiratory variability, suggesting right atrial pressure of 3 mmHg. IAS/Shunts: No atrial level shunt detected by color flow Doppler.  LEFT VENTRICLE PLAX 2D LVIDd:         3.43 cm  Diastology LVIDs:         2.29 cm  LV e' medial:    6.83 cm/s LV PW:         1.25 cm  LV E/e' medial:  12.1 LV IVS:        1.25 cm  LV e' lateral:   8.27 cm/s LVOT diam:     1.90 cm  LV E/e' lateral: 10.0 LV SV:         54 LV SV Index:   37 LVOT Area:     2.84 cm  RIGHT VENTRICLE RV S prime:     14.90 cm/s RVOT diam:      2.60 cm TAPSE (M-mode): 1.5 cm LEFT ATRIUM             Index       RIGHT ATRIUM           Index LA diam:        2.70 cm 1.87 cm/m  RA Area:     10.70 cm LA Vol (A2C):  27.1 ml 18.79 ml/m RA Volume:   22.50 ml  15.60 ml/m LA Vol (A4C):   31.0 ml 21.50 ml/m LA Biplane Vol: 31.4 ml 21.77 ml/m  AORTIC VALVE LVOT Vmax:   105.00 cm/s LVOT Vmean:  69.100 cm/s LVOT VTI:    0.189 m AI PHT:      359 msec  AORTA Ao Root diam: 2.70 cm MITRAL VALVE                TRICUSPID VALVE MV Area (PHT): 5.88 cm     TR Peak grad:   18.1 mmHg MV Decel Time: 129 msec     TR Vmax:        213.00 cm/s MV E velocity: 82.40 cm/s MV A velocity: 120.00 cm/s  SHUNTS MV E/A ratio:  0.69         Systemic VTI:  0.19 m                             Systemic Diam: 1.90 cm                             Pulmonic Diam: 2.60 cm Rozann Lesches MD Electronically signed by Rozann Lesches MD Signature Date/Time: 11/13/2020/12:21:12 PM    Final    US THYROID  Result Date: 11/14/2020 CLINICAL DATA:  Incidental on CT. EXAM: THYROID  ULTRASOUND TECHNIQUE: Ultrasound examination of the thyroid gland and adjacent soft tissues was performed. COMPARISON:  CT scan performed earlier today FINDINGS: Parenchymal Echotexture: Mildly heterogenous Isthmus: 0.2 cm Right lobe: 5.4 x 2.3 x 1.3 cm Left lobe: 4.8 x 1.8 x 1.2 cm _________________________________________________________ Estimated total number of nodules >/= 1 cm: 3 Number of spongiform nodules >/=  2 cm not described below (TR1): 0 Number of mixed cystic and solid nodules >/= 1.5 cm not described below (TR2): 0 _________________________________________________________ Nodule # 1: 1.5 x 1.0 x 1.1 cm spongiform nodule in the right upper gland. This is a benign morphology and no further follow-up is required. _________________________________________________________ Nodule # 2: Location: Right; Mid Maximum size: 1.4 cm; Other 2 dimensions: 0.8 x 1.4 cm Composition: solid/almost completely solid (2) Echogenicity: hypoechoic (2) Shape: not taller-than-wide (0) Margins: smooth (0) Echogenic foci: none (0) ACR TI-RADS total points: 4. ACR TI-RADS risk category: TR4 (4-6 points). ACR TI-RADS recommendations: *Given size (>/= 1 - 1.4 cm) and appearance, a follow-up ultrasound in 1 year should be considered based on TI-RADS criteria. _________________________________________________________ Nodule # 4: Small isoechoic predominantly solid nodule in the left superior gland measures up to 1.3 cm. This nodule does not meet criteria for biopsy or dedicated imaging surveillance. _________________________________________________________ Multiple additional small subcentimeter cysts and nodules present scattered throughout the gland. None meet criteria to warrant further evaluation. IMPRESSION: 1. Heterogeneous, enlarged and multinodular thyroid gland most consistent with multinodular goiter. 2. A 1.4 cm TI-RADS category 4 nodule in the right mid gland meets criteria for imaging surveillance. Recommend follow-up  ultrasound in 1 year. 3. Additional bilateral thyroid nodules also noted incidentally but do not meet criteria to recommend further evaluation. The above is in keeping with the ACR TI-RADS recommendations - J Am Coll Radiol 2017;14:587-595. Electronically Signed   By: Jacqulynn Cadet M.D.   On: 11/14/2020 15:46    Subjective:   Patient was seen and examined 11/16/2020, 12:21 PM Patient stable today. No acute distress.  No issues overnight Stable for discharge.  Discharge  Exam:    Vitals:   11/14/20 2100 11/15/20 1340 11/15/20 2034 11/16/20 0401  BP: (!) 116/52 (!) 143/69 (!) 165/89 (!) 142/72  Pulse: 72 83 90 83  Resp: '16 16 19 18  '$ Temp: 98.2 F (36.8 C) 98.7 F (37.1 C) 99.2 F (37.3 C) 98.4 F (36.9 C)  TempSrc: Oral Oral Oral   SpO2: 98% 100% 100% 99%  Weight:      Height:       Seen and examined, remained stable General: Pt lying comfortably in bed & appears in no obvious distress. Cardiovascular: S1 & S2 heard, RRR, S1/S2 +. No murmurs, rubs, gallops or clicks. No JVD or pedal edema. Respiratory: Clear to auscultation without wheezing, rhonchi or crackles. No increased work of breathing. Abdominal:  Non-distended, non-tender & soft. No organomegaly or masses appreciated. Normal bowel sounds heard. CNS: Alert and oriented. No focal deficits. Extremities: no edema, no cyanosis      The results of significant diagnostics from this hospitalization (including imaging, microbiology, ancillary and laboratory) are listed below for reference.      Microbiology:   Recent Results (from the past 240 hour(s))  Resp Panel by RT-PCR (Flu A&B, Covid) Nasopharyngeal Swab     Status: None   Collection Time: 11/12/20  7:27 PM   Specimen: Nasopharyngeal Swab; Nasopharyngeal(NP) swabs in vial transport medium  Result Value Ref Range Status   SARS Coronavirus 2 by RT PCR NEGATIVE NEGATIVE Final    Comment: (NOTE) SARS-CoV-2 target nucleic acids are NOT DETECTED.  The SARS-CoV-2  RNA is generally detectable in upper respiratory specimens during the acute phase of infection. The lowest concentration of SARS-CoV-2 viral copies this assay can detect is 138 copies/mL. A negative result does not preclude SARS-Cov-2 infection and should not be used as the sole basis for treatment or other patient management decisions. A negative result may occur with  improper specimen collection/handling, submission of specimen other than nasopharyngeal swab, presence of viral mutation(s) within the areas targeted by this assay, and inadequate number of viral copies(<138 copies/mL). A negative result must be combined with clinical observations, patient history, and epidemiological information. The expected result is Negative.  Fact Sheet for Patients:  EntrepreneurPulse.com.au  Fact Sheet for Healthcare Providers:  IncredibleEmployment.be  This test is no t yet approved or cleared by the [place] FDA and  has been authorized for detection and/or diagnosis of SARS-CoV-2 by FDA under an Emergency Use Authorization (EUA). This EUA will remain  in effect (meaning this test can be used) for the duration of the COVID-19 declaration under Section 564(b)(1) of the Act, 21 U.S.C.section 360bbb-3(b)(1), unless the authorization is terminated  or revoked sooner.       Influenza A by PCR NEGATIVE NEGATIVE Final   Influenza B by PCR NEGATIVE NEGATIVE Final    Comment: (NOTE) The Xpert Xpress SARS-CoV-2/FLU/RSV plus assay is intended as an aid in the diagnosis of influenza from Nasopharyngeal swab specimens and should not be used as a sole basis for treatment. Nasal washings and aspirates are unacceptable for Xpert Xpress SARS-CoV-2/FLU/RSV testing.  Fact Sheet for Patients: EntrepreneurPulse.com.au  Fact Sheet for Healthcare Providers: IncredibleEmployment.be  This test is not yet approved or cleared by the  [place] FDA and has been authorized for detection and/or diagnosis of SARS-CoV-2 by FDA under an Emergency Use Authorization (EUA). This EUA will remain in effect (meaning this test can be used) for the duration of the COVID-19 declaration under Section 564(b)(1) of the Act, 21 U.S.C.  section 360bbb-3(b)(1), unless the authorization is terminated or revoked.  Performed at [facility], [address], [place], [place] [address]      Labs:   CBC: Recent Labs  Lab 11/12/20 1615 11/13/20 0411  WBC 4.0 6.8  NEUTROABS 2.4  --   HGB 14.0 13.0  HCT 41.8 38.5  MCV 97.7 97.0  PLT 229 [id number]   Basic Metabolic Panel: Recent Labs  Lab 11/12/20 1615 11/13/20 0411 11/14/20 0520  NA 137 137 138  K 3.8 3.3* 5.0  CL 102 102 104  CO2 '27 27 27  '$ GLUCOSE 92 119* 93  BUN '13 15 13  '$ CREATININE 0.69 0.79 0.75  CALCIUM 11.3* 10.6* 10.5*  MG  --  2.2  --   PHOS  --  2.1* 4.2   Liver Function Tests: Recent Labs  Lab 11/12/20 1615 11/13/20 0411 11/14/20 0520  AST 26 23  --   ALT 21 17  --   ALKPHOS 66 54  --   BILITOT 0.7 0.9  --   PROT 7.6 6.5  --   ALBUMIN 4.4 3.7 3.6   BNP (last 3 results) No results for input(s): BNP in the last 8760 hours. Cardiac Enzymes: No results for input(s): CKTOTAL, CKMB, CKMBINDEX, TROPONINI in the last 168 hours. CBG: Recent Labs  Lab 11/14/20 1749 11/14/20 2357 11/15/20 1054 11/15/20 1604 11/16/20 1107  GLUCAP 129* 139* 101* 89 91   Hgb A1c No results for input(s): HGBA1C in the last 72 hours. Lipid Profile No results for input(s): CHOL, HDL, LDLCALC, TRIG, CHOLHDL, LDLDIRECT in the last 72 hours. Thyroid function studies No results for input(s): TSH, T4TOTAL, T3FREE, THYROIDAB in the last 72 hours.  Invalid input(s): FREET3 Anemia work up No results for input(s): VITAMINB12, FOLATE, FERRITIN, TIBC, IRON, RETICCTPCT in the last 72 hours. Urinalysis    Component Value Date/Time   COLORURINE STRAW (A) 11/12/2020 1602    APPEARANCEUR CLEAR 11/12/2020 1602   LABSPEC 1.008 11/12/2020 1602   PHURINE 7.0 11/12/2020 1602   GLUCOSEU NEGATIVE 11/12/2020 1602   HGBUR NEGATIVE 11/12/2020 1602   BILIRUBINUR NEGATIVE 11/12/2020 1602   KETONESUR 5 (A) 11/12/2020 1602   PROTEINUR NEGATIVE 11/12/2020 1602   NITRITE NEGATIVE 11/12/2020 1602   LEUKOCYTESUR NEGATIVE 11/12/2020 1602         Time coordinating discharge: Over 45 minutes  SIGNED: Deatra James, MD, FACP, FHM. [facility],  Please use [email or web address] to Page If 7PM-7AM, please contact night-coverage [email or web address] 11/16/2020, 12:21 PM

## 2020-11-17 LAB — GLUCOSE, CAPILLARY
Glucose-Capillary: 95 mg/dL (ref 70–99)
Glucose-Capillary: 94 mg/dL (ref 70–99)
Glucose-Capillary: 88 mg/dL (ref 70–99)
Glucose-Capillary: 63 mg/dL — ABNORMAL LOW (ref 70–99)
Glucose-Capillary: 69 mg/dL — ABNORMAL LOW (ref 70–99)
Glucose-Capillary: 71 mg/dL (ref 70–99)
Glucose-Capillary: 130 mg/dL — ABNORMAL HIGH (ref 70–99)
Glucose-Capillary: 104 mg/dL — ABNORMAL HIGH (ref 70–99)

## 2020-11-17 MED ORDER — DOCUSATE SODIUM 100 MG PO CAPS
100.0000 mg | ORAL_CAPSULE | Freq: Two times a day (BID) | ORAL | Status: DC
  Administered 2020-11-17 – 2020-11-19 (×5): 100 mg via ORAL
  Filled 2020-11-17 (×5): qty 1

## 2020-11-17 NOTE — TOC Progression Note (Signed)
Transition of Care [facility]) - Progression Note    Patient Details  Name: Peggy Ramirez MRN: [id number] Date of Birth: [birth year]  Transition of Care [facility] CM/SW Contact  Natasha Bence, LCSW Phone Number: 11/17/2020, 1:46 PM  Clinical Narrative:    Ebony Hail with [facility] reported that she will be able to perform face to face visit for full bed offer Monday. CSW provided [facility] with patient's vaccination record. Family not agreeable to [place]. TOC to follow.    Expected Discharge Plan: IP Rehab Facility Barriers to Discharge: Continued Medical Work up, Orthoptist and Services Expected Discharge Plan: [place] In-house Referral: Clinical Social Work   Post Acute Care Choice: IP Rehab Living arrangements for the past 2 months: Single Family Home                                       Social Determinants of Health (SDOH) Interventions    Readmission Risk Interventions No flowsheet data found.

## 2020-11-17 NOTE — Progress Notes (Signed)
Physician Discharge Summary [facility]    Patient: Peggy Ramirez                   Admit date: 11/12/2020   DOB: 25-Jun-1933             Discharge date:11/17/2020/12:32 PM FY:[id number]                          PCP: Peggy Hilding, MD  Disposition: SNF  Recommendations for Outpatient Follow-up:   Follow up: With PCP in 2-3 weeks Follow with neurologist 1-2 weeks Continue currently recommended medication dual antiplatelet treatment, high-dose statins  Discharge Condition: Stable   Code Status:   Code Status: Full Code  Diet recommendation: Cardiac diet    The patient was seen and examined this morning, stable sitting up in chair no acute distress, no issues overnight. Awake alert oriented x4   Discharge Diagnoses:    Principal Problem:   Acute CVA (cerebrovascular accident) [facility]) Active Problems:   Essential hypertension   Chronic constipation   Altered mental status   GERD (gastroesophageal reflux disease)   Acute metabolic encephalopathy   History of Present Illness/ Hospital Course Peggy Ramirez Summary:   Brief Summary:- 87 y.o. female with medical history significant for Sjogren's, lupus, hypertension, hyperlipidemia, GERD, recurrent UTI and chronic constipation admitted on 11/12/2020 with acute strokes resulting in acute metabolic encephalopathy/psychosis  A/p 1)Acute CVA--- MRI brain and MRA Head on 11/13/2020 showed acute infarct in the right frontal white matter.  -Stable-no further focal neurological findings, patient back to baseline - Possible additional small acute infarct in the left frontal cortex. No significant edema or mass effect. Given potential involvement of multiple vascular territories, likely embolic etiology Neurologist recommended 30-day event monitor-cardiology was notified to assist  --TTE with preserved EF of 60 to 65%,  Awaiting official consult from Dr. Merlene Laughter the neurologist, recommended no TEE   -PT, OT and speech pathology  consult requested -Get CTA neck -pending as patient refused to get the study done yesterday -Continue Aspirin, Plavix  - for 1 month then monotherapy recommended -To continue high-dose statin  -TSH 1.1  -Status post PT/OT evaluation recommending CIR versus SNF.... Pending SNF approval  2)acute metabolic encephalopathy/psychosis- --Resolved back to baseline  due to #1 above, manage as above -Serum ammonia WNL, folate, B12 and TSH WNL -We will consider EEG per neurology recommendation --Confusion agitation at evening, night consistent with possibly sundowning -Neurology stating associated hallucination raises the possibility of Lewy body dementia As needed Xanax   3)H/o HTN-- hypotensive episode-- -BP stabilized -cortisol is not low, maintain adequate hydration   4)Hypokalemia/hypophosphatemia --- replace Kcl, replace phosphorous, magnesium is WNL  5)GERD--continue Protonix 40 mg daily  6) Thyroid mass -Normal TSH, free T4 Thyroid ultrasound: IMPRESSION: 1. Heterogeneous, enlarged and multinodular thyroid gland most consistent with multinodular goiter. 2. A 1.4 cm TI-RADS category 4 nodule in the right mid gland meets criteria for imaging surveillance. Recommend follow-up ultrasound in 1 year. 3. Additional bilateral thyroid nodules also noted incidentally but do not meet criteria to recommend further evaluation.   [place] insurance approval for SNF -- Patient was deemed too functional for CIR, SNF is considering     Disposition: The patient is from: Home              Anticipated d/c is to: SNF               Anticipated d/c date is: 1-2  days         Barriers: Clinically Stable-   Code Status :  -  Code Status: Full Code   Family Communication:    Discussed with son Peggy Ramirez at bedside on 11/16/2020  Consults  :  Neurology  DVT Prophylaxis  :   - SCDs   enoxaparin (LOVENOX) injection 40 mg Start: 11/12/20 2145 SCDs Start: 11/12/20 2046     Discharge  Instructions:     Allergies  Allergen Reactions   Atorvastatin Other (See Comments)    Muscle aches   Cellcept [Mycophenolate Mofetil] Other (See Comments)    unknown   Medrol [Methylprednisolone] Other (See Comments)    unknown   Mycophenolate Mofetil Other (See Comments)    unknown   Penicillins Itching   Sulfonamide Derivatives Nausea Only   Topiramate Other (See Comments)    unknown   Verapamil Other (See Comments)    Felt shakey   Levofloxacin Rash     Procedures /Studies:   CT ANGIO HEAD W OR WO CONTRAST  Result Date: 11/14/2020 CLINICAL DATA:  Neuro deficit, acute, stroke suspected. EXAM: CT ANGIOGRAPHY HEAD AND NECK TECHNIQUE: Multidetector CT imaging of the head and neck was performed using the standard protocol during bolus administration of intravenous contrast. Multiplanar CT image reconstructions and MIPs were obtained to evaluate the vascular anatomy. Carotid stenosis measurements (when applicable) are obtained utilizing NASCET criteria, using the distal internal carotid diameter as the denominator. CONTRAST:  138m OMNIPAQUE IOHEXOL 350 MG/ML SOLN COMPARISON:  MRI/MRA head 11/13/2020. Head CT 11/12/2020. FINDINGS: CT HEAD FINDINGS Brain: Mild generalized cerebral atrophy. Known small acute infarcts within the right frontal white matter and left frontal lobe cortex were better appreciated on the brain MRI of 11/13/2020. Background moderate patchy and ill-defined hypoattenuation within the cerebral white matter, nonspecific but compatible chronic small vessel ischemic disease. Redemonstrated small chronic cortical infarct within the anterior left frontal lobe. There is no acute intracranial hemorrhage. No extra-axial fluid collection. No evidence of an intracranial mass. No midline shift. Vascular: No hyperdense vessel.  Atherosclerotic calcifications. Skull: Normal. Negative for fracture or focal lesion. Sinuses: No significant paranasal sinus disease. Orbits: No mass or  acute finding. Other: Small right mastoid effusion. Review of the MIP images confirms the above findings CTA NECK FINDINGS Aortic arch: Standard aortic branching. The innominate artery origin is excluded from the field of view. Atherosclerotic plaque within the visualized aortic arch and proximal major branch vessels of the neck. Soft and calcified plaque within the proximal right subclavian artery results in 65% stenosis. No significant hemodynamically significant stenosis within the visualized innominate artery or proximal left subclavian artery. Right carotid system: CCA and ICA patent within the neck. Soft and calcified plaque within the proximal ICA with resultant 40% stenosis. Left carotid system: CCA and ICA patent within the neck without stenosis. Minimal soft plaque within the CCA. Mild calcified plaque at the carotid bifurcation. Vertebral arteries: Codominant and patent within the neck without stenosis. Skeleton: Cervical spondylosis with suspected degenerative fusion across the C4-C5 and C5-C6 disc spaces and multilevel facet joint ankylosis. No acute bony abnormality or aggressive osseous lesion. Other neck: No cervical lymphadenopathy. Multiple thyroid nodules, the largest within the right lobe measuring 2.0 cm. Fatty atrophy of the bilateral parotid glands. Upper chest: No consolidation within the imaged lung apices. Review of the MIP images confirms the above findings CTA HEAD FINDINGS Anterior circulation: The intracranial internal carotid arteries are patent. Calcified plaque within both vessels without stenosis. The M1 middle  cerebral arteries are patent. Atherosclerotic irregularity of the M2 and more distal middle cerebral arteries bilaterally. No M2 proximal branch occlusion or high-grade proximal stenosis is identified. The anterior cerebral arteries are patent. 1-2 mm inferiorly projecting vascular protrusion arising from the supraclinoid right ICA, which may reflect an aneurysm or  infundibulum (series 12, image 95). Posterior circulation: The intracranial vertebral arteries are patent. The basilar artery is patent. Mild atherosclerotic irregularity of these vessels without stenosis. The posterior cerebral arteries are patent. Mild atherosclerotic irregularity of both vessels without significant proximal stenosis. Posterior communicating arteries are hypoplastic or absent bilaterally. Venous sinuses: Within the limitations of contrast timing, no convincing thrombus. Anatomic variants: As described Review of the MIP images confirms the above findings IMPRESSION: CT head: 1. Known small acute infarcts within the right frontal lobe white matter and left frontal lobe cortex were better appreciated on the brain MRI of 11/13/2020. 2. Redemonstrated small chronic cortical infarct within the anterior left frontal lobe. 3. Background moderate cerebral white matter chronic small vessel ischemic disease. 4. Mild generalized cerebral atrophy. 5. Small right mastoid effusion. CTA neck: 1. Atherosclerotic plaque within the proximal right subclavian artery results in 65% stenosis. 2. The bilateral common carotid and internal carotid arteries are patent within the neck. Atherosclerotic plaque within the proximal right ICA results in 40% stenosis. Minimal atherosclerotic plaque within the left carotid system within the neck. 3. Vertebral arteries codominant and patent within the neck without stenosis. 4. Multiple thyroid nodules, the largest within the right lobe measuring 2 cm. A dedicated thyroid ultrasound may be obtained for further evaluation as clinically warranted (given the patient's advanced age). CTA head: 1. Atherosclerotic disease. No intracranial large vessel occlusion or proximal high-grade stenosis. 2. 1-2 mm inferiorly projecting vascular protrusion arising from the supraclinoid right ICA, which may reflect an aneurysm or infundibulum. Electronically Signed   By: Kellie Simmering DO   On:  11/14/2020 13:43   CT Head Wo Contrast  Result Date: 11/12/2020 CLINICAL DATA:  Delirium.  Confusion. EXAM: CT HEAD WITHOUT CONTRAST TECHNIQUE: Contiguous axial images were obtained from the base of the skull through the vertex without intravenous contrast. COMPARISON:  Head CT 10/15/2015 FINDINGS: Brain: Stable degree of atrophy there is moderate chronic small vessel ischemia with progression from 2017. no intracranial hemorrhage, mass effect, or midline shift. No hydrocephalus. The basilar cisterns are patent. No evidence of territorial infarct or acute ischemia. No extra-axial or intracranial fluid collection. Vascular: Atherosclerosis of skullbase vasculature without hyperdense vessel or abnormal calcification. Skull: No fracture or focal lesion. Sinuses/Orbits: Minor mucosal thickening of ethmoid air cells. No sinus fluid levels. Hypoplastic right mastoid air cells. No significant mastoid effusion. Unremarkable orbits. Other: None. IMPRESSION: 1. No acute intracranial abnormality. 2. Generalized atrophy and chronic small vessel ischemia. Electronically Signed   By: Keith Rake M.D.   On: 11/12/2020 18:06   CT ANGIO NECK W OR WO CONTRAST  Result Date: 11/14/2020 CLINICAL DATA:  Neuro deficit, acute, stroke suspected. EXAM: CT ANGIOGRAPHY HEAD AND NECK TECHNIQUE: Multidetector CT imaging of the head and neck was performed using the standard protocol during bolus administration of intravenous contrast. Multiplanar CT image reconstructions and MIPs were obtained to evaluate the vascular anatomy. Carotid stenosis measurements (when applicable) are obtained utilizing NASCET criteria, using the distal internal carotid diameter as the denominator. CONTRAST:  138m OMNIPAQUE IOHEXOL 350 MG/ML SOLN COMPARISON:  MRI/MRA head 11/13/2020. Head CT 11/12/2020. FINDINGS: CT HEAD FINDINGS Brain: Mild generalized cerebral atrophy. Known small acute infarcts within  the right frontal white matter and left frontal lobe  cortex were better appreciated on the brain MRI of 11/13/2020. Background moderate patchy and ill-defined hypoattenuation within the cerebral white matter, nonspecific but compatible chronic small vessel ischemic disease. Redemonstrated small chronic cortical infarct within the anterior left frontal lobe. There is no acute intracranial hemorrhage. No extra-axial fluid collection. No evidence of an intracranial mass. No midline shift. Vascular: No hyperdense vessel.  Atherosclerotic calcifications. Skull: Normal. Negative for fracture or focal lesion. Sinuses: No significant paranasal sinus disease. Orbits: No mass or acute finding. Other: Small right mastoid effusion. Review of the MIP images confirms the above findings CTA NECK FINDINGS Aortic arch: Standard aortic branching. The innominate artery origin is excluded from the field of view. Atherosclerotic plaque within the visualized aortic arch and proximal major branch vessels of the neck. Soft and calcified plaque within the proximal right subclavian artery results in 65% stenosis. No significant hemodynamically significant stenosis within the visualized innominate artery or proximal left subclavian artery. Right carotid system: CCA and ICA patent within the neck. Soft and calcified plaque within the proximal ICA with resultant 40% stenosis. Left carotid system: CCA and ICA patent within the neck without stenosis. Minimal soft plaque within the CCA. Mild calcified plaque at the carotid bifurcation. Vertebral arteries: Codominant and patent within the neck without stenosis. Skeleton: Cervical spondylosis with suspected degenerative fusion across the C4-C5 and C5-C6 disc spaces and multilevel facet joint ankylosis. No acute bony abnormality or aggressive osseous lesion. Other neck: No cervical lymphadenopathy. Multiple thyroid nodules, the largest within the right lobe measuring 2.0 cm. Fatty atrophy of the bilateral parotid glands. Upper chest: No consolidation  within the imaged lung apices. Review of the MIP images confirms the above findings CTA HEAD FINDINGS Anterior circulation: The intracranial internal carotid arteries are patent. Calcified plaque within both vessels without stenosis. The M1 middle cerebral arteries are patent. Atherosclerotic irregularity of the M2 and more distal middle cerebral arteries bilaterally. No M2 proximal branch occlusion or high-grade proximal stenosis is identified. The anterior cerebral arteries are patent. 1-2 mm inferiorly projecting vascular protrusion arising from the supraclinoid right ICA, which may reflect an aneurysm or infundibulum (series 12, image 95). Posterior circulation: The intracranial vertebral arteries are patent. The basilar artery is patent. Mild atherosclerotic irregularity of these vessels without stenosis. The posterior cerebral arteries are patent. Mild atherosclerotic irregularity of both vessels without significant proximal stenosis. Posterior communicating arteries are hypoplastic or absent bilaterally. Venous sinuses: Within the limitations of contrast timing, no convincing thrombus. Anatomic variants: As described Review of the MIP images confirms the above findings IMPRESSION: CT head: 1. Known small acute infarcts within the right frontal lobe white matter and left frontal lobe cortex were better appreciated on the brain MRI of 11/13/2020. 2. Redemonstrated small chronic cortical infarct within the anterior left frontal lobe. 3. Background moderate cerebral white matter chronic small vessel ischemic disease. 4. Mild generalized cerebral atrophy. 5. Small right mastoid effusion. CTA neck: 1. Atherosclerotic plaque within the proximal right subclavian artery results in 65% stenosis. 2. The bilateral common carotid and internal carotid arteries are patent within the neck. Atherosclerotic plaque within the proximal right ICA results in 40% stenosis. Minimal atherosclerotic plaque within the left carotid  system within the neck. 3. Vertebral arteries codominant and patent within the neck without stenosis. 4. Multiple thyroid nodules, the largest within the right lobe measuring 2 cm. A dedicated thyroid ultrasound may be obtained for further evaluation as clinically warranted (given the  patient's advanced age). CTA head: 1. Atherosclerotic disease. No intracranial large vessel occlusion or proximal high-grade stenosis. 2. 1-2 mm inferiorly projecting vascular protrusion arising from the supraclinoid right ICA, which may reflect an aneurysm or infundibulum. Electronically Signed   By: Kellie Simmering DO   On: 11/14/2020 13:43   MR ANGIO HEAD WO CONTRAST  Result Date: 11/13/2020 CLINICAL DATA:  Mental status change.  Unknown cause. EXAM: MRI HEAD WITHOUT CONTRAST MRA HEAD WITHOUT CONTRAST TECHNIQUE: Multiplanar, multi-echo pulse sequences of the brain and surrounding structures were acquired without intravenous contrast. Angiographic images of the Circle of Willis were acquired using MRA technique without intravenous contrast. COMPARISON:  MRI/MRI October 24, 2015. CT head from yesterday. FINDINGS: MRI HEAD FINDINGS Brain: Acute infarct in the right frontal white matter (series 5, image 23). Additional small mild area of restricted diffusion in the left frontal cortex (series 5, image 21). No significant edema or mass effect. No acute hemorrhage. Additional faint focus of DWI hyperintensity in the more anterior left frontal cortex (series 5, image 24) is associated with an area of susceptibility artifact and favored to represent artifact from mineralization when correlating with prior CT head. No evidence of acute hemorrhage, hydrocephalus, mass lesion, midline shift, extra-axial fluid collection. On susceptibility weighted imaging there is subtle sulcal susceptibility artifact along the bifrontal convexities, suggestive of superficial siderosis. Moderate scattered T2/FLAIR hyperintensities within the white matter,  nonspecific but most likely related to chronic microvascular ischemic disease. Mild for age atrophy. Vascular: See below. Skull and upper cervical spine: Normal marrow signal. Sinuses/Orbits: Mild ethmoid air cell mucosal thickening. Unremarkable orbits. Other: Small bilateral mastoid effusions. MRA HEAD FINDINGS Anterior circulation: Bilateral ICAs, MCAs, in the ACAs are patent. Similar mild moderate stenosis of the left MCA origin. Suspected at least moderate stenosis of the right M1 MCA, which may be accentuated by motion and downward flow on this noncontrast MRA. Posterior circulation: Bilateral vertebral arteries, basilar artery, and posterior cerebral arteries are patent. Suspected at least moderate right vertebral artery stenosis. Bilateral PCAs are patent with likely mild right PCA stenosis. IMPRESSION: MRI: 1. 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 Possible additional small acute infarct in the left frontal cortex. No significant edema or mass effect. Given potential involvement of multiple vascular territories, consider a central embolic etiology. 2. 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 3. Moderate chronic microvascular disease and mild atrophy. MRA: 1. Motion limited evaluation with suspected at least moderate stenosis of the right M1 MCA and right intradural vertebral artery. 2. Mild-to-moderate left M1 MCA stenosis. Electronically Signed   By: Margaretha Sheffield MD   On: 11/13/2020 12:09   MR BRAIN WO CONTRAST  Result Date: 11/13/2020 CLINICAL DATA:  Mental status change.  Unknown cause. EXAM: MRI HEAD WITHOUT CONTRAST MRA HEAD WITHOUT CONTRAST TECHNIQUE: Multiplanar, multi-echo pulse sequences of the brain and surrounding structures were acquired without intravenous contrast. Angiographic images of the Circle of Willis were acquired using MRA  technique without intravenous contrast. COMPARISON:  MRI/MRI October 24, 2015. CT head from yesterday. FINDINGS: MRI HEAD FINDINGS Brain: Acute infarct in the right frontal white matter (series 5, image 23). Additional small mild area of restricted diffusion in the left frontal cortex (series 5, image 21). No significant edema or mass effect. No acute hemorrhage. Additional faint focus of DWI hyperintensity in the more anterior left frontal cortex (series 5, image 24) is associated  with an area of susceptibility artifact and favored to represent artifact from mineralization when correlating with prior CT head. No evidence of acute hemorrhage, hydrocephalus, mass lesion, midline shift, extra-axial fluid collection. On susceptibility weighted imaging there is subtle sulcal susceptibility artifact along the bifrontal convexities, suggestive of superficial siderosis. Moderate scattered T2/FLAIR hyperintensities within the white matter, nonspecific but most likely related to chronic microvascular ischemic disease. Mild for age atrophy. Vascular: See below. Skull and upper cervical spine: Normal marrow signal. Sinuses/Orbits: Mild ethmoid air cell mucosal thickening. Unremarkable orbits. Other: Small bilateral mastoid effusions. MRA HEAD FINDINGS Anterior circulation: Bilateral ICAs, MCAs, in the ACAs are patent. Similar mild moderate stenosis of the left MCA origin. Suspected at least moderate stenosis of the right M1 MCA, which may be accentuated by motion and downward flow on this noncontrast MRA. Posterior circulation: Bilateral vertebral arteries, basilar artery, and posterior cerebral arteries are patent. Suspected at least moderate right vertebral artery stenosis. Bilateral PCAs are patent with likely mild right PCA stenosis. IMPRESSION: MRI: 1. 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 Possible additional small acute infarct in the left frontal cortex. No significant edema or mass effect. Given potential  involvement of multiple vascular territories, consider a central embolic etiology. 2. 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 3. Moderate chronic microvascular disease and mild atrophy. MRA: 1. Motion limited evaluation with suspected at least moderate stenosis of the right M1 MCA and right intradural vertebral artery. 2. Mild-to-moderate left M1 MCA stenosis. Electronically Signed   By: Margaretha Sheffield MD   On: 11/13/2020 12:09   DG Chest Portable 1 View  Result Date: 11/12/2020 CLINICAL DATA:  Altered mental status EXAM: PORTABLE CHEST 1 VIEW COMPARISON:  12/14/2019 FINDINGS: The heart size and mediastinal contours are within normal limits. Both lungs are clear. Metallic device overlies the left perihilar region, likely external to the patient. The visualized skeletal structures are unremarkable. IMPRESSION: No active disease. Electronically Signed   By: Fidela Salisbury MD   On: 11/12/2020 18:28   ECHOCARDIOGRAM COMPLETE  Result Date: 11/13/2020    ECHOCARDIOGRAM REPORT   Patient Name:   HATHAWAY HOTZ Date of Exam: 11/13/2020 Medical Rec #:  [id number]        Height:       60.0 in Accession #:    [id number]       Weight:       109.0 lb Date of Birth:  05-05-1933        BSA:          1.442 m Patient Age:    24 years         BP:           84/42 mmHg Patient Gender: F                HR:           67 bpm. Exam Location:  [facility] Procedure: 2D Echo, Cardiac Doppler and Color Doppler Indications:    Abnormal EKG  History:        Patient has prior history of Echocardiogram examinations, most                 recent 10/16/2015. Arrythmias:Abnormal EKG, Signs/Symptoms:Very                 altered mental status; Risk Factors:Hypertension and  Dyslipidemia.  Sonographer:    Dustin Flock RDCS Referring Phys: [place]  1. Left  ventricular ejection fraction, by estimation, is 60 to 65%. The left ventricle has normal function. The left ventricle has no regional wall motion abnormalities. There is mild left ventricular hypertrophy. Left ventricular diastolic parameters are indeterminate.  2. Right ventricular systolic function is normal. The right ventricular size is normal. There is normal pulmonary artery systolic pressure. The estimated right ventricular systolic pressure is 123456 mmHg.  3. There is a trivial pericardial effusion that is circumferential.  4. The mitral valve is grossly normal. No evidence of mitral valve regurgitation.  5. The aortic valve is tricuspid. There is mild calcification of the aortic valve. Aortic valve regurgitation is trivial.  6. The inferior vena cava is normal in size with greater than 50% respiratory variability, suggesting right atrial pressure of 3 mmHg. FINDINGS  Left Ventricle: Left ventricular ejection fraction, by estimation, is 60 to 65%. The left ventricle has normal function. The left ventricle has no regional wall motion abnormalities. The left ventricular internal cavity size was normal in size. There is  mild left ventricular hypertrophy. Abnormal (paradoxical) septal motion, consistent with left bundle branch block. Left ventricular diastolic parameters are indeterminate. Right Ventricle: The right ventricular size is normal. No increase in right ventricular wall thickness. Right ventricular systolic function is normal. There is normal pulmonary artery systolic pressure. The tricuspid regurgitant velocity is 2.13 m/s, and  with an assumed right atrial pressure of 3 mmHg, the estimated right ventricular systolic pressure is 123456 mmHg. Left Atrium: Left atrial size was normal in size. Right Atrium: Right atrial size was normal in size. Pericardium: Trivial pericardial effusion is present. The pericardial effusion is circumferential. Mitral Valve: The mitral valve is grossly normal. No evidence  of mitral valve regurgitation. Tricuspid Valve: The tricuspid valve is grossly normal. Tricuspid valve regurgitation is trivial. Aortic Valve: The aortic valve is tricuspid. There is mild calcification of the aortic valve. Aortic valve regurgitation is trivial. Aortic regurgitation PHT measures 359 msec. Pulmonic Valve: The pulmonic valve was grossly normal. Pulmonic valve regurgitation is trivial. Aorta: The aortic root is normal in size and structure. Venous: The inferior vena cava is normal in size with greater than 50% respiratory variability, suggesting right atrial pressure of 3 mmHg. IAS/Shunts: No atrial level shunt detected by color flow Doppler.  LEFT VENTRICLE PLAX 2D LVIDd:         3.43 cm  Diastology LVIDs:         2.29 cm  LV e' medial:    6.83 cm/s LV PW:         1.25 cm  LV E/e' medial:  12.1 LV IVS:        1.25 cm  LV e' lateral:   8.27 cm/s LVOT diam:     1.90 cm  LV E/e' lateral: 10.0 LV SV:         54 LV SV Index:   37 LVOT Area:     2.84 cm  RIGHT VENTRICLE RV S prime:     14.90 cm/s RVOT diam:      2.60 cm TAPSE (M-mode): 1.5 cm LEFT ATRIUM             Index       RIGHT ATRIUM           Index LA diam:        2.70 cm 1.87 cm/m  RA Area:  10.70 cm LA Vol (A2C):   27.1 ml 18.79 ml/m RA Volume:   22.50 ml  15.60 ml/m LA Vol (A4C):   31.0 ml 21.50 ml/m LA Biplane Vol: 31.4 ml 21.77 ml/m  AORTIC VALVE LVOT Vmax:   105.00 cm/s LVOT Vmean:  69.100 cm/s LVOT VTI:    0.189 m AI PHT:      359 msec  AORTA Ao Root diam: 2.70 cm MITRAL VALVE                TRICUSPID VALVE MV Area (PHT): 5.88 cm     TR Peak grad:   18.1 mmHg MV Decel Time: 129 msec     TR Vmax:        213.00 cm/s MV E velocity: 82.40 cm/s MV A velocity: 120.00 cm/s  SHUNTS MV E/A ratio:  0.69         Systemic VTI:  0.19 m                             Systemic Diam: 1.90 cm                             Pulmonic Diam: 2.60 cm Rozann Lesches MD Electronically signed by Rozann Lesches MD Signature Date/Time: 11/13/2020/12:21:12 PM     Final    US THYROID  Result Date: 11/14/2020 CLINICAL DATA:  Incidental on CT. EXAM: THYROID ULTRASOUND TECHNIQUE: Ultrasound examination of the thyroid gland and adjacent soft tissues was performed. COMPARISON:  CT scan performed earlier today FINDINGS: Parenchymal Echotexture: Mildly heterogenous Isthmus: 0.2 cm Right lobe: 5.4 x 2.3 x 1.3 cm Left lobe: 4.8 x 1.8 x 1.2 cm _________________________________________________________ Estimated total number of nodules >/= 1 cm: 3 Number of spongiform nodules >/=  2 cm not described below (TR1): 0 Number of mixed cystic and solid nodules >/= 1.5 cm not described below (TR2): 0 _________________________________________________________ Nodule # 1: 1.5 x 1.0 x 1.1 cm spongiform nodule in the right upper gland. This is a benign morphology and no further follow-up is required. _________________________________________________________ Nodule # 2: Location: Right; Mid Maximum size: 1.4 cm; Other 2 dimensions: 0.8 x 1.4 cm Composition: solid/almost completely solid (2) Echogenicity: hypoechoic (2) Shape: not taller-than-wide (0) Margins: smooth (0) Echogenic foci: none (0) ACR TI-RADS total points: 4. ACR TI-RADS risk category: TR4 (4-6 points). ACR TI-RADS recommendations: *Given size (>/= 1 - 1.4 cm) and appearance, a follow-up ultrasound in 1 year should be considered based on TI-RADS criteria. _________________________________________________________ Nodule # 4: Small isoechoic predominantly solid nodule in the left superior gland measures up to 1.3 cm. This nodule does not meet criteria for biopsy or dedicated imaging surveillance. _________________________________________________________ Multiple additional small subcentimeter cysts and nodules present scattered throughout the gland. None meet criteria to warrant further evaluation. IMPRESSION: 1. Heterogeneous, enlarged and multinodular thyroid gland most consistent with multinodular goiter. 2. A 1.4 cm TI-RADS  category 4 nodule in the right mid gland meets criteria for imaging surveillance. Recommend follow-up ultrasound in 1 year. 3. Additional bilateral thyroid nodules also noted incidentally but do not meet criteria to recommend further evaluation. The above is in keeping with the ACR TI-RADS recommendations - J Am Coll Radiol 2017;14:587-595. Electronically Signed   By: Jacqulynn Cadet M.D.   On: 11/14/2020 15:46    Subjective:   Patient was seen and examined 11/17/2020, 12:32 PM Patient stable today. No acute distress.  No  issues overnight Stable for discharge.  Discharge Exam:    Vitals:   11/16/20 2246 11/16/20 2259 11/17/20 0114 11/17/20 0551  BP:  (!) 147/63 (!) 123/54 120/64  Pulse:  81 78 73  Resp:  '18 18 18  '$ Temp: 99.2 F (37.3 C) 99.2 F (37.3 C) 98.5 F (36.9 C) 98.7 F (37.1 C)  TempSrc: Oral Oral Oral Oral  SpO2:  96% 98% 100%  Weight:      Height:         Physical Exam:   General:  Alert, oriented, cooperative, no distress;   HEENT:  Normocephalic, PERRL, otherwise with in Normal limits   Neuro:  CNII-XII intact. , normal motor and sensation, reflexes intact   Lungs:   Clear to auscultation BL, Respirations unlabored, no wheezes / crackles  Cardio:    S1/S2, RRR, No murmure, No Rubs or Gallops   Abdomen:   Soft, non-tender, bowel sounds active all four quadrants,  no guarding or peritoneal signs.  Muscular skeletal:  Limited exam - in bed, able to move all 4 extremities, Normal strength,  2+ pulses,  symmetric, No pitting edema  Skin:  Dry, warm to touch, negative for any Rashes,  Wounds: Please see nursing documentation          The results of significant diagnostics from this hospitalization (including imaging, microbiology, ancillary and laboratory) are listed below for reference.      Microbiology:   Recent Results (from the past 240 hour(s))  Resp Panel by RT-PCR (Flu A&B, Covid) Nasopharyngeal Swab     Status: None   Collection Time: 11/12/20   7:27 PM   Specimen: Nasopharyngeal Swab; Nasopharyngeal(NP) swabs in vial transport medium  Result Value Ref Range Status   SARS Coronavirus 2 by RT PCR NEGATIVE NEGATIVE Final    Comment: (NOTE) SARS-CoV-2 target nucleic acids are NOT DETECTED.  The SARS-CoV-2 RNA is generally detectable in upper respiratory specimens during the acute phase of infection. The lowest concentration of SARS-CoV-2 viral copies this assay can detect is 138 copies/mL. A negative result does not preclude SARS-Cov-2 infection and should not be used as the sole basis for treatment or other patient management decisions. A negative result may occur with  improper specimen collection/handling, submission of specimen other than nasopharyngeal swab, presence of viral mutation(s) within the areas targeted by this assay, and inadequate number of viral copies(<138 copies/mL). A negative result must be combined with clinical observations, patient history, and epidemiological information. The expected result is Negative.  Fact Sheet for Patients:  EntrepreneurPulse.com.au  Fact Sheet for Healthcare Providers:  IncredibleEmployment.be  This test is no t yet approved or cleared by the [place] FDA and  has been authorized for detection and/or diagnosis of SARS-CoV-2 by FDA under an Emergency Use Authorization (EUA). This EUA will remain  in effect (meaning this test can be used) for the duration of the COVID-19 declaration under Section 564(b)(1) of the Act, 21 U.S.C.section 360bbb-3(b)(1), unless the authorization is terminated  or revoked sooner.       Influenza A by PCR NEGATIVE NEGATIVE Final   Influenza B by PCR NEGATIVE NEGATIVE Final    Comment: (NOTE) The Xpert Xpress SARS-CoV-2/FLU/RSV plus assay is intended as an aid in the diagnosis of influenza from Nasopharyngeal swab specimens and should not be used as a sole basis for treatment. Nasal washings and aspirates  are unacceptable for Xpert Xpress SARS-CoV-2/FLU/RSV testing.  Fact Sheet for Patients: EntrepreneurPulse.com.au  Fact Sheet for Healthcare Providers: IncredibleEmployment.be  This test is not yet approved or cleared by the [place] and has been authorized for detection and/or diagnosis of SARS-CoV-2 by FDA under an Emergency Use Authorization (EUA). This EUA will remain in effect (meaning this test can be used) for the duration of the COVID-19 declaration under Section 564(b)(1) of the Act, 21 U.S.C. section 360bbb-3(b)(1), unless the authorization is terminated or revoked.  Performed at [facility], [address], [place], [place] [address]      Labs:   CBC: Recent Labs  Lab 11/12/20 1615 11/13/20 0411  WBC 4.0 6.8  NEUTROABS 2.4  --   HGB 14.0 13.0  HCT 41.8 38.5  MCV 97.7 97.0  PLT 229 [id number]   Basic Metabolic Panel: Recent Labs  Lab 11/12/20 1615 11/13/20 0411 11/14/20 0520  NA 137 137 138  K 3.8 3.3* 5.0  CL 102 102 104  CO2 '27 27 27  '$ GLUCOSE 92 119* 93  BUN '13 15 13  '$ CREATININE 0.69 0.79 0.75  CALCIUM 11.3* 10.6* 10.5*  MG  --  2.2  --   PHOS  --  2.1* 4.2   Liver Function Tests: Recent Labs  Lab 11/12/20 1615 11/13/20 0411 11/14/20 0520  AST 26 23  --   ALT 21 17  --   ALKPHOS 66 54  --   BILITOT 0.7 0.9  --   PROT 7.6 6.5  --   ALBUMIN 4.4 3.7 3.6   BNP (last 3 results) No results for input(s): BNP in the last 8760 hours. Cardiac Enzymes: No results for input(s): CKTOTAL, CKMB, CKMBINDEX, TROPONINI in the last 168 hours. CBG: Recent Labs  Lab 11/17/20 0554 11/17/20 0758 11/17/20 1113 11/17/20 1123 11/17/20 1146  GLUCAP 94 88 69* 63* 71   Hgb A1c No results for input(s): HGBA1C in the last 72 hours. Lipid Profile No results for input(s): CHOL, HDL, LDLCALC, TRIG, CHOLHDL, LDLDIRECT in the last 72 hours. Thyroid function studies No results for input(s): TSH, T4TOTAL, T3FREE, THYROIDAB  in the last 72 hours.  Invalid input(s): FREET3 Anemia work up No results for input(s): VITAMINB12, FOLATE, FERRITIN, TIBC, IRON, RETICCTPCT in the last 72 hours. Urinalysis    Component Value Date/Time   COLORURINE STRAW (A) 11/12/2020 1602   APPEARANCEUR CLEAR 11/12/2020 1602   LABSPEC 1.008 11/12/2020 1602   PHURINE 7.0 11/12/2020 1602   GLUCOSEU NEGATIVE 11/12/2020 1602   HGBUR NEGATIVE 11/12/2020 1602   BILIRUBINUR NEGATIVE 11/12/2020 1602   KETONESUR 5 (A) 11/12/2020 1602   PROTEINUR NEGATIVE 11/12/2020 1602   NITRITE NEGATIVE 11/12/2020 1602   LEUKOCYTESUR NEGATIVE 11/12/2020 1602         Time coordinating discharge: Over 45 minutes  SIGNED: Deatra James, MD, FACP, FHM. [facility],  Please use [email or web address] to Page If 7PM-7AM, please contact night-coverage [email or web address] [email or web address] 11/17/2020, 12:32 PM

## 2020-11-18 LAB — SARS CORONAVIRUS 2 (TAT 6-24 HRS): SARS Coronavirus 2: NEGATIVE

## 2020-11-18 MED ORDER — SENNOSIDES-DOCUSATE SODIUM 8.6-50 MG PO TABS
1.0000 | ORAL_TABLET | Freq: Two times a day (BID) | ORAL | Status: DC
  Administered 2020-11-18 – 2020-11-19 (×3): 1 via ORAL
  Filled 2020-11-18 (×3): qty 1

## 2020-11-18 MED ORDER — TRAZODONE HCL 50 MG PO TABS
50.0000 mg | ORAL_TABLET | Freq: Every day | ORAL | Status: DC
  Administered 2020-11-18: 50 mg via ORAL
  Filled 2020-11-18: qty 1

## 2020-11-18 NOTE — Progress Notes (Signed)
Physician Discharge Summary [facility]    Patient: Peggy Ramirez                   Admit date: 11/12/2020   DOB: 11/28/1933             Discharge date:11/18/2020/10:10 AM [id number]                          PCP: Manon Hilding, MD  Disposition: SNF -[facility] Recommendations for Outpatient Follow-up:   Follow up: With PCP in 2-3 weeks Follow with neurologist 1-2 weeks Continue currently recommended medication dual antiplatelet treatment, high-dose statins  Discharge Condition: Stable   Code Status:   Code Status: Full Code  Diet recommendation: Cardiac diet    The patient was seen and examined this morning, remained stable no acute distress, some insomnia overnight. Patient has been improved for SNF at [facility] -may be able to take her tomorrow   Discharge Diagnoses:    Principal Problem:   Acute CVA (cerebrovascular accident) [facility]) Active Problems:   Essential hypertension   Chronic constipation   Altered mental status   GERD (gastroesophageal reflux disease)   Acute metabolic encephalopathy   History of Present Illness/ Hospital Course [name] Summary:   Brief Summary:- 87 y.o. female with medical history significant for Sjogren's, lupus, hypertension, hyperlipidemia, GERD, recurrent UTI and chronic constipation admitted on 11/12/2020 with acute strokes resulting in acute metabolic encephalopathy/psychosis  A/p 1)Acute CVA--- MRI brain and MRA Head on 11/13/2020 showed acute infarct in the right frontal white matter.  -Stable-no further focal neurological findings, patient back to baseline - Possible additional small acute infarct in the left frontal cortex. No significant edema or mass effect. Given potential involvement of multiple vascular territories, likely embolic etiology Neurologist recommended 30-day event monitor-cardiology was notified to assist  --TTE with preserved EF of 60 to 65%,  Awaiting official consult from Dr. Merlene Laughter  the neurologist, recommended no TEE   -PT, OT and speech pathology consult requested -Get CTA neck -pending as patient refused to get the study done yesterday -Continue Aspirin, Plavix  - for 1 month then monotherapy recommended -To continue high-dose statin  -TSH 1.1  -Status post PT/OT evaluation recommending CIR versus SNF.... Pending SNF approval  2)acute metabolic encephalopathy/psychosis- --Resolved back to baseline  due to #1 above, manage as above -Serum ammonia WNL, folate, B12 and TSH WNL -We will consider EEG per neurology recommendation --Confusion agitation at evening, night consistent with possibly sundowning -Neurology stating associated hallucination raises the possibility of Lewy body dementia As needed Xanax   3)H/o HTN-- hypotensive episode-- -BP stabilized -cortisol is not low, maintain adequate hydration   4)Hypokalemia/hypophosphatemia --- replace Kcl, replace phosphorous, magnesium is WNL  5)GERD--continue Protonix 40 mg daily  6) Thyroid mass -Normal TSH, free T4 Thyroid ultrasound: IMPRESSION: 1. Heterogeneous, enlarged and multinodular thyroid gland most consistent with multinodular goiter. 2. A 1.4 cm TI-RADS category 4 nodule in the right mid gland meets criteria for imaging surveillance. Recommend follow-up ultrasound in 1 year. 3. Additional bilateral thyroid nodules also noted incidentally but do not meet criteria to recommend further evaluation.   [employer] insurance approval for SNF -- Patient was deemed too functional for CIR, SNF is considering     Disposition: The patient is from: Home              Anticipated d/c is to: SNF  Anticipated d/c date is: 1-2 days         Barriers: Clinically Stable-   Code Status :  -  Code Status: Full Code   Family Communication:    Discussed with son [name] at bedside on 11/16/2020  Consults  :  Neurology  DVT Prophylaxis  :   - SCDs   enoxaparin (LOVENOX) injection 40 mg  Start: 11/12/20 2145 SCDs Start: 11/12/20 2046     Discharge Instructions:     Allergies  Allergen Reactions   Atorvastatin Other (See Comments)    Muscle aches   Cellcept [Mycophenolate Mofetil] Other (See Comments)    unknown   Medrol [Methylprednisolone] Other (See Comments)    unknown   Mycophenolate Mofetil Other (See Comments)    unknown   Penicillins Itching   Sulfonamide Derivatives Nausea Only   Topiramate Other (See Comments)    unknown   Verapamil Other (See Comments)    Felt shakey   Levofloxacin Rash     Procedures /Studies:   CT ANGIO HEAD W OR WO CONTRAST  Result Date: 11/14/2020 CLINICAL DATA:  Neuro deficit, acute, stroke suspected. EXAM: CT ANGIOGRAPHY HEAD AND NECK TECHNIQUE: Multidetector CT imaging of the head and neck was performed using the standard protocol during bolus administration of intravenous contrast. Multiplanar CT image reconstructions and MIPs were obtained to evaluate the vascular anatomy. Carotid stenosis measurements (when applicable) are obtained utilizing NASCET criteria, using the distal internal carotid diameter as the denominator. CONTRAST:  158m OMNIPAQUE IOHEXOL 350 MG/ML SOLN COMPARISON:  MRI/MRA head 11/13/2020. Head CT 11/12/2020. FINDINGS: CT HEAD FINDINGS Brain: Mild generalized cerebral atrophy. Known small acute infarcts within the right frontal white matter and left frontal lobe cortex were better appreciated on the brain MRI of 11/13/2020. Background moderate patchy and ill-defined hypoattenuation within the cerebral white matter, nonspecific but compatible chronic small vessel ischemic disease. Redemonstrated small chronic cortical infarct within the anterior left frontal lobe. There is no acute intracranial hemorrhage. No extra-axial fluid collection. No evidence of an intracranial mass. No midline shift. Vascular: No hyperdense vessel.  Atherosclerotic calcifications. Skull: Normal. Negative for fracture or focal lesion.  Sinuses: No significant paranasal sinus disease. Orbits: No mass or acute finding. Other: Small right mastoid effusion. Review of the MIP images confirms the above findings CTA NECK FINDINGS Aortic arch: Standard aortic branching. The innominate artery origin is excluded from the field of view. Atherosclerotic plaque within the visualized aortic arch and proximal major branch vessels of the neck. Soft and calcified plaque within the proximal right subclavian artery results in 65% stenosis. No significant hemodynamically significant stenosis within the visualized innominate artery or proximal left subclavian artery. Right carotid system: CCA and ICA patent within the neck. Soft and calcified plaque within the proximal ICA with resultant 40% stenosis. Left carotid system: CCA and ICA patent within the neck without stenosis. Minimal soft plaque within the CCA. Mild calcified plaque at the carotid bifurcation. Vertebral arteries: Codominant and patent within the neck without stenosis. Skeleton: Cervical spondylosis with suspected degenerative fusion across the C4-C5 and C5-C6 disc spaces and multilevel facet joint ankylosis. No acute bony abnormality or aggressive osseous lesion. Other neck: No cervical lymphadenopathy. Multiple thyroid nodules, the largest within the right lobe measuring 2.0 cm. Fatty atrophy of the bilateral parotid glands. Upper chest: No consolidation within the imaged lung apices. Review of the MIP images confirms the above findings CTA HEAD FINDINGS Anterior circulation: The intracranial internal carotid arteries are patent. Calcified plaque within both vessels  without stenosis. The M1 middle cerebral arteries are patent. Atherosclerotic irregularity of the M2 and more distal middle cerebral arteries bilaterally. No M2 proximal branch occlusion or high-grade proximal stenosis is identified. The anterior cerebral arteries are patent. 1-2 mm inferiorly projecting vascular protrusion arising from the  supraclinoid right ICA, which may reflect an aneurysm or infundibulum (series 12, image 95). Posterior circulation: The intracranial vertebral arteries are patent. The basilar artery is patent. Mild atherosclerotic irregularity of these vessels without stenosis. The posterior cerebral arteries are patent. Mild atherosclerotic irregularity of both vessels without significant proximal stenosis. Posterior communicating arteries are hypoplastic or absent bilaterally. Venous sinuses: Within the limitations of contrast timing, no convincing thrombus. Anatomic variants: As described Review of the MIP images confirms the above findings IMPRESSION: CT head: 1. Known small acute infarcts within the right frontal lobe white matter and left frontal lobe cortex were better appreciated on the brain MRI of 11/13/2020. 2. Redemonstrated small chronic cortical infarct within the anterior left frontal lobe. 3. Background moderate cerebral white matter chronic small vessel ischemic disease. 4. Mild generalized cerebral atrophy. 5. Small right mastoid effusion. CTA neck: 1. Atherosclerotic plaque within the proximal right subclavian artery results in 65% stenosis. 2. The bilateral common carotid and internal carotid arteries are patent within the neck. Atherosclerotic plaque within the proximal right ICA results in 40% stenosis. Minimal atherosclerotic plaque within the left carotid system within the neck. 3. Vertebral arteries codominant and patent within the neck without stenosis. 4. Multiple thyroid nodules, the largest within the right lobe measuring 2 cm. A dedicated thyroid ultrasound may be obtained for further evaluation as clinically warranted (given the patient's advanced age). CTA head: 1. Atherosclerotic disease. No intracranial large vessel occlusion or proximal high-grade stenosis. 2. 1-2 mm inferiorly projecting vascular protrusion arising from the supraclinoid right ICA, which may reflect an aneurysm or infundibulum.  Electronically Signed   By: Kellie Simmering DO   On: 11/14/2020 13:43   CT Head Wo Contrast  Result Date: 11/12/2020 CLINICAL DATA:  Delirium.  Confusion. EXAM: CT HEAD WITHOUT CONTRAST TECHNIQUE: Contiguous axial images were obtained from the base of the skull through the vertex without intravenous contrast. COMPARISON:  Head CT 10/15/2015 FINDINGS: Brain: Stable degree of atrophy there is moderate chronic small vessel ischemia with progression from 2017. no intracranial hemorrhage, mass effect, or midline shift. No hydrocephalus. The basilar cisterns are patent. No evidence of territorial infarct or acute ischemia. No extra-axial or intracranial fluid collection. Vascular: Atherosclerosis of skullbase vasculature without hyperdense vessel or abnormal calcification. Skull: No fracture or focal lesion. Sinuses/Orbits: Minor mucosal thickening of ethmoid air cells. No sinus fluid levels. Hypoplastic right mastoid air cells. No significant mastoid effusion. Unremarkable orbits. Other: None. IMPRESSION: 1. No acute intracranial abnormality. 2. Generalized atrophy and chronic small vessel ischemia. Electronically Signed   By: Keith Rake M.D.   On: 11/12/2020 18:06   CT ANGIO NECK W OR WO CONTRAST  Result Date: 11/14/2020 CLINICAL DATA:  Neuro deficit, acute, stroke suspected. EXAM: CT ANGIOGRAPHY HEAD AND NECK TECHNIQUE: Multidetector CT imaging of the head and neck was performed using the standard protocol during bolus administration of intravenous contrast. Multiplanar CT image reconstructions and MIPs were obtained to evaluate the vascular anatomy. Carotid stenosis measurements (when applicable) are obtained utilizing NASCET criteria, using the distal internal carotid diameter as the denominator. CONTRAST:  198m OMNIPAQUE IOHEXOL 350 MG/ML SOLN COMPARISON:  MRI/MRA head 11/13/2020. Head CT 11/12/2020. FINDINGS: CT HEAD FINDINGS Brain: Mild generalized cerebral atrophy.  Known small acute infarcts within  the right frontal white matter and left frontal lobe cortex were better appreciated on the brain MRI of 11/13/2020. Background moderate patchy and ill-defined hypoattenuation within the cerebral white matter, nonspecific but compatible chronic small vessel ischemic disease. Redemonstrated small chronic cortical infarct within the anterior left frontal lobe. There is no acute intracranial hemorrhage. No extra-axial fluid collection. No evidence of an intracranial mass. No midline shift. Vascular: No hyperdense vessel.  Atherosclerotic calcifications. Skull: Normal. Negative for fracture or focal lesion. Sinuses: No significant paranasal sinus disease. Orbits: No mass or acute finding. Other: Small right mastoid effusion. Review of the MIP images confirms the above findings CTA NECK FINDINGS Aortic arch: Standard aortic branching. The innominate artery origin is excluded from the field of view. Atherosclerotic plaque within the visualized aortic arch and proximal major branch vessels of the neck. Soft and calcified plaque within the proximal right subclavian artery results in 65% stenosis. No significant hemodynamically significant stenosis within the visualized innominate artery or proximal left subclavian artery. Right carotid system: CCA and ICA patent within the neck. Soft and calcified plaque within the proximal ICA with resultant 40% stenosis. Left carotid system: CCA and ICA patent within the neck without stenosis. Minimal soft plaque within the CCA. Mild calcified plaque at the carotid bifurcation. Vertebral arteries: Codominant and patent within the neck without stenosis. Skeleton: Cervical spondylosis with suspected degenerative fusion across the C4-C5 and C5-C6 disc spaces and multilevel facet joint ankylosis. No acute bony abnormality or aggressive osseous lesion. Other neck: No cervical lymphadenopathy. Multiple thyroid nodules, the largest within the right lobe measuring 2.0 cm. Fatty atrophy of the  bilateral parotid glands. Upper chest: No consolidation within the imaged lung apices. Review of the MIP images confirms the above findings CTA HEAD FINDINGS Anterior circulation: The intracranial internal carotid arteries are patent. Calcified plaque within both vessels without stenosis. The M1 middle cerebral arteries are patent. Atherosclerotic irregularity of the M2 and more distal middle cerebral arteries bilaterally. No M2 proximal branch occlusion or high-grade proximal stenosis is identified. The anterior cerebral arteries are patent. 1-2 mm inferiorly projecting vascular protrusion arising from the supraclinoid right ICA, which may reflect an aneurysm or infundibulum (series 12, image 95). Posterior circulation: The intracranial vertebral arteries are patent. The basilar artery is patent. Mild atherosclerotic irregularity of these vessels without stenosis. The posterior cerebral arteries are patent. Mild atherosclerotic irregularity of both vessels without significant proximal stenosis. Posterior communicating arteries are hypoplastic or absent bilaterally. Venous sinuses: Within the limitations of contrast timing, no convincing thrombus. Anatomic variants: As described Review of the MIP images confirms the above findings IMPRESSION: CT head: 1. Known small acute infarcts within the right frontal lobe white matter and left frontal lobe cortex were better appreciated on the brain MRI of 11/13/2020. 2. Redemonstrated small chronic cortical infarct within the anterior left frontal lobe. 3. Background moderate cerebral white matter chronic small vessel ischemic disease. 4. Mild generalized cerebral atrophy. 5. Small right mastoid effusion. CTA neck: 1. Atherosclerotic plaque within the proximal right subclavian artery results in 65% stenosis. 2. The bilateral common carotid and internal carotid arteries are patent within the neck. Atherosclerotic plaque within the proximal right ICA results in 40% stenosis.  Minimal atherosclerotic plaque within the left carotid system within the neck. 3. Vertebral arteries codominant and patent within the neck without stenosis. 4. Multiple thyroid nodules, the largest within the right lobe measuring 2 cm. A dedicated thyroid ultrasound may be obtained for further evaluation  as clinically warranted (given the patient's advanced age). CTA head: 1. Atherosclerotic disease. No intracranial large vessel occlusion or proximal high-grade stenosis. 2. 1-2 mm inferiorly projecting vascular protrusion arising from the supraclinoid right ICA, which may reflect an aneurysm or infundibulum. Electronically Signed   By: Kellie Simmering DO   On: 11/14/2020 13:43   MR ANGIO HEAD WO CONTRAST  Result Date: 11/13/2020 CLINICAL DATA:  Mental status change.  Unknown cause. EXAM: MRI HEAD WITHOUT CONTRAST MRA HEAD WITHOUT CONTRAST TECHNIQUE: Multiplanar, multi-echo pulse sequences of the brain and surrounding structures were acquired without intravenous contrast. Angiographic images of the Circle of Willis were acquired using MRA technique without intravenous contrast. COMPARISON:  MRI/MRI October 24, 2015. CT head from yesterday. FINDINGS: MRI HEAD FINDINGS Brain: Acute infarct in the right frontal white matter (series 5, image 23). Additional small mild area of restricted diffusion in the left frontal cortex (series 5, image 21). No significant edema or mass effect. No acute hemorrhage. Additional faint focus of DWI hyperintensity in the more anterior left frontal cortex (series 5, image 24) is associated with an area of susceptibility artifact and favored to represent artifact from mineralization when correlating with prior CT head. No evidence of acute hemorrhage, hydrocephalus, mass lesion, midline shift, extra-axial fluid collection. On susceptibility weighted imaging there is subtle sulcal susceptibility artifact along the bifrontal convexities, suggestive of superficial siderosis. Moderate scattered  T2/FLAIR hyperintensities within the white matter, nonspecific but most likely related to chronic microvascular ischemic disease. Mild for age atrophy. Vascular: See below. Skull and upper cervical spine: Normal marrow signal. Sinuses/Orbits: Mild ethmoid air cell mucosal thickening. Unremarkable orbits. Other: Small bilateral mastoid effusions. MRA HEAD FINDINGS Anterior circulation: Bilateral ICAs, MCAs, in the ACAs are patent. Similar mild moderate stenosis of the left MCA origin. Suspected at least moderate stenosis of the right M1 MCA, which may be accentuated by motion and downward flow on this noncontrast MRA. Posterior circulation: Bilateral vertebral arteries, basilar artery, and posterior cerebral arteries are patent. Suspected at least moderate right vertebral artery stenosis. Bilateral PCAs are patent with likely mild right PCA stenosis. IMPRESSION: MRI: 1. 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 Possible additional small acute infarct in the left frontal cortex. No significant edema or mass effect. Given potential involvement of multiple vascular territories, consider a central embolic etiology. 2. 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 3. Moderate chronic microvascular disease and mild atrophy. MRA: 1. Motion limited evaluation with suspected at least moderate stenosis of the right M1 MCA and right intradural vertebral artery. 2. Mild-to-moderate left M1 MCA stenosis. Electronically Signed   By: Margaretha Sheffield MD   On: 11/13/2020 12:09   MR BRAIN WO CONTRAST  Result Date: 11/13/2020 CLINICAL DATA:  Mental status change.  Unknown cause. EXAM: MRI HEAD WITHOUT CONTRAST MRA HEAD WITHOUT CONTRAST TECHNIQUE: Multiplanar, multi-echo pulse sequences of the brain and surrounding structures were acquired without intravenous contrast. Angiographic images  of the Circle of Willis were acquired using MRA technique without intravenous contrast. COMPARISON:  MRI/MRI October 24, 2015. CT head from yesterday. FINDINGS: MRI HEAD FINDINGS Brain: Acute infarct in the right frontal white matter (series 5, image 23). Additional small mild area of restricted diffusion in the left frontal cortex (series 5, image 21). No significant edema or mass effect. No acute hemorrhage. Additional faint focus of DWI hyperintensity in the more anterior left frontal cortex (series  5, image 24) is associated with an area of susceptibility artifact and favored to represent artifact from mineralization when correlating with prior CT head. No evidence of acute hemorrhage, hydrocephalus, mass lesion, midline shift, extra-axial fluid collection. On susceptibility weighted imaging there is subtle sulcal susceptibility artifact along the bifrontal convexities, suggestive of superficial siderosis. Moderate scattered T2/FLAIR hyperintensities within the white matter, nonspecific but most likely related to chronic microvascular ischemic disease. Mild for age atrophy. Vascular: See below. Skull and upper cervical spine: Normal marrow signal. Sinuses/Orbits: Mild ethmoid air cell mucosal thickening. Unremarkable orbits. Other: Small bilateral mastoid effusions. MRA HEAD FINDINGS Anterior circulation: Bilateral ICAs, MCAs, in the ACAs are patent. Similar mild moderate stenosis of the left MCA origin. Suspected at least moderate stenosis of the right M1 MCA, which may be accentuated by motion and downward flow on this noncontrast MRA. Posterior circulation: Bilateral vertebral arteries, basilar artery, and posterior cerebral arteries are patent. Suspected at least moderate right vertebral artery stenosis. Bilateral PCAs are patent with likely mild right PCA stenosis. IMPRESSION: MRI: 1. 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 Possible additional small acute infarct in the left frontal cortex. No  significant edema or mass effect. Given potential involvement of multiple vascular territories, consider a central embolic etiology. 2. 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 3. Moderate chronic microvascular disease and mild atrophy. MRA: 1. Motion limited evaluation with suspected at least moderate stenosis of the right M1 MCA and right intradural vertebral artery. 2. Mild-to-moderate left M1 MCA stenosis. Electronically Signed   By: Margaretha Sheffield MD   On: 11/13/2020 12:09   DG Chest Portable 1 View  Result Date: 11/12/2020 CLINICAL DATA:  Altered mental status EXAM: PORTABLE CHEST 1 VIEW COMPARISON:  12/14/2019 FINDINGS: The heart size and mediastinal contours are within normal limits. Both lungs are clear. Metallic device overlies the left perihilar region, likely external to the patient. The visualized skeletal structures are unremarkable. IMPRESSION: No active disease. Electronically Signed   By: Fidela Salisbury MD   On: 11/12/2020 18:28   ECHOCARDIOGRAM COMPLETE  Result Date: 11/13/2020    ECHOCARDIOGRAM REPORT   Patient Name:   Peggy Ramirez Date of Exam: 11/13/2020 Medical Rec #:  [id number]        Height:       60.0 in Accession #:    [id number]       Weight:       109.0 lb Date of Birth:  13-Nov-1933        BSA:          1.442 m Patient Age:    47 years         BP:           84/42 mmHg Patient Gender: F                HR:           67 bpm. Exam Location:  [facility] Procedure: 2D Echo, Cardiac Doppler and Color Doppler Indications:    Abnormal EKG  History:        Patient has prior history of Echocardiogram examinations, most                 recent 10/16/2015. Arrythmias:Abnormal EKG, Signs/Symptoms:Very                 altered mental status; Risk Factors:Hypertension and  Dyslipidemia.  Sonographer:    Dustin Flock RDCS Referring  Phys: [place]  1. Left ventricular ejection fraction, by estimation, is 60 to 65%. The left ventricle has normal function. The left ventricle has no regional wall motion abnormalities. There is mild left ventricular hypertrophy. Left ventricular diastolic parameters are indeterminate.  2. Right ventricular systolic function is normal. The right ventricular size is normal. There is normal pulmonary artery systolic pressure. The estimated right ventricular systolic pressure is 123456 mmHg.  3. There is a trivial pericardial effusion that is circumferential.  4. The mitral valve is grossly normal. No evidence of mitral valve regurgitation.  5. The aortic valve is tricuspid. There is mild calcification of the aortic valve. Aortic valve regurgitation is trivial.  6. The inferior vena cava is normal in size with greater than 50% respiratory variability, suggesting right atrial pressure of 3 mmHg. FINDINGS  Left Ventricle: Left ventricular ejection fraction, by estimation, is 60 to 65%. The left ventricle has normal function. The left ventricle has no regional wall motion abnormalities. The left ventricular internal cavity size was normal in size. There is  mild left ventricular hypertrophy. Abnormal (paradoxical) septal motion, consistent with left bundle branch block. Left ventricular diastolic parameters are indeterminate. Right Ventricle: The right ventricular size is normal. No increase in right ventricular wall thickness. Right ventricular systolic function is normal. There is normal pulmonary artery systolic pressure. The tricuspid regurgitant velocity is 2.13 m/s, and  with an assumed right atrial pressure of 3 mmHg, the estimated right ventricular systolic pressure is 123456 mmHg. Left Atrium: Left atrial size was normal in size. Right Atrium: Right atrial size was normal in size. Pericardium: Trivial pericardial effusion is present. The pericardial effusion is circumferential. Mitral Valve:  The mitral valve is grossly normal. No evidence of mitral valve regurgitation. Tricuspid Valve: The tricuspid valve is grossly normal. Tricuspid valve regurgitation is trivial. Aortic Valve: The aortic valve is tricuspid. There is mild calcification of the aortic valve. Aortic valve regurgitation is trivial. Aortic regurgitation PHT measures 359 msec. Pulmonic Valve: The pulmonic valve was grossly normal. Pulmonic valve regurgitation is trivial. Aorta: The aortic root is normal in size and structure. Venous: The inferior vena cava is normal in size with greater than 50% respiratory variability, suggesting right atrial pressure of 3 mmHg. IAS/Shunts: No atrial level shunt detected by color flow Doppler.  LEFT VENTRICLE PLAX 2D LVIDd:         3.43 cm  Diastology LVIDs:         2.29 cm  LV e' medial:    6.83 cm/s LV PW:         1.25 cm  LV E/e' medial:  12.1 LV IVS:        1.25 cm  LV e' lateral:   8.27 cm/s LVOT diam:     1.90 cm  LV E/e' lateral: 10.0 LV SV:         54 LV SV Index:   37 LVOT Area:     2.84 cm  RIGHT VENTRICLE RV S prime:     14.90 cm/s RVOT diam:      2.60 cm TAPSE (M-mode): 1.5 cm LEFT ATRIUM             Index       RIGHT ATRIUM           Index LA diam:        2.70 cm 1.87 cm/m  RA Area:  10.70 cm LA Vol (A2C):   27.1 ml 18.79 ml/m RA Volume:   22.50 ml  15.60 ml/m LA Vol (A4C):   31.0 ml 21.50 ml/m LA Biplane Vol: 31.4 ml 21.77 ml/m  AORTIC VALVE LVOT Vmax:   105.00 cm/s LVOT Vmean:  69.100 cm/s LVOT VTI:    0.189 m AI PHT:      359 msec  AORTA Ao Root diam: 2.70 cm MITRAL VALVE                TRICUSPID VALVE MV Area (PHT): 5.88 cm     TR Peak grad:   18.1 mmHg MV Decel Time: 129 msec     TR Vmax:        213.00 cm/s MV E velocity: 82.40 cm/s MV A velocity: 120.00 cm/s  SHUNTS MV E/A ratio:  0.69         Systemic VTI:  0.19 m                             Systemic Diam: 1.90 cm                             Pulmonic Diam: 2.60 cm Rozann Lesches MD Electronically signed by Rozann Lesches MD  Signature Date/Time: 11/13/2020/12:21:12 PM    Final    US THYROID  Result Date: 11/14/2020 CLINICAL DATA:  Incidental on CT. EXAM: THYROID ULTRASOUND TECHNIQUE: Ultrasound examination of the thyroid gland and adjacent soft tissues was performed. COMPARISON:  CT scan performed earlier today FINDINGS: Parenchymal Echotexture: Mildly heterogenous Isthmus: 0.2 cm Right lobe: 5.4 x 2.3 x 1.3 cm Left lobe: 4.8 x 1.8 x 1.2 cm _________________________________________________________ Estimated total number of nodules >/= 1 cm: 3 Number of spongiform nodules >/=  2 cm not described below (TR1): 0 Number of mixed cystic and solid nodules >/= 1.5 cm not described below (TR2): 0 _________________________________________________________ Nodule # 1: 1.5 x 1.0 x 1.1 cm spongiform nodule in the right upper gland. This is a benign morphology and no further follow-up is required. _________________________________________________________ Nodule # 2: Location: Right; Mid Maximum size: 1.4 cm; Other 2 dimensions: 0.8 x 1.4 cm Composition: solid/almost completely solid (2) Echogenicity: hypoechoic (2) Shape: not taller-than-wide (0) Margins: smooth (0) Echogenic foci: none (0) ACR TI-RADS total points: 4. ACR TI-RADS risk category: TR4 (4-6 points). ACR TI-RADS recommendations: *Given size (>/= 1 - 1.4 cm) and appearance, a follow-up ultrasound in 1 year should be considered based on TI-RADS criteria. _________________________________________________________ Nodule # 4: Small isoechoic predominantly solid nodule in the left superior gland measures up to 1.3 cm. This nodule does not meet criteria for biopsy or dedicated imaging surveillance. _________________________________________________________ Multiple additional small subcentimeter cysts and nodules present scattered throughout the gland. None meet criteria to warrant further evaluation. IMPRESSION: 1. Heterogeneous, enlarged and multinodular thyroid gland most consistent with  multinodular goiter. 2. A 1.4 cm TI-RADS category 4 nodule in the right mid gland meets criteria for imaging surveillance. Recommend follow-up ultrasound in 1 year. 3. Additional bilateral thyroid nodules also noted incidentally but do not meet criteria to recommend further evaluation. The above is in keeping with the ACR TI-RADS recommendations - J Am Coll Radiol 2017;14:587-595. Electronically Signed   By: Jacqulynn Cadet M.D.   On: 11/14/2020 15:46    Subjective:   Patient was seen and examined 11/18/2020, 10:10 AM Patient stable today. No acute distress.  No  issues overnight Stable for discharge.  Discharge Exam:    Vitals:   11/17/20 0551 11/17/20 1403 11/17/20 2122 11/18/20 0610  BP: 120/64 118/60 (!) 147/76 (!) 140/59  Pulse: 73 85 87 71  Resp: '18 18 17 18  '$ Temp: 98.7 F (37.1 C) 99.4 F (37.4 C) 99 F (37.2 C) 98.5 F (36.9 C)  TempSrc: Oral Oral Oral Oral  SpO2: 100% 100% 100% 97%  Weight:      Height:          Physical Exam:   General:  Alert, oriented, cooperative, no distress;   HEENT:  Normocephalic, PERRL, otherwise with in Normal limits   Neuro:  CNII-XII intact. , normal motor and sensation, reflexes intact   Lungs:   Clear to auscultation BL, Respirations unlabored, no wheezes / crackles  Cardio:    S1/S2, RRR, No murmure, No Rubs or Gallops   Abdomen:   Soft, non-tender, bowel sounds active all four quadrants,  no guarding or peritoneal signs.  Muscular skeletal:  Limited exam - in bed, able to move all 4 extremities, Normal strength,  2+ pulses,  symmetric, No pitting edema  Skin:  Dry, warm to touch, negative for any Rashes,  Wounds: Please see nursing documentation            The results of significant diagnostics from this hospitalization (including imaging, microbiology, ancillary and laboratory) are listed below for reference.      Microbiology:   Recent Results (from the past 240 hour(s))  Resp Panel by RT-PCR (Flu A&B, Covid)  Nasopharyngeal Swab     Status: None   Collection Time: 11/12/20  7:27 PM   Specimen: Nasopharyngeal Swab; Nasopharyngeal(NP) swabs in vial transport medium  Result Value Ref Range Status   SARS Coronavirus 2 by RT PCR NEGATIVE NEGATIVE Final    Comment: (NOTE) SARS-CoV-2 target nucleic acids are NOT DETECTED.  The SARS-CoV-2 RNA is generally detectable in upper respiratory specimens during the acute phase of infection. The lowest concentration of SARS-CoV-2 viral copies this assay can detect is 138 copies/mL. A negative result does not preclude SARS-Cov-2 infection and should not be used as the sole basis for treatment or other patient management decisions. A negative result may occur with  improper specimen collection/handling, submission of specimen other than nasopharyngeal swab, presence of viral mutation(s) within the areas targeted by this assay, and inadequate number of viral copies(<138 copies/mL). A negative result must be combined with clinical observations, patient history, and epidemiological information. The expected result is Negative.  Fact Sheet for Patients:  EntrepreneurPulse.com.au  Fact Sheet for Healthcare Providers:  IncredibleEmployment.be  This test is no t yet approved or cleared by the [place] FDA and  has been authorized for detection and/or diagnosis of SARS-CoV-2 by FDA under an Emergency Use Authorization (EUA). This EUA will remain  in effect (meaning this test can be used) for the duration of the COVID-19 declaration under Section 564(b)(1) of the Act, 21 U.S.C.section 360bbb-3(b)(1), unless the authorization is terminated  or revoked sooner.       Influenza A by PCR NEGATIVE NEGATIVE Final   Influenza B by PCR NEGATIVE NEGATIVE Final    Comment: (NOTE) The Xpert Xpress SARS-CoV-2/FLU/RSV plus assay is intended as an aid in the diagnosis of influenza from Nasopharyngeal swab specimens and should not be  used as a sole basis for treatment. Nasal washings and aspirates are unacceptable for Xpert Xpress SARS-CoV-2/FLU/RSV testing.  Fact Sheet for Patients: EntrepreneurPulse.com.au  Fact Sheet for Healthcare  Providers: IncredibleEmployment.be  This test is not yet approved or cleared by the [place] and has been authorized for detection and/or diagnosis of SARS-CoV-2 by FDA under an Emergency Use Authorization (EUA). This EUA will remain in effect (meaning this test can be used) for the duration of the COVID-19 declaration under Section 564(b)(1) of the Act, 21 U.S.C. section 360bbb-3(b)(1), unless the authorization is terminated or revoked.  Performed at [facility], [address] [place], [place] [address]   SARS CORONAVIRUS 2 (TAT 6-24 HRS) Nasopharyngeal Nasopharyngeal Swab     Status: None   Collection Time: 11/17/20 12:05 PM   Specimen: Nasopharyngeal Swab  Result Value Ref Range Status   SARS Coronavirus 2 NEGATIVE NEGATIVE Final    Comment: (NOTE) SARS-CoV-2 target nucleic acids are NOT DETECTED.  The SARS-CoV-2 RNA is generally detectable in upper and lower respiratory specimens during the acute phase of infection. Negative results do not preclude SARS-CoV-2 infection, do not rule out co-infections with other pathogens, and should not be used as the sole basis for treatment or other patient management decisions. Negative results must be combined with clinical observations, patient history, and epidemiological information. The expected result is Negative.  Fact Sheet for Patients: SugarRoll.be  Fact Sheet for Healthcare Providers: https://www.woods-mathews.com/  This test is not yet approved or cleared by the [place] FDA and  has been authorized for detection and/or diagnosis of SARS-CoV-2 by FDA under an Emergency Use Authorization (EUA). This EUA will remain  in effect  (meaning this test can be used) for the duration of the COVID-19 declaration under Se ction 564(b)(1) of the Act, 21 U.S.C. section 360bbb-3(b)(1), unless the authorization is terminated or revoked sooner.  Performed at [facility], [place] [address] [place], [place] [address]      Labs:   CBC: Recent Labs  Lab 11/12/20 1615 11/13/20 0411  WBC 4.0 6.8  NEUTROABS 2.4  --   HGB 14.0 13.0  HCT 41.8 38.5  MCV 97.7 97.0  PLT 229 [id number]   Basic Metabolic Panel: Recent Labs  Lab 11/12/20 1615 11/13/20 0411 11/14/20 0520  NA 137 137 138  K 3.8 3.3* 5.0  CL 102 102 104  CO2 '27 27 27  '$ GLUCOSE 92 119* 93  BUN '13 15 13  '$ CREATININE 0.69 0.79 0.75  CALCIUM 11.3* 10.6* 10.5*  MG  --  2.2  --   PHOS  --  2.1* 4.2   Liver Function Tests: Recent Labs  Lab 11/12/20 1615 11/13/20 0411 11/14/20 0520  AST 26 23  --   ALT 21 17  --   ALKPHOS 66 54  --   BILITOT 0.7 0.9  --   PROT 7.6 6.5  --   ALBUMIN 4.4 3.7 3.6   BNP (last 3 results) No results for input(s): BNP in the last 8760 hours. Cardiac Enzymes: No results for input(s): CKTOTAL, CKMB, CKMBINDEX, TROPONINI in the last 168 hours. CBG: Recent Labs  Lab 11/17/20 1113 11/17/20 1123 11/17/20 1146 11/17/20 1310 11/17/20 1605  GLUCAP 69* 63* 71 130* 104*   Hgb A1c No results for input(s): HGBA1C in the last 72 hours. Lipid Profile No results for input(s): CHOL, HDL, LDLCALC, TRIG, CHOLHDL, LDLDIRECT in the last 72 hours. Thyroid function studies No results for input(s): TSH, T4TOTAL, T3FREE, THYROIDAB in the last 72 hours.  Invalid input(s): FREET3 Anemia work up No results for input(s): VITAMINB12, FOLATE, FERRITIN, TIBC, IRON, RETICCTPCT in the last 72 hours. Urinalysis  Component Value Date/Time   COLORURINE STRAW (A) 11/12/2020 1602   APPEARANCEUR CLEAR 11/12/2020 1602   LABSPEC 1.008 11/12/2020 1602   PHURINE 7.0 11/12/2020 1602   GLUCOSEU NEGATIVE 11/12/2020 1602   HGBUR NEGATIVE 11/12/2020  1602   BILIRUBINUR NEGATIVE 11/12/2020 1602   KETONESUR 5 (A) 11/12/2020 1602   PROTEINUR NEGATIVE 11/12/2020 1602   NITRITE NEGATIVE 11/12/2020 1602   LEUKOCYTESUR NEGATIVE 11/12/2020 1602         Time coordinating discharge: Over 45 minutes  SIGNED: Deatra James, MD, FACP, FHM. [facility],  Please use [email or web address] to Page If 7PM-7AM, please contact night-coverage [email or web address], Password [place] 11/18/2020, 10:10 AM

## 2020-11-18 NOTE — Plan of Care (Signed)
  Problem: Education: Goal: Knowledge of General Education information will improve Description Including pain rating scale, medication(s)/side effects and non-pharmacologic comfort measures Outcome: Progressing   Problem: Health Behavior/Discharge Planning: Goal: Ability to manage health-related needs will improve Outcome: Progressing   

## 2020-11-19 DIAGNOSIS — K5904 Chronic idiopathic constipation: Secondary | ICD-10-CM | POA: Diagnosis not present

## 2020-11-19 DIAGNOSIS — E559 Vitamin D deficiency, unspecified: Secondary | ICD-10-CM | POA: Diagnosis not present

## 2020-11-19 DIAGNOSIS — R002 Palpitations: Secondary | ICD-10-CM | POA: Diagnosis not present

## 2020-11-19 DIAGNOSIS — R030 Elevated blood-pressure reading, without diagnosis of hypertension: Secondary | ICD-10-CM | POA: Diagnosis not present

## 2020-11-19 DIAGNOSIS — R278 Other lack of coordination: Secondary | ICD-10-CM | POA: Diagnosis not present

## 2020-11-19 DIAGNOSIS — E041 Nontoxic single thyroid nodule: Secondary | ICD-10-CM | POA: Diagnosis not present

## 2020-11-19 DIAGNOSIS — E785 Hyperlipidemia, unspecified: Secondary | ICD-10-CM | POA: Diagnosis not present

## 2020-11-19 DIAGNOSIS — I639 Cerebral infarction, unspecified: Secondary | ICD-10-CM | POA: Diagnosis not present

## 2020-11-19 DIAGNOSIS — R488 Other symbolic dysfunctions: Secondary | ICD-10-CM | POA: Diagnosis not present

## 2020-11-19 DIAGNOSIS — G4700 Insomnia, unspecified: Secondary | ICD-10-CM | POA: Diagnosis not present

## 2020-11-19 DIAGNOSIS — K219 Gastro-esophageal reflux disease without esophagitis: Secondary | ICD-10-CM | POA: Diagnosis not present

## 2020-11-19 DIAGNOSIS — E048 Other specified nontoxic goiter: Secondary | ICD-10-CM | POA: Diagnosis not present

## 2020-11-19 DIAGNOSIS — F32A Depression, unspecified: Secondary | ICD-10-CM | POA: Diagnosis not present

## 2020-11-19 DIAGNOSIS — I69318 Other symptoms and signs involving cognitive functions following cerebral infarction: Secondary | ICD-10-CM | POA: Diagnosis not present

## 2020-11-19 DIAGNOSIS — G9341 Metabolic encephalopathy: Secondary | ICD-10-CM | POA: Diagnosis not present

## 2020-11-19 DIAGNOSIS — R42 Dizziness and giddiness: Secondary | ICD-10-CM | POA: Diagnosis not present

## 2020-11-19 DIAGNOSIS — M6281 Muscle weakness (generalized): Secondary | ICD-10-CM | POA: Diagnosis not present

## 2020-11-19 DIAGNOSIS — D750 Familial erythrocytosis: Secondary | ICD-10-CM | POA: Diagnosis not present

## 2020-11-19 DIAGNOSIS — I69314 Frontal lobe and executive function deficit following cerebral infarction: Secondary | ICD-10-CM | POA: Diagnosis not present

## 2020-11-19 DIAGNOSIS — R262 Difficulty in walking, not elsewhere classified: Secondary | ICD-10-CM | POA: Diagnosis not present

## 2020-11-19 LAB — CREATININE, SERUM
Creatinine, Ser: 0.67 mg/dL (ref 0.44–1.00)
GFR, Estimated: >60 mL/min (ref >60)

## 2020-11-19 LAB — GLUCOSE, CAPILLARY
Glucose-Capillary: 92 mg/dL (ref 70–99)
Glucose-Capillary: 94 mg/dL (ref 70–99)

## 2020-11-19 MED ORDER — MINERAL OIL RE ENEM: 1.0000 | ENEMA | Freq: Once | RECTAL | Status: DC

## 2020-11-19 MED ORDER — TRAZODONE HCL 50 MG PO TABS: 50.0000 mg | ORAL_TABLET | Freq: Every day | ORAL | 0 refills | Status: DC

## 2020-11-19 MED ORDER — SIMVASTATIN 20 MG PO TABS: 20.0000 mg | ORAL_TABLET | Freq: Every day | ORAL | 1 refills | Status: DC

## 2020-11-19 MED ORDER — ALPRAZOLAM 0.5 MG PO TABS: 0.5000 mg | ORAL_TABLET | Freq: Three times a day (TID) | ORAL | 0 refills | Status: AC | PRN

## 2020-11-19 MED ORDER — DOCUSATE SODIUM 100 MG PO CAPS: 100.0000 mg | ORAL_CAPSULE | Freq: Two times a day (BID) | ORAL | 0 refills | Status: DC

## 2020-11-19 MED ORDER — EZETIMIBE 10 MG PO TABS: 10.0000 mg | ORAL_TABLET | Freq: Every day | ORAL | 0 refills | Status: DC

## 2020-11-19 MED ORDER — CLOPIDOGREL BISULFATE 75 MG PO TABS: 75.0000 mg | ORAL_TABLET | Freq: Every day | ORAL | 0 refills | Status: DC

## 2020-11-19 NOTE — Discharge Summary (Deleted)
Physician Discharge Summary [facility]     Patient: Peggy Ramirez                                                       Admit date: 11/12/2020   DOB: 1933-05-23                                                            Discharge date:11/19/2020/9:42 AM FY:[id number]                                                                                                                                                                                      PCP: Manon Hilding, MD   Disposition: SNF -[facility] Recommendations for Outpatient Follow-up:    Follow up: With PCP in 2-3 weeks Follow with neurologist 1-2 weeks Continue currently recommended medication dual antiplatelet treatment, high-dose statins   Discharge Condition: Stable   Code Status:   Code Status: Full Code   Diet recommendation: Cardiac diet       The patient was seen and examined this morning, stable no acute distress.  Cleared for discharge to SNF. The prior discussed in detail the patient she seems to understand and agree with the plan.     Discharge Diagnoses:      Principal Problem:   Acute CVA (cerebrovascular accident) [facility]) Active Problems:   Essential hypertension   Chronic constipation   Altered mental status   GERD (gastroesophageal reflux disease)   Acute metabolic encephalopathy     History of Present Illness/ Hospital Course Peggy Ramirez Summary:    Brief Summary:- 87 y.o. female with medical history significant for Sjogren's, lupus, hypertension, hyperlipidemia, GERD, recurrent UTI and chronic constipation admitted on 11/12/2020 with acute strokes resulting in acute metabolic encephalopathy/psychosis   A/p 1)Acute CVA--- MRI brain and MRA Head on 11/13/2020 showed acute infarct in the right frontal white matter. -Remained stable, no new focal neurological findings - Possible additional small acute infarct in the left frontal cortex. No significant edema or mass effect. Given potential  involvement of multiple vascular territories, likely embolic etiology Neurologist recommended 30-day event monitor-cardiology was notified to assist   --TTE with preserved EF of 60 to 65%, Awaiting official consult from Dr. Merlene Laughter the neurologist, recommended no TEE   -PT,  OT and speech pathology consult requested -Get CTA neck -pending as patient refused to get the study done yesterday -Continue Aspirin, Plavix  - for 1 month then monotherapy recommended -To continue high-dose statin -TSH 1.1   -Status post PT/OT evaluation recommending CIR versus SNF.... Pending SNF approval   2)acute metabolic encephalopathy/psychosis- --Resolved back to baseline  due to #1 above, manage as above -Serum ammonia WNL, folate, B12 and TSH WNL -We will consider EEG per neurology recommendation --Confusion agitation at evening, night consistent with possibly sundowning -Neurology stating associated hallucination raises the possibility of Lewy body dementia As needed Xanax   3)H/o HTN-- hypotensive episode-- -BP stabilized -cortisol is not low, maintain adequate hydration     4)Hypokalemia/hypophosphatemia --- replace Kcl, replace phosphorous, magnesium is WNL   5)GERD--continue Protonix 40 mg daily   6) Thyroid mass -Normal TSH, free T4 Thyroid ultrasound: IMPRESSION: 1. Heterogeneous, enlarged and multinodular thyroid gland most consistent with multinodular goiter. 2. A 1.4 cm TI-RADS category 4 nodule in the right mid gland meets criteria for imaging surveillance. Recommend follow-up ultrasound in 1 year. 3. Additional bilateral thyroid nodules also noted incidentally but do not meet criteria to recommend further evaluation.   Disposition: The patient is from: Home              Anticipated d/c is to: SNF                    Barriers: Clinically Stable-   Code Status :  -  Code Status: Full Code   Family Communication:    Discussed with son [name] at bedside on 11/16/2020   Consults   :  Neurology        Discharge Instructions:   Discharge Instructions     Activity as tolerated - No restrictions   Complete by: As directed    Call MD for:  difficulty breathing, headache or visual disturbances   Complete by: As directed    Call MD for:  extreme fatigue   Complete by: As directed    Call MD for:  hives   Complete by: As directed    Call MD for:  persistant dizziness or light-headedness   Complete by: As directed    Call MD for:  persistant nausea and vomiting   Complete by: As directed    Call MD for:  redness, tenderness, or signs of infection (pain, swelling, redness, odor or green/yellow discharge around incision site)   Complete by: As directed    Call MD for:  severe uncontrolled pain   Complete by: As directed    Call MD for:  temperature >100.4   Complete by: As directed    Diet - low sodium heart healthy   Complete by: As directed    Discharge instructions   Complete by: As directed    Follow-up with PCP in 1 week, follow-up with neurology in 2 weeks   Increase activity slowly   Complete by: As directed         Medication List     STOP taking these medications    fluconazole 100 MG tablet Commonly known as: DIFLUCAN   hydrochlorothiazide 12.5 MG capsule Commonly known as: MICROZIDE   HYDROcodone-acetaminophen 5-325 MG tablet Commonly known as: NORCO/VICODIN   ketoconazole 2 % cream Commonly known as: NIZORAL   ondansetron 4 MG tablet Commonly known as: ZOFRAN   pantoprazole 40 MG tablet Commonly known as: PROTONIX   potassium chloride 10 MEQ tablet Commonly known as: KLOR-CON  triamcinolone cream 0.1 % Commonly known as: KENALOG   vitamin C 1000 MG tablet   zinc gluconate 50 MG tablet       TAKE these medications    3-in-1 Bedside Toilet Misc Patient needs 3 in 1 bedside toilet. Recent hip fracture with repair   ALPRAZolam 0.5 MG tablet Commonly known as: XANAX Take 1 tablet (0.5 mg total) by mouth 3 (three)  times daily as needed for anxiety.   aspirin EC 81 MG tablet Take 81 mg by mouth daily. Swallow whole.   carboxymethylcellulose 1 % ophthalmic solution Apply 1 drop to eye daily as needed.   clopidogrel 75 MG tablet Commonly known as: PLAVIX Take 1 tablet (75 mg total) by mouth daily. Start taking on: November 20, 2020   docusate sodium 100 MG capsule Commonly known as: COLACE Take 1 capsule (100 mg total) by mouth 2 (two) times daily.   ezetimibe 10 MG tablet Commonly known as: ZETIA Take 1 tablet (10 mg total) by mouth daily at 6 PM.   loratadine 10 MG tablet Commonly known as: CLARITIN Take 10 mg by mouth daily.   multivitamin tablet Take 1 tablet by mouth daily.   polyethylene glycol powder 17 GM/SCOOP powder Commonly known as: GLYCOLAX/MIRALAX Take 17 g by mouth daily.   simvastatin 20 MG tablet Commonly known as: ZOCOR Take 1 tablet (20 mg total) by mouth daily at 6 PM.   traZODone 50 MG tablet Commonly known as: DESYREL Take 1 tablet (50 mg total) by mouth at bedtime.   VITAMIN D-3 PO Take 1 tablet by mouth daily.   Vitamin E 400 units Tabs Take 400 Units by mouth daily.        Allergies  Allergen Reactions   Atorvastatin Other (See Comments)    Muscle aches   Cellcept [Mycophenolate Mofetil] Other (See Comments)    unknown   Medrol [Methylprednisolone] Other (See Comments)    unknown   Mycophenolate Mofetil Other (See Comments)    unknown   Penicillins Itching   Sulfonamide Derivatives Nausea Only   Topiramate Other (See Comments)    unknown   Verapamil Other (See Comments)    Felt shakey   Levofloxacin Rash     Procedures /Studies:   CT ANGIO HEAD W OR WO CONTRAST  Result Date: 11/14/2020 CLINICAL DATA:  Neuro deficit, acute, stroke suspected. EXAM: CT ANGIOGRAPHY HEAD AND NECK TECHNIQUE: Multidetector CT imaging of the head and neck was performed using the standard protocol during bolus administration of intravenous contrast. Multiplanar  CT image reconstructions and MIPs were obtained to evaluate the vascular anatomy. Carotid stenosis measurements (when applicable) are obtained utilizing NASCET criteria, using the distal internal carotid diameter as the denominator. CONTRAST:  138m OMNIPAQUE IOHEXOL 350 MG/ML SOLN COMPARISON:  MRI/MRA head 11/13/2020. Head CT 11/12/2020. FINDINGS: CT HEAD FINDINGS Brain: Mild generalized cerebral atrophy. Known small acute infarcts within the right frontal white matter and left frontal lobe cortex were better appreciated on the brain MRI of 11/13/2020. Background moderate patchy and ill-defined hypoattenuation within the cerebral white matter, nonspecific but compatible chronic small vessel ischemic disease. Redemonstrated small chronic cortical infarct within the anterior left frontal lobe. There is no acute intracranial hemorrhage. No extra-axial fluid collection. No evidence of an intracranial mass. No midline shift. Vascular: No hyperdense vessel.  Atherosclerotic calcifications. Skull: Normal. Negative for fracture or focal lesion. Sinuses: No significant paranasal sinus disease. Orbits: No mass or acute finding. Other: Small right mastoid effusion. Review of the MIP images  confirms the above findings CTA NECK FINDINGS Aortic arch: Standard aortic branching. The innominate artery origin is excluded from the field of view. Atherosclerotic plaque within the visualized aortic arch and proximal major branch vessels of the neck. Soft and calcified plaque within the proximal right subclavian artery results in 65% stenosis. No significant hemodynamically significant stenosis within the visualized innominate artery or proximal left subclavian artery. Right carotid system: CCA and ICA patent within the neck. Soft and calcified plaque within the proximal ICA with resultant 40% stenosis. Left carotid system: CCA and ICA patent within the neck without stenosis. Minimal soft plaque within the CCA. Mild calcified plaque at  the carotid bifurcation. Vertebral arteries: Codominant and patent within the neck without stenosis. Skeleton: Cervical spondylosis with suspected degenerative fusion across the C4-C5 and C5-C6 disc spaces and multilevel facet joint ankylosis. No acute bony abnormality or aggressive osseous lesion. Other neck: No cervical lymphadenopathy. Multiple thyroid nodules, the largest within the right lobe measuring 2.0 cm. Fatty atrophy of the bilateral parotid glands. Upper chest: No consolidation within the imaged lung apices. Review of the MIP images confirms the above findings CTA HEAD FINDINGS Anterior circulation: The intracranial internal carotid arteries are patent. Calcified plaque within both vessels without stenosis. The M1 middle cerebral arteries are patent. Atherosclerotic irregularity of the M2 and more distal middle cerebral arteries bilaterally. No M2 proximal branch occlusion or high-grade proximal stenosis is identified. The anterior cerebral arteries are patent. 1-2 mm inferiorly projecting vascular protrusion arising from the supraclinoid right ICA, which may reflect an aneurysm or infundibulum (series 12, image 95). Posterior circulation: The intracranial vertebral arteries are patent. The basilar artery is patent. Mild atherosclerotic irregularity of these vessels without stenosis. The posterior cerebral arteries are patent. Mild atherosclerotic irregularity of both vessels without significant proximal stenosis. Posterior communicating arteries are hypoplastic or absent bilaterally. Venous sinuses: Within the limitations of contrast timing, no convincing thrombus. Anatomic variants: As described Review of the MIP images confirms the above findings IMPRESSION: CT head: 1. Known small acute infarcts within the right frontal lobe white matter and left frontal lobe cortex were better appreciated on the brain MRI of 11/13/2020. 2. Redemonstrated small chronic cortical infarct within the anterior left  frontal lobe. 3. Background moderate cerebral white matter chronic small vessel ischemic disease. 4. Mild generalized cerebral atrophy. 5. Small right mastoid effusion. CTA neck: 1. Atherosclerotic plaque within the proximal right subclavian artery results in 65% stenosis. 2. The bilateral common carotid and internal carotid arteries are patent within the neck. Atherosclerotic plaque within the proximal right ICA results in 40% stenosis. Minimal atherosclerotic plaque within the left carotid system within the neck. 3. Vertebral arteries codominant and patent within the neck without stenosis. 4. Multiple thyroid nodules, the largest within the right lobe measuring 2 cm. A dedicated thyroid ultrasound may be obtained for further evaluation as clinically warranted (given the patient's advanced age). CTA head: 1. Atherosclerotic disease. No intracranial large vessel occlusion or proximal high-grade stenosis. 2. 1-2 mm inferiorly projecting vascular protrusion arising from the supraclinoid right ICA, which may reflect an aneurysm or infundibulum. Electronically Signed   By: Kellie Simmering DO   On: 11/14/2020 13:43   CT Head Wo Contrast  Result Date: 11/12/2020 CLINICAL DATA:  Delirium.  Confusion. EXAM: CT HEAD WITHOUT CONTRAST TECHNIQUE: Contiguous axial images were obtained from the base of the skull through the vertex without intravenous contrast. COMPARISON:  Head CT 10/15/2015 FINDINGS: Brain: Stable degree of atrophy there is moderate chronic small  vessel ischemia with progression from 2017. no intracranial hemorrhage, mass effect, or midline shift. No hydrocephalus. The basilar cisterns are patent. No evidence of territorial infarct or acute ischemia. No extra-axial or intracranial fluid collection. Vascular: Atherosclerosis of skullbase vasculature without hyperdense vessel or abnormal calcification. Skull: No fracture or focal lesion. Sinuses/Orbits: Minor mucosal thickening of ethmoid air cells. No sinus  fluid levels. Hypoplastic right mastoid air cells. No significant mastoid effusion. Unremarkable orbits. Other: None. IMPRESSION: 1. No acute intracranial abnormality. 2. Generalized atrophy and chronic small vessel ischemia. Electronically Signed   By: Keith Rake M.D.   On: 11/12/2020 18:06   CT ANGIO NECK W OR WO CONTRAST  Result Date: 11/14/2020 CLINICAL DATA:  Neuro deficit, acute, stroke suspected. EXAM: CT ANGIOGRAPHY HEAD AND NECK TECHNIQUE: Multidetector CT imaging of the head and neck was performed using the standard protocol during bolus administration of intravenous contrast. Multiplanar CT image reconstructions and MIPs were obtained to evaluate the vascular anatomy. Carotid stenosis measurements (when applicable) are obtained utilizing NASCET criteria, using the distal internal carotid diameter as the denominator. CONTRAST:  160m OMNIPAQUE IOHEXOL 350 MG/ML SOLN COMPARISON:  MRI/MRA head 11/13/2020. Head CT 11/12/2020. FINDINGS: CT HEAD FINDINGS Brain: Mild generalized cerebral atrophy. Known small acute infarcts within the right frontal white matter and left frontal lobe cortex were better appreciated on the brain MRI of 11/13/2020. Background moderate patchy and ill-defined hypoattenuation within the cerebral white matter, nonspecific but compatible chronic small vessel ischemic disease. Redemonstrated small chronic cortical infarct within the anterior left frontal lobe. There is no acute intracranial hemorrhage. No extra-axial fluid collection. No evidence of an intracranial mass. No midline shift. Vascular: No hyperdense vessel.  Atherosclerotic calcifications. Skull: Normal. Negative for fracture or focal lesion. Sinuses: No significant paranasal sinus disease. Orbits: No mass or acute finding. Other: Small right mastoid effusion. Review of the MIP images confirms the above findings CTA NECK FINDINGS Aortic arch: Standard aortic branching. The innominate artery origin is excluded from  the field of view. Atherosclerotic plaque within the visualized aortic arch and proximal major branch vessels of the neck. Soft and calcified plaque within the proximal right subclavian artery results in 65% stenosis. No significant hemodynamically significant stenosis within the visualized innominate artery or proximal left subclavian artery. Right carotid system: CCA and ICA patent within the neck. Soft and calcified plaque within the proximal ICA with resultant 40% stenosis. Left carotid system: CCA and ICA patent within the neck without stenosis. Minimal soft plaque within the CCA. Mild calcified plaque at the carotid bifurcation. Vertebral arteries: Codominant and patent within the neck without stenosis. Skeleton: Cervical spondylosis with suspected degenerative fusion across the C4-C5 and C5-C6 disc spaces and multilevel facet joint ankylosis. No acute bony abnormality or aggressive osseous lesion. Other neck: No cervical lymphadenopathy. Multiple thyroid nodules, the largest within the right lobe measuring 2.0 cm. Fatty atrophy of the bilateral parotid glands. Upper chest: No consolidation within the imaged lung apices. Review of the MIP images confirms the above findings CTA HEAD FINDINGS Anterior circulation: The intracranial internal carotid arteries are patent. Calcified plaque within both vessels without stenosis. The M1 middle cerebral arteries are patent. Atherosclerotic irregularity of the M2 and more distal middle cerebral arteries bilaterally. No M2 proximal branch occlusion or high-grade proximal stenosis is identified. The anterior cerebral arteries are patent. 1-2 mm inferiorly projecting vascular protrusion arising from the supraclinoid right ICA, which may reflect an aneurysm or infundibulum (series 12, image 95). Posterior circulation: The intracranial vertebral arteries are  patent. The basilar artery is patent. Mild atherosclerotic irregularity of these vessels without stenosis. The posterior  cerebral arteries are patent. Mild atherosclerotic irregularity of both vessels without significant proximal stenosis. Posterior communicating arteries are hypoplastic or absent bilaterally. Venous sinuses: Within the limitations of contrast timing, no convincing thrombus. Anatomic variants: As described Review of the MIP images confirms the above findings IMPRESSION: CT head: 1. Known small acute infarcts within the right frontal lobe white matter and left frontal lobe cortex were better appreciated on the brain MRI of 11/13/2020. 2. Redemonstrated small chronic cortical infarct within the anterior left frontal lobe. 3. Background moderate cerebral white matter chronic small vessel ischemic disease. 4. Mild generalized cerebral atrophy. 5. Small right mastoid effusion. CTA neck: 1. Atherosclerotic plaque within the proximal right subclavian artery results in 65% stenosis. 2. The bilateral common carotid and internal carotid arteries are patent within the neck. Atherosclerotic plaque within the proximal right ICA results in 40% stenosis. Minimal atherosclerotic plaque within the left carotid system within the neck. 3. Vertebral arteries codominant and patent within the neck without stenosis. 4. Multiple thyroid nodules, the largest within the right lobe measuring 2 cm. A dedicated thyroid ultrasound may be obtained for further evaluation as clinically warranted (given the patient's advanced age). CTA head: 1. Atherosclerotic disease. No intracranial large vessel occlusion or proximal high-grade stenosis. 2. 1-2 mm inferiorly projecting vascular protrusion arising from the supraclinoid right ICA, which may reflect an aneurysm or infundibulum. Electronically Signed   By: Kellie Simmering DO   On: 11/14/2020 13:43   MR ANGIO HEAD WO CONTRAST  Result Date: 11/13/2020 CLINICAL DATA:  Mental status change.  Unknown cause. EXAM: MRI HEAD WITHOUT CONTRAST MRA HEAD WITHOUT CONTRAST TECHNIQUE: Multiplanar, multi-echo pulse  sequences of the brain and surrounding structures were acquired without intravenous contrast. Angiographic images of the Circle of Willis were acquired using MRA technique without intravenous contrast. COMPARISON:  MRI/MRI October 24, 2015. CT head from yesterday. FINDINGS: MRI HEAD FINDINGS Brain: Acute infarct in the right frontal white matter (series 5, image 23). Additional small mild area of restricted diffusion in the left frontal cortex (series 5, image 21). No significant edema or mass effect. No acute hemorrhage. Additional faint focus of DWI hyperintensity in the more anterior left frontal cortex (series 5, image 24) is associated with an area of susceptibility artifact and favored to represent artifact from mineralization when correlating with prior CT head. No evidence of acute hemorrhage, hydrocephalus, mass lesion, midline shift, extra-axial fluid collection. On susceptibility weighted imaging there is subtle sulcal susceptibility artifact along the bifrontal convexities, suggestive of superficial siderosis. Moderate scattered T2/FLAIR hyperintensities within the white matter, nonspecific but most likely related to chronic microvascular ischemic disease. Mild for age atrophy. Vascular: See below. Skull and upper cervical spine: Normal marrow signal. Sinuses/Orbits: Mild ethmoid air cell mucosal thickening. Unremarkable orbits. Other: Small bilateral mastoid effusions. MRA HEAD FINDINGS Anterior circulation: Bilateral ICAs, MCAs, in the ACAs are patent. Similar mild moderate stenosis of the left MCA origin. Suspected at least moderate stenosis of the right M1 MCA, which may be accentuated by motion and downward flow on this noncontrast MRA. Posterior circulation: Bilateral vertebral arteries, basilar artery, and posterior cerebral arteries are patent. Suspected at least moderate right vertebral artery stenosis. Bilateral PCAs are patent with likely mild right PCA stenosis. IMPRESSION: MRI: 1. Acute infarct  in the right frontal white matter. Possible additional small acute infarct in the left frontal cortex. No significant edema or mass effect. Given  potential involvement of multiple vascular territories, consider a central embolic etiology. 2. 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 3. Moderate chronic microvascular disease and mild atrophy. MRA: 1. Motion limited evaluation with suspected at least moderate stenosis of the right M1 MCA and right intradural vertebral artery. 2. Mild-to-moderate left M1 MCA stenosis. Electronically Signed   By: Margaretha Sheffield MD   On: 11/13/2020 12:09   MR BRAIN WO CONTRAST  Result Date: 11/13/2020 CLINICAL DATA:  Mental status change.  Unknown cause. EXAM: MRI HEAD WITHOUT CONTRAST MRA HEAD WITHOUT CONTRAST TECHNIQUE: Multiplanar, multi-echo pulse sequences of the brain and surrounding structures were acquired without intravenous contrast. Angiographic images of the Circle of Willis were acquired using MRA technique without intravenous contrast. COMPARISON:  MRI/MRI October 24, 2015. CT head from yesterday. FINDINGS: MRI HEAD FINDINGS Brain: Acute infarct in the right frontal white matter (series 5, image 23). Additional small mild area of restricted diffusion in the left frontal cortex (series 5, image 21). No significant edema or mass effect. No acute hemorrhage. Additional faint focus of DWI hyperintensity in the more anterior left frontal cortex (series 5, image 24) is associated with an area of susceptibility artifact and favored to represent artifact from mineralization when correlating with prior CT head. No evidence of acute hemorrhage, hydrocephalus, mass lesion, midline shift, extra-axial fluid collection. On susceptibility weighted imaging there is subtle sulcal susceptibility artifact along the bifrontal convexities, suggestive of  superficial siderosis. Moderate scattered T2/FLAIR hyperintensities within the white matter, nonspecific but most likely related to chronic microvascular ischemic disease. Mild for age atrophy. Vascular: See below. Skull and upper cervical spine: Normal marrow signal. Sinuses/Orbits: Mild ethmoid air cell mucosal thickening. Unremarkable orbits. Other: Small bilateral mastoid effusions. MRA HEAD FINDINGS Anterior circulation: Bilateral ICAs, MCAs, in the ACAs are patent. Similar mild moderate stenosis of the left MCA origin. Suspected at least moderate stenosis of the right M1 MCA, which may be accentuated by motion and downward flow on this noncontrast MRA. Posterior circulation: Bilateral vertebral arteries, basilar artery, and posterior cerebral arteries are patent. Suspected at least moderate right vertebral artery stenosis. Bilateral PCAs are patent with likely mild right PCA stenosis. IMPRESSION: MRI: 1. 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 Possible additional small acute infarct in the left frontal cortex. No significant edema or mass effect. Given potential involvement of multiple vascular territories, consider a central embolic etiology. 2. 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 3. Moderate chronic microvascular disease and mild atrophy. MRA: 1. Motion limited evaluation with suspected at least moderate stenosis of the right M1 MCA and right intradural vertebral artery. 2. Mild-to-moderate left M1 MCA stenosis. Electronically Signed   By: Margaretha Sheffield MD   On: 11/13/2020 12:09   DG Chest Portable 1 View  Result Date: 11/12/2020 CLINICAL DATA:  Altered mental status EXAM: PORTABLE CHEST 1 VIEW COMPARISON:  12/14/2019 FINDINGS: The heart size and mediastinal contours are within normal limits. Both lungs are clear. Metallic device overlies the left  perihilar region, likely external to the patient. The visualized skeletal structures are unremarkable. IMPRESSION: No active disease. Electronically Signed   By: Fidela Salisbury MD   On: 11/12/2020 18:28   ECHOCARDIOGRAM COMPLETE  Result Date: 11/13/2020    ECHOCARDIOGRAM REPORT   Patient Name:   KIARALIZ TRATHEN  Casares Date of Exam: 11/13/2020 Medical Rec #:  [id number]        Height:       60.0 in Accession #:    [id number]       Weight:       109.0 lb Date of Birth:  [birth year]        BSA:          1.442 m Patient Age:    87 years         BP:           84/42 mmHg Patient Gender: F                HR:           67 bpm. Exam Location:  [facility] Procedure: 2D Echo, Cardiac Doppler and Color Doppler Indications:    Abnormal EKG  History:        Patient has prior history of Echocardiogram examinations, most                 recent 10/16/2015. Arrythmias:Abnormal EKG, Signs/Symptoms:Very                 altered mental status; Risk Factors:Hypertension and                 Dyslipidemia.  Sonographer:    Dustin Flock RDCS Referring Phys: [place]  1. Left ventricular ejection fraction, by estimation, is 60 to 65%. The left ventricle has normal function. The left ventricle has no regional wall motion abnormalities. There is mild left ventricular hypertrophy. Left ventricular diastolic parameters are indeterminate.  2. Right ventricular systolic function is normal. The right ventricular size is normal. There is normal pulmonary artery systolic pressure. The estimated right ventricular systolic pressure is 123456 mmHg.  3. There is a trivial pericardial effusion that is circumferential.  4. The mitral valve is grossly normal. No evidence of mitral valve regurgitation.  5. The aortic valve is tricuspid. There is mild calcification of the aortic valve. Aortic valve regurgitation is trivial.  6. The inferior vena cava is normal in size with greater than 50% respiratory variability, suggesting right atrial  pressure of 3 mmHg. FINDINGS  Left Ventricle: Left ventricular ejection fraction, by estimation, is 60 to 65%. The left ventricle has normal function. The left ventricle has no regional wall motion abnormalities. The left ventricular internal cavity size was normal in size. There is  mild left ventricular hypertrophy. Abnormal (paradoxical) septal motion, consistent with left bundle branch block. Left ventricular diastolic parameters are indeterminate. Right Ventricle: The right ventricular size is normal. No increase in right ventricular wall thickness. Right ventricular systolic function is normal. There is normal pulmonary artery systolic pressure. The tricuspid regurgitant velocity is 2.13 m/s, and  with an assumed right atrial pressure of 3 mmHg, the estimated right ventricular systolic pressure is 123456 mmHg. Left Atrium: Left atrial size was normal in size. Right Atrium: Right atrial size was normal in size. Pericardium: Trivial pericardial effusion is present. The pericardial effusion is circumferential. Mitral Valve: The mitral valve is grossly normal. No evidence of mitral valve regurgitation. Tricuspid Valve: The tricuspid valve is grossly normal. Tricuspid valve regurgitation is trivial. Aortic Valve: The aortic valve is tricuspid. There is mild calcification of the aortic valve. Aortic valve regurgitation is trivial. Aortic regurgitation PHT measures 359 msec. Pulmonic Valve: The pulmonic valve was grossly normal. Pulmonic valve regurgitation is trivial. Aorta: The aortic root is normal in size and structure.  Venous: The inferior vena cava is normal in size with greater than 50% respiratory variability, suggesting right atrial pressure of 3 mmHg. IAS/Shunts: No atrial level shunt detected by color flow Doppler.  LEFT VENTRICLE PLAX 2D LVIDd:         3.43 cm  Diastology LVIDs:         2.29 cm  LV e' medial:    6.83 cm/s LV PW:         1.25 cm  LV E/e' medial:  12.1 LV IVS:        1.25 cm  LV e' lateral:    8.27 cm/s LVOT diam:     1.90 cm  LV E/e' lateral: 10.0 LV SV:         54 LV SV Index:   37 LVOT Area:     2.84 cm  RIGHT VENTRICLE RV S prime:     14.90 cm/s RVOT diam:      2.60 cm TAPSE (M-mode): 1.5 cm LEFT ATRIUM             Index       RIGHT ATRIUM           Index LA diam:        2.70 cm 1.87 cm/m  RA Area:     10.70 cm LA Vol (A2C):   27.1 ml 18.79 ml/m RA Volume:   22.50 ml  15.60 ml/m LA Vol (A4C):   31.0 ml 21.50 ml/m LA Biplane Vol: 31.4 ml 21.77 ml/m  AORTIC VALVE LVOT Vmax:   105.00 cm/s LVOT Vmean:  69.100 cm/s LVOT VTI:    0.189 m AI PHT:      359 msec  AORTA Ao Root diam: 2.70 cm MITRAL VALVE                TRICUSPID VALVE MV Area (PHT): 5.88 cm     TR Peak grad:   18.1 mmHg MV Decel Time: 129 msec     TR Vmax:        213.00 cm/s MV E velocity: 82.40 cm/s MV A velocity: 120.00 cm/s  SHUNTS MV E/A ratio:  0.69         Systemic VTI:  0.19 m                             Systemic Diam: 1.90 cm                             Pulmonic Diam: 2.60 cm Rozann Lesches MD Electronically signed by Rozann Lesches MD Signature Date/Time: 11/13/2020/12:21:12 PM    Final    US THYROID  Result Date: 11/14/2020 CLINICAL DATA:  Incidental on CT. EXAM: THYROID ULTRASOUND TECHNIQUE: Ultrasound examination of the thyroid gland and adjacent soft tissues was performed. COMPARISON:  CT scan performed earlier today FINDINGS: Parenchymal Echotexture: Mildly heterogenous Isthmus: 0.2 cm Right lobe: 5.4 x 2.3 x 1.3 cm Left lobe: 4.8 x 1.8 x 1.2 cm _________________________________________________________ Estimated total number of nodules >/= 1 cm: 3 Number of spongiform nodules >/=  2 cm not described below (TR1): 0 Number of mixed cystic and solid nodules >/= 1.5 cm not described below (TR2): 0 _________________________________________________________ Nodule # 1: 1.5 x 1.0 x 1.1 cm spongiform nodule in the right upper gland. This is a benign morphology and no further follow-up is required.  _________________________________________________________ Nodule # 2: Location: Right; Mid  Maximum size: 1.4 cm; Other 2 dimensions: 0.8 x 1.4 cm Composition: solid/almost completely solid (2) Echogenicity: hypoechoic (2) Shape: not taller-than-wide (0) Margins: smooth (0) Echogenic foci: none (0) ACR TI-RADS total points: 4. ACR TI-RADS risk category: TR4 (4-6 points). ACR TI-RADS recommendations: *Given size (>/= 1 - 1.4 cm) and appearance, a follow-up ultrasound in 1 year should be considered based on TI-RADS criteria. _________________________________________________________ Nodule # 4: Small isoechoic predominantly solid nodule in the left superior gland measures up to 1.3 cm. This nodule does not meet criteria for biopsy or dedicated imaging surveillance. _________________________________________________________ Multiple additional small subcentimeter cysts and nodules present scattered throughout the gland. None meet criteria to warrant further evaluation. IMPRESSION: 1. Heterogeneous, enlarged and multinodular thyroid gland most consistent with multinodular goiter. 2. A 1.4 cm TI-RADS category 4 nodule in the right mid gland meets criteria for imaging surveillance. Recommend follow-up ultrasound in 1 year. 3. Additional bilateral thyroid nodules also noted incidentally but do not meet criteria to recommend further evaluation. The above is in keeping with the ACR TI-RADS recommendations - J Am Coll Radiol 2017;14:587-595. Electronically Signed   By: Jacqulynn Cadet M.D.   On: 11/14/2020 15:46    Subjective:   Patient was seen and examined 11/19/2020, 12:39 PM Patient stable today. No acute distress.  No issues overnight Stable for discharge.  Discharge Exam:    Vitals:   11/18/20 1444 11/18/20 2121 11/19/20 0008 11/19/20 0417  BP: (!) 128/58 (!) 147/72 (!) 136/50 (!) 139/59  Pulse: 77 87 74 76  Resp: '18 18 18 16  '$ Temp: 98.2 F (36.8 C) 98.5 F (36.9 C) 98.7 F (37.1 C) 99.2 F (37.3 C)   TempSrc: Oral Oral Oral Oral  SpO2: 100% 100% 97% 97%  Weight:      Height:        General: Pt lying comfortably in bed & appears in no obvious distress. Cardiovascular: S1 & S2 heard, RRR, S1/S2 +. No murmurs, rubs, gallops or clicks. No JVD or pedal edema. Respiratory: Clear to auscultation without wheezing, rhonchi or crackles. No increased work of breathing. Abdominal:  Non-distended, non-tender & soft. No organomegaly or masses appreciated. Normal bowel sounds heard. CNS: Alert and oriented. No focal deficits. Extremities: no edema, no cyanosis      The results of significant diagnostics from this hospitalization (including imaging, microbiology, ancillary and laboratory) are listed below for reference.      Microbiology:   Recent Results (from the past 240 hour(s))  Resp Panel by RT-PCR (Flu A&B, Covid) Nasopharyngeal Swab     Status: None   Collection Time: 11/12/20  7:27 PM   Specimen: Nasopharyngeal Swab; Nasopharyngeal(NP) swabs in vial transport medium  Result Value Ref Range Status   SARS Coronavirus 2 by RT PCR NEGATIVE NEGATIVE Final    Comment: (NOTE) SARS-CoV-2 target nucleic acids are NOT DETECTED.  The SARS-CoV-2 RNA is generally detectable in upper respiratory specimens during the acute phase of infection. The lowest concentration of SARS-CoV-2 viral copies this assay can detect is 138 copies/mL. A negative result does not preclude SARS-Cov-2 infection and should not be used as the sole basis for treatment or other patient management decisions. A negative result may occur with  improper specimen collection/handling, submission of specimen other than nasopharyngeal swab, presence of viral mutation(s) within the areas targeted by this assay, and inadequate number of viral copies(<138 copies/mL). A negative result must be combined with clinical observations, patient history, and epidemiological information. The expected result is Negative.  Fact  Sheet for  Patients:  EntrepreneurPulse.com.au  Fact Sheet for Healthcare Providers:  IncredibleEmployment.be  This test is no t yet approved or cleared by the [place] FDA and  has been authorized for detection and/or diagnosis of SARS-CoV-2 by FDA under an Emergency Use Authorization (EUA). This EUA will remain  in effect (meaning this test can be used) for the duration of the COVID-19 declaration under Section 564(b)(1) of the Act, 21 U.S.C.section 360bbb-3(b)(1), unless the authorization is terminated  or revoked sooner.       Influenza A by PCR NEGATIVE NEGATIVE Final   Influenza B by PCR NEGATIVE NEGATIVE Final    Comment: (NOTE) The Xpert Xpress SARS-CoV-2/FLU/RSV plus assay is intended as an aid in the diagnosis of influenza from Nasopharyngeal swab specimens and should not be used as a sole basis for treatment. Nasal washings and aspirates are unacceptable for Xpert Xpress SARS-CoV-2/FLU/RSV testing.  Fact Sheet for Patients: EntrepreneurPulse.com.au  Fact Sheet for Healthcare Providers: IncredibleEmployment.be  This test is not yet approved or cleared by the [place] FDA and has been authorized for detection and/or diagnosis of SARS-CoV-2 by FDA under an Emergency Use Authorization (EUA). This EUA will remain in effect (meaning this test can be used) for the duration of the COVID-19 declaration under Section 564(b)(1) of the Act, 21 U.S.C. section 360bbb-3(b)(1), unless the authorization is terminated or revoked.  Performed at [facility], [address] [place], [place] [address]   SARS CORONAVIRUS 2 (TAT 6-24 HRS) Nasopharyngeal Nasopharyngeal Swab     Status: None   Collection Time: 11/17/20 12:05 PM   Specimen: Nasopharyngeal Swab  Result Value Ref Range Status   SARS Coronavirus 2 NEGATIVE NEGATIVE Final    Comment: (NOTE) SARS-CoV-2 target nucleic acids are NOT DETECTED.  The  SARS-CoV-2 RNA is generally detectable in upper and lower respiratory specimens during the acute phase of infection. Negative results do not preclude SARS-CoV-2 infection, do not rule out co-infections with other pathogens, and should not be used as the sole basis for treatment or other patient management decisions. Negative results must be combined with clinical observations, patient history, and epidemiological information. The expected result is Negative.  Fact Sheet for Patients: SugarRoll.be  Fact Sheet for Healthcare Providers: https://www.woods-mathews.com/  This test is not yet approved or cleared by the [place] FDA and  has been authorized for detection and/or diagnosis of SARS-CoV-2 by FDA under an Emergency Use Authorization (EUA). This EUA will remain  in effect (meaning this test can be used) for the duration of the COVID-19 declaration under Se ction 564(b)(1) of the Act, 21 U.S.C. section 360bbb-3(b)(1), unless the authorization is terminated or revoked sooner.  Performed at [facility], [place] [address] [place], [place] [address]      Labs:   CBC: Recent Labs  Lab 11/12/20 1615 11/13/20 0411  WBC 4.0 6.8  NEUTROABS 2.4  --   HGB 14.0 13.0  HCT 41.8 38.5  MCV 97.7 97.0  PLT 229 [id number]   Basic Metabolic Panel: Recent Labs  Lab 11/12/20 1615 11/13/20 0411 11/14/20 0520 11/19/20 0626  NA 137 137 138  --   K 3.8 3.3* 5.0  --   CL 102 102 104  --   CO2 '27 27 27  '$ --   GLUCOSE 92 119* 93  --   BUN '13 15 13  '$ --   CREATININE 0.69 0.79 0.75 0.67  CALCIUM 11.3* 10.6* 10.5*  --   MG  --  2.2  --   --  PHOS  --  2.1* 4.2  --    Liver Function Tests: Recent Labs  Lab 11/12/20 1615 11/13/20 0411 11/14/20 0520  AST 26 23  --   ALT 21 17  --   ALKPHOS 66 54  --   BILITOT 0.7 0.9  --   PROT 7.6 6.5  --   ALBUMIN 4.4 3.7 3.6   BNP (last 3 results) No results for input(s): BNP in the last 8760  hours. Cardiac Enzymes: No results for input(s): CKTOTAL, CKMB, CKMBINDEX, TROPONINI in the last 168 hours. CBG: Recent Labs  Lab 11/17/20 1146 11/17/20 1310 11/17/20 1605 11/19/20 0418 11/19/20 1120  GLUCAP 71 130* 104* 92 94   Hgb A1c No results for input(s): HGBA1C in the last 72 hours. Lipid Profile No results for input(s): CHOL, HDL, LDLCALC, TRIG, CHOLHDL, LDLDIRECT in the last 72 hours. Thyroid function studies No results for input(s): TSH, T4TOTAL, T3FREE, THYROIDAB in the last 72 hours.  Invalid input(s): FREET3 Anemia work up No results for input(s): VITAMINB12, FOLATE, FERRITIN, TIBC, IRON, RETICCTPCT in the last 72 hours. Urinalysis    Component Value Date/Time   COLORURINE STRAW (A) 11/12/2020 1602   APPEARANCEUR CLEAR 11/12/2020 1602   LABSPEC 1.008 11/12/2020 1602   PHURINE 7.0 11/12/2020 1602   GLUCOSEU NEGATIVE 11/12/2020 1602   HGBUR NEGATIVE 11/12/2020 1602   BILIRUBINUR NEGATIVE 11/12/2020 1602   KETONESUR 5 (A) 11/12/2020 1602   PROTEINUR NEGATIVE 11/12/2020 1602   NITRITE NEGATIVE 11/12/2020 1602   LEUKOCYTESUR NEGATIVE 11/12/2020 1602         Time coordinating discharge: Over 45 minutes  SIGNED: Deatra James, MD, FACP, FHM. [facility],  Please use [email or web address] to Page If 7PM-7AM, please contact night-coverage [email or web address] 11/19/2020, 12:39 PM

## 2020-11-19 NOTE — Discharge Summary (Addendum)
Physician Discharge Summary [facility]    Patient: Peggy Ramirez                   Admit date: 11/12/2020   DOB: [birth year]             Discharge date:11/19/2020/9:42 AM [id number]                          PCP: Peggy Hilding, MD  Disposition: SNF -[facility]eggy [facility]  Recommendations for Outpatient Follow-up:   Follow up: With PCP in 2-3 weeks Follow with neurologist 1-2 weeks Continue currently recommended medication dual antiplatelet treatment, high-dose statins  Discharge Condition: Stable   Code Status:   Code Status: Full Code  Diet recommendation: Cardiac diet    The patient was seen and examined this morning, stable no acute distress.  Cleared for discharge to SNF. The prior discussed in detail the patient she seems to understand and agree with the plan.   Discharge Diagnoses:    Principal Problem:   Acute CVA (cerebrovascular accident) [facility]) Active Problems:   Essential hypertension   Chronic constipation   Altered mental status   GERD (gastroesophageal reflux disease)   Acute metabolic encephalopathy   History of Present Illness/ Hospital Course Peggy Ramirez Summary:   Brief Summary:- 87 y.o. female with medical history significant for Sjogren's, lupus, hypertension, hyperlipidemia, GERD, recurrent UTI and chronic constipation admitted on 11/12/2020 with acute strokes resulting in acute metabolic encephalopathy/psychosis  A/p 1)Acute CVA--- MRI brain and MRA Head on 11/13/2020 showed acute infarct in the right frontal white matter.  -Remained stable, no new focal neurological findings - Possible additional small acute infarct in the left frontal cortex. No significant edema or mass effect. Given potential involvement of multiple vascular territories, likely embolic etiology Neurologist recommended 30-day event monitor-cardiology was notified to assist  --TTE with preserved EF of 60 to 65%,  Awaiting official consult from Peggy Ramirez the neurologist,  recommended no TEE   -PT, OT and speech pathology consult requested -Get CTA neck -pending as patient refused to get the study done yesterday -Continue Aspirin, Plavix  - for 1 month then monotherapy recommended -To continue high-dose statin  -TSH 1.1  -Status post PT/OT evaluation recommending CIR versus SNF.... Pending SNF approval  2)acute metabolic encephalopathy/psychosis- --Resolved back to baseline  due to #1 above, manage as above -Serum ammonia WNL, folate, B12 and TSH WNL -We will consider EEG per neurology recommendation --Confusion agitation at evening, night consistent with possibly sundowning -Neurology stating associated hallucination raises the possibility of Lewy body dementia As needed Xanax   3)H/o HTN-- hypotensive episode-- -BP stabilized -cortisol is not low, maintain adequate hydration   4)Hypokalemia/hypophosphatemia --- replace Kcl, replace phosphorous, magnesium is WNL  5)GERD--continue Protonix 40 mg daily  6) Thyroid mass -Normal TSH, free T4 Thyroid ultrasound: IMPRESSION: 1. Heterogeneous, enlarged and multinodular thyroid gland most consistent with multinodular goiter. 2. A 1.4 cm TI-RADS category 4 nodule in the right mid gland meets criteria for imaging surveillance. Recommend follow-up ultrasound in 1 year. 3. Additional bilateral thyroid nodules also noted incidentally but do not meet criteria to recommend further evaluation.  Disposition: The patient is from: Home              Anticipated d/c is to: SNF                     Barriers: Clinically Stable-   Code Status :  -  Code Status: Full Code   Family Communication:    Discussed with son Peggy Ramirez at bedside on 11/16/2020  Consults  :  Neurology       Discharge Instructions:   Discharge Instructions     Activity as tolerated - No restrictions   Complete by: As directed    Call MD for:  difficulty breathing, headache or visual disturbances   Complete by: As directed    Call  MD for:  extreme fatigue   Complete by: As directed    Call MD for:  hives   Complete by: As directed    Call MD for:  persistant dizziness or light-headedness   Complete by: As directed    Call MD for:  persistant nausea and vomiting   Complete by: As directed    Call MD for:  redness, tenderness, or signs of infection (pain, swelling, redness, odor or green/yellow discharge around incision site)   Complete by: As directed    Call MD for:  severe uncontrolled pain   Complete by: As directed    Call MD for:  temperature >100.4   Complete by: As directed    Diet - low sodium heart healthy   Complete by: As directed    Discharge instructions   Complete by: As directed    Follow-up with PCP in 1 week, follow-up with neurology in 2 weeks   Increase activity slowly   Complete by: As directed        Allergies  Allergen Reactions   Atorvastatin Other (See Comments)    Muscle aches   Cellcept [Mycophenolate Mofetil] Other (See Comments)    unknown   Medrol [Methylprednisolone] Other (See Comments)    unknown   Mycophenolate Mofetil Other (See Comments)    unknown   Penicillins Itching   Sulfonamide Derivatives Nausea Only   Topiramate Other (See Comments)    unknown   Verapamil Other (See Comments)    Felt shakey   Levofloxacin Rash   Scheduled Meds:  aspirin EC  81 mg Oral Q breakfast   clopidogrel  75 mg Oral Daily   docusate sodium  100 mg Oral BID   enoxaparin (LOVENOX) injection  40 mg Subcutaneous Q24H   ezetimibe  10 mg Oral q1800   mineral oil  1 enema Rectal Once   senna-docusate  1 tablet Oral BID   simvastatin  20 mg Oral q1800   traZODone  50 mg Oral QHS   Continuous Infusions: PRN Meds:.acetaminophen, ALPRAZolam, pantoprazole     Procedures /Studies:   CT ANGIO HEAD W OR WO CONTRAST  Result Date: 11/14/2020 CLINICAL DATA:  Neuro deficit, acute, stroke suspected. EXAM: CT ANGIOGRAPHY HEAD AND NECK TECHNIQUE: Multidetector CT imaging of the head and  neck was performed using the standard protocol during bolus administration of intravenous contrast. Multiplanar CT image reconstructions and MIPs were obtained to evaluate the vascular anatomy. Carotid stenosis measurements (when applicable) are obtained utilizing NASCET criteria, using the distal internal carotid diameter as the denominator. CONTRAST:  18m OMNIPAQUE IOHEXOL 350 MG/ML SOLN COMPARISON:  MRI/MRA head 11/13/2020. Head CT 11/12/2020. FINDINGS: CT HEAD FINDINGS Brain: Mild generalized cerebral atrophy. Known small acute infarcts within the right frontal white matter and left frontal lobe cortex were better appreciated on the brain MRI of 11/13/2020. Background moderate patchy and ill-defined hypoattenuation within the cerebral white matter, nonspecific but compatible chronic small vessel ischemic disease. Redemonstrated small chronic cortical infarct within the anterior left frontal lobe. There is no acute intracranial hemorrhage.  No extra-axial fluid collection. No evidence of an intracranial mass. No midline shift. Vascular: No hyperdense vessel.  Atherosclerotic calcifications. Skull: Normal. Negative for fracture or focal lesion. Sinuses: No significant paranasal sinus disease. Orbits: No mass or acute finding. Other: Small right mastoid effusion. Review of the MIP images confirms the above findings CTA NECK FINDINGS Aortic arch: Standard aortic branching. The innominate artery origin is excluded from the field of view. Atherosclerotic plaque within the visualized aortic arch and proximal major branch vessels of the neck. Soft and calcified plaque within the proximal right subclavian artery results in 65% stenosis. No significant hemodynamically significant stenosis within the visualized innominate artery or proximal left subclavian artery. Right carotid system: CCA and ICA patent within the neck. Soft and calcified plaque within the proximal ICA with resultant 40% stenosis. Left carotid system:  CCA and ICA patent within the neck without stenosis. Minimal soft plaque within the CCA. Mild calcified plaque at the carotid bifurcation. Vertebral arteries: Codominant and patent within the neck without stenosis. Skeleton: Cervical spondylosis with suspected degenerative fusion across the C4-C5 and C5-C6 disc spaces and multilevel facet joint ankylosis. No acute bony abnormality or aggressive osseous lesion. Other neck: No cervical lymphadenopathy. Multiple thyroid nodules, the largest within the right lobe measuring 2.0 cm. Fatty atrophy of the bilateral parotid glands. Upper chest: No consolidation within the imaged lung apices. Review of the MIP images confirms the above findings CTA HEAD FINDINGS Anterior circulation: The intracranial internal carotid arteries are patent. Calcified plaque within both vessels without stenosis. The M1 middle cerebral arteries are patent. Atherosclerotic irregularity of the M2 and more distal middle cerebral arteries bilaterally. No M2 proximal branch occlusion or high-grade proximal stenosis is identified. The anterior cerebral arteries are patent. 1-2 mm inferiorly projecting vascular protrusion arising from the supraclinoid right ICA, which may reflect an aneurysm or infundibulum (series 12, image 95). Posterior circulation: The intracranial vertebral arteries are patent. The basilar artery is patent. Mild atherosclerotic irregularity of these vessels without stenosis. The posterior cerebral arteries are patent. Mild atherosclerotic irregularity of both vessels without significant proximal stenosis. Posterior communicating arteries are hypoplastic or absent bilaterally. Venous sinuses: Within the limitations of contrast timing, no convincing thrombus. Anatomic variants: As described Review of the MIP images confirms the above findings IMPRESSION: CT head: 1. Known small acute infarcts within the right frontal lobe white matter and left frontal lobe cortex were better  appreciated on the brain MRI of 11/13/2020. 2. Redemonstrated small chronic cortical infarct within the anterior left frontal lobe. 3. Background moderate cerebral white matter chronic small vessel ischemic disease. 4. Mild generalized cerebral atrophy. 5. Small right mastoid effusion. CTA neck: 1. Atherosclerotic plaque within the proximal right subclavian artery results in 65% stenosis. 2. The bilateral common carotid and internal carotid arteries are patent within the neck. Atherosclerotic plaque within the proximal right ICA results in 40% stenosis. Minimal atherosclerotic plaque within the left carotid system within the neck. 3. Vertebral arteries codominant and patent within the neck without stenosis. 4. Multiple thyroid nodules, the largest within the right lobe measuring 2 cm. A dedicated thyroid ultrasound may be obtained for further evaluation as clinically warranted (given the patient's advanced age). CTA head: 1. Atherosclerotic disease. No intracranial large vessel occlusion or proximal high-grade stenosis. 2. 1-2 mm inferiorly projecting vascular protrusion arising from the supraclinoid right ICA, which may reflect an aneurysm or infundibulum. Electronically Signed   By: Kellie Simmering DO   On: 11/14/2020 13:43   CT Head Wo  Contrast  Result Date: 11/12/2020 CLINICAL DATA:  Delirium.  Confusion. EXAM: CT HEAD WITHOUT CONTRAST TECHNIQUE: Contiguous axial images were obtained from the base of the skull through the vertex without intravenous contrast. COMPARISON:  Head CT 10/15/2015 FINDINGS: Brain: Stable degree of atrophy there is moderate chronic small vessel ischemia with progression from 2017. no intracranial hemorrhage, mass effect, or midline shift. No hydrocephalus. The basilar cisterns are patent. No evidence of territorial infarct or acute ischemia. No extra-axial or intracranial fluid collection. Vascular: Atherosclerosis of skullbase vasculature without hyperdense vessel or abnormal  calcification. Skull: No fracture or focal lesion. Sinuses/Orbits: Minor mucosal thickening of ethmoid air cells. No sinus fluid levels. Hypoplastic right mastoid air cells. No significant mastoid effusion. Unremarkable orbits. Other: None. IMPRESSION: 1. No acute intracranial abnormality. 2. Generalized atrophy and chronic small vessel ischemia. Electronically Signed   By: Keith Rake M.D.   On: 11/12/2020 18:06   CT ANGIO NECK W OR WO CONTRAST  Result Date: 11/14/2020 CLINICAL DATA:  Neuro deficit, acute, stroke suspected. EXAM: CT ANGIOGRAPHY HEAD AND NECK TECHNIQUE: Multidetector CT imaging of the head and neck was performed using the standard protocol during bolus administration of intravenous contrast. Multiplanar CT image reconstructions and MIPs were obtained to evaluate the vascular anatomy. Carotid stenosis measurements (when applicable) are obtained utilizing NASCET criteria, using the distal internal carotid diameter as the denominator. CONTRAST:  145m OMNIPAQUE IOHEXOL 350 MG/ML SOLN COMPARISON:  MRI/MRA head 11/13/2020. Head CT 11/12/2020. FINDINGS: CT HEAD FINDINGS Brain: Mild generalized cerebral atrophy. Known small acute infarcts within the right frontal white matter and left frontal lobe cortex were better appreciated on the brain MRI of 11/13/2020. Background moderate patchy and ill-defined hypoattenuation within the cerebral white matter, nonspecific but compatible chronic small vessel ischemic disease. Redemonstrated small chronic cortical infarct within the anterior left frontal lobe. There is no acute intracranial hemorrhage. No extra-axial fluid collection. No evidence of an intracranial mass. No midline shift. Vascular: No hyperdense vessel.  Atherosclerotic calcifications. Skull: Normal. Negative for fracture or focal lesion. Sinuses: No significant paranasal sinus disease. Orbits: No mass or acute finding. Other: Small right mastoid effusion. Review of the MIP images confirms  the above findings CTA NECK FINDINGS Aortic arch: Standard aortic branching. The innominate artery origin is excluded from the field of view. Atherosclerotic plaque within the visualized aortic arch and proximal major branch vessels of the neck. Soft and calcified plaque within the proximal right subclavian artery results in 65% stenosis. No significant hemodynamically significant stenosis within the visualized innominate artery or proximal left subclavian artery. Right carotid system: CCA and ICA patent within the neck. Soft and calcified plaque within the proximal ICA with resultant 40% stenosis. Left carotid system: CCA and ICA patent within the neck without stenosis. Minimal soft plaque within the CCA. Mild calcified plaque at the carotid bifurcation. Vertebral arteries: Codominant and patent within the neck without stenosis. Skeleton: Cervical spondylosis with suspected degenerative fusion across the C4-C5 and C5-C6 disc spaces and multilevel facet joint ankylosis. No acute bony abnormality or aggressive osseous lesion. Other neck: No cervical lymphadenopathy. Multiple thyroid nodules, the largest within the right lobe measuring 2.0 cm. Fatty atrophy of the bilateral parotid glands. Upper chest: No consolidation within the imaged lung apices. Review of the MIP images confirms the above findings CTA HEAD FINDINGS Anterior circulation: The intracranial internal carotid arteries are patent. Calcified plaque within both vessels without stenosis. The M1 middle cerebral arteries are patent. Atherosclerotic irregularity of the M2 and more distal middle  cerebral arteries bilaterally. No M2 proximal branch occlusion or high-grade proximal stenosis is identified. The anterior cerebral arteries are patent. 1-2 mm inferiorly projecting vascular protrusion arising from the supraclinoid right ICA, which may reflect an aneurysm or infundibulum (series 12, image 95). Posterior circulation: The intracranial vertebral arteries  are patent. The basilar artery is patent. Mild atherosclerotic irregularity of these vessels without stenosis. The posterior cerebral arteries are patent. Mild atherosclerotic irregularity of both vessels without significant proximal stenosis. Posterior communicating arteries are hypoplastic or absent bilaterally. Venous sinuses: Within the limitations of contrast timing, no convincing thrombus. Anatomic variants: As described Review of the MIP images confirms the above findings IMPRESSION: CT head: 1. Known small acute infarcts within the right frontal lobe white matter and left frontal lobe cortex were better appreciated on the brain MRI of 11/13/2020. 2. Redemonstrated small chronic cortical infarct within the anterior left frontal lobe. 3. Background moderate cerebral white matter chronic small vessel ischemic disease. 4. Mild generalized cerebral atrophy. 5. Small right mastoid effusion. CTA neck: 1. Atherosclerotic plaque within the proximal right subclavian artery results in 65% stenosis. 2. The bilateral common carotid and internal carotid arteries are patent within the neck. Atherosclerotic plaque within the proximal right ICA results in 40% stenosis. Minimal atherosclerotic plaque within the left carotid system within the neck. 3. Vertebral arteries codominant and patent within the neck without stenosis. 4. Multiple thyroid nodules, the largest within the right lobe measuring 2 cm. A dedicated thyroid ultrasound may be obtained for further evaluation as clinically warranted (given the patient's advanced age). CTA head: 1. Atherosclerotic disease. No intracranial large vessel occlusion or proximal high-grade stenosis. 2. 1-2 mm inferiorly projecting vascular protrusion arising from the supraclinoid right ICA, which may reflect an aneurysm or infundibulum. Electronically Signed   By: Kellie Simmering DO   On: 11/14/2020 13:43   MR ANGIO HEAD WO CONTRAST  Result Date: 11/13/2020 CLINICAL DATA:  Mental status  change.  Unknown cause. EXAM: MRI HEAD WITHOUT CONTRAST MRA HEAD WITHOUT CONTRAST TECHNIQUE: Multiplanar, multi-echo pulse sequences of the brain and surrounding structures were acquired without intravenous contrast. Angiographic images of the Circle of Willis were acquired using MRA technique without intravenous contrast. COMPARISON:  MRI/MRI October 24, 2015. CT head from yesterday. FINDINGS: MRI HEAD FINDINGS Brain: Acute infarct in the right frontal white matter (series 5, image 23). Additional small mild area of restricted diffusion in the left frontal cortex (series 5, image 21). No significant edema or mass effect. No acute hemorrhage. Additional faint focus of DWI hyperintensity in the more anterior left frontal cortex (series 5, image 24) is associated with an area of susceptibility artifact and favored to represent artifact from mineralization when correlating with prior CT head. No evidence of acute hemorrhage, hydrocephalus, mass lesion, midline shift, extra-axial fluid collection. On susceptibility weighted imaging there is subtle sulcal susceptibility artifact along the bifrontal convexities, suggestive of superficial siderosis. Moderate scattered T2/FLAIR hyperintensities within the white matter, nonspecific but most likely related to chronic microvascular ischemic disease. Mild for age atrophy. Vascular: See below. Skull and upper cervical spine: Normal marrow signal. Sinuses/Orbits: Mild ethmoid air cell mucosal thickening. Unremarkable orbits. Other: Small bilateral mastoid effusions. MRA HEAD FINDINGS Anterior circulation: Bilateral ICAs, MCAs, in the ACAs are patent. Similar mild moderate stenosis of the left MCA origin. Suspected at least moderate stenosis of the right M1 MCA, which may be accentuated by motion and downward flow on this noncontrast MRA. Posterior circulation: Bilateral vertebral arteries, basilar artery, and posterior cerebral  arteries are patent. Suspected at least moderate right  vertebral artery stenosis. Bilateral PCAs are patent with likely mild right PCA stenosis. IMPRESSION: MRI: 1. 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 Possible additional small acute infarct in the left frontal cortex. No significant edema or mass effect. Given potential involvement of multiple vascular territories, consider a central embolic etiology. 2. 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 3. Moderate chronic microvascular disease and mild atrophy. MRA: 1. Motion limited evaluation with suspected at least moderate stenosis of the right M1 MCA and right intradural vertebral artery. 2. Mild-to-moderate left M1 MCA stenosis. Electronically Signed   By: Margaretha Sheffield MD   On: 11/13/2020 12:09   MR BRAIN WO CONTRAST  Result Date: 11/13/2020 CLINICAL DATA:  Mental status change.  Unknown cause. EXAM: MRI HEAD WITHOUT CONTRAST MRA HEAD WITHOUT CONTRAST TECHNIQUE: Multiplanar, multi-echo pulse sequences of the brain and surrounding structures were acquired without intravenous contrast. Angiographic images of the Circle of Willis were acquired using MRA technique without intravenous contrast. COMPARISON:  MRI/MRI October 24, 2015. CT head from yesterday. FINDINGS: MRI HEAD FINDINGS Brain: Acute infarct in the right frontal white matter (series 5, image 23). Additional small mild area of restricted diffusion in the left frontal cortex (series 5, image 21). No significant edema or mass effect. No acute hemorrhage. Additional faint focus of DWI hyperintensity in the more anterior left frontal cortex (series 5, image 24) is associated with an area of susceptibility artifact and favored to represent artifact from mineralization when correlating with prior CT head. No evidence of acute hemorrhage, hydrocephalus, mass lesion, midline shift, extra-axial fluid collection. On  susceptibility weighted imaging there is subtle sulcal susceptibility artifact along the bifrontal convexities, suggestive of superficial siderosis. Moderate scattered T2/FLAIR hyperintensities within the white matter, nonspecific but most likely related to chronic microvascular ischemic disease. Mild for age atrophy. Vascular: See below. Skull and upper cervical spine: Normal marrow signal. Sinuses/Orbits: Mild ethmoid air cell mucosal thickening. Unremarkable orbits. Other: Small bilateral mastoid effusions. MRA HEAD FINDINGS Anterior circulation: Bilateral ICAs, MCAs, in the ACAs are patent. Similar mild moderate stenosis of the left MCA origin. Suspected at least moderate stenosis of the right M1 MCA, which may be accentuated by motion and downward flow on this noncontrast MRA. Posterior circulation: Bilateral vertebral arteries, basilar artery, and posterior cerebral arteries are patent. Suspected at least moderate right vertebral artery stenosis. Bilateral PCAs are patent with likely mild right PCA stenosis. IMPRESSION: MRI: 1. 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 Possible additional small acute infarct in the left frontal cortex. No significant edema or mass effect. Given potential involvement of multiple vascular territories, consider a central embolic etiology. 2. 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 3. Moderate chronic microvascular disease and mild atrophy. MRA: 1. Motion limited evaluation with suspected at least moderate stenosis of the right M1 MCA and right intradural vertebral artery. 2. Mild-to-moderate left M1 MCA stenosis. Electronically Signed   By: Margaretha Sheffield MD   On: 11/13/2020 12:09   DG Chest Portable 1 View  Result Date: 11/12/2020 CLINICAL DATA:  Altered mental status EXAM: PORTABLE CHEST 1 VIEW COMPARISON:  12/14/2019  FINDINGS: The heart size and mediastinal contours are within normal limits. Both lungs are clear. Metallic device overlies the left perihilar region,  likely external to the patient. The visualized skeletal structures are unremarkable. IMPRESSION: No active disease. Electronically Signed   By: Fidela Salisbury MD   On: 11/12/2020 18:28   ECHOCARDIOGRAM COMPLETE  Result Date: 11/13/2020    ECHOCARDIOGRAM REPORT   Patient Name:   ADLENE WURZEL Date of Exam: 11/13/2020 Medical Rec #:  [id number]        Height:       60.0 in Accession #:    [id number]       Weight:       109.0 lb Date of Birth:  [birth year]        BSA:          1.442 m Patient Age:    29 years         BP:           84/42 mmHg Patient Gender: F                HR:           67 bpm. Exam Location:  [facility] Procedure: 2D Echo, Cardiac Doppler and Color Doppler Indications:    Abnormal EKG  History:        Patient has prior history of Echocardiogram examinations, most                 recent 10/16/2015. Arrythmias:Abnormal EKG, Signs/Symptoms:Very                 altered mental status; Risk Factors:Hypertension and                 Dyslipidemia.  Sonographer:    Dustin Flock RDCS Referring Phys: [place]  1. Left ventricular ejection fraction, by estimation, is 60 to 65%. The left ventricle has normal function. The left ventricle has no regional wall motion abnormalities. There is mild left ventricular hypertrophy. Left ventricular diastolic parameters are indeterminate.  2. Right ventricular systolic function is normal. The right ventricular size is normal. There is normal pulmonary artery systolic pressure. The estimated right ventricular systolic pressure is 123456 mmHg.  3. There is a trivial pericardial effusion that is circumferential.  4. The mitral valve is grossly normal. No evidence of mitral valve regurgitation.  5. The aortic valve is tricuspid. There is mild calcification of the aortic valve. Aortic valve  regurgitation is trivial.  6. The inferior vena cava is normal in size with greater than 50% respiratory variability, suggesting right atrial pressure of 3 mmHg. FINDINGS  Left Ventricle: Left ventricular ejection fraction, by estimation, is 60 to 65%. The left ventricle has normal function. The left ventricle has no regional wall motion abnormalities. The left ventricular internal cavity size was normal in size. There is  mild left ventricular hypertrophy. Abnormal (paradoxical) septal motion, consistent with left bundle branch block. Left ventricular diastolic parameters are indeterminate. Right Ventricle: The right ventricular size is normal. No increase in right ventricular wall thickness. Right ventricular systolic function is normal. There is normal pulmonary artery systolic pressure. The tricuspid regurgitant velocity is 2.13 m/s, and  with an assumed right atrial pressure of 3 mmHg, the estimated right ventricular systolic pressure is 123456 mmHg. Left Atrium: Left atrial size was normal in size. Right Atrium: Right atrial size was normal in size. Pericardium: Trivial pericardial effusion is present. The pericardial effusion is circumferential. Mitral Valve: The mitral valve is grossly normal. No evidence of mitral valve regurgitation. Tricuspid Valve: The tricuspid valve is grossly normal. Tricuspid valve regurgitation is  trivial. Aortic Valve: The aortic valve is tricuspid. There is mild calcification of the aortic valve. Aortic valve regurgitation is trivial. Aortic regurgitation PHT measures 359 msec. Pulmonic Valve: The pulmonic valve was grossly normal. Pulmonic valve regurgitation is trivial. Aorta: The aortic root is normal in size and structure. Venous: The inferior vena cava is normal in size with greater than 50% respiratory variability, suggesting right atrial pressure of 3 mmHg. IAS/Shunts: No atrial level shunt detected by color flow Doppler.  LEFT VENTRICLE PLAX 2D LVIDd:         3.43 cm   Diastology LVIDs:         2.29 cm  LV e' medial:    6.83 cm/s LV PW:         1.25 cm  LV E/e' medial:  12.1 LV IVS:        1.25 cm  LV e' lateral:   8.27 cm/s LVOT diam:     1.90 cm  LV E/e' lateral: 10.0 LV SV:         54 LV SV Index:   37 LVOT Area:     2.84 cm  RIGHT VENTRICLE RV S prime:     14.90 cm/s RVOT diam:      2.60 cm TAPSE (M-mode): 1.5 cm LEFT ATRIUM             Index       RIGHT ATRIUM           Index LA diam:        2.70 cm 1.87 cm/m  RA Area:     10.70 cm LA Vol (A2C):   27.1 ml 18.79 ml/m RA Volume:   22.50 ml  15.60 ml/m LA Vol (A4C):   31.0 ml 21.50 ml/m LA Biplane Vol: 31.4 ml 21.77 ml/m  AORTIC VALVE LVOT Vmax:   105.00 cm/s LVOT Vmean:  69.100 cm/s LVOT VTI:    0.189 m AI PHT:      359 msec  AORTA Ao Root diam: 2.70 cm MITRAL VALVE                TRICUSPID VALVE MV Area (PHT): 5.88 cm     TR Peak grad:   18.1 mmHg MV Decel Time: 129 msec     TR Vmax:        213.00 cm/s MV E velocity: 82.40 cm/s MV A velocity: 120.00 cm/s  SHUNTS MV E/A ratio:  0.69         Systemic VTI:  0.19 m                             Systemic Diam: 1.90 cm                             Pulmonic Diam: 2.60 cm Rozann Lesches MD Electronically signed by Rozann Lesches MD Signature Date/Time: 11/13/2020/12:21:12 PM    Final    US THYROID  Result Date: 11/14/2020 CLINICAL DATA:  Incidental on CT. EXAM: THYROID ULTRASOUND TECHNIQUE: Ultrasound examination of the thyroid gland and adjacent soft tissues was performed. COMPARISON:  CT scan performed earlier today FINDINGS: Parenchymal Echotexture: Mildly heterogenous Isthmus: 0.2 cm Right lobe: 5.4 x 2.3 x 1.3 cm Left lobe: 4.8 x 1.8 x 1.2 cm _________________________________________________________ Estimated total number of nodules >/= 1 cm: 3 Number of spongiform nodules >/=  2 cm not described below (TR1): 0 Number  of mixed cystic and solid nodules >/= 1.5 cm not described below ([place]): 0 _________________________________________________________ Nodule # 1: 1.5 x 1.0  x 1.1 cm spongiform nodule in the right upper gland. This is a benign morphology and no further follow-up is required. _________________________________________________________ Nodule # 2: Location: Right; Mid Maximum size: 1.4 cm; Other 2 dimensions: 0.8 x 1.4 cm Composition: solid/almost completely solid (2) Echogenicity: hypoechoic (2) Shape: not taller-than-wide (0) Margins: smooth (0) Echogenic foci: none (0) ACR TI-RADS total points: 4. ACR TI-RADS risk category: TR4 (4-6 points). ACR TI-RADS recommendations: *Given size (>/= 1 - 1.4 cm) and appearance, a follow-up ultrasound in 1 year should be considered based on TI-RADS criteria. _________________________________________________________ Nodule # 4: Small isoechoic predominantly solid nodule in the left superior gland measures up to 1.3 cm. This nodule does not meet criteria for biopsy or dedicated imaging surveillance. _________________________________________________________ Multiple additional small subcentimeter cysts and nodules present scattered throughout the gland. None meet criteria to warrant further evaluation. IMPRESSION: 1. Heterogeneous, enlarged and multinodular thyroid gland most consistent with multinodular goiter. 2. A 1.4 cm TI-RADS category 4 nodule in the right mid gland meets criteria for imaging surveillance. Recommend follow-up ultrasound in 1 year. 3. Additional bilateral thyroid nodules also noted incidentally but do not meet criteria to recommend further evaluation. The above is in keeping with the ACR TI-RADS recommendations - J Am Coll Radiol 2017;14:587-595. Electronically Signed   By: Jacqulynn Cadet M.D.   On: 11/14/2020 15:46    Subjective:   Patient was seen and examined 11/19/2020, 9:42 AM Patient stable today. No acute distress.  No issues overnight Stable for discharge.  Discharge Exam:    Vitals:   11/18/20 1444 11/18/20 2121 11/19/20 0008 11/19/20 0417  BP: (!) 128/58 (!) 147/72 (!) 136/50 (!) 139/59   Pulse: 77 87 74 76  Resp: '18 18 18 16  '$ Temp: 98.2 F (36.8 C) 98.5 F (36.9 C) 98.7 F (37.1 C) 99.2 F (37.3 C)  TempSrc: Oral Oral Oral Oral  SpO2: 100% 100% 97% 97%  Weight:      Height:           Physical Exam:   General:  Alert, oriented, cooperative, no distress;  Baseline confusion,  HEENT:  Normocephalic, PERRL, otherwise with in Normal limits   Neuro:  CNII-XII intact. , normal motor and sensation, reflexes intact   Lungs:   Clear to auscultation BL, Respirations unlabored, no wheezes / crackles  Cardio:    S1/S2, RRR, No murmure, No Rubs or Gallops   Abdomen:   Soft, non-tender, bowel sounds active all four quadrants,  no guarding or peritoneal signs.  Muscular skeletal:  Global symmetric weakness, Limited exam - in bed, able to move all 4 extremities, Normal strength,  2+ pulses,  symmetric, No pitting edema  Skin:  Dry, warm to touch, negative for any Rashes,  Wounds: Please see nursing documentation              The results of significant diagnostics from this hospitalization (including imaging, microbiology, ancillary and laboratory) are listed below for reference.      Microbiology:   Recent Results (from the past 240 hour(s))  Resp Panel by RT-PCR (Flu A&B, Covid) Nasopharyngeal Swab     Status: None   Collection Time: 11/12/20  7:27 PM   Specimen: Nasopharyngeal Swab; Nasopharyngeal(NP) swabs in vial transport medium  Result Value Ref Range Status   SARS Coronavirus 2 by RT PCR NEGATIVE NEGATIVE Final    Comment: (NOTE)  SARS-CoV-2 target nucleic acids are NOT DETECTED.  The SARS-CoV-2 RNA is generally detectable in upper respiratory specimens during the acute phase of infection. The lowest concentration of SARS-CoV-2 viral copies this assay can detect is 138 copies/mL. A negative result does not preclude SARS-Cov-2 infection and should not be used as the sole basis for treatment or other patient management decisions. A negative result may  occur with  improper specimen collection/handling, submission of specimen other than nasopharyngeal swab, presence of viral mutation(s) within the areas targeted by this assay, and inadequate number of viral copies(<138 copies/mL). A negative result must be combined with clinical observations, patient history, and epidemiological information. The expected result is Negative.  Fact Sheet for Patients:  EntrepreneurPulse.com.au  Fact Sheet for Healthcare Providers:  IncredibleEmployment.be  This test is no t yet approved or cleared by the [place] FDA and  has been authorized for detection and/or diagnosis of SARS-CoV-2 by FDA under an Emergency Use Authorization (EUA). This EUA will remain  in effect (meaning this test can be used) for the duration of the COVID-19 declaration under Section 564(b)(1) of the Act, 21 U.S.C.section 360bbb-3(b)(1), unless the authorization is terminated  or revoked sooner.       Influenza A by PCR NEGATIVE NEGATIVE Final   Influenza B by PCR NEGATIVE NEGATIVE Final    Comment: (NOTE) The Xpert Xpress SARS-CoV-2/FLU/RSV plus assay is intended as an aid in the diagnosis of influenza from Nasopharyngeal swab specimens and should not be used as a sole basis for treatment. Nasal washings and aspirates are unacceptable for Xpert Xpress SARS-CoV-2/FLU/RSV testing.  Fact Sheet for Patients: EntrepreneurPulse.com.au  Fact Sheet for Healthcare Providers: IncredibleEmployment.be  This test is not yet approved or cleared by the [place] FDA and has been authorized for detection and/or diagnosis of SARS-CoV-2 by FDA under an Emergency Use Authorization (EUA). This EUA will remain in effect (meaning this test can be used) for the duration of the COVID-19 declaration under Section 564(b)(1) of the Act, 21 U.S.C. section 360bbb-3(b)(1), unless the authorization is terminated  or revoked.  Performed at [facility], [address], [place], [place] [address]   SARS CORONAVIRUS 2 (TAT 6-24 HRS) Nasopharyngeal Nasopharyngeal Swab     Status: None   Collection Time: 11/17/20 12:05 PM   Specimen: Nasopharyngeal Swab  Result Value Ref Range Status   SARS Coronavirus 2 NEGATIVE NEGATIVE Final    Comment: (NOTE) SARS-CoV-2 target nucleic acids are NOT DETECTED.  The SARS-CoV-2 RNA is generally detectable in upper and lower respiratory specimens during the acute phase of infection. Negative results do not preclude SARS-CoV-2 infection, do not rule out co-infections with other pathogens, and should not be used as the sole basis for treatment or other patient management decisions. Negative results must be combined with clinical observations, patient history, and epidemiological information. The expected result is Negative.  Fact Sheet for Patients: SugarRoll.be  Fact Sheet for Healthcare Providers: https://www.woods-mathews.com/  This test is not yet approved or cleared by the [place] FDA and  has been authorized for detection and/or diagnosis of SARS-CoV-2 by FDA under an Emergency Use Authorization (EUA). This EUA will remain  in effect (meaning this test can be used) for the duration of the COVID-19 declaration under Se ction 564(b)(1) of the Act, 21 U.S.C. section 360bbb-3(b)(1), unless the authorization is terminated or revoked sooner.  Performed at [facility], [place] [address], [place], [place] [address]      Labs:   CBC: Recent Labs  Lab 11/12/20 1615 11/13/20 0411  WBC 4.0 6.8  NEUTROABS 2.4  --   HGB 14.0 13.0  HCT 41.8 38.5  MCV 97.7 97.0  PLT 229 [id number]   Basic Metabolic Panel: Recent Labs  Lab 11/12/20 1615 11/13/20 0411 11/14/20 0520 11/19/20 0626  NA 137 137 138  --   K 3.8 3.3* 5.0  --   CL 102 102 104  --   CO2 '27 27 27  '$ --   GLUCOSE 92 119* 93  --   BUN '13 15 13  '$ --    CREATININE 0.69 0.79 0.75 0.67  CALCIUM 11.3* 10.6* 10.5*  --   MG  --  2.2  --   --   PHOS  --  2.1* 4.2  --    Liver Function Tests: Recent Labs  Lab 11/12/20 1615 11/13/20 0411 11/14/20 0520  AST 26 23  --   ALT 21 17  --   ALKPHOS 66 54  --   BILITOT 0.7 0.9  --   PROT 7.6 6.5  --   ALBUMIN 4.4 3.7 3.6   BNP (last 3 results) No results for input(s): BNP in the last 8760 hours. Cardiac Enzymes: No results for input(s): CKTOTAL, CKMB, CKMBINDEX, TROPONINI in the last 168 hours. CBG: Recent Labs  Lab 11/17/20 1123 11/17/20 1146 11/17/20 1310 11/17/20 1605 11/19/20 0418  GLUCAP 63* 71 130* 104* 92   Hgb A1c No results for input(s): HGBA1C in the last 72 hours. Lipid Profile No results for input(s): CHOL, HDL, LDLCALC, TRIG, CHOLHDL, LDLDIRECT in the last 72 hours. Thyroid function studies No results for input(s): TSH, T4TOTAL, T3FREE, THYROIDAB in the last 72 hours.  Invalid input(s): FREET3 Anemia work up No results for input(s): VITAMINB12, FOLATE, FERRITIN, TIBC, IRON, RETICCTPCT in the last 72 hours. Urinalysis    Component Value Date/Time   COLORURINE STRAW (A) 11/12/2020 1602   APPEARANCEUR CLEAR 11/12/2020 1602   LABSPEC 1.008 11/12/2020 1602   PHURINE 7.0 11/12/2020 1602   GLUCOSEU NEGATIVE 11/12/2020 1602   HGBUR NEGATIVE 11/12/2020 1602   BILIRUBINUR NEGATIVE 11/12/2020 1602   KETONESUR 5 (A) 11/12/2020 1602   PROTEINUR NEGATIVE 11/12/2020 1602   NITRITE NEGATIVE 11/12/2020 1602   LEUKOCYTESUR NEGATIVE 11/12/2020 1602         Time coordinating discharge: Over 45 minutes  SIGNED: Deatra James, MD, FACP, FHM. [facility],  Please use [email or web address] to Page If 7PM-7AM, please contact night-coverage [email or web address] [facility] 11/19/2020, 9:42 AM

## 2020-11-19 NOTE — TOC Transition Note (Signed)
Transition of Care [facility]) - CM/SW Discharge Note   Patient Details  Name: Peggy Ramirez MRN: [id number] Date of Birth: 11-25-1933  [facility] CM/SW Contact:  Salome Arnt, LCSW Phone Number: 11/19/2020, 1:09 PM   Clinical Narrative:  Pt d/c today. Ebony Hail with [facility] visited with pt and made bed offer. [name], [name] accepts. He reports family can transport today to SNF. D/C summary sent. COVID negative 11/17/20. Insurance authorization completed and provided to [facility]. RN given number to call report.        Barriers to Discharge: Barriers Resolved   Patient Goals and CMS Choice Patient states their goals for this hospitalization and ongoing recovery are:: rehab CMS Medicare.gov Compare Post Acute Care list provided to:: Patient Represenative (must comment) Choice offered to / list presented to : Adult Children  Discharge Placement              Patient chooses bed at: [facility] Patient to be transferred to facility by: family Name of family member notified: [name]- son Patient and family notified of of transfer: 11/19/20  Discharge Plan and Services In-house Referral: Clinical Social Work   Post Acute Care Choice: IP Rehab                               Social Determinants of Health (SDOH) Interventions     Readmission Risk Interventions No flowsheet data found.

## 2020-11-19 NOTE — Plan of Care (Signed)
  Problem: Education: Goal: Knowledge of General Education information will improve Description: Including pain rating scale, medication(s)/side effects and non-pharmacologic comfort measures Outcome: Adequate for Discharge   Problem: Health Behavior/Discharge Planning: Goal: Ability to manage health-related needs will improve Outcome: Adequate for Discharge   Problem: Clinical Measurements: Goal: Ability to maintain clinical measurements within normal limits will improve Outcome: Adequate for Discharge Goal: Will remain free from infection Outcome: Adequate for Discharge Goal: Diagnostic test results will improve Outcome: Adequate for Discharge Goal: Respiratory complications will improve Outcome: Adequate for Discharge Goal: Cardiovascular complication will be avoided Outcome: Adequate for Discharge   Problem: Activity: Goal: Risk for activity intolerance will decrease Outcome: Adequate for Discharge   Problem: Nutrition: Goal: Adequate nutrition will be maintained Outcome: Adequate for Discharge   Problem: Coping: Goal: Level of anxiety will decrease Outcome: Adequate for Discharge   Problem: Elimination: Goal: Will not experience complications related to bowel motility Outcome: Adequate for Discharge Goal: Will not experience complications related to urinary retention Outcome: Adequate for Discharge   Problem: Pain Managment: Goal: General experience of comfort will improve Outcome: Adequate for Discharge   Problem: Safety: Goal: Ability to remain free from injury will improve Outcome: Adequate for Discharge   Problem: Skin Integrity: Goal: Risk for impaired skin integrity will decrease Outcome: Adequate for Discharge   Problem: Education: Goal: Knowledge of disease or condition will improve Outcome: Adequate for Discharge   Problem: Education: Goal: Knowledge of disease or condition will improve Outcome: Adequate for Discharge Goal: Knowledge of  secondary prevention will improve Outcome: Adequate for Discharge Goal: Knowledge of patient specific risk factors addressed and post discharge goals established will improve Outcome: Adequate for Discharge Goal: Individualized Educational Video(s) Outcome: Adequate for Discharge   Problem: Coping: Goal: Will verbalize positive feelings about self Outcome: Adequate for Discharge Goal: Will identify appropriate support needs Outcome: Adequate for Discharge   Problem: Health Behavior/Discharge Planning: Goal: Ability to manage health-related needs will improve Outcome: Adequate for Discharge   Problem: Self-Care: Goal: Ability to participate in self-care as condition permits will improve Outcome: Adequate for Discharge Goal: Verbalization of feelings and concerns over difficulty with self-care will improve Outcome: Adequate for Discharge Goal: Ability to communicate needs accurately will improve Outcome: Adequate for Discharge   Problem: Nutrition: Goal: Risk of aspiration will decrease Outcome: Adequate for Discharge Goal: Dietary intake will improve Outcome: Adequate for Discharge   Problem: Ischemic Stroke/TIA Tissue Perfusion: Goal: Complications of ischemic stroke/TIA will be minimized Outcome: Adequate for Discharge

## 2020-11-19 NOTE — Care Management Important Message (Signed)
Important Message  Patient Details  Name: Peggy Ramirez MRN: [id number] Date of Birth: 03/23/1934   Medicare Important Message Given:  Yes     Tommy Medal 11/19/2020, 11:48 AM

## 2020-11-22 DIAGNOSIS — G9341 Metabolic encephalopathy: Secondary | ICD-10-CM | POA: Diagnosis not present

## 2020-11-22 DIAGNOSIS — K219 Gastro-esophageal reflux disease without esophagitis: Secondary | ICD-10-CM | POA: Diagnosis not present

## 2020-11-22 DIAGNOSIS — D750 Familial erythrocytosis: Secondary | ICD-10-CM | POA: Diagnosis not present

## 2020-11-22 DIAGNOSIS — E041 Nontoxic single thyroid nodule: Secondary | ICD-10-CM | POA: Diagnosis not present

## 2020-11-22 DIAGNOSIS — G4700 Insomnia, unspecified: Secondary | ICD-10-CM | POA: Diagnosis not present

## 2020-11-22 DIAGNOSIS — I639 Cerebral infarction, unspecified: Secondary | ICD-10-CM | POA: Diagnosis not present

## 2020-11-26 ENCOUNTER — Telehealth: Payer: Self-pay | Admitting: Family Medicine

## 2020-11-26 NOTE — Telephone Encounter (Signed)
Noted  

## 2020-11-26 NOTE — Telephone Encounter (Signed)
PER SON - HAD MINI STROKE IS IN [facility] AT THIS TIME. WILL CALL BACK TO RE-SCHEDULE

## 2020-11-28 ENCOUNTER — Ambulatory Visit (HOSPITAL_COMMUNITY): Payer: Medicare Other

## 2020-11-28 ENCOUNTER — Encounter (HOSPITAL_COMMUNITY): Payer: Medicare Other

## 2020-11-28 DIAGNOSIS — E041 Nontoxic single thyroid nodule: Secondary | ICD-10-CM | POA: Diagnosis not present

## 2020-11-28 DIAGNOSIS — G4700 Insomnia, unspecified: Secondary | ICD-10-CM | POA: Diagnosis not present

## 2020-11-28 DIAGNOSIS — K219 Gastro-esophageal reflux disease without esophagitis: Secondary | ICD-10-CM | POA: Diagnosis not present

## 2020-11-28 DIAGNOSIS — D750 Familial erythrocytosis: Secondary | ICD-10-CM | POA: Diagnosis not present

## 2020-11-28 DIAGNOSIS — I639 Cerebral infarction, unspecified: Secondary | ICD-10-CM | POA: Diagnosis not present

## 2020-11-29 DIAGNOSIS — E559 Vitamin D deficiency, unspecified: Secondary | ICD-10-CM | POA: Diagnosis not present

## 2020-12-05 DIAGNOSIS — Z7982 Long term (current) use of aspirin: Secondary | ICD-10-CM | POA: Diagnosis not present

## 2020-12-05 DIAGNOSIS — K5909 Other constipation: Secondary | ICD-10-CM | POA: Diagnosis not present

## 2020-12-05 DIAGNOSIS — Z7902 Long term (current) use of antithrombotics/antiplatelets: Secondary | ICD-10-CM | POA: Diagnosis not present

## 2020-12-05 DIAGNOSIS — G9341 Metabolic encephalopathy: Secondary | ICD-10-CM | POA: Diagnosis not present

## 2020-12-05 DIAGNOSIS — M329 Systemic lupus erythematosus, unspecified: Secondary | ICD-10-CM | POA: Diagnosis not present

## 2020-12-05 DIAGNOSIS — D750 Familial erythrocytosis: Secondary | ICD-10-CM | POA: Diagnosis not present

## 2020-12-05 DIAGNOSIS — G4700 Insomnia, unspecified: Secondary | ICD-10-CM | POA: Diagnosis not present

## 2020-12-05 DIAGNOSIS — I10 Essential (primary) hypertension: Secondary | ICD-10-CM | POA: Diagnosis not present

## 2020-12-05 DIAGNOSIS — Z87440 Personal history of urinary (tract) infections: Secondary | ICD-10-CM | POA: Diagnosis not present

## 2020-12-05 DIAGNOSIS — I69318 Other symptoms and signs involving cognitive functions following cerebral infarction: Secondary | ICD-10-CM | POA: Diagnosis not present

## 2020-12-05 DIAGNOSIS — K219 Gastro-esophageal reflux disease without esophagitis: Secondary | ICD-10-CM | POA: Diagnosis not present

## 2020-12-11 DIAGNOSIS — K219 Gastro-esophageal reflux disease without esophagitis: Secondary | ICD-10-CM | POA: Diagnosis not present

## 2020-12-11 DIAGNOSIS — K5909 Other constipation: Secondary | ICD-10-CM | POA: Diagnosis not present

## 2020-12-11 DIAGNOSIS — Z7982 Long term (current) use of aspirin: Secondary | ICD-10-CM | POA: Diagnosis not present

## 2020-12-11 DIAGNOSIS — Z87440 Personal history of urinary (tract) infections: Secondary | ICD-10-CM | POA: Diagnosis not present

## 2020-12-11 DIAGNOSIS — D750 Familial erythrocytosis: Secondary | ICD-10-CM | POA: Diagnosis not present

## 2020-12-11 DIAGNOSIS — G4700 Insomnia, unspecified: Secondary | ICD-10-CM | POA: Diagnosis not present

## 2020-12-11 DIAGNOSIS — I69318 Other symptoms and signs involving cognitive functions following cerebral infarction: Secondary | ICD-10-CM | POA: Diagnosis not present

## 2020-12-11 DIAGNOSIS — G9341 Metabolic encephalopathy: Secondary | ICD-10-CM | POA: Diagnosis not present

## 2020-12-11 DIAGNOSIS — M329 Systemic lupus erythematosus, unspecified: Secondary | ICD-10-CM | POA: Diagnosis not present

## 2020-12-11 DIAGNOSIS — I10 Essential (primary) hypertension: Secondary | ICD-10-CM | POA: Diagnosis not present

## 2020-12-11 DIAGNOSIS — Z7902 Long term (current) use of antithrombotics/antiplatelets: Secondary | ICD-10-CM | POA: Diagnosis not present

## 2020-12-13 DIAGNOSIS — K2100 Gastro-esophageal reflux disease with esophagitis, without bleeding: Secondary | ICD-10-CM | POA: Diagnosis not present

## 2020-12-13 DIAGNOSIS — I10 Essential (primary) hypertension: Secondary | ICD-10-CM | POA: Diagnosis not present

## 2020-12-13 DIAGNOSIS — I6930 Unspecified sequelae of cerebral infarction: Secondary | ICD-10-CM | POA: Diagnosis not present

## 2020-12-13 DIAGNOSIS — N183 Chronic kidney disease, stage 3 unspecified: Secondary | ICD-10-CM | POA: Diagnosis not present

## 2020-12-13 DIAGNOSIS — E7849 Other hyperlipidemia: Secondary | ICD-10-CM | POA: Diagnosis not present

## 2020-12-14 DIAGNOSIS — G9341 Metabolic encephalopathy: Secondary | ICD-10-CM | POA: Diagnosis not present

## 2020-12-14 DIAGNOSIS — M329 Systemic lupus erythematosus, unspecified: Secondary | ICD-10-CM | POA: Diagnosis not present

## 2020-12-14 DIAGNOSIS — Z87440 Personal history of urinary (tract) infections: Secondary | ICD-10-CM | POA: Diagnosis not present

## 2020-12-14 DIAGNOSIS — K5909 Other constipation: Secondary | ICD-10-CM | POA: Diagnosis not present

## 2020-12-14 DIAGNOSIS — I10 Essential (primary) hypertension: Secondary | ICD-10-CM | POA: Diagnosis not present

## 2020-12-14 DIAGNOSIS — I69318 Other symptoms and signs involving cognitive functions following cerebral infarction: Secondary | ICD-10-CM | POA: Diagnosis not present

## 2020-12-14 DIAGNOSIS — G4700 Insomnia, unspecified: Secondary | ICD-10-CM | POA: Diagnosis not present

## 2020-12-14 DIAGNOSIS — Z7902 Long term (current) use of antithrombotics/antiplatelets: Secondary | ICD-10-CM | POA: Diagnosis not present

## 2020-12-14 DIAGNOSIS — K219 Gastro-esophageal reflux disease without esophagitis: Secondary | ICD-10-CM | POA: Diagnosis not present

## 2020-12-14 DIAGNOSIS — Z7982 Long term (current) use of aspirin: Secondary | ICD-10-CM | POA: Diagnosis not present

## 2020-12-14 DIAGNOSIS — D750 Familial erythrocytosis: Secondary | ICD-10-CM | POA: Diagnosis not present

## 2020-12-15 ENCOUNTER — Inpatient Hospital Stay (HOSPITAL_COMMUNITY)
Admission: EM | Admit: 2020-12-15 | Discharge: 2020-12-17 | DRG: 378 | Disposition: A | Discharge status: Home / Self Care | Payer: Medicare Other | Attending: Internal Medicine | Admitting: Internal Medicine

## 2020-12-15 ENCOUNTER — Other Ambulatory Visit: Payer: Self-pay

## 2020-12-15 ENCOUNTER — Encounter (HOSPITAL_COMMUNITY): Payer: Self-pay

## 2020-12-15 DIAGNOSIS — F419 Anxiety disorder, unspecified: Secondary | ICD-10-CM | POA: Diagnosis present

## 2020-12-15 DIAGNOSIS — M3500 Sicca syndrome, unspecified: Secondary | ICD-10-CM | POA: Diagnosis not present

## 2020-12-15 DIAGNOSIS — Z9049 Acquired absence of other specified parts of digestive tract: Secondary | ICD-10-CM | POA: Diagnosis not present

## 2020-12-15 DIAGNOSIS — R634 Abnormal weight loss: Secondary | ICD-10-CM | POA: Diagnosis not present

## 2020-12-15 DIAGNOSIS — Z888 Allergy status to other drugs, medicaments and biological substances status: Secondary | ICD-10-CM

## 2020-12-15 DIAGNOSIS — Z90710 Acquired absence of both cervix and uterus: Secondary | ICD-10-CM

## 2020-12-15 DIAGNOSIS — K648 Other hemorrhoids: Secondary | ICD-10-CM | POA: Diagnosis present

## 2020-12-15 DIAGNOSIS — Z6820 Body mass index (BMI) 20.0-20.9, adult: Secondary | ICD-10-CM

## 2020-12-15 DIAGNOSIS — Z90722 Acquired absence of ovaries, bilateral: Secondary | ICD-10-CM

## 2020-12-15 DIAGNOSIS — Z882 Allergy status to sulfonamides status: Secondary | ICD-10-CM | POA: Diagnosis not present

## 2020-12-15 DIAGNOSIS — Z87440 Personal history of urinary (tract) infections: Secondary | ICD-10-CM

## 2020-12-15 DIAGNOSIS — K922 Gastrointestinal hemorrhage, unspecified: Secondary | ICD-10-CM

## 2020-12-15 DIAGNOSIS — Z7902 Long term (current) use of antithrombotics/antiplatelets: Secondary | ICD-10-CM | POA: Diagnosis not present

## 2020-12-15 DIAGNOSIS — Z880 Allergy status to penicillin: Secondary | ICD-10-CM | POA: Diagnosis not present

## 2020-12-15 DIAGNOSIS — I10 Essential (primary) hypertension: Secondary | ICD-10-CM | POA: Diagnosis not present

## 2020-12-15 DIAGNOSIS — K5909 Other constipation: Secondary | ICD-10-CM | POA: Diagnosis present

## 2020-12-15 DIAGNOSIS — Z79899 Other long term (current) drug therapy: Secondary | ICD-10-CM

## 2020-12-15 DIAGNOSIS — K5731 Diverticulosis of large intestine without perforation or abscess with bleeding: Principal | ICD-10-CM | POA: Diagnosis present

## 2020-12-15 DIAGNOSIS — Z20822 Contact with and (suspected) exposure to covid-19: Secondary | ICD-10-CM | POA: Diagnosis not present

## 2020-12-15 DIAGNOSIS — K219 Gastro-esophageal reflux disease without esophagitis: Secondary | ICD-10-CM | POA: Diagnosis not present

## 2020-12-15 DIAGNOSIS — D62 Acute posthemorrhagic anemia: Secondary | ICD-10-CM | POA: Diagnosis not present

## 2020-12-15 DIAGNOSIS — Z7982 Long term (current) use of aspirin: Secondary | ICD-10-CM

## 2020-12-15 DIAGNOSIS — Z881 Allergy status to other antibiotic agents status: Secondary | ICD-10-CM | POA: Diagnosis not present

## 2020-12-15 DIAGNOSIS — Z818 Family history of other mental and behavioral disorders: Secondary | ICD-10-CM

## 2020-12-15 DIAGNOSIS — F32A Depression, unspecified: Secondary | ICD-10-CM | POA: Diagnosis present

## 2020-12-15 DIAGNOSIS — K635 Polyp of colon: Secondary | ICD-10-CM | POA: Diagnosis not present

## 2020-12-15 DIAGNOSIS — E7800 Pure hypercholesterolemia, unspecified: Secondary | ICD-10-CM | POA: Diagnosis present

## 2020-12-15 DIAGNOSIS — Z8673 Personal history of transient ischemic attack (TIA), and cerebral infarction without residual deficits: Secondary | ICD-10-CM | POA: Diagnosis not present

## 2020-12-15 DIAGNOSIS — K921 Melena: Secondary | ICD-10-CM | POA: Diagnosis not present

## 2020-12-15 LAB — CBC
HCT: 31 % — ABNORMAL LOW (ref 36.0–46.0)
Hemoglobin: 10.4 g/dL — ABNORMAL LOW (ref 12.0–15.0)
MCH: 32.4 pg (ref 26.0–34.0)
MCHC: 33.5 g/dL (ref 30.0–36.0)
MCV: 96.6 fL (ref 80.0–100.0)
Platelets: 266 10*3/uL (ref 150–400)
RBC: 3.21 MIL/uL — ABNORMAL LOW (ref 3.87–5.11)
RDW: 11.8 % (ref 11.5–15.5)
WBC: 4.9 10*3/uL (ref 4.0–10.5)
nRBC: 0 % (ref 0.0–0.2)

## 2020-12-15 LAB — COMPREHENSIVE METABOLIC PANEL
ALT: 27 U/L (ref 0–44)
AST: 29 U/L (ref 15–41)
Albumin: 3.2 g/dL — ABNORMAL LOW (ref 3.5–5.0)
Alkaline Phosphatase: 64 U/L (ref 38–126)
Anion gap: 2 — ABNORMAL LOW (ref 5–15)
BUN: 12 mg/dL (ref 8–23)
CO2: 30 mmol/L (ref 22–32)
Calcium: 10 mg/dL (ref 8.9–10.3)
Chloride: 100 mmol/L (ref 98–111)
Creatinine, Ser: 0.65 mg/dL (ref 0.44–1.00)
GFR, Estimated: >60 mL/min (ref >60)
Glucose, Bld: 103 mg/dL — ABNORMAL HIGH (ref 70–99)
Potassium: 3.9 mmol/L (ref 3.5–5.1)
Sodium: 132 mmol/L — ABNORMAL LOW (ref 135–145)
Total Bilirubin: 0.4 mg/dL (ref 0.3–1.2)
Total Protein: 6.8 g/dL (ref 6.5–8.1)

## 2020-12-15 LAB — POC OCCULT BLOOD, ED: Fecal Occult Bld: POSITIVE — AB

## 2020-12-15 LAB — TYPE AND SCREEN
ABO/RH(D): O NEG
Antibody Screen: NEGATIVE

## 2020-12-15 LAB — RESP PANEL BY RT-PCR (FLU A&B, COVID) ARPGX2
Influenza A by PCR: NEGATIVE
Influenza B by PCR: NEGATIVE
SARS Coronavirus 2 by RT PCR: NEGATIVE

## 2020-12-15 MED ORDER — SODIUM CHLORIDE 0.9 % IV SOLN: INTRAVENOUS | Status: AC

## 2020-12-15 MED ORDER — SIMVASTATIN 20 MG PO TABS
20.0000 mg | ORAL_TABLET | Freq: Every day | ORAL | Status: DC
  Administered 2020-12-15 – 2020-12-17 (×3): 20 mg via ORAL
  Filled 2020-12-15: qty 1
  Filled 2020-12-15: qty 2
  Filled 2020-12-15: qty 1

## 2020-12-15 MED ORDER — ACETAMINOPHEN 650 MG RE SUPP: 650.0000 mg | Freq: Four times a day (QID) | RECTAL | Status: DC | PRN

## 2020-12-15 MED ORDER — ONDANSETRON HCL 4 MG/2ML IJ SOLN: 4.0000 mg | Freq: Four times a day (QID) | INTRAMUSCULAR | Status: DC | PRN

## 2020-12-15 MED ORDER — ONDANSETRON HCL 4 MG PO TABS: 4.0000 mg | ORAL_TABLET | Freq: Four times a day (QID) | ORAL | Status: DC | PRN

## 2020-12-15 MED ORDER — ALPRAZOLAM 0.5 MG PO TABS
0.5000 mg | ORAL_TABLET | Freq: Two times a day (BID) | ORAL | Status: DC | PRN
  Administered 2020-12-15 – 2020-12-16 (×2): 0.5 mg via ORAL
  Filled 2020-12-15 (×2): qty 1

## 2020-12-15 MED ORDER — PANTOPRAZOLE SODIUM 40 MG IV SOLR
40.0000 mg | Freq: Once | INTRAVENOUS | Status: AC
  Administered 2020-12-15: 40 mg via INTRAVENOUS
  Filled 2020-12-15: qty 40

## 2020-12-15 MED ORDER — EZETIMIBE 10 MG PO TABS
10.0000 mg | ORAL_TABLET | Freq: Every day | ORAL | Status: DC
  Administered 2020-12-15 – 2020-12-17 (×3): 10 mg via ORAL
  Filled 2020-12-15 (×3): qty 1

## 2020-12-15 MED ORDER — PANTOPRAZOLE SODIUM 40 MG PO TBEC
40.0000 mg | DELAYED_RELEASE_TABLET | Freq: Every day | ORAL | Status: DC
  Administered 2020-12-16: 40 mg via ORAL
  Filled 2020-12-15 (×2): qty 1

## 2020-12-15 MED ORDER — ACETAMINOPHEN 325 MG PO TABS: 650.0000 mg | ORAL_TABLET | Freq: Four times a day (QID) | ORAL | Status: DC | PRN

## 2020-12-15 MED ORDER — TRAZODONE HCL 50 MG PO TABS
50.0000 mg | ORAL_TABLET | Freq: Every day | ORAL | Status: DC
  Administered 2020-12-15 – 2020-12-16 (×2): 50 mg via ORAL
  Filled 2020-12-15 (×2): qty 1

## 2020-12-15 NOTE — H&P (Signed)
History and Physical    Peggy Ramirez [id number] DOB: Aug 28, 1933 DOA: 12/15/2020  PCP: Manon Hilding, MD   Patient coming from: Home  I have personally briefly reviewed patient's old medical records in [place]  Chief Complaint: Bright red blood per rectum.  HPI: Peggy Ramirez is a 87 y.o. female with medical history significant of chronic constipation, hyperlipidemia, hypertension, recent hospitalization for CVA (July 2022) when she was started on dual antiplatelet therapy for 30 days; who presented to the hospital secondary to bright red blood per rectum.  Patient expressed symptoms present for the last 3 days prior to admission.  No associated abdominal pain, nausea, vomiting, sick contacts, fever or unusual food intake.  Patient also denies chest pain, shortness of breath, dysuria, hematuria, blurred vision, headaches or any other complaints. Hemodynamically stable NAD;   ED Course: Hemoglobin demonstrating drop from 13 approximately 65-monthago down to 10.1; rest of her blood work unremarkable.  Positive fecal occult blood test.  Case discussed with gastroenterology service who is recommending holding aspirin and Plavix, full liquid diet and hospital admission; they will see patient on 12/16/2020 and might offered her colonoscopy evaluation on 12/17/20.  Large-bore IVs placement and type and screen done while in the ED.  Review of Systems: As per HPI otherwise all other systems reviewed and are negative.   Past Medical History:  Diagnosis Date   Chronic constipation    Chronic left lower quadrant pain    Headache(784.0)    High cholesterol    Hydrosalpinx    LEFT   Hypertension    [place]    Membranous glomerulonephritis    followed by Dr. RElige Radon  Ovarian fibroma    RIGHT   Pelvic adhesions    [place]    [facility]     Past Surgical History:  Procedure Laterality Date   APPENDECTOMY  04/28/2000   FRACTURE SURGERY     HIP  PINNING,CANNULATED Left 12/15/2019   Procedure: CANNULATED HIP PINNING;  Surgeon: HShona Needles MD;  [facility]  Service: Orthopedics;  Laterality: Left;   LAPAROSCOPIC CHOLECYSTECTOMY  04/28/2000   TONSILLECTOMY  04/29/1939   TOTAL ABDOMINAL HYSTERECTOMY W/ BILATERAL SALPINGOOPHORECTOMY  04/29/2003   VAGINAL HYSTERECTOMY  04/29/1979    Social History  reports that she has never smoked. She has never used smokeless tobacco. She reports that she does not drink alcohol and does not use drugs.  Allergies  Allergen Reactions   Atorvastatin Other (See Comments)    Muscle aches   Cellcept [Mycophenolate Mofetil] Other (See Comments)    unknown   Medrol [Methylprednisolone] Other (See Comments)    unknown   Mycophenolate Mofetil Other (See Comments)    unknown   Penicillins Itching   Sulfonamide Derivatives Nausea Only   Topiramate Other (See Comments)    unknown   Verapamil Other (See Comments)    Felt shakey   Levofloxacin Rash    Family History  Problem Relation Age of Onset   Heart disease Other    Anxiety disorder Other    Depression Other     Prior to Admission medications   Medication Sig Start Date End Date Taking? Authorizing Provider  ALPRAZolam (Duanne Moron 0.5 MG tablet Take 1 tablet (0.5 mg total) by mouth 3 (three) times daily as needed for anxiety. 11/19/20 12/19/20  SDeatra James MD  aspirin EC 81 MG tablet Take 81 mg by mouth daily. Swallow whole.    [provider]  carboxymethylcellulose 1 % ophthalmic solution Apply 1 drop to eye daily as needed.    [provider]  Cholecalciferol (VITAMIN D-3 PO) Take 1 tablet by mouth daily. Patient not taking: Reported on 11/12/2020    [provider]  clopidogrel (PLAVIX) 75 MG tablet Take 1 tablet (75 mg total) by mouth daily. 11/20/20 12/20/20  ShahmehdiValeria Batman, MD  docusate sodium (COLACE) 100 MG capsule Take 1 capsule (100 mg total) by mouth 2 (two) times daily. 11/19/20   Shahmehdi,  Valeria Batman, MD  ezetimibe (ZETIA) 10 MG tablet Take 1 tablet (10 mg total) by mouth daily at 6 PM. 11/19/20 12/19/20  Shahmehdi, Valeria Batman, MD  loratadine (CLARITIN) 10 MG tablet Take 10 mg by mouth daily.    [provider]  Misc. Devices (3-IN-1 BEDSIDE TOILET) MISC Patient needs 3 in 1 bedside toilet. Recent hip fracture with repair 12/17/19   Oretha Milch D, MD  Multiple Vitamin (MULTIVITAMIN) tablet Take 1 tablet by mouth daily.    [provider]  polyethylene glycol powder (GLYCOLAX/MIRALAX) 17 GM/SCOOP powder Take 17 g by mouth daily.      [provider]  simvastatin (ZOCOR) 20 MG tablet Take 1 tablet (20 mg total) by mouth daily at 6 PM. 11/19/20 12/19/20  Shahmehdi, Valeria Batman, MD  traZODone (DESYREL) 50 MG tablet Take 1 tablet (50 mg total) by mouth at bedtime. 11/19/20 12/19/20  Deatra James, MD  Vitamin E 400 units TABS Take 400 Units by mouth daily.    [provider]    Physical Exam: Vitals:   12/15/20 1143 12/15/20 1200 12/15/20 1230 12/15/20 1305  BP:  135/73 108/65 132/69  Pulse:  77 64 93  Resp:  '18 18 18  '$ Temp:      TempSrc:      SpO2:  100% 96% 99%  Weight: 49.4 kg     Height: '5\' 1"'$  (1.549 m)       Constitutional: NAD, calm, comfortable; no fever. Vitals:   12/15/20 1143 12/15/20 1200 12/15/20 1230 12/15/20 1305  BP:  135/73 108/65 132/69  Pulse:  77 64 93  Resp:  '18 18 18  '$ Temp:      TempSrc:      SpO2:  100% 96% 99%  Weight: 49.4 kg     Height: '5\' 1"'$  (1.549 m)      Eyes: PERRL, lids and conjunctivae normal, no icterus, no nystagmus. ENMT: Mucous membranes are moist. Posterior pharynx clear of any exudate or lesions. Neck: normal, supple, no masses, no thyromegaly, no JVD. Respiratory: clear to auscultation bilaterally, no wheezing, no crackles. Normal respiratory effort. No accessory muscle use.  Cardiovascular: Regular rate and rhythm, no murmurs / rubs / gallops. No extremity edema. 2+ pedal pulses. No carotid bruits.   Abdomen: no tenderness, no masses palpated. No hepatosplenomegaly. Bowel sounds positive.  Musculoskeletal: no clubbing / cyanosis. No joint deformity upper and lower extremities. Good ROM, no contractures. Normal muscle tone.  Skin: no rashes, lesions, ulcers. No induration Neurologic: CN 2-12 grossly intact. Sensation intact, DTR normal. Strength 5/5 in all 4.  Psychiatric: Normal judgment and insight. Alert and oriented x 3. Normal mood.   Labs on Admission: I have personally reviewed following labs and imaging studies  CBC: Recent Labs  Lab 12/15/20 1205  WBC 4.9  HGB 10.4*  HCT 31.0*  MCV 96.6  PLT 123456    Basic Metabolic Panel: Recent Labs  Lab 12/15/20 1205  NA 132*  K 3.9  CL 100  CO2 30  GLUCOSE 103*  BUN 12  CREATININE 0.65  CALCIUM 10.0    GFR: Estimated Creatinine Clearance: 38.8 mL/min (by C-G formula based on SCr of 0.65 mg/dL).  Liver Function Tests: Recent Labs  Lab 12/15/20 1205  AST 29  ALT 27  ALKPHOS 64  BILITOT 0.4  PROT 6.8  ALBUMIN 3.2*    Urine analysis:    Component Value Date/Time   COLORURINE STRAW (A) 11/12/2020 1602   APPEARANCEUR CLEAR 11/12/2020 1602   LABSPEC 1.008 11/12/2020 1602   PHURINE 7.0 11/12/2020 1602   GLUCOSEU NEGATIVE 11/12/2020 1602   HGBUR NEGATIVE 11/12/2020 1602   BILIRUBINUR NEGATIVE 11/12/2020 1602   KETONESUR 5 (A) 11/12/2020 1602   PROTEINUR NEGATIVE 11/12/2020 1602   NITRITE NEGATIVE 11/12/2020 1602   LEUKOCYTESUR NEGATIVE 11/12/2020 1602    Radiological Exams on Admission: No results found.  EKG: Independently reviewed.  No acute ischemic changes.  Assessment/Plan 1-lower gastrointestinal bleeding: With positive fecal occult blood test and description of bright red blood per rectum at home. -Given painless presentation there is concern for diverticular bleed versus internal hemorrhoids -Patient was recently started on aspirin and Plavix for secondary prevention after acute CVA. -Full liquid  diet and supportive care will be provided -GI service consulted and planning for potential colonoscopy evaluation on 822/22 (allowing Plavix to washout). -Holding aspirin and Plavix currently -Oral PPI started -No heparin products; SCD for prophylaxis.  2-essential hypertension -Continue home antihypertensive agents  3-hyperlipidemia -Will continue Zocor  4-anxiety/depression -Continue the use of as needed Xanax and nightly trazodone. -Mood is stable currently.  5-acute blood loss anemia -Hemoglobin down to 10.4 as mentioned above in the setting of acute lower GI bleed. -Complete work-up as mentioned in problem #1 -Transfuse as needed for hemoglobin less than 8   DVT prophylaxis: SCDs Code Status:   Full code Family Communication:  No family at bedside. Disposition Plan:   Patient is from:  Home  Anticipated DC to:  Home  Anticipated DC date:  No more than 48 hours  Anticipated DC barriers: Stabilization of patient's acute GI bleed presentation  Consults called:  GI service Admission status:  MedSurg, length of stay less than 2 midnights; observation status  Severity of Illness: The appropriate patient status for this patient is OBSERVATION. Observation status is judged to be reasonable and necessary in order to provide the required intensity of service to ensure the patient's safety. The patient's presenting symptoms, physical exam findings, and initial radiographic and laboratory data in the context of their medical condition is felt to place them at decreased risk for further clinical deterioration. Furthermore, it is anticipated that the patient will be medically stable for discharge from the hospital within 2 midnights of admission. The following factors support the patient status of observation.   " The patient's presenting symptoms include bright red blood per rectum. " The physical exam findings include positive fecal occult blood test and description of blood in her  stools. " The initial radiographic and laboratory data are decreasing hemoglobin of more than 2.5 g.   Barton Dubois MD [facility]  How to contact the [facility] Attending or Consulting provider [place] or covering provider during after hours [place], for this patient?   Check the care team in [facility] and look for a) attending/consulting [facility] provider listed and b) the [facility] team listed Log into [email or web address] and use [place]'s universal password to access. If you do not have  the password, please contact the hospital operator. Locate the [facility] provider you are looking for under [facility] and page to a number that you can be directly reached. If you still have difficulty reaching the provider, please page the [facility] (Director on Call) for the Hospitalists listed on amion for assistance.  12/15/2020, 1:47 PM

## 2020-12-15 NOTE — Plan of Care (Signed)
  Problem: Education: Goal: Knowledge of General Education information will improve Description Including pain rating scale, medication(s)/side effects and non-pharmacologic comfort measures Outcome: Progressing   Problem: Health Behavior/Discharge Planning: Goal: Ability to manage health-related needs will improve Outcome: Progressing   

## 2020-12-15 NOTE — ED Provider Notes (Signed)
[place] Provider Note   CSN: [id number] Arrival date & time: 12/15/20  1118     History Chief Complaint  Patient presents with   Rectal Bleeding    Peggy Ramirez is a 87 y.o. female with PMH Sjogren's disease, lupus, HTN, HLD, recurrent UTI, recent admission on 11/12/2020 and discharged 11/19/2020 for acute CVA who presents emergency department for evaluation of bright red blood per rectum.  Patient states that she was discharged on Plavix after her last admission and over the last 48 hours has had multiple episodes of bright red blood per rectum.  She denies fatigue, chest pain, shortness of breath, abdominal pain, nausea, vomiting, hematemesis or other systemic symptoms.  Denies rectal pain.  Does endorse an 18 pound weight loss over the last year.   Rectal Bleeding Associated symptoms: no abdominal pain, no fever and no vomiting       Past Medical History:  Diagnosis Date   Chronic constipation    Chronic left lower quadrant pain    Headache(784.0)    High cholesterol    Hydrosalpinx    LEFT   Hypertension    Lupus ([place])    Membranous glomerulonephritis    followed by Dr. Elige Radon   Ovarian fibroma    RIGHT   Pelvic adhesions    Sjogren's syndrome ([place])    Stroke [facility])     Patient Active Problem List   Diagnosis Date Noted   Acute metabolic encephalopathy [id number]   Altered mental status 11/12/2020   GERD (gastroesophageal reflux disease) 11/12/2020   Hypokalemia 12/15/2019   Chronic constipation 12/15/2019   Acute urinary retention 12/15/2019   Closed left hip fracture ([place]) 12/14/2019   Essential hypertension 01/20/2018   Acute CVA (cerebrovascular accident) ([place]) 10/16/2015   Left hand weakness 10/15/2015   Ptosis 12/22/2012   Weakness 12/22/2012   Primary hyperparathyroidism ([place]) 08/05/2012   Chest pain 03/18/2010   CONJUNCTIVITIS, ACUTE 04/26/2008   MEMBRANOUS GLOMERULONEPHRITIS 04/26/2008   SJOGREN'S SYNDROME  04/26/2008   ABDOMINAL PAIN, CHRONIC 04/26/2008   NONSPECIFIC ABNORMAL UNSPEC CV FUNCTION STUDY 04/26/2008    Past Surgical History:  Procedure Laterality Date   APPENDECTOMY  04/28/2000   FRACTURE SURGERY     HIP PINNING,CANNULATED Left 12/15/2019   Procedure: CANNULATED HIP PINNING;  Surgeon: Shona Needles, MD;  Location: [facility];  Service: Orthopedics;  Laterality: Left;   LAPAROSCOPIC CHOLECYSTECTOMY  04/28/2000   TONSILLECTOMY  04/29/1939   TOTAL ABDOMINAL HYSTERECTOMY W/ BILATERAL SALPINGOOPHORECTOMY  04/29/2003   VAGINAL HYSTERECTOMY  04/29/1979     OB History   No obstetric history on file.     Family History  Problem Relation Age of Onset   Heart disease Other    Anxiety disorder Other    Depression Other     Social History   Tobacco Use   Smoking status: Never   Smokeless tobacco: Never  Vaping Use   Vaping Use: Never used  Substance Use Topics   Alcohol use: No    Alcohol/week: 0.0 standard drinks   Drug use: Never    Home Medications Prior to Admission medications   Medication Sig Start Date End Date Taking? Authorizing Provider  ALPRAZolam Duanne Moron) 0.5 MG tablet Take 1 tablet (0.5 mg total) by mouth 3 (three) times daily as needed for anxiety. 11/19/20 12/19/20  Deatra James, MD  aspirin EC 81 MG tablet Take 81 mg by mouth daily. Swallow whole.    [provider]  carboxymethylcellulose 1 %  ophthalmic solution Apply 1 drop to eye daily as needed.    [provider]  Cholecalciferol (VITAMIN D-3 PO) Take 1 tablet by mouth daily. Patient not taking: Reported on 11/12/2020    [provider]  clopidogrel (PLAVIX) 75 MG tablet Take 1 tablet (75 mg total) by mouth daily. 11/20/20 12/20/20  ShahmehdiValeria Batman, MD  docusate sodium (COLACE) 100 MG capsule Take 1 capsule (100 mg total) by mouth 2 (two) times daily. 11/19/20   Shahmehdi, Valeria Batman, MD  ezetimibe (ZETIA) 10 MG tablet Take 1 tablet (10 mg total) by mouth daily at 6 PM.  11/19/20 12/19/20  Shahmehdi, Valeria Batman, MD  loratadine (CLARITIN) 10 MG tablet Take 10 mg by mouth daily.    [provider]  Misc. Devices (3-IN-1 BEDSIDE TOILET) MISC Patient needs 3 in 1 bedside toilet. Recent hip fracture with repair 12/17/19   Oretha Milch D, MD  Multiple Vitamin (MULTIVITAMIN) tablet Take 1 tablet by mouth daily.    [provider]  polyethylene glycol powder (GLYCOLAX/MIRALAX) 17 GM/SCOOP powder Take 17 g by mouth daily.      [provider]  simvastatin (ZOCOR) 20 MG tablet Take 1 tablet (20 mg total) by mouth daily at 6 PM. 11/19/20 12/19/20  Shahmehdi, Valeria Batman, MD  traZODone (DESYREL) 50 MG tablet Take 1 tablet (50 mg total) by mouth at bedtime. 11/19/20 12/19/20  Deatra James, MD  Vitamin E 400 units TABS Take 400 Units by mouth daily.    [provider]    Allergies    Atorvastatin, Cellcept [mycophenolate mofetil], Medrol [methylprednisolone], Mycophenolate mofetil, Penicillins, Sulfonamide derivatives, Topiramate, Verapamil, and Levofloxacin  Review of Systems   Review of Systems  Constitutional:  Positive for unexpected weight change. Negative for chills and fever.  HENT:  Negative for ear pain and sore throat.   Eyes:  Negative for pain and visual disturbance.  Respiratory:  Negative for cough and shortness of breath.   Cardiovascular:  Negative for chest pain and palpitations.  Gastrointestinal:  Positive for blood in stool, diarrhea and hematochezia. Negative for abdominal pain and vomiting.  Genitourinary:  Negative for dysuria and hematuria.  Musculoskeletal:  Negative for arthralgias and back pain.  Skin:  Negative for color change and rash.  Neurological:  Negative for seizures and syncope.  All other systems reviewed and are negative.  Physical Exam Updated Vital Signs BP 108/65   Pulse 64   Temp 97.9 F (36.6 C) (Oral)   Resp 18   Ht '5\' 1"'$  (1.549 m)   Wt 49.4 kg   SpO2 96%   BMI 20.60 kg/m   Physical  Exam Vitals and nursing note reviewed.  Constitutional:      General: She is not in acute distress.    Appearance: She is well-developed.  HENT:     Head: Normocephalic and atraumatic.  Eyes:     Conjunctiva/sclera: Conjunctivae normal.  Cardiovascular:     Rate and Rhythm: Normal rate and regular rhythm.     Heart sounds: No murmur heard. Pulmonary:     Effort: Pulmonary effort is normal. No respiratory distress.     Breath sounds: Normal breath sounds.  Abdominal:     Palpations: Abdomen is soft.     Tenderness: There is no abdominal tenderness.  Genitourinary:    Rectum: Guaiac result positive.     Comments: Blood on rectal exam Musculoskeletal:     Cervical back: Neck supple.  Skin:    General: Skin is  warm and dry.  Neurological:     Mental Status: She is alert.    ED Results / Procedures / Treatments   Labs (all labs ordered are listed, but only abnormal results are displayed) Labs Reviewed  COMPREHENSIVE METABOLIC PANEL - Abnormal; Notable for the following components:      Result Value   Sodium 132 (*)    Glucose, Bld 103 (*)    Albumin 3.2 (*)    Anion gap 2 (*)    All other components within normal limits  CBC - Abnormal; Notable for the following components:   RBC 3.21 (*)    Hemoglobin 10.4 (*)    HCT 31.0 (*)    All other components within normal limits  POC OCCULT BLOOD, ED - Abnormal; Notable for the following components:   Fecal Occult Bld POSITIVE (*)    All other components within normal limits  TYPE AND SCREEN    EKG None  Radiology No results found.  Procedures Procedures   Medications Ordered in ED Medications  pantoprazole (PROTONIX) injection 40 mg (40 mg Intravenous Given 12/15/20 1232)    ED Course  I have reviewed the triage vital signs and the nursing notes.  Pertinent labs & imaging results that were available during my care of the patient were reviewed by me and considered in my medical decision making (see chart for  details).    MDM Rules/Calculators/A&P                           Patient seen in the emergency department for evaluation of bright red blood per rectum.  Physical exam reveals bright red blood on rectal exam but is otherwise unremarkable.  Laboratory evaluation reveals an anemia to 10.4 which is decreased from thirteen 1 month ago.  Labs otherwise unremarkable.  I spoke with GI who recommends medical admission and they will round on the patient tomorrow with a likely colonoscopy on Monday.  Patient then admitted. Final Clinical Impression(s) / ED Diagnoses Final diagnoses:  None    Rx / DC Orders ED Discharge Orders     None        Ragina Fenter, MD 12/15/20 1345

## 2020-12-15 NOTE — ED Triage Notes (Addendum)
Pt. Arrived via POV. Pt. States they have been having bloody stool since last night. Pt. States they have had three occurences since last night. Pt. Denies feeling dizzy. Pt. States it is bright red blood.

## 2020-12-15 NOTE — ED Notes (Signed)
Took pt to the restroom via wheelchair.

## 2020-12-16 DIAGNOSIS — K922 Gastrointestinal hemorrhage, unspecified: Secondary | ICD-10-CM

## 2020-12-16 LAB — CBC
HCT: 27.6 % — ABNORMAL LOW (ref 36.0–46.0)
Hemoglobin: 9.2 g/dL — ABNORMAL LOW (ref 12.0–15.0)
MCH: 31.9 pg (ref 26.0–34.0)
MCHC: 33.3 g/dL (ref 30.0–36.0)
MCV: 95.8 fL (ref 80.0–100.0)
Platelets: 242 10*3/uL (ref 150–400)
RBC: 2.88 MIL/uL — ABNORMAL LOW (ref 3.87–5.11)
RDW: 11.9 % (ref 11.5–15.5)
WBC: 5 10*3/uL (ref 4.0–10.5)
nRBC: 0 % (ref 0.0–0.2)

## 2020-12-16 LAB — BASIC METABOLIC PANEL
Anion gap: 4 — ABNORMAL LOW (ref 5–15)
BUN: 9 mg/dL (ref 8–23)
CO2: 26 mmol/L (ref 22–32)
Calcium: 9.3 mg/dL (ref 8.9–10.3)
Chloride: 104 mmol/L (ref 98–111)
Creatinine, Ser: 0.7 mg/dL (ref 0.44–1.00)
GFR, Estimated: >60 mL/min (ref >60)
Glucose, Bld: 86 mg/dL (ref 70–99)
Potassium: 3.4 mmol/L — ABNORMAL LOW (ref 3.5–5.1)
Sodium: 134 mmol/L — ABNORMAL LOW (ref 135–145)

## 2020-12-16 MED ORDER — POTASSIUM CHLORIDE CRYS ER 20 MEQ PO TBCR
40.0000 meq | EXTENDED_RELEASE_TABLET | Freq: Once | ORAL | Status: AC
  Administered 2020-12-16: 40 meq via ORAL
  Filled 2020-12-16: qty 2

## 2020-12-16 MED ORDER — PEG 3350-KCL-NA BICARB-NACL 420 G PO SOLR
4000.0000 mL | Freq: Once | ORAL | Status: AC
  Administered 2020-12-16: 4000 mL via ORAL

## 2020-12-16 NOTE — H&P (View-Only) (Signed)
Maylon Peppers, M.D. Gastroenterology & Hepatology                                           Patient Name: Peggy Ramirez Account #: _0 @   MRN: [id number] Admission Date: 12/15/2020 Date of Evaluation:  12/16/2020 Time of Evaluation: 12:58 PM   Referring Physician: Barton Dubois, MD  Chief Complaint: Rectal bleeding  HPI:  This is a 87 y.o. female with history of stroke on Plavix, Sjogren's syndrome, lupus, hypertension, hyperlipidemia, chronic constipation, ovarian fibroma, membranous glomerulonephritis, who came to the hospital after presenting episodes of rectal bleeding.  Patient is in the room with her daughters.  They report that since Thursday night she presented recurrent episodes of rectal bleeding described as fresh blood and large amount.  She had never had similar symptoms but reports that she was concerned due to the amount of blood that was coming with clots.  She stated that on Thursday she had some epigastric abdominal pain that improved after she defecated but she has not presented any more episodes of abdominal pain.  She reports that she never had similar episode of rectal bleeding.  Denies any melena, nausea, vomiting, fever, chills, abdominal distention.  States that the last time she had a bowel movement was on Saturday night which was still bloody.  Notably, the patient was started on Plavix on July 2022 after presenting strokes.  She was found to have small infarcts in the right frontal lobe, as well as chronic small vessel ischemic disease.  She denies taking any NSAIDs.  In the ED, the patient was hemodynamically stable, afebrile.  Labs upon admission showed a drop in her hemoglobin down to 10.4 (baseline was 13.0), normal white blood cell count and platelets, CMP showed normal liver function tests with mild hyponatremia 132 and electrolytes and renal function within normal limits.  Rectal exam performed by the ER showed presence of fresh blood.  Labs from today  showed a drop in her hemoglobin down to 9.2 with an MCV of 95.  Last Colonoscopy: She believes he was 20 years ago but no reports are available, states that it was normal when she was advised to not have more colonoscopies unless symptomatic.  FHx: neg for any gastrointestinal/liver disease, no malignancies Social: neg smoking, alcohol or illicit drug use Surgical: no abdominal surgeries  Past Medical History: SEE CHRONIC ISSSUES: Past Medical History:  Diagnosis Date   Chronic constipation    Chronic left lower quadrant pain    Headache(784.0)    High cholesterol    Hydrosalpinx    LEFT   Hypertension    Lupus ([place])    Membranous glomerulonephritis    followed by Dr. Elige Radon   Ovarian fibroma    RIGHT   Pelvic adhesions    Sjogren's syndrome ([place])    Stroke [facility])    Past Surgical History:  Past Surgical History:  Procedure Laterality Date   APPENDECTOMY  04/28/2000   FRACTURE SURGERY     HIP PINNING,CANNULATED Left 12/15/2019   Procedure: CANNULATED HIP PINNING;  Surgeon: Shona Needles, MD;  Location: [facility];  Service: Orthopedics;  Laterality: Left;   LAPAROSCOPIC CHOLECYSTECTOMY  04/28/2000   TONSILLECTOMY  04/29/1939   TOTAL ABDOMINAL HYSTERECTOMY W/ BILATERAL SALPINGOOPHORECTOMY  04/29/2003   VAGINAL HYSTERECTOMY  04/29/1979   Family History:  Family History  Problem Relation Age of Onset  Heart disease Other    Anxiety disorder Other    Depression Other    Social History:  Social History   Tobacco Use   Smoking status: Never   Smokeless tobacco: Never  Vaping Use   Vaping Use: Never used  Substance Use Topics   Alcohol use: No    Alcohol/week: 0.0 standard drinks   Drug use: Never    Home Medications:  Prior to Admission medications   Medication Sig Start Date End Date Taking? Authorizing Provider  ALPRAZolam Duanne Moron) 0.5 MG tablet Take 1 tablet (0.5 mg total) by mouth 3 (three) times daily as needed for anxiety. Patient taking differently:  Take 0.5 mg by mouth at bedtime as needed for anxiety. 11/19/20 12/19/20 Yes Shahmehdi, Valeria Batman, MD  Ascorbic Acid (VITAMIN C) 1000 MG tablet Take 1,000 mg by mouth daily.   Yes [provider]  aspirin EC 81 MG tablet Take 81 mg by mouth daily. Swallow whole.   Yes [provider]  carboxymethylcellulose 1 % ophthalmic solution Apply 1 drop to eye daily as needed.   Yes [provider]  clopidogrel (PLAVIX) 75 MG tablet Take 1 tablet (75 mg total) by mouth daily. 11/20/20 12/20/20 Yes Shahmehdi, Valeria Batman, MD  ezetimibe (ZETIA) 10 MG tablet Take 1 tablet (10 mg total) by mouth daily at 6 PM. 11/19/20 12/19/20 Yes Shahmehdi, Seyed A, MD  HYDROCHLOROTHIAZIDE PO Take 12.5 tablets by mouth daily.   Yes [provider]  Multiple Vitamin (MULTIVITAMIN) tablet Take 1 tablet by mouth daily.   Yes [provider]  pantoprazole (PROTONIX) 40 MG tablet Take 40 mg by mouth daily.   Yes [provider]  polyethylene glycol powder (GLYCOLAX/MIRALAX) 17 GM/SCOOP powder Take 17 g by mouth daily.     Yes [provider]  simvastatin (ZOCOR) 20 MG tablet Take 1 tablet (20 mg total) by mouth daily at 6 PM. Patient taking differently: Take 20 mg by mouth daily. 11/19/20 12/19/20 Yes Shahmehdi, Valeria Batman, MD  traZODone (DESYREL) 50 MG tablet Take 1 tablet (50 mg total) by mouth at bedtime. 11/19/20 12/19/20 Yes Shahmehdi, Seyed A, MD  zinc gluconate 50 MG tablet Take 50 mg by mouth daily.   Yes [provider]  Cholecalciferol (VITAMIN D-3 PO) Take 1 tablet by mouth daily. Patient not taking: No sig reported    [provider]  diazepam (VALIUM) 5 MG tablet Take 5 mg by mouth daily as needed. Patient not taking: No sig reported 11/13/20   [provider]  docusate sodium (COLACE) 100 MG capsule Take 1 capsule (100 mg total) by mouth 2 (two) times daily. Patient not taking: No sig reported 11/19/20   Deatra James, MD  loratadine  (CLARITIN) 10 MG tablet Take 10 mg by mouth daily. Patient not taking: No sig reported    [provider]  Misc. Devices (3-IN-1 BEDSIDE TOILET) MISC Patient needs 3 in 1 bedside toilet. Recent hip fracture with repair 12/17/19   Oretha Milch D, MD  Vitamin E 400 units TABS Take 400 Units by mouth daily. Patient not taking: No sig reported    [provider]    Inpatient Medications:  Current Facility-Administered Medications:    acetaminophen (TYLENOL) tablet 650 mg, 650 mg, Oral, Q6H PRN **OR** acetaminophen (TYLENOL) suppository 650 mg, 650 mg, Rectal, Q6H PRN, Barton Dubois, MD   ALPRAZolam Duanne Moron) tablet 0.5 mg, 0.5 mg, Oral, Q12H PRN, Barton Dubois, MD, 0.5 mg at 12/15/20 2233   ezetimibe (  ZETIA) tablet 10 mg, 10 mg, Oral, q1800, Barton Dubois, MD, 10 mg at 12/15/20 1904   ondansetron (ZOFRAN) tablet 4 mg, 4 mg, Oral, Q6H PRN **OR** ondansetron (ZOFRAN) injection 4 mg, 4 mg, Intravenous, Q6H PRN, Barton Dubois, MD   pantoprazole (PROTONIX) EC tablet 40 mg, 40 mg, Oral, Daily, Barton Dubois, MD, 40 mg at 12/16/20 0920   simvastatin (ZOCOR) tablet 20 mg, 20 mg, Oral, q1800, Barton Dubois, MD, 20 mg at 12/15/20 1904   traZODone (DESYREL) tablet 50 mg, 50 mg, Oral, QHS, Barton Dubois, MD, 50 mg at 12/15/20 2233 Allergies: Atorvastatin, Cellcept [mycophenolate mofetil], Medrol [methylprednisolone], Mycophenolate mofetil, Penicillins, Sulfonamide derivatives, Topiramate, Verapamil, and Levofloxacin  Complete Review of Systems: GENERAL: negative for malaise, night sweats HEENT: No changes in hearing or vision, no nose bleeds or other nasal problems. NECK: Negative for lumps, goiter, pain and significant neck swelling RESPIRATORY: Negative for cough, wheezing CARDIOVASCULAR: Negative for chest pain, leg swelling, palpitations, orthopnea GI: SEE HPI MUSCULOSKELETAL: Negative for joint pain or swelling, back pain, and muscle pain. SKIN: Negative for lesions,  rash PSYCH: Negative for sleep disturbance, mood disorder and recent psychosocial stressors. HEMATOLOGY Negative for prolonged bleeding, bruising easily, and swollen nodes. ENDOCRINE: Negative for cold or heat intolerance, polyuria, polydipsia and goiter. NEURO: negative for tremor, gait imbalance, syncope and seizures. The remainder of the review of systems is noncontributory.  Physical Exam: BP (!) 117/57 (BP Location: Right Arm)   Pulse 68   Temp 97.9 F (36.6 C)   Resp 16   Ht _0  (1.549 m)   Wt 49.4 kg   SpO2 99%   BMI 20.60 kg/m  GENERAL: The patient is AO x3, in no acute distress. HEENT: Head is normocephalic and atraumatic. EOMI are intact. Mouth is well hydrated and without lesions. NECK: Supple. No masses LUNGS: Clear to auscultation. No presence of rhonchi/wheezing/rales. Adequate chest expansion HEART: RRR, normal s1 and s2. ABDOMEN: Soft, nontender, no guarding, no peritoneal signs, and nondistended. BS +. No masses. EXTREMITIES: Without any cyanosis, clubbing, rash, lesions or edema. NEUROLOGIC: AOx3, no focal motor deficit. SKIN: no jaundice, no rashes  Laboratory Data CBC:     Component Value Date/Time   WBC 5.0 12/16/2020 0542   RBC 2.88 (L) 12/16/2020 0542   HGB 9.2 (L) 12/16/2020 0542   HCT 27.6 (L) 12/16/2020 0542   PLT 242 12/16/2020 0542   MCV 95.8 12/16/2020 0542   MCH 31.9 12/16/2020 0542   MCHC 33.3 12/16/2020 0542   RDW 11.9 12/16/2020 0542   LYMPHSABS 0.8 11/12/2020 1615   MONOABS 0.5 11/12/2020 1615   EOSABS 0.1 11/12/2020 1615   BASOSABS 0.0 11/12/2020 1615   COAG:  Lab Results  Component Value Date   INR 1.0 11/13/2020   INR 1.0 12/15/2019   INR 1.0 12/14/2019    BMP:  BMP Latest Ref Rng & Units 12/16/2020 12/15/2020 11/19/2020  Glucose 70 - 99 mg/dL 86 103(H) -  BUN 8 - 23 mg/dL 9 12 -  Creatinine 0.44 - 1.00 mg/dL 0.70 0.65 0.67  Sodium 135 - 145 mmol/L 134(L) 132(L) -  Potassium 3.5 - 5.1 mmol/L 3.4(L) 3.9 -  Chloride 98 -  111 mmol/L 104 100 -  CO2 22 - 32 mmol/L 26 30 -  Calcium 8.9 - 10.3 mg/dL 9.3 10.0 -    HEPATIC:  Hepatic Function Latest Ref Rng & Units 12/15/2020 11/14/2020 11/13/2020  Total Protein 6.5 - 8.1 g/dL 6.8 - 6.5  Albumin 3.5 - 5.0 g/dL 3.2(L)  3.6 3.7  AST 15 - 41 U/L 29 - 23  ALT 0 - 44 U/L 27 - 17  Alk Phosphatase 38 - 126 U/L 64 - 54  Total Bilirubin 0.3 - 1.2 mg/dL 0.4 - 0.9    CARDIAC:  Lab Results  Component Value Date   CKTOTAL 59 12/22/2012   TROPONINI <0.03 10/15/2015     Imaging: I personally reviewed and interpreted the available imaging.  Assessment & Plan: Peggy Ramirez is a 87 y.o. female with history of stroke on Plavix, Sjogren's syndrome, lupus, hypertension, hyperlipidemia, chronic constipation, ovarian fibroma, membranous glomerulonephritis, who came to the hospital after presenting episodes of rectal bleeding.  Patient is presenting lower gastrointestinal bleeding with significant drop in her hemoglobin.  I had a thorough discussion with the patient and her daughters, I explained to them that her symptoms are possibly related to diverticular bleeding versus hemorrhoidal bleeding, less likely due to ischemic colitis as she presented just transient epigastric pain.  We will need to proceed with colonoscopy tomorrow, will hold Plavix in the meantime.  They understood and agreed.  - Repeat CBC in AM, transfuse if Hb <7 - 2 large bore IV lines - Active T/S - Hold Plavix - Avoid NSAIDs - Will proceed with colonoscopy tomorrow, start bowel prep and liquid diet today at 6 PM, please keep NPO after MN  Harvel Quale, MD Gastroenterology and Hepatology [facility]

## 2020-12-16 NOTE — Progress Notes (Signed)
PROGRESS NOTE    Adelene Peggy Ramirez  [id number] DOB: [birth year] DOA: 12/15/2020 PCP: Manon Hilding, MD   Chief Complaint  Patient presents with   Rectal Bleeding    Brief admission narrative:  Peggy Ramirez is a 87 y.o. female with medical history significant of chronic constipation, hyperlipidemia, hypertension, recent hospitalization for CVA (July 2022) when she was started on dual antiplatelet therapy for 30 days; who presented to the hospital secondary to bright red blood per rectum.  Patient expressed symptoms present for the last 3 days prior to admission.  No associated abdominal pain, nausea, vomiting, sick contacts, fever or unusual food intake.  Patient also denies chest pain, shortness of breath, dysuria, hematuria, blurred vision, headaches or any other complaints. Hemodynamically stable NAD;  ED Course: Hemoglobin demonstrating drop from 13 approximately 48-monthago down to 10.1; rest of her blood work unremarkable.  Positive fecal occult blood test.  Case discussed with gastroenterology service who is recommending holding aspirin and Plavix, full liquid diet and hospital admission; they will see patient on 12/16/2020 and might offered her colonoscopy evaluation on 12/17/20.  Large-bore IVs placement and type and screen done while in the ED.  Assessment & Plan: 1-acute blood loss anemia/lower GI bleed -Hemoglobin currently 9.2 (anticipating some hemodilution after gentle fluid resuscitation overnight) -No significant overt bleeding appreciated -Hemodynamically stable currently -Continue holding NSAIDs and Plavix; no heparin products -Continue to follow hemoglobin trend with transfusion for hemoglobin less than 7.5 -After discussing with gastroenterology service plan is for colonoscopy evaluation tomorrow morning (12/17/20) -Patient will receive bowel prep this afternoon. -Continue full liquid diet and n.p.o. after midnight.  2-essential hypertension -Is stable and  well-controlled -Continue current antihypertensive regimen.  3-hyperlipidemia -Continue Zetia and Zocor  4-history of CVA -Continue risk factor modification -No new deficits appreciated -Holding Plavix in the setting of acute bleeding.  5-anxiety/depression -Continue the use of as needed Xanax and nightly trazodone -Mood stable.  6-GERD -continue PPI  DVT prophylaxis: SCDs Code Status: Full code. Family Communication: Both daughters updated at bedside 12/16/20 Disposition:   Status is: Observation  The patient remains OBS appropriate and will d/c before 2 midnights.  Dispo: The patient is from: Home              Anticipated d/c is to: Home              Patient currently is not medically stable to d/c.   Difficult to place patient No    Consultants:  Gastroenterology service.  Procedures:  Colonoscopy planned for 12/17/20  Antimicrobials:  None   Subjective: Afebrile, no chest pain, no nausea, no vomiting.  Denies abdominal pain.  No significant bleeding reported overnight.  Objective: Vitals:   12/16/20 0243 12/16/20 0415 12/16/20 0841 12/16/20 0842  BP: (!) 103/49 (!) 112/54 (!) 154/133 (!) 117/57  Pulse: 61 68 73 68  Resp: '17 17 16 16  '$ Temp: 98.2 F (36.8 C) 98.1 F (36.7 C) 97.9 F (36.6 C) 97.9 F (36.6 C)  TempSrc: Oral     SpO2: 98% 98% 100% 99%  Weight:      Height:        Intake/Output Summary (Last 24 hours) at 12/16/2020 1333 Last data filed at 12/16/2020 0900 Gross per 24 hour  Intake 986.95 ml  Output 600 ml  Net 386.95 ml   Filed Weights   12/15/20 1143  Weight: 49.4 kg    Examination: General exam: Appears calm and comfortable, in no acute distress.  Reports no chest pain, no nausea, no vomiting, no abdominal pain. Respiratory system: Clear to auscultation. Respiratory effort normal.  Good air movement bilaterally. Cardiovascular system: S1 and S2 appreciated; regular rate and rhythm.  No rubs or gallops Gastrointestinal system:  Abdomen is nondistended, soft and nontender. No organomegaly or masses felt. Normal bowel sounds heard. Central nervous system: Alert and oriented. No focal neurological deficits. Extremities: No cyanosis or clubbing. Skin: No petechiae. Psychiatry: Mood & affect appropriate.     Data Reviewed: I have personally reviewed following labs and imaging studies  CBC: Recent Labs  Lab 12/15/20 1205 12/16/20 0542  WBC 4.9 5.0  HGB 10.4* 9.2*  HCT 31.0* 27.6*  MCV 96.6 95.8  PLT 266 [id number]    Basic Metabolic Panel: Recent Labs  Lab 12/15/20 1205 12/16/20 0542  NA 132* 134*  K 3.9 3.4*  CL 100 104  CO2 30 26  GLUCOSE 103* 86  BUN 12 9  CREATININE 0.65 0.70  CALCIUM 10.0 9.3    GFR: Estimated Creatinine Clearance: 38.8 mL/min (by C-G formula based on SCr of 0.7 mg/dL).  Liver Function Tests: Recent Labs  Lab 12/15/20 1205  AST 29  ALT 27  ALKPHOS 64  BILITOT 0.4  PROT 6.8  ALBUMIN 3.2*    CBG: No results for input(s): GLUCAP in the last 168 hours.   Recent Results (from the past 240 hour(s))  Resp Panel by RT-PCR (Flu A&B, Covid) Nasopharyngeal Swab     Status: None   Collection Time: 12/15/20  7:11 PM   Specimen: Nasopharyngeal Swab; Nasopharyngeal(NP) swabs in vial transport medium  Result Value Ref Range Status   SARS Coronavirus 2 by RT PCR NEGATIVE NEGATIVE Final    Comment: (NOTE) SARS-CoV-2 target nucleic acids are NOT DETECTED.  The SARS-CoV-2 RNA is generally detectable in upper respiratory specimens during the acute phase of infection. The lowest concentration of SARS-CoV-2 viral copies this assay can detect is 138 copies/mL. A negative result does not preclude SARS-Cov-2 infection and should not be used as the sole basis for treatment or other patient management decisions. A negative result may occur with  improper specimen collection/handling, submission of specimen other than nasopharyngeal swab, presence of viral mutation(s) within the areas  targeted by this assay, and inadequate number of viral copies(<138 copies/mL). A negative result must be combined with clinical observations, patient history, and epidemiological information. The expected result is Negative.  Fact Sheet for Patients:  EntrepreneurPulse.com.au  Fact Sheet for Healthcare Providers:  IncredibleEmployment.be  This test is no t yet approved or cleared by the [place] FDA and  has been authorized for detection and/or diagnosis of SARS-CoV-2 by FDA under an Emergency Use Authorization (EUA). This EUA will remain  in effect (meaning this test can be used) for the duration of the COVID-19 declaration under Section 564(b)(1) of the Act, 21 U.S.C.section 360bbb-3(b)(1), unless the authorization is terminated  or revoked sooner.       Influenza A by PCR NEGATIVE NEGATIVE Final   Influenza B by PCR NEGATIVE NEGATIVE Final    Comment: (NOTE) The Xpert Xpress SARS-CoV-2/FLU/RSV plus assay is intended as an aid in the diagnosis of influenza from Nasopharyngeal swab specimens and should not be used as a sole basis for treatment. Nasal washings and aspirates are unacceptable for Xpert Xpress SARS-CoV-2/FLU/RSV testing.  Fact Sheet for Patients: EntrepreneurPulse.com.au  Fact Sheet for Healthcare Providers: IncredibleEmployment.be  This test is not yet approved or cleared by the [place] FDA and has been  authorized for detection and/or diagnosis of SARS-CoV-2 by FDA under an Emergency Use Authorization (EUA). This EUA will remain in effect (meaning this test can be used) for the duration of the COVID-19 declaration under Section 564(b)(1) of the Act, 21 U.S.C. section 360bbb-3(b)(1), unless the authorization is terminated or revoked.  Performed at [facility], [address], [place], [place] [address]       Radiology Studies: No results found.   Scheduled Meds:   ezetimibe  10 mg Oral q1800   pantoprazole  40 mg Oral Daily   simvastatin  20 mg Oral q1800   traZODone  50 mg Oral QHS   Continuous Infusions:   LOS: 0 days    Time spent: 35 minutes   Barton Dubois, MD [facility]   To contact the attending provider between 7A-7P or the covering provider during after hours 7P-7A, please log into the web site [email or web address] and access using universal [place] password for that web site. If you do not have the password, please call the hospital operator.  12/16/2020, 1:33 PM

## 2020-12-16 NOTE — Consult Note (Signed)
Maylon Peppers, M.D. Gastroenterology & Hepatology                                           Patient Name: Peggy Ramirez Account #: _0 @   MRN: [id number] Admission Date: 12/15/2020 Date of Evaluation:  12/16/2020 Time of Evaluation: 12:58 PM   Referring Physician: Barton Dubois, MD  Chief Complaint: Rectal bleeding  HPI:  This is a 87 y.o. female with history of stroke on Plavix, Sjogren's syndrome, lupus, hypertension, hyperlipidemia, chronic constipation, ovarian fibroma, membranous glomerulonephritis, who came to the hospital after presenting episodes of rectal bleeding.  Patient is in the room with her daughters.  They report that since Thursday night she presented recurrent episodes of rectal bleeding described as fresh blood and large amount.  She had never had similar symptoms but reports that she was concerned due to the amount of blood that was coming with clots.  She stated that on Thursday she had some epigastric abdominal pain that improved after she defecated but she has not presented any more episodes of abdominal pain.  She reports that she never had similar episode of rectal bleeding.  Denies any melena, nausea, vomiting, fever, chills, abdominal distention.  States that the last time she had a bowel movement was on Saturday night which was still bloody.  Notably, the patient was started on Plavix on July 2022 after presenting strokes.  She was found to have small infarcts in the right frontal lobe, as well as chronic small vessel ischemic disease.  She denies taking any NSAIDs.  In the ED, the patient was hemodynamically stable, afebrile.  Labs upon admission showed a drop in her hemoglobin down to 10.4 (baseline was 13.0), normal white blood cell count and platelets, CMP showed normal liver function tests with mild hyponatremia 132 and electrolytes and renal function within normal limits.  Rectal exam performed by the ER showed presence of fresh blood.  Labs from today  showed a drop in her hemoglobin down to 9.2 with an MCV of 95.  Last Colonoscopy: She believes he was 20 years ago but no reports are available, states that it was normal when she was advised to not have more colonoscopies unless symptomatic.  FHx: neg for any gastrointestinal/liver disease, no malignancies Social: neg smoking, alcohol or illicit drug use Surgical: no abdominal surgeries  Past Medical History: SEE CHRONIC ISSSUES: Past Medical History:  Diagnosis Date   Chronic constipation    Chronic left lower quadrant pain    Headache(784.0)    High cholesterol    Hydrosalpinx    LEFT   Hypertension    Lupus ([place])    Membranous glomerulonephritis    followed by Dr. Elige Radon   Ovarian fibroma    RIGHT   Pelvic adhesions    Sjogren's syndrome ([place])    Stroke [facility])    Past Surgical History:  Past Surgical History:  Procedure Laterality Date   APPENDECTOMY  04/28/2000   FRACTURE SURGERY     HIP PINNING,CANNULATED Left 12/15/2019   Procedure: CANNULATED HIP PINNING;  Surgeon: Shona Needles, MD;  Location: [place];  Service: Orthopedics;  Laterality: Left;   LAPAROSCOPIC CHOLECYSTECTOMY  04/28/2000   TONSILLECTOMY  04/29/1939   TOTAL ABDOMINAL HYSTERECTOMY W/ BILATERAL SALPINGOOPHORECTOMY  04/29/2003   VAGINAL HYSTERECTOMY  04/29/1979   Family History:  Family History  Problem Relation Age of Onset  Heart disease Other    Anxiety disorder Other    Depression Other    Social History:  Social History   Tobacco Use   Smoking status: Never   Smokeless tobacco: Never  Vaping Use   Vaping Use: Never used  Substance Use Topics   Alcohol use: No    Alcohol/week: 0.0 standard drinks   Drug use: Never    Home Medications:  Prior to Admission medications   Medication Sig Start Date End Date Taking? Authorizing Provider  ALPRAZolam Duanne Moron) 0.5 MG tablet Take 1 tablet (0.5 mg total) by mouth 3 (three) times daily as needed for anxiety. Patient taking differently:  Take 0.5 mg by mouth at bedtime as needed for anxiety. 11/19/20 12/19/20 Yes Shahmehdi, Valeria Batman, MD  Ascorbic Acid (VITAMIN C) 1000 MG tablet Take 1,000 mg by mouth daily.   Yes [provider]  aspirin EC 81 MG tablet Take 81 mg by mouth daily. Swallow whole.   Yes [provider]  carboxymethylcellulose 1 % ophthalmic solution Apply 1 drop to eye daily as needed.   Yes [provider]  clopidogrel (PLAVIX) 75 MG tablet Take 1 tablet (75 mg total) by mouth daily. 11/20/20 12/20/20 Yes Shahmehdi, Valeria Batman, MD  ezetimibe (ZETIA) 10 MG tablet Take 1 tablet (10 mg total) by mouth daily at 6 PM. 11/19/20 12/19/20 Yes Shahmehdi, Seyed A, MD  HYDROCHLOROTHIAZIDE PO Take 12.5 tablets by mouth daily.   Yes [provider]  Multiple Vitamin (MULTIVITAMIN) tablet Take 1 tablet by mouth daily.   Yes [provider]  pantoprazole (PROTONIX) 40 MG tablet Take 40 mg by mouth daily.   Yes [provider]  polyethylene glycol powder (GLYCOLAX/MIRALAX) 17 GM/SCOOP powder Take 17 g by mouth daily.     Yes [provider]  simvastatin (ZOCOR) 20 MG tablet Take 1 tablet (20 mg total) by mouth daily at 6 PM. Patient taking differently: Take 20 mg by mouth daily. 11/19/20 12/19/20 Yes Shahmehdi, Valeria Batman, MD  traZODone (DESYREL) 50 MG tablet Take 1 tablet (50 mg total) by mouth at bedtime. 11/19/20 12/19/20 Yes Shahmehdi, Seyed A, MD  zinc gluconate 50 MG tablet Take 50 mg by mouth daily.   Yes [provider]  Cholecalciferol (VITAMIN D-3 PO) Take 1 tablet by mouth daily. Patient not taking: No sig reported    [provider]  diazepam (VALIUM) 5 MG tablet Take 5 mg by mouth daily as needed. Patient not taking: No sig reported 11/13/20   [provider]  docusate sodium (COLACE) 100 MG capsule Take 1 capsule (100 mg total) by mouth 2 (two) times daily. Patient not taking: No sig reported 11/19/20   Deatra James, MD  loratadine  (CLARITIN) 10 MG tablet Take 10 mg by mouth daily. Patient not taking: No sig reported    [provider]  Misc. Devices (3-IN-1 BEDSIDE TOILET) MISC Patient needs 3 in 1 bedside toilet. Recent hip fracture with repair 12/17/19   Oretha Milch D, MD  Vitamin E 400 units TABS Take 400 Units by mouth daily. Patient not taking: No sig reported    [provider]    Inpatient Medications:  Current Facility-Administered Medications:    acetaminophen (TYLENOL) tablet 650 mg, 650 mg, Oral, Q6H PRN **OR** acetaminophen (TYLENOL) suppository 650 mg, 650 mg, Rectal, Q6H PRN, Barton Dubois, MD   ALPRAZolam Duanne Moron) tablet 0.5 mg, 0.5 mg, Oral, Q12H PRN, Barton Dubois, MD, 0.5 mg at 12/15/20 2233   ezetimibe (  ZETIA) tablet 10 mg, 10 mg, Oral, q1800, Barton Dubois, MD, 10 mg at 12/15/20 1904   ondansetron (ZOFRAN) tablet 4 mg, 4 mg, Oral, Q6H PRN **OR** ondansetron (ZOFRAN) injection 4 mg, 4 mg, Intravenous, Q6H PRN, Barton Dubois, MD   pantoprazole (PROTONIX) EC tablet 40 mg, 40 mg, Oral, Daily, Barton Dubois, MD, 40 mg at 12/16/20 0920   simvastatin (ZOCOR) tablet 20 mg, 20 mg, Oral, q1800, Barton Dubois, MD, 20 mg at 12/15/20 1904   traZODone (DESYREL) tablet 50 mg, 50 mg, Oral, QHS, Barton Dubois, MD, 50 mg at 12/15/20 2233 Allergies: Atorvastatin, Cellcept [mycophenolate mofetil], Medrol [methylprednisolone], Mycophenolate mofetil, Penicillins, Sulfonamide derivatives, Topiramate, Verapamil, and Levofloxacin  Complete Review of Systems: GENERAL: negative for malaise, night sweats HEENT: No changes in hearing or vision, no nose bleeds or other nasal problems. NECK: Negative for lumps, goiter, pain and significant neck swelling RESPIRATORY: Negative for cough, wheezing CARDIOVASCULAR: Negative for chest pain, leg swelling, palpitations, orthopnea GI: SEE HPI MUSCULOSKELETAL: Negative for joint pain or swelling, back pain, and muscle pain. SKIN: Negative for lesions,  rash PSYCH: Negative for sleep disturbance, mood disorder and recent psychosocial stressors. HEMATOLOGY Negative for prolonged bleeding, bruising easily, and swollen nodes. ENDOCRINE: Negative for cold or heat intolerance, polyuria, polydipsia and goiter. NEURO: negative for tremor, gait imbalance, syncope and seizures. The remainder of the review of systems is noncontributory.  Physical Exam: BP (!) 117/57 (BP Location: Right Arm)   Pulse 68   Temp 97.9 F (36.6 C)   Resp 16   Ht _0  (1.549 m)   Wt 49.4 kg   SpO2 99%   BMI 20.60 kg/m  GENERAL: The patient is AO x3, in no acute distress. HEENT: Head is normocephalic and atraumatic. EOMI are intact. Mouth is well hydrated and without lesions. NECK: Supple. No masses LUNGS: Clear to auscultation. No presence of rhonchi/wheezing/rales. Adequate chest expansion HEART: RRR, normal s1 and s2. ABDOMEN: Soft, nontender, no guarding, no peritoneal signs, and nondistended. BS +. No masses. EXTREMITIES: Without any cyanosis, clubbing, rash, lesions or edema. NEUROLOGIC: AOx3, no focal motor deficit. SKIN: no jaundice, no rashes  Laboratory Data CBC:     Component Value Date/Time   WBC 5.0 12/16/2020 0542   RBC 2.88 (L) 12/16/2020 0542   HGB 9.2 (L) 12/16/2020 0542   HCT 27.6 (L) 12/16/2020 0542   PLT 242 12/16/2020 0542   MCV 95.8 12/16/2020 0542   MCH 31.9 12/16/2020 0542   MCHC 33.3 12/16/2020 0542   RDW 11.9 12/16/2020 0542   LYMPHSABS 0.8 11/12/2020 1615   MONOABS 0.5 11/12/2020 1615   EOSABS 0.1 11/12/2020 1615   BASOSABS 0.0 11/12/2020 1615   COAG:  Lab Results  Component Value Date   INR 1.0 11/13/2020   INR 1.0 12/15/2019   INR 1.0 12/14/2019    BMP:  BMP Latest Ref Rng & Units 12/16/2020 12/15/2020 11/19/2020  Glucose 70 - 99 mg/dL 86 103(H) -  BUN 8 - 23 mg/dL 9 12 -  Creatinine 0.44 - 1.00 mg/dL 0.70 0.65 0.67  Sodium 135 - 145 mmol/L 134(L) 132(L) -  Potassium 3.5 - 5.1 mmol/L 3.4(L) 3.9 -  Chloride 98 -  111 mmol/L 104 100 -  CO2 22 - 32 mmol/L 26 30 -  Calcium 8.9 - 10.3 mg/dL 9.3 10.0 -    HEPATIC:  Hepatic Function Latest Ref Rng & Units 12/15/2020 11/14/2020 11/13/2020  Total Protein 6.5 - 8.1 g/dL 6.8 - 6.5  Albumin 3.5 - 5.0 g/dL 3.2(L)  3.6 3.7  AST 15 - 41 U/L 29 - 23  ALT 0 - 44 U/L 27 - 17  Alk Phosphatase 38 - 126 U/L 64 - 54  Total Bilirubin 0.3 - 1.2 mg/dL 0.4 - 0.9    CARDIAC:  Lab Results  Component Value Date   CKTOTAL 59 12/22/2012   TROPONINI <0.03 10/15/2015     Imaging: I personally reviewed and interpreted the available imaging.  Assessment & Plan: Peggy Ramirez is a 87 y.o. female with history of stroke on Plavix, Sjogren's syndrome, lupus, hypertension, hyperlipidemia, chronic constipation, ovarian fibroma, membranous glomerulonephritis, who came to the hospital after presenting episodes of rectal bleeding.  Patient is presenting lower gastrointestinal bleeding with significant drop in her hemoglobin.  I had a thorough discussion with the patient and her daughters, I explained to them that her symptoms are possibly related to diverticular bleeding versus hemorrhoidal bleeding, less likely due to ischemic colitis as she presented just transient epigastric pain.  We will need to proceed with colonoscopy tomorrow, will hold Plavix in the meantime.  They understood and agreed.  - Repeat CBC in AM, transfuse if Hb <7 - 2 large bore IV lines - Active T/S - Hold Plavix - Avoid NSAIDs - Will proceed with colonoscopy tomorrow, start bowel prep and liquid diet today at 6 PM, please keep NPO after MN  Harvel Quale, MD Gastroenterology and Hepatology [facility]

## 2020-12-16 NOTE — Care Management Obs Status (Signed)
[place] NOTIFICATION   Patient Details  Name: Peggy Ramirez MRN: [id number] Date of Birth: 1933-10-18   Medicare Observation Status Notification Given:  Yes    Natasha Bence, LCSW 12/16/2020, 6:16 PM

## 2020-12-17 ENCOUNTER — Other Ambulatory Visit: Payer: Self-pay

## 2020-12-17 ENCOUNTER — Inpatient Hospital Stay (HOSPITAL_COMMUNITY): Payer: Medicare Other | Admitting: Anesthesiology

## 2020-12-17 ENCOUNTER — Encounter (HOSPITAL_COMMUNITY): Payer: Self-pay | Admitting: Internal Medicine

## 2020-12-17 ENCOUNTER — Encounter (HOSPITAL_COMMUNITY)
Admission: EM | Disposition: A | Discharge status: Home / Self Care | Payer: Self-pay | Source: Home / Self Care | Attending: Internal Medicine

## 2020-12-17 DIAGNOSIS — K219 Gastro-esophageal reflux disease without esophagitis: Secondary | ICD-10-CM | POA: Diagnosis present

## 2020-12-17 DIAGNOSIS — E7800 Pure hypercholesterolemia, unspecified: Secondary | ICD-10-CM | POA: Diagnosis present

## 2020-12-17 DIAGNOSIS — M3500 Sicca syndrome, unspecified: Secondary | ICD-10-CM | POA: Diagnosis present

## 2020-12-17 DIAGNOSIS — Z7982 Long term (current) use of aspirin: Secondary | ICD-10-CM | POA: Diagnosis not present

## 2020-12-17 DIAGNOSIS — R634 Abnormal weight loss: Secondary | ICD-10-CM | POA: Diagnosis present

## 2020-12-17 DIAGNOSIS — Z880 Allergy status to penicillin: Secondary | ICD-10-CM | POA: Diagnosis not present

## 2020-12-17 DIAGNOSIS — Z888 Allergy status to other drugs, medicaments and biological substances status: Secondary | ICD-10-CM | POA: Diagnosis not present

## 2020-12-17 DIAGNOSIS — Z20822 Contact with and (suspected) exposure to covid-19: Secondary | ICD-10-CM | POA: Diagnosis present

## 2020-12-17 DIAGNOSIS — Z90710 Acquired absence of both cervix and uterus: Secondary | ICD-10-CM | POA: Diagnosis not present

## 2020-12-17 DIAGNOSIS — Z8673 Personal history of transient ischemic attack (TIA), and cerebral infarction without residual deficits: Secondary | ICD-10-CM | POA: Diagnosis not present

## 2020-12-17 DIAGNOSIS — Z881 Allergy status to other antibiotic agents status: Secondary | ICD-10-CM | POA: Diagnosis not present

## 2020-12-17 DIAGNOSIS — Z882 Allergy status to sulfonamides status: Secondary | ICD-10-CM | POA: Diagnosis not present

## 2020-12-17 DIAGNOSIS — K5731 Diverticulosis of large intestine without perforation or abscess with bleeding: Secondary | ICD-10-CM | POA: Diagnosis present

## 2020-12-17 DIAGNOSIS — Z79899 Other long term (current) drug therapy: Secondary | ICD-10-CM | POA: Diagnosis not present

## 2020-12-17 DIAGNOSIS — K921 Melena: Secondary | ICD-10-CM

## 2020-12-17 DIAGNOSIS — F419 Anxiety disorder, unspecified: Secondary | ICD-10-CM | POA: Diagnosis present

## 2020-12-17 DIAGNOSIS — I10 Essential (primary) hypertension: Secondary | ICD-10-CM | POA: Diagnosis present

## 2020-12-17 DIAGNOSIS — Z7902 Long term (current) use of antithrombotics/antiplatelets: Secondary | ICD-10-CM | POA: Diagnosis not present

## 2020-12-17 DIAGNOSIS — K635 Polyp of colon: Secondary | ICD-10-CM | POA: Diagnosis present

## 2020-12-17 DIAGNOSIS — Z9049 Acquired absence of other specified parts of digestive tract: Secondary | ICD-10-CM | POA: Diagnosis not present

## 2020-12-17 DIAGNOSIS — Z90722 Acquired absence of ovaries, bilateral: Secondary | ICD-10-CM | POA: Diagnosis not present

## 2020-12-17 DIAGNOSIS — K922 Gastrointestinal hemorrhage, unspecified: Secondary | ICD-10-CM | POA: Diagnosis present

## 2020-12-17 DIAGNOSIS — F32A Depression, unspecified: Secondary | ICD-10-CM | POA: Diagnosis present

## 2020-12-17 DIAGNOSIS — D62 Acute posthemorrhagic anemia: Secondary | ICD-10-CM | POA: Diagnosis present

## 2020-12-17 DIAGNOSIS — K5909 Other constipation: Secondary | ICD-10-CM | POA: Diagnosis present

## 2020-12-17 DIAGNOSIS — K648 Other hemorrhoids: Secondary | ICD-10-CM | POA: Diagnosis present

## 2020-12-17 HISTORY — PX: COLONOSCOPY WITH PROPOFOL: SHX5780

## 2020-12-17 HISTORY — PX: BIOPSY: SHX5522

## 2020-12-17 LAB — BASIC METABOLIC PANEL
Anion gap: 5 (ref 5–15)
BUN: 8 mg/dL (ref 8–23)
CO2: 28 mmol/L (ref 22–32)
Calcium: 9.9 mg/dL (ref 8.9–10.3)
Chloride: 105 mmol/L (ref 98–111)
Creatinine, Ser: 0.77 mg/dL (ref 0.44–1.00)
GFR, Estimated: >60 mL/min (ref >60)
Glucose, Bld: 91 mg/dL (ref 70–99)
Potassium: 4.3 mmol/L (ref 3.5–5.1)
Sodium: 138 mmol/L (ref 135–145)

## 2020-12-17 LAB — MAGNESIUM: Magnesium: 2.2 mg/dL (ref 1.7–2.4)

## 2020-12-17 LAB — CBC
HCT: 29.2 % — ABNORMAL LOW (ref 36.0–46.0)
Hemoglobin: 9.4 g/dL — ABNORMAL LOW (ref 12.0–15.0)
MCH: 31.5 pg (ref 26.0–34.0)
MCHC: 32.2 g/dL (ref 30.0–36.0)
MCV: 98 fL (ref 80.0–100.0)
Platelets: 285 10*3/uL (ref 150–400)
RBC: 2.98 MIL/uL — ABNORMAL LOW (ref 3.87–5.11)
RDW: 11.9 % (ref 11.5–15.5)
WBC: 4.9 10*3/uL (ref 4.0–10.5)
nRBC: 0 % (ref 0.0–0.2)

## 2020-12-17 SURGERY — COLONOSCOPY WITH PROPOFOL
Anesthesia: General

## 2020-12-17 MED ORDER — CLOPIDOGREL BISULFATE 75 MG PO TABS: 75.0000 mg | ORAL_TABLET | Freq: Every day | ORAL | 1 refills | Status: DC

## 2020-12-17 MED ORDER — POLYETHYLENE GLYCOL 3350 17 GM/SCOOP PO POWD: 17.0000 g | Freq: Every day | ORAL | 0 refills | Status: DC | PRN

## 2020-12-17 MED ORDER — SODIUM CHLORIDE 0.9 % IV SOLN: INTRAVENOUS | Status: DC

## 2020-12-17 MED ORDER — PROPOFOL 10 MG/ML IV BOLUS
INTRAVENOUS | Status: DC | PRN
  Administered 2020-12-17: 70 mg via INTRAVENOUS

## 2020-12-17 MED ORDER — LACTATED RINGERS IV SOLN: INTRAVENOUS | Status: DC

## 2020-12-17 MED ORDER — PROPOFOL 500 MG/50ML IV EMUL
INTRAVENOUS | Status: DC | PRN
  Administered 2020-12-17: 100 ug/kg/min via INTRAVENOUS

## 2020-12-17 NOTE — Op Note (Signed)
[facility] Patient Name: Peggy Ramirez Procedure Date: 12/17/2020 11:48 AM MRN: [id number] Date of Birth: January 05, 1934 Attending MD: Elon Alas. Abbey Chatters DO CSN: [id number] Age: 87 Admit Type: Outpatient Procedure:                Colonoscopy Indications:              Hematochezia, Acute post hemorrhagic anemia Providers:                Elon Alas. Abbey Chatters, DO, Janeece Riggers, RN, Aram Candela Referring MD:              Medicines:                See the Anesthesia note for documentation of the                            administered medications Complications:            No immediate complications. Estimated Blood Loss:     Estimated blood loss was minimal. Procedure:                Pre-Anesthesia Assessment:                           - The anesthesia plan was to use monitored                            anesthesia care (MAC).                           After obtaining informed consent, the colonoscope                            was passed under direct vision. Throughout the                            procedure, the patient's blood pressure, pulse, and                            oxygen saturations were monitored continuously. The                            [id number] ([id number]) was introduced through the                            anus and advanced to the the cecum, identified by                            appendiceal orifice and ileocecal valve. The                            colonoscopy was performed without difficulty. The                            patient tolerated the procedure well. The quality                            of  the bowel preparation was evaluated using the                            BBPS [facility] Bowel Preparation Scale) with scores                            of: Right Colon = 3, Transverse Colon = 3 and Left                            Colon = 3 (entire mucosa seen well with no residual                            staining, small fragments of stool or opaque                             liquid). The total BBPS score equals 9. Scope In: 25:36:64 PM Scope Out: 1:00:47 PM Scope Withdrawal Time: 0 hours 9 minutes 11 seconds  Total Procedure Duration: 0 hours 14 minutes 13 seconds  Findings:      The perianal and digital rectal examinations were normal.      Non-bleeding internal hemorrhoids were found during retroflexion.      Many small and large-mouthed diverticula were found in the sigmoid colon       and descending colon.      A few small-mouthed diverticula were found in the transverse colon.      A 2 mm polyp was found in the sigmoid colon. The polyp was sessile. The       polyp was removed with a cold biopsy forceps. Resection and retrieval       were complete.      Localized mild inflammation characterized by erythema was found in the       sigmoid colon at approx 17 cm from anal verge. Biopsies were taken with       a cold forceps for histology. Suspect focal colitis vs prep artifact.      There is no endoscopic evidence of bleeding in the entire colon. Impression:               - Non-bleeding internal hemorrhoids.                           - Diverticulosis in the sigmoid colon and in the                            descending colon.                           - Diverticulosis in the transverse colon.                           - One 2 mm polyp in the sigmoid colon, removed with                            a cold biopsy forceps. Resected and retrieved.                           -  Localized mild inflammation was found in the                            sigmoid colon. Biopsied. Moderate Sedation:      Per Anesthesia Care Recommendation:           - Return patient to hospital ward for ongoing care.                           - Resume regular diet.                           - Continue present medications.                           - Await pathology results.                           - No repeat colonoscopy due to age.                           - Okay to  resume Plavix tomorrow.                           - Etiology of patient's lower GI bleeding likely                            diverticular or hemorrhoidal. Focal area of                            inflammation in sigmoid colon, query prep artifact                            versus focal colitis. Procedure Code(s):        --- Professional ---                           [phone], Colonoscopy, flexible; with biopsy, single                            or multiple Diagnosis Code(s):        --- Professional ---                           K64.8, Other hemorrhoids                           K63.5, Polyp of colon                           K52.9, Noninfective gastroenteritis and colitis,                            unspecified                           K92.1, Melena (includes Hematochezia)  D62, Acute posthemorrhagic anemia                           K57.30, Diverticulosis of large intestine without                            perforation or abscess without bleeding CPT copyright 2019 American Medical Association. All rights reserved. The codes documented in this report are preliminary and upon coder review may  be revised to meet current compliance requirements. Elon Alas. Abbey Chatters, DO [place] Abbey Chatters, DO 12/17/2020 1:10:37 PM This report has been signed electronically. Number of Addenda: 0

## 2020-12-17 NOTE — Transfer of Care (Signed)
Immediate Anesthesia Transfer of Care Note  Patient: Caoimhe S Dierks  Procedure(s) Performed: COLONOSCOPY WITH PROPOFOL BIOPSY  Patient Location: PACU  Anesthesia Type:General  Level of Consciousness: awake, alert  and oriented  Airway & Oxygen Therapy: Patient Spontanous Breathing and Patient connected to nasal cannula oxygen  Post-op Assessment: Report given to RN and Post -op Vital signs reviewed and stable  Post vital signs: Reviewed and stable  Last Vitals:  Vitals Value Taken Time  BP 128/52 12/17/20 1304  Temp    Pulse 40 12/17/20 1305  Resp 10 12/17/20 1305  SpO2 84 % 12/17/20 1305  Vitals shown include unvalidated device data.  Last Pain:  Vitals:   12/17/20 1124  TempSrc: Oral  PainSc: 0-No pain         Complications: No notable events documented.

## 2020-12-17 NOTE — Discharge Summary (Signed)
Physician Discharge Summary  Peggy Ramirez [id number] DOB: 1934/02/13 DOA: 12/15/2020  PCP: Manon Hilding, MD  Admit date: 12/15/2020 Discharge date: 12/17/2020  Time spent: 35 minutes  Recommendations for Outpatient Follow-up:  Repeat basic metabolic panels of electrolytes and renal function Repeat CBC to follow hemoglobin trend  Discharge Diagnoses:  Active Problems:   Lower GI bleed Hypertension GERD Hyperlipidemia Recent no hemorrhagic CVA Depression/anxiety  Discharge Condition: Stable and improved.  Discharged home with instruction to follow-up with PCP in 10 days.  CODE STATUS: Full code.  Diet recommendation: Heart healthy diet.  Filed Weights   12/15/20 1143 12/17/20 1124  Weight: 49.4 kg 49.4 kg    History of present illness:  Peggy Ramirez is a 87 y.o. female with medical history significant of chronic constipation, hyperlipidemia, hypertension, recent hospitalization for CVA (July 2022) when she was started on dual antiplatelet therapy for 30 days; who presented to the hospital secondary to bright red blood per rectum.  Patient expressed symptoms present for the last 3 days prior to admission.  No associated abdominal pain, nausea, vomiting, sick contacts, fever or unusual food intake.  Patient also denies chest pain, shortness of breath, dysuria, hematuria, blurred vision, headaches or any other complaints. Hemodynamically stable NAD;     ED Course: Hemoglobin demonstrating drop from 13 approximately 50-monthago down to 10.1; rest of her blood work unremarkable.  Positive fecal occult blood test.  Case discussed with gastroenterology service who is recommending holding aspirin and Plavix, full liquid diet and hospital admission; they will see patient on 12/16/2020 and might offered her colonoscopy evaluation on 12/17/20.  Large-bore IVs placement and type and screen done while in the ED.  Hospital Course:  1-acute blood loss anemia/lower GI  bleed -Hemoglobin currently 9.4  -Hemodynamically stable currently -Continue avoiding the use of NSAIDs -Status post colonoscopy evaluation which demonstrated nonbleeding internal hemorrhoids and diverticulosis appreciated in patient's sigmoid, descending and transverse colon.  No active bleeding seen. -Safe to resume diet, go home and resume the use of Plavix on 12/18/2020.   2-essential hypertension -Is stable and well-controlled -Continue current antihypertensive regimen. -Heart healthy diet has been encouraged.   3-hyperlipidemia -Continue Zetia and Zocor -Continue heart healthy diet.   4-history of CVA -Continue risk factors modification -No new deficits appreciated -Per GI service recommendations safe to resume the use of Plavix on 12/18/2020. -Continue outpatient follow-up with neurology service as previously instructed.   5-anxiety/depression -Continue the use of as needed Xanax and nightly trazodone -Mood stable.   6-GERD -continue PPI  Procedures: Colonoscopy evaluation; demonstrating no active bleeding; positive internal hemorrhoids and diverticulosis in the sigmoid, descending and transverse colon.  Consultations: Gastroenterology service  Discharge Exam: Vitals:   12/17/20 1330 12/17/20 1419  BP: 124/60 (!) 154/87  Pulse:  79  Resp: 14 16  Temp: 98.1 F (36.7 C) 97.6 F (36.4 C)  SpO2: 93% 92%    General: Hemodynamically stable, no chest pain, no nausea, no vomiting, no shortness of breath.  Patient denies abdominal pain.  While receiving bowel prep overnight she had 2 bowel movements that demonstrated blood content; none since then. Cardiovascular: S1 and S2, no rubs, no gallops, no JVD Respiratory: Clear to auscultation bilaterally; no using accessory muscle. Abdomen: Soft, nontender, nondistended, positive bowel sounds Extremities: No cyanosis or clubbing.  Discharge Instructions   Discharge Instructions     Diet - low sodium heart healthy    Complete by: As directed    Discharge instructions  Complete by: As directed    Arrange follow-up with PCP in 10 days Maintain adequate hydration Take medications as prescribed Follow heart healthy diet. With the use of NSAIDs -Safe to resume the daily use of Plavix on 12/18/2020.      Allergies as of 12/17/2020       Reactions   Atorvastatin Other (See Comments)   Muscle aches   Cellcept [mycophenolate Mofetil] Other (See Comments)   unknown   Medrol [methylprednisolone] Other (See Comments)   unknown   Mycophenolate Mofetil Other (See Comments)   unknown   Penicillins Itching   Sulfonamide Derivatives Nausea Only   Topiramate Other (See Comments)   unknown   Verapamil Other (See Comments)   Felt shakey   Levofloxacin Rash        Medication List     STOP taking these medications    aspirin EC 81 MG tablet   diazepam 5 MG tablet Commonly known as: VALIUM   loratadine 10 MG tablet Commonly known as: CLARITIN   Vitamin E 400 units Tabs   zinc gluconate 50 MG tablet       TAKE these medications    3-in-1 Bedside Toilet Misc Patient needs 3 in 1 bedside toilet. Recent hip fracture with repair   ALPRAZolam 0.5 MG tablet Commonly known as: XANAX Take 1 tablet (0.5 mg total) by mouth 3 (three) times daily as needed for anxiety. What changed: when to take this   carboxymethylcellulose 1 % ophthalmic solution Apply 1 drop to eye daily as needed.   clopidogrel 75 MG tablet Commonly known as: PLAVIX Take 1 tablet (75 mg total) by mouth daily. Start taking on: December 18, 2020   docusate sodium 100 MG capsule Commonly known as: COLACE Take 1 capsule (100 mg total) by mouth 2 (two) times daily.   ezetimibe 10 MG tablet Commonly known as: ZETIA Take 1 tablet (10 mg total) by mouth daily at 6 PM.   HYDROCHLOROTHIAZIDE PO Take 12.5 tablets by mouth daily.   multivitamin tablet Take 1 tablet by mouth daily.   pantoprazole 40 MG tablet Commonly  known as: PROTONIX Take 40 mg by mouth daily.   polyethylene glycol powder 17 GM/SCOOP powder Commonly known as: GLYCOLAX/MIRALAX Take 17 g by mouth daily as needed for mild constipation. What changed:  when to take this reasons to take this   simvastatin 20 MG tablet Commonly known as: ZOCOR Take 1 tablet (20 mg total) by mouth daily at 6 PM. What changed: when to take this   traZODone 50 MG tablet Commonly known as: DESYREL Take 1 tablet (50 mg total) by mouth at bedtime.   vitamin C 1000 MG tablet Take 1,000 mg by mouth daily.   VITAMIN D-3 PO Take 1 tablet by mouth daily.       Allergies  Allergen Reactions   Atorvastatin Other (See Comments)    Muscle aches   Cellcept [Mycophenolate Mofetil] Other (See Comments)    unknown   Medrol [Methylprednisolone] Other (See Comments)    unknown   Mycophenolate Mofetil Other (See Comments)    unknown   Penicillins Itching   Sulfonamide Derivatives Nausea Only   Topiramate Other (See Comments)    unknown   Verapamil Other (See Comments)    Felt shakey   Levofloxacin Rash    Follow-up Information     Sasser, Silvestre Moment, MD. Schedule an appointment as soon as possible for a visit in 10 day(s).   Specialty: Family Medicine Contact information:  [place] [address] [phone]         Arnoldo Lenis, MD .   Specialty: Cardiology Contact information: [place] [address] [phone]                  The results of significant diagnostics from this hospitalization (including imaging, microbiology, ancillary and laboratory) are listed below for reference.    Significant Diagnostic Studies: No results found.  Microbiology: Recent Results (from the past 240 hour(s))  Resp Panel by RT-PCR (Flu A&B, Covid) Nasopharyngeal Swab     Status: None   Collection Time: 12/15/20  7:11 PM   Specimen: Nasopharyngeal Swab; Nasopharyngeal(NP) swabs in vial transport medium   Result Value Ref Range Status   SARS Coronavirus 2 by RT PCR NEGATIVE NEGATIVE Final    Comment: (NOTE) SARS-CoV-2 target nucleic acids are NOT DETECTED.  The SARS-CoV-2 RNA is generally detectable in upper respiratory specimens during the acute phase of infection. The lowest concentration of SARS-CoV-2 viral copies this assay can detect is 138 copies/mL. A negative result does not preclude SARS-Cov-2 infection and should not be used as the sole basis for treatment or other patient management decisions. A negative result may occur with  improper specimen collection/handling, submission of specimen other than nasopharyngeal swab, presence of viral mutation(s) within the areas targeted by this assay, and inadequate number of viral copies(<138 copies/mL). A negative result must be combined with clinical observations, patient history, and epidemiological information. The expected result is Negative.  Fact Sheet for Patients:  EntrepreneurPulse.com.au  Fact Sheet for Healthcare Providers:  IncredibleEmployment.be  This test is no t yet approved or cleared by the [place] FDA and  has been authorized for detection and/or diagnosis of SARS-CoV-2 by FDA under an Emergency Use Authorization (EUA). This EUA will remain  in effect (meaning this test can be used) for the duration of the COVID-19 declaration under Section 564(b)(1) of the Act, 21 U.S.C.section 360bbb-3(b)(1), unless the authorization is terminated  or revoked sooner.       Influenza A by PCR NEGATIVE NEGATIVE Final   Influenza B by PCR NEGATIVE NEGATIVE Final    Comment: (NOTE) The Xpert Xpress SARS-CoV-2/FLU/RSV plus assay is intended as an aid in the diagnosis of influenza from Nasopharyngeal swab specimens and should not be used as a sole basis for treatment. Nasal washings and aspirates are unacceptable for Xpert Xpress SARS-CoV-2/FLU/RSV testing.  Fact Sheet for  Patients: EntrepreneurPulse.com.au  Fact Sheet for Healthcare Providers: IncredibleEmployment.be  This test is not yet approved or cleared by the [place] FDA and has been authorized for detection and/or diagnosis of SARS-CoV-2 by FDA under an Emergency Use Authorization (EUA). This EUA will remain in effect (meaning this test can be used) for the duration of the COVID-19 declaration under Section 564(b)(1) of the Act, 21 U.S.C. section 360bbb-3(b)(1), unless the authorization is terminated or revoked.  Performed at [facility], [address] [place], [place] [address]      Labs: Basic Metabolic Panel: Recent Labs  Lab 12/15/20 1205 12/16/20 0542 12/17/20 0518  NA 132* 134* 138  K 3.9 3.4* 4.3  CL 100 104 105  CO2 '30 26 28  '$ GLUCOSE 103* 86 91  BUN '12 9 8  '$ CREATININE 0.65 0.70 0.77  CALCIUM 10.0 9.3 9.9  MG  --   --  2.2   Liver Function Tests: Recent Labs  Lab 12/15/20 1205  AST 29  ALT 27  ALKPHOS 64  BILITOT 0.4  PROT 6.8  ALBUMIN 3.2*   CBC: Recent Labs  Lab 12/15/20 1205 12/16/20 0542 12/17/20 0518  WBC 4.9 5.0 4.9  HGB 10.4* 9.2* 9.4*  HCT 31.0* 27.6* 29.2*  MCV 96.6 95.8 98.0  PLT 266 242 285    Signed:  Barton Dubois MD.  [facility] 12/17/2020, 4:01 PM

## 2020-12-17 NOTE — Interval H&P Note (Signed)
History and Physical Interval Note:  12/17/2020 12:39 PM  Peggy Ramirez  has presented today for surgery, with the diagnosis of lower GI bleeding.  The various methods of treatment have been discussed with the patient and family. After consideration of risks, benefits and other options for treatment, the patient has consented to  Procedure(s): COLONOSCOPY WITH PROPOFOL (N/A) as a surgical intervention.  The patient's history has been reviewed, patient examined, no change in status, stable for surgery.  I have reviewed the patient's chart and labs.  Questions were answered to the patient's satisfaction.     Eloise Harman

## 2020-12-17 NOTE — Plan of Care (Signed)
  Problem: Education: Goal: Knowledge of General Education information will improve Description Including pain rating scale, medication(s)/side effects and non-pharmacologic comfort measures Outcome: Progressing   

## 2020-12-17 NOTE — Anesthesia Postprocedure Evaluation (Signed)
Anesthesia Post Note  Patient: Peggy Ramirez  Procedure(s) Performed: COLONOSCOPY WITH PROPOFOL BIOPSY  Patient location during evaluation: Phase II Anesthesia Type: General Level of consciousness: awake and alert and oriented Pain management: pain level controlled Vital Signs Assessment: post-procedure vital signs reviewed and stable Respiratory status: spontaneous breathing and respiratory function stable Cardiovascular status: blood pressure returned to baseline and stable Postop Assessment: no apparent nausea or vomiting Anesthetic complications: no   No notable events documented.   Last Vitals:  Vitals:   12/17/20 1330 12/17/20 1419  BP: 124/60 (!) 154/87  Pulse:  79  Resp: 14 16  Temp: 36.7 C 36.4 C  SpO2: 93% 92%    Last Pain:  Vitals:   12/17/20 1330  TempSrc:   PainSc: 0-No pain                 Tatiana Courter C Emmagrace Runkel

## 2020-12-17 NOTE — Progress Notes (Signed)
Patient received both tap water enemas one at 0800 the second at 0905, patient tolerated well.

## 2020-12-17 NOTE — Anesthesia Procedure Notes (Signed)
Date/Time: 12/17/2020 12:57 PM Performed by: Orlie Dakin, CRNA Pre-anesthesia Checklist: Patient identified, Emergency Drugs available, Suction available and Patient being monitored Patient Re-evaluated:Patient Re-evaluated prior to induction Oxygen Delivery Method: Nasal cannula Induction Type: IV induction Placement Confirmation: positive ETCO2

## 2020-12-17 NOTE — Anesthesia Preprocedure Evaluation (Signed)
Anesthesia Evaluation  Patient identified by MRN, date of birth, ID band Patient awake    Reviewed: Allergy & Precautions, NPO status , Patient's Chart, lab work & pertinent test results  Airway Mallampati: II  TM Distance: >3 FB Neck ROM: Full    Dental  (+) Dental Advisory Given, Edentulous Lower, Missing   Pulmonary    Pulmonary exam normal breath sounds clear to auscultation       Cardiovascular Exercise Tolerance: Good hypertension, Pt. on medications Normal cardiovascular exam Rhythm:Regular Rate:Normal     Neuro/Psych  Headaches, CVA, Residual Symptoms    GI/Hepatic GERD  Medicated and Controlled,  Endo/Other    Renal/GU Renal InsufficiencyRenal disease     Musculoskeletal negative musculoskeletal ROS (+)   Abdominal   Peds  Hematology negative hematology ROS (+)   Anesthesia Other Findings sjogren's syndrome   Reproductive/Obstetrics negative OB ROS                             Anesthesia Physical Anesthesia Plan  ASA: 3  Anesthesia Plan: General   Post-op Pain Management:    Induction: Intravenous  PONV Risk Score and Plan: TIVA  Airway Management Planned: Nasal Cannula and Natural Airway  Additional Equipment:   Intra-op Plan:   Post-operative Plan:   Informed Consent: I have reviewed the patients History and Physical, chart, labs and discussed the procedure including the risks, benefits and alternatives for the proposed anesthesia with the patient or authorized representative who has indicated his/her understanding and acceptance.     Dental advisory given  Plan Discussed with: CRNA and Surgeon  Anesthesia Plan Comments:         Anesthesia Quick Evaluation

## 2020-12-17 NOTE — Progress Notes (Signed)
Patient discharged home today, transported by son. Discharge paperwork went over with son and patient, both verbalized understanding. Belongings sent home with patient. Patient stable upon discharge.

## 2020-12-18 ENCOUNTER — Encounter (HOSPITAL_COMMUNITY): Payer: Self-pay | Admitting: Internal Medicine

## 2020-12-19 LAB — SURGICAL PATHOLOGY

## 2020-12-24 ENCOUNTER — Encounter: Payer: Self-pay | Admitting: *Deleted

## 2020-12-24 ENCOUNTER — Ambulatory Visit: Payer: Medicare Other | Admitting: Family Medicine

## 2020-12-25 DIAGNOSIS — Z6820 Body mass index (BMI) 20.0-20.9, adult: Secondary | ICD-10-CM | POA: Diagnosis not present

## 2020-12-25 DIAGNOSIS — D500 Iron deficiency anemia secondary to blood loss (chronic): Secondary | ICD-10-CM | POA: Diagnosis not present

## 2020-12-25 DIAGNOSIS — I10 Essential (primary) hypertension: Secondary | ICD-10-CM | POA: Diagnosis not present

## 2020-12-25 DIAGNOSIS — K922 Gastrointestinal hemorrhage, unspecified: Secondary | ICD-10-CM | POA: Diagnosis not present

## 2020-12-25 DIAGNOSIS — N183 Chronic kidney disease, stage 3 unspecified: Secondary | ICD-10-CM | POA: Diagnosis not present

## 2020-12-26 ENCOUNTER — Encounter: Payer: Self-pay | Admitting: Diagnostic Neuroimaging

## 2020-12-26 ENCOUNTER — Ambulatory Visit (INDEPENDENT_AMBULATORY_CARE_PROVIDER_SITE_OTHER): Payer: Medicare Other | Admitting: Diagnostic Neuroimaging

## 2020-12-26 VITALS — BP 124/70 | HR 69 | Ht 60.0 in | Wt 110.4 lb

## 2020-12-26 DIAGNOSIS — I63313 Cerebral infarction due to thrombosis of bilateral middle cerebral arteries: Secondary | ICD-10-CM

## 2020-12-26 DIAGNOSIS — F03A0 Unspecified dementia, mild, without behavioral disturbance, psychotic disturbance, mood disturbance, and anxiety: Secondary | ICD-10-CM

## 2020-12-26 DIAGNOSIS — K2100 Gastro-esophageal reflux disease with esophagitis, without bleeding: Secondary | ICD-10-CM | POA: Diagnosis not present

## 2020-12-26 DIAGNOSIS — N183 Chronic kidney disease, stage 3 unspecified: Secondary | ICD-10-CM | POA: Diagnosis not present

## 2020-12-26 DIAGNOSIS — F0390 Unspecified dementia without behavioral disturbance: Secondary | ICD-10-CM

## 2020-12-26 DIAGNOSIS — I129 Hypertensive chronic kidney disease with stage 1 through stage 4 chronic kidney disease, or unspecified chronic kidney disease: Secondary | ICD-10-CM | POA: Diagnosis not present

## 2020-12-26 DIAGNOSIS — E7849 Other hyperlipidemia: Secondary | ICD-10-CM | POA: Diagnosis not present

## 2020-12-26 NOTE — Progress Notes (Signed)
[facility]  PATIENT: Peggy Ramirez DOB: 1934/01/15  REFERRING CLINICIAN: Deatra James, MD HISTORY FROM: patient  REASON FOR VISIT: new consult    HISTORICAL  CHIEF COMPLAINT:  Chief Complaint  Patient presents with   New Patient (Initial Visit)    Rm 6 with son. Here for f/u on stroke. Went to ED of 11/12/2020 for stroke like sx. Pt reports she has been doing ok since this work up.     HISTORY OF PRESENT ILLNESS:   87 year old female with hypertension, hyperlipidemia, here for evaluation of stroke.  11/12/2020 patient was having confusion and hallucinations brought to the hospital.  In the course of work-up she is found to have small bilateral ischemic infarctions.  Stroke work-up was completed.  Symptoms improved and patient was discharged home. Since that time patient is doing well.  She feels back to her baseline.  Also since 2018 patient has had some mild short-term memory loss and confusion, visual hallucinations that have gradually progressed over time.  Also having some gait and balance difficulties.     REVIEW OF SYSTEMS: Full 14 system review of systems performed and negative with exception of: as per HPI.  ALLERGIES: Allergies  Allergen Reactions   Atorvastatin Other (See Comments)    Muscle aches   Cellcept [Mycophenolate Mofetil] Other (See Comments)    unknown   Medrol [Methylprednisolone] Other (See Comments)    unknown   Mycophenolate Mofetil Other (See Comments)    unknown   Penicillins Itching   Sulfonamide Derivatives Nausea Only   Topiramate Other (See Comments)    unknown   Verapamil Other (See Comments)    Felt shakey   Levofloxacin Rash    HOME MEDICATIONS: Outpatient Medications Prior to Visit  Medication Sig Dispense Refill   ALPRAZolam (XANAX) 0.25 MG tablet Take 0.25 mg by mouth at bedtime as needed for anxiety.     Ascorbic Acid (VITAMIN C) 1000 MG tablet Take 1,000 mg by mouth daily.      carboxymethylcellulose 1 % ophthalmic solution Apply 1 drop to eye daily as needed.     Cholecalciferol (VITAMIN D-3 PO) Take 1 tablet by mouth daily.     clopidogrel (PLAVIX) 75 MG tablet Take 1 tablet (75 mg total) by mouth daily. 30 tablet 1   docusate sodium (COLACE) 100 MG capsule Take 1 capsule (100 mg total) by mouth 2 (two) times daily. 10 capsule 0   HYDROCHLOROTHIAZIDE PO Take 12.5 tablets by mouth daily.     Misc. Devices (3-IN-1 BEDSIDE TOILET) MISC Patient needs 3 in 1 bedside toilet. Recent hip fracture with repair 1 each 1   Multiple Vitamin (MULTIVITAMIN) tablet Take 1 tablet by mouth daily.     pantoprazole (PROTONIX) 40 MG tablet Take 40 mg by mouth daily.     polyethylene glycol powder (GLYCOLAX/MIRALAX) 17 GM/SCOOP powder Take 17 g by mouth daily as needed for mild constipation. 255 g 0   ezetimibe (ZETIA) 10 MG tablet Take 1 tablet (10 mg total) by mouth daily at 6 PM. 30 tablet 0   traZODone (DESYREL) 50 MG tablet Take 1 tablet (50 mg total) by mouth at bedtime. 30 tablet 0   simvastatin (ZOCOR) 20 MG tablet Take 1 tablet (20 mg total) by mouth daily at 6 PM. (Patient taking differently: Take 20 mg by mouth daily.) 30 tablet 1   No facility-administered medications prior to visit.    PAST MEDICAL HISTORY: Past Medical History:  Diagnosis Date   Acute  metabolic encephalopathy    Chronic constipation    Chronic left lower quadrant pain    GERD (gastroesophageal reflux disease)    Headache(784.0)    High cholesterol    Hydrosalpinx    LEFT   Hypertension    Lupus ([place])    Membranous glomerulonephritis    followed by Dr. Elige Radon   Ovarian fibroma    RIGHT   Pelvic adhesions    Sjogren's syndrome ([place])    Stroke [facility])     PAST SURGICAL HISTORY: Past Surgical History:  Procedure Laterality Date   APPENDECTOMY  04/28/2000   BIOPSY  12/17/2020   Procedure: BIOPSY;  Surgeon: Eloise Harman, DO;  Location: [facility];  Service: Endoscopy;;    COLONOSCOPY WITH PROPOFOL N/A 12/17/2020   Procedure: COLONOSCOPY WITH PROPOFOL;  Surgeon: Eloise Harman, DO;  Location: [facility];  Service: Endoscopy;  Laterality: N/A;   FRACTURE SURGERY     HIP PINNING,CANNULATED Left 12/15/2019   Procedure: CANNULATED HIP PINNING;  Surgeon: Shona Needles, MD;  Location: [place];  Service: Orthopedics;  Laterality: Left;   LAPAROSCOPIC CHOLECYSTECTOMY  04/28/2000   TONSILLECTOMY  04/29/1939   TOTAL ABDOMINAL HYSTERECTOMY W/ BILATERAL SALPINGOOPHORECTOMY  04/29/2003   VAGINAL HYSTERECTOMY  04/29/1979    FAMILY HISTORY: Family History  Problem Relation Age of Onset   Heart disease Other    Anxiety disorder Other    Depression Other     SOCIAL HISTORY: Social History   Socioeconomic History   Marital status: Widowed    Spouse name: Not on file   Number of children: 4   Years of education: 12   Highest education level: Not on file  Occupational History   Occupation: retired    Fish farm manager: Designer, television/film set  Tobacco Use   Smoking status: Never   Smokeless tobacco: Never  Vaping Use   Vaping Use: Never used  Substance and Sexual Activity   Alcohol use: No    Alcohol/week: 0.0 standard drinks   Drug use: Never   Sexual activity: Not Currently  Other Topics Concern   Not on file  Social History Narrative   Patient lives at home alone.   Caffeine Use: 1-2 cups daily   Right handed    Social Determinants of Health   Financial Resource Strain: Not on file  Food Insecurity: Not on file  Transportation Needs: Not on file  Physical Activity: Not on file  Stress: Not on file  Social Connections: Not on file  Intimate Partner Violence: Not on file    PHYSICAL EXAM  GENERAL EXAM/CONSTITUTIONAL: Vitals:  Vitals:   12/26/20 1054  BP: 124/70  Pulse: 69  SpO2: 99%  Weight: 110 lb 6 oz (50.1 kg)  Height: 5' (1.524 m)   Body mass index is 21.56 kg/m. Wt Readings from Last 3 Encounters:  12/26/20 110 lb 6 oz (50.1 kg)   12/17/20 109 lb (49.4 kg)  11/12/20 109 lb (49.4 kg)   Patient is in no distress; well developed, nourished and groomed; neck is supple  CARDIOVASCULAR: Examination of carotid arteries is normal; no carotid bruits Regular rate and rhythm, no murmurs Examination of peripheral vascular system by observation and palpation is normal  EYES: Ophthalmoscopic exam of optic discs and posterior segments is normal; no papilledema or hemorrhages No results found.  MUSCULOSKELETAL: Gait, strength, tone, movements noted in Neurologic exam below  NEUROLOGIC: MENTAL STATUS:  No flowsheet data found. awake, alert, oriented to person, place and time (august, 31,  2022; [place]; does not know building) language fluent, comprehension intact, naming intact fund of knowledge appropriate  CRANIAL NERVE:  2nd - no papilledema on fundoscopic exam 2nd, 3rd, 4th, 6th - pupils equal and reactive to light, visual fields full to confrontation, extraocular muscles intact, no nystagmus 5th - facial sensation symmetric 7th - facial strength symmetric 8th - hearing intact 9th - palate elevates symmetrically, uvula midline 11th - shoulder shrug symmetric 12th - tongue protrusion midline  MOTOR:  normal bulk and tone, full strength in the BUE, BLE  SENSORY:  normal and symmetric to light touch, temperature, vibration  COORDINATION:  finger-nose-finger, fine finger movements normal  REFLEXES:  deep tendon reflexes TRACE and symmetric  GAIT/STATION:  narrow based gait; STOOPED POSTURE; SLOW GAIT AND TURNING; SLIGHTLY UNSTEADY     DIAGNOSTIC DATA (LABS, IMAGING, TESTING) - I reviewed patient records, labs, notes, testing and imaging myself where available.  Lab Results  Component Value Date   WBC 4.9 12/17/2020   HGB 9.4 (L) 12/17/2020   HCT 29.2 (L) 12/17/2020   MCV 98.0 12/17/2020   PLT 285 12/17/2020      Component Value Date/Time   NA 138 12/17/2020 0518   K 4.3 12/17/2020 0518    CL 105 12/17/2020 0518   CO2 28 12/17/2020 0518   GLUCOSE 91 12/17/2020 0518   BUN 8 12/17/2020 0518   CREATININE 0.77 12/17/2020 0518   CALCIUM 9.9 12/17/2020 0518   PROT 6.8 12/15/2020 1205   ALBUMIN 3.2 (L) 12/15/2020 1205   AST 29 12/15/2020 1205   ALT 27 12/15/2020 1205   ALKPHOS 64 12/15/2020 1205   BILITOT 0.4 12/15/2020 1205   GFRNONAA >60 12/17/2020 0518   GFRAA >60 12/16/2019 0404   Lab Results  Component Value Date   CHOL 163 10/16/2015   HDL 39 (L) 10/16/2015   LDLCALC 93 10/16/2015   TRIG 156 (H) 10/16/2015   CHOLHDL 4.2 10/16/2015   Lab Results  Component Value Date   HGBA1C 5.2 10/15/2015   Lab Results  Component Value Date   [id number] 11/13/2020   Lab Results  Component Value Date   TSH 1.103 11/13/2020    11/13/20 MRI brain [I reviewed images myself and agree with interpretation. -VRP]  1. 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 Possible additional small acute infarct in the left frontal cortex. No significant edema or mass effect. Given potential involvement of multiple vascular territories, consider a central embolic etiology. 2. 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 3. Moderate chronic microvascular disease and mild atrophy.   11/13/20 MRA head   1. Motion limited evaluation with suspected at least moderate stenosis of the right M1 MCA and right intradural vertebral artery. 2. Mild-to-moderate left M1 MCA stenosis.   11/14/20 CT head: 1. Known small acute infarcts within the right frontal lobe white matter and left frontal lobe cortex were better appreciated on the brain MRI of 11/13/2020. 2. Redemonstrated small chronic cortical infarct within the anterior left frontal lobe. 3. Background moderate cerebral white matter chronic small vessel ischemic disease. 4. Mild generalized  cerebral atrophy. 5. Small right mastoid effusion.   11/14/20 CTA neck: 1. Atherosclerotic plaque within the proximal right subclavian artery results in 65% stenosis. 2. The bilateral common carotid and internal carotid arteries are patent within the neck. Atherosclerotic plaque within the proximal right ICA results in 40% stenosis. Minimal atherosclerotic plaque  within the left carotid system within the neck. 3. Vertebral arteries codominant and patent within the neck without stenosis. 4. Multiple thyroid nodules, the largest within the right lobe measuring 2 cm. A dedicated thyroid ultrasound may be obtained for further evaluation as clinically warranted (given the patient's advanced age).   11/14/20 CTA head: 1. Atherosclerotic disease. No intracranial large vessel occlusion or proximal high-grade stenosis. 2. 1-2 mm inferiorly projecting vascular protrusion arising from the supraclinoid right ICA, which may reflect an aneurysm or infundibulum.     ASSESSMENT AND PLAN  87 y.o. year old female here with:   Dx:  1. Cerebrovascular accident (CVA) due to bilateral thrombosis of middle cerebral arteries ([place])   2. Mild dementia ([place])     PLAN:  STROKES (small bilateral frontal; likely small vessel disease; regarding embolic workup, patient not good candidate for anticoagulation due to h/o GI bleeding, balance difficulty, dementia) - continue clopidogrel, statin, ezetimibe - caution with balance; use cane/walker  MEMORY LOSS / HALLUCINATIONS / PARANOIA (since ~2018; suspect mild dementia with lewy bodies) - safety / supervision issues reviewed - daily physical activity / exercise (at least 15-30 minutes) - eat more plants / vegetables - increase social activities, brain stimulation, games, puzzles, hobbies, crafts, arts, music - aim for at least 7-8 hours sleep per night (or more) - avoid smoking and alcohol - caregiver resources provided - caution with medications,  finances, living alone; no driving (has gotten lost)  Return for return to PCP, pending if symptoms worsen or fail to improve.    Penni Bombard, MD [id number], [id number] AM Certified in Neurology, Neurophysiology and Neuroimaging  [facility] [address], [place], [place] [address] [phone]

## 2020-12-26 NOTE — Patient Instructions (Signed)
STROKES - continue clopidogrel, statin, ezetimibe - caution with balance; use cane/walker  MEMORY LOSS / HALLUCINATIONS / PARANOIA (since ~2018; suspect mild dementia with lewy bodies) - safety / supervision issues reviewed - daily physical activity / exercise (at least 15-30 minutes) - eat more plants / vegetables - increase social activities, brain stimulation, games, puzzles, hobbies, crafts, arts, music - aim for at least 7-8 hours sleep per night (or more) - avoid smoking and alcohol - caregiver resources provided - caution with medications, finances; no driving

## 2020-12-28 ENCOUNTER — Encounter: Payer: Self-pay | Admitting: Diagnostic Neuroimaging

## 2021-01-02 NOTE — Progress Notes (Signed)
Cardiology Office Note  Date: 01/02/2021   ID: Peggy Ramirez, DOB 1934-02-15, MRN [id number]  PCP:  Manon Hilding, MD  Cardiologist:  Carlyle Dolly, MD Electrophysiologist:  None   Chief Complaint: Hospital follow-up status post lower GI bleed.  Recent nonhemorrhagic CVA  History of Present Illness: Peggy Ramirez is a 87 y.o. female with a history of HTN, Lupus, HLD, recent GI bleed, nonhemorrhagic CVA,  She was last seen by Dr. Harl Bowie on 10/11/2019.  She was experiencing mild chest pain at times with nonspecific symptoms.  No exertional symptoms.  She was compliant with her blood pressure medications.  History of CVA with admission on June 2017 with left-handed weakness.  MRI demonstrating small cortical infarct in posterior right MCA territory.  He had 30-day event monitor without significant arrhythmias.  She had a long history of chest pain with negative ischemic testing.  She had no recent specific cardiac symptoms.  Plan was to continue to monitor.  Blood pressure was at goal and she was continuing current medication.  She was off statin due to side effects.  Her LDL without therapy was reasonable.  Dr. Harl Bowie did not see strong indication to consider statin alternatives at that time.  She was previously here recent complaints of shortness of breath over the last 2 to 3 months.  Also complained of recent chest tightness/chest pain which can occur suddenly with or without activity.  She described it as a significant pain but is unable to rate.  Lasting only seconds.  She denied radiation or association with exertion.  She denies any associated nausea, vomiting, or diaphoresis when it occurs.  She stated it could sometimes occur at random.  She stated it seemed to be sporadic and can occur sometimes more frequently and at other times less frequently.  She also complained of some dizziness with symptoms of lightheadedness and occasional sensation of feeling near syncopal.  He stated  sometimes she felt her heart racing or palpitating when this occurs.  She denies any PND or orthopnea.  No new CVA or TIA-like symptoms.  Denies any claudication-like symptoms, bleeding, DVT or PE-like symptoms, lower extremity edema.  She stated her PCP Dr. Quintin Alto recently stopped her HCTZ and potassium.  She was admitted on 11/12/2020 for acute stroke resulting in acute metabolic encephalopathy/psychosis.  MRI brain and MRA of head 11/13/2020 showed acute infarct in right frontal white matter.  Possible additional small acute infarct in left frontal cortex.  No significant edema or mass-effect.  Given potential involvement with vascular territories CVA was deemed likely embolic in etiology.  Neurologist recommended a 30-day event monitor.  Cardiology was notified to assist.  She was eventually discharged to skilled nursing facility.  She was admitted on 12/23/2020 for GI bleed.  After recent CVA in July 2022 she was started on dual antiplatelet therapy for 30 days.  She presented to hospital with BRBPR.  Hemoglobin had dropped from 13 approximately 2 months prior down to 10.1.  Other blood work was unremarkable.  She had positive FOBT.  GI was consulted and recommended holding aspirin and Plavix.  Her hemoglobin did eventually decrease to 9.4.  She had a colonoscopy which demonstrated nonbleeding internal hemorrhoids and diverticulosis in sigmoid, descending and transverse colon.  No active bleeding was seen.  Plan was to discharge and resume use of Plavix on 12/18/2020.  Her blood pressure was stable and well-controlled.  She was continuing current antihypertensive regimen.  She was continuing Zetia and Zocor.  She was continuing PPI for GERD.  She was continuing Xanax and nightly trazodone for anxiety and depression.  She is here for follow-up today and status post recent hospitalization for GI bleed and in the interim since last visit she had a stroke.  She has no focal neurologic deficits from stroke.  Her  son states she never had any physical changes it was mostly altered mental status and complaints of hallucinations.  She denies any further GI bleeding.  She had a subsequent colonoscopy which noted bleeding.  She did have internal hemorrhoids and diverticulosis.  Since that time she resumed Plavix.  No bleeding since that time.  There is some confusion as to whether she would should be on the HCTZ.  At last hospital visit discharge note stated she was to continue HCTZ at 12.5 mg.  Patient's son states he believes Dr. Quintin Alto had discontinued that along with potassium previously.  He states they will check with Dr. Quintin Alto.  Her blood pressure is elevated today at 142/82.  Recheck in left arm 150/80.  She denies any further issues with CVA or TIA-like symptoms.  States she notices some transient disequilibrium when she flexes her head forward when eating but no orthostatic like symptoms/lightheadedness, dizziness, near syncopal or syncopal episodes.  Son states that since she started taking Protonix daily her chest discomfort has subsided.  We did have her scheduled for stress test but currently she denies any chest pain since being on on Protonix.  We reviewed the results of her echocardiogram and cardiac monitor.  She and son verbalized understanding.    Past Medical History:  Diagnosis Date   Acute metabolic encephalopathy    Chronic constipation    Chronic left lower quadrant pain    GERD (gastroesophageal reflux disease)    Headache(784.0)    High cholesterol    Hydrosalpinx    LEFT   Hypertension    Lupus ([place])    Membranous glomerulonephritis    followed by Dr. Elige Radon   Ovarian fibroma    RIGHT   Pelvic adhesions    Sjogren's syndrome ([place])    Stroke [facility])     Past Surgical History:  Procedure Laterality Date   APPENDECTOMY  04/28/2000   BIOPSY  12/17/2020   Procedure: BIOPSY;  Surgeon: Eloise Harman, DO;  Location: [facility];  Service: Endoscopy;;   COLONOSCOPY WITH  PROPOFOL N/A 12/17/2020   Procedure: COLONOSCOPY WITH PROPOFOL;  Surgeon: Eloise Harman, DO;  Location: [facility];  Service: Endoscopy;  Laterality: N/A;   FRACTURE SURGERY     HIP PINNING,CANNULATED Left 12/15/2019   Procedure: CANNULATED HIP PINNING;  Surgeon: Shona Needles, MD;  Location: [facility];  Service: Orthopedics;  Laterality: Left;   LAPAROSCOPIC CHOLECYSTECTOMY  04/28/2000   TONSILLECTOMY  04/29/1939   TOTAL ABDOMINAL HYSTERECTOMY W/ BILATERAL SALPINGOOPHORECTOMY  04/29/2003   VAGINAL HYSTERECTOMY  04/29/1979    Current Outpatient Medications  Medication Sig Dispense Refill   ALPRAZolam (XANAX) 0.25 MG tablet Take 0.25 mg by mouth at bedtime as needed for anxiety.     Ascorbic Acid (VITAMIN C) 1000 MG tablet Take 1,000 mg by mouth daily.     carboxymethylcellulose 1 % ophthalmic solution Apply 1 drop to eye daily as needed.     Cholecalciferol (VITAMIN D-3 PO) Take 1 tablet by mouth daily.     clopidogrel (PLAVIX) 75 MG tablet Take 1 tablet (75 mg total) by mouth daily. 30 tablet 1   docusate sodium (COLACE)  100 MG capsule Take 1 capsule (100 mg total) by mouth 2 (two) times daily. 10 capsule 0   ezetimibe (ZETIA) 10 MG tablet Take 1 tablet (10 mg total) by mouth daily at 6 PM. 30 tablet 0   HYDROCHLOROTHIAZIDE PO Take 12.5 tablets by mouth daily.     Misc. Devices (3-IN-1 BEDSIDE TOILET) MISC Patient needs 3 in 1 bedside toilet. Recent hip fracture with repair 1 each 1   Multiple Vitamin (MULTIVITAMIN) tablet Take 1 tablet by mouth daily.     pantoprazole (PROTONIX) 40 MG tablet Take 40 mg by mouth daily.     polyethylene glycol powder (GLYCOLAX/MIRALAX) 17 GM/SCOOP powder Take 17 g by mouth daily as needed for mild constipation. 255 g 0   traZODone (DESYREL) 50 MG tablet Take 1 tablet (50 mg total) by mouth at bedtime. 30 tablet 0   No current facility-administered medications for this visit.   Allergies:  Atorvastatin, Cellcept [mycophenolate mofetil], Medrol  [methylprednisolone], Mycophenolate mofetil, Penicillins, Sulfonamide derivatives, Topiramate, Verapamil, and Levofloxacin   Social History: The patient  reports that she has never smoked. She has never used smokeless tobacco. She reports that she does not drink alcohol and does not use drugs.   Family History: The patient's family history includes Anxiety disorder in an other family member; Depression in an other family member; Heart disease in an other family member.   ROS:  Please see the history of present illness. Otherwise, complete review of systems is positive for none.  All other systems are reviewed and negative.   Physical Exam: VS:  There were no vitals taken for this visit., BMI There is no height or weight on file to calculate BMI.  Wt Readings from Last 3 Encounters:  12/26/20 110 lb 6 oz (50.1 kg)  12/17/20 109 lb (49.4 kg)  11/12/20 109 lb (49.4 kg)    General: Patient appears comfortable at rest. Neck: Supple, no elevated JVP or carotid bruits, no thyromegaly. Lungs: Clear to auscultation, nonlabored breathing at rest. Cardiac: Regular rate and rhythm, no S3 or significant systolic murmur, no pericardial rub. Extremities: No pitting edema, distal pulses 2+. Skin: Warm and dry. Musculoskeletal: No kyphosis. Neuropsychiatric: Alert and oriented x3, affect grossly appropriate.  ECG: 10/31/2020 EKG shows normal sinus rhythm rate of 71, right superior axis deviation, septal infarct, age undetermined.  Recent Labwork: 11/13/2020: TSH 1.103 12/15/2020: ALT 27; AST 29 12/17/2020: BUN 8; Creatinine, Ser 0.77; Hemoglobin 9.4; Magnesium 2.2; Platelets 285; Potassium 4.3; Sodium 138     Component Value Date/Time   CHOL 163 10/16/2015 0612   TRIG 156 (H) 10/16/2015 0612   HDL 39 (L) 10/16/2015 0612   CHOLHDL 4.2 10/16/2015 0612   VLDL 31 10/16/2015 0612   LDLCALC 93 10/16/2015 0612    Other Studies Reviewed Today:   CTA NECK FINDINGS 11/14/2020   Aortic arch: Standard  aortic branching. The innominate artery origin is excluded from the field of view. Atherosclerotic plaque within the visualized aortic arch and proximal major branch vessels of the neck. Soft and calcified plaque within the proximal right subclavian artery results in 65% stenosis. No significant hemodynamically significant stenosis within the visualized innominate artery or proximal left subclavian artery.   Right carotid system: CCA and ICA patent within the neck. Soft and calcified plaque within the proximal ICA with resultant 40% stenosis.   Left carotid system: CCA and ICA patent within the neck without stenosis. Minimal soft plaque within the CCA. Mild calcified plaque at the carotid bifurcation.  Vertebral arteries: Codominant and patent within the neck without stenosis.   Skeleton: Cervical spondylosis with suspected degenerative fusion across the C4-C5 and C5-C6 disc spaces and multilevel facet joint ankylosis. No acute bony abnormality or aggressive osseous lesion.   Other neck: No cervical lymphadenopathy. Multiple thyroid nodules, the largest within the right lobe measuring 2.0 cm. Fatty atrophy of the bilateral parotid glands.   Upper chest: No consolidation within the imaged lung apices.   Review of the MIP images confirms the above findings   CTA HEAD FINDINGS   Anterior circulation:   The intracranial internal carotid arteries are patent. Calcified plaque within both vessels without stenosis. The M1 middle cerebral arteries are patent. Atherosclerotic irregularity of the M2 and more distal middle cerebral arteries bilaterally. No M2 proximal branch occlusion or high-grade proximal stenosis is identified. The anterior cerebral arteries are patent. 1-2 mm inferiorly projecting vascular protrusion arising from the supraclinoid right ICA, which may reflect an aneurysm or infundibulum (series 12, image 95).   Posterior circulation:   The intracranial vertebral  arteries are patent. The basilar artery is patent. Mild atherosclerotic irregularity of these vessels without stenosis. The posterior cerebral arteries are patent. Mild atherosclerotic irregularity of both vessels without significant proximal stenosis. Posterior communicating arteries are hypoplastic or absent bilaterally.   Venous sinuses: Within the limitations of contrast timing, no convincing thrombus.   Anatomic variants: As described   Review of the MIP images confirms the above findings   IMPRESSION: CT head:   1. Known small acute infarcts within the right frontal lobe white matter and left frontal lobe cortex were better appreciated on the brain MRI of 11/13/2020. 2. Redemonstrated small chronic cortical infarct within the anterior left frontal lobe. 3. Background moderate cerebral white matter chronic small vessel ischemic disease. 4. Mild generalized cerebral atrophy. 5. Small right mastoid effusion.   CTA neck:   1. Atherosclerotic plaque within the proximal right subclavian artery results in 65% stenosis. 2. The bilateral common carotid and internal carotid arteries are patent within the neck. Atherosclerotic plaque within the proximal right ICA results in 40% stenosis. Minimal atherosclerotic plaque within the left carotid system within the neck. 3. Vertebral arteries codominant and patent within the neck without stenosis. 4. Multiple thyroid nodules, the largest within the right lobe measuring 2 cm. A dedicated thyroid ultrasound may be obtained for further evaluation as clinically warranted (given the patient's advanced age).   CTA head:   1. Atherosclerotic disease. No intracranial large vessel occlusion or proximal high-grade stenosis. 2. 1-2 mm inferiorly projecting vascular protrusion arising from the supraclinoid right ICA, which may reflect an aneurysm or infundibulum.    MRI brain 11/13/2020 IMPRESSION: MRI:   1. Acute infarct in the right  frontal white matter. Possible additional small acute infarct in the left frontal cortex. No significant edema or mass effect. Given potential involvement of multiple vascular territories, consider a central embolic etiology. 2. 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 3. Moderate chronic microvascular disease and mild atrophy.   MRA:   1. Motion limited evaluation with suspected at least moderate stenosis of the right M1 MCA and right intradural vertebral artery. 2. Mild-to-moderate left M1 MCA stenosis.    Thyroid ultrasound 11/14/2020 IMPRESSION: 1. Heterogeneous, enlarged and multinodular thyroid gland most consistent with multinodular goiter. 2. A 1.4 cm TI-RADS category 4 nodule in the right mid gland meets criteria for imaging  surveillance. Recommend follow-up ultrasound in 1 year. 3. Additional bilateral thyroid nodules also noted incidentally but do not meet criteria to recommend further evaluation.    14-day ZIO monitor 11/01/2020 Patient had a min HR of 46 bpm, max HR of 193 bpm, and avg HR of 76 bpm. Predominant underlying rhythm was Sinus Rhythm. Bundle Branch Block/IVCD was present. 14 Supraventricular Tachycardia runs occurred, the run with the fastest interval lasting 6 beats with a max rate of 193 bpm, the longest lasting 20 beats with an avg rate of 110 bpm. Isolated SVEs were rare (<1.0%), SVE Couplets were rare (<1.0%), and SVE Triplets were rare (<1.0%). Isolated VEs were rare (<1.0%), VE Couplets were rare (<1.0%), and no VE Triplets were present. Ventricular Trigeminy was present.    Echocardiogram 11/13/2020  1. Left ventricular ejection fraction, by estimation, is 60 to 65%. The left ventricle has normal function. The left ventricle has no regional wall motion abnormalities. There is mild left ventricular  hypertrophy. Left ventricular diastolic parameters are indeterminate. 2. Right ventricular systolic function is normal. The right ventricular size is normal. There is normal pulmonary artery systolic pressure. The estimated right ventricular systolic pressure is 123456 mmHg. 3. There is a trivial pericardial effusion that is circumferential. 4. The mitral valve is grossly normal. No evidence of mitral valve regurgitation. 5. The aortic valve is tricuspid. There is mild calcification of the aortic valve. Aortic valve regurgitation is trivial. 6. The inferior vena cava is normal in size with greater than 50% respiratory variability, suggesting right atrial pressure of 3 mmHg.    11/2011 DSE Baseline LVEF 60-65%, negative for ischemia   10/2014 Lexiscan MPI The study is normal. This is a low risk study. Nuclear stress EF: 60%. There was no ST segment deviation noted during stress.   09/2015 echo Study Conclusions   - Left ventricle: The cavity size was normal. There was focal basal   hypertrophy. Systolic function was normal. The estimated ejection   fraction was in the range of 55% to 60%. Wall motion was normal;   there were no regional wall motion abnormalities. Doppler   parameters are consistent with abnormal left ventricular   relaxation (grade 1 diastolic dysfunction). - Aortic valve: Mildly calcified annulus. Trileaflet; normal   thickness leaflets. Valve area (VTI): 2.18 cm^2. Valve area   (Vmax): 2.24 cm^2. - Mitral valve: There was mild regurgitation. - Atrial septum: No defect or patent foramen ovale was identified. - Technically adequate study.   09/2015 Carotid US IMPRESSION: Mild amount of plaque at both carotid bifurcations, as above. Estimated bilateral ICA stenoses are less than 50%.   Assessment and Plan:  1. Shortness of breath   2. Chest tightness   3. Palpitations   4. History of CVA (cerebrovascular accident)   5. History of GI bleed     1. Shortness  of breath Currently denies any current shortness of breath. Echocardiogram on 11/13/2020 showed EF of 60 to 65%.  No WMA's, mild LVH.  Indeterminate diastolic parameters, normal PASP, trivial pericardial effusion circumferential.    2. Chest tightness /chest pain Since last visit they were unable to get the stress test we ordered due to recent CVA and GI bleed.  Patient and son states since she started Protonix every day she has had no further chest tightness or chest pain.  Will defer stress test for now unless symptoms recur  3.  Palpitations/dizziness Recent ZIO monitor On 11/01/2020 showed a minimum heart rate of 46, maximal heart rate  193, average heart rate of 76.  Predominant rhythm was sinus.  Bundle branch block/IVCD was present she had 14 supraventricular tachycardia runs with.  The run with the fastest interval lasted 6 beats with a maximum 193.  The longest lasting 20 beats with average 110.  She had some rare SVE's and SVE couplets/triplets.  Isolated VE's, VE couplets.  Ventricular trigeminy was present  4. History of CVA (cerebrovascular accident) History of CVA.  No focal neurological deficits.  No new neurological symptoms.  See MRI/MRA report above.   5.  Essential hypertension. At discharge from last hospital visit she was to be on HCTZ 12.5 mg daily.  Son states he believes Dr. Quintin Alto discontinue the HCTZ and potassium.  He states he will check with Dr. Quintin Alto to clarify blood pressure today initially 142/82.  Recheck in left arm was 150/80.  Need to clarify with Dr. Quintin Alto.  She likely needs to be on some form of antihypertensive medication.  Advised son to monitor blood pressures.  Goal is 130/80 or less consistently.  6.  Hyperlipidemia Continue simvastatin 20 mg daily p.o.  7.  CVA Continue Plavix 75 mg daily.  She has no focal neurological deficits from stroke.  Son states she may have some lingering memory deficit.  We need to clarify HCTZ doses to better control her blood  pressure for CVA prevention.  Son states he will clarify this with Dr. Quintin Alto.  Medication Adjustments/Labs and Tests Ordered: Current medicines are reviewed at length with the patient today.  Concerns regarding medicines are outlined above.   Disposition: Follow-up with Dr. Harl Bowie or APP 6 months  Signed, Levell July, NP 01/02/2021 4:41 PM    [facility] [place], [place], [place] [address] Phone: [phone]; Fax: [phone]

## 2021-01-03 ENCOUNTER — Encounter: Payer: Self-pay | Admitting: Family Medicine

## 2021-01-03 ENCOUNTER — Ambulatory Visit: Payer: Medicare Other | Admitting: Family Medicine

## 2021-01-03 ENCOUNTER — Telehealth: Payer: Self-pay | Admitting: Family Medicine

## 2021-01-03 VITALS — BP 142/82 | HR 78 | Ht 60.0 in | Wt 108.4 lb

## 2021-01-03 DIAGNOSIS — R0602 Shortness of breath: Secondary | ICD-10-CM | POA: Diagnosis not present

## 2021-01-03 DIAGNOSIS — E782 Mixed hyperlipidemia: Secondary | ICD-10-CM

## 2021-01-03 DIAGNOSIS — R002 Palpitations: Secondary | ICD-10-CM | POA: Diagnosis not present

## 2021-01-03 DIAGNOSIS — Z8719 Personal history of other diseases of the digestive system: Secondary | ICD-10-CM

## 2021-01-03 DIAGNOSIS — Z8673 Personal history of transient ischemic attack (TIA), and cerebral infarction without residual deficits: Secondary | ICD-10-CM

## 2021-01-03 DIAGNOSIS — R0789 Other chest pain: Secondary | ICD-10-CM | POA: Diagnosis not present

## 2021-01-03 DIAGNOSIS — I10 Essential (primary) hypertension: Secondary | ICD-10-CM | POA: Diagnosis not present

## 2021-01-03 NOTE — Telephone Encounter (Signed)
Noted.  Will make provider aware & remove from medication list.

## 2021-01-03 NOTE — Patient Instructions (Addendum)
Medication Instructions:  Continue all current medications.   Labwork: none  Testing/Procedures: none  Follow-Up: 6 months   Any Other Special Instructions Will Be Listed Below (If Applicable).   If you need a refill on your cardiac medications before your next appointment, please call your pharmacy.  

## 2021-01-03 NOTE — Telephone Encounter (Signed)
[name] Peggy Ramirez called stating that Dr. Quintin Alto took her off of the HYDROCHLOROTHIAZIDE PO TO:[id number]  12.5 mg

## 2021-01-16 DIAGNOSIS — N183 Chronic kidney disease, stage 3 unspecified: Secondary | ICD-10-CM | POA: Diagnosis not present

## 2021-01-16 DIAGNOSIS — I10 Essential (primary) hypertension: Secondary | ICD-10-CM | POA: Diagnosis not present

## 2021-01-16 DIAGNOSIS — K922 Gastrointestinal hemorrhage, unspecified: Secondary | ICD-10-CM | POA: Diagnosis not present

## 2021-01-16 DIAGNOSIS — D500 Iron deficiency anemia secondary to blood loss (chronic): Secondary | ICD-10-CM | POA: Diagnosis not present

## 2021-01-16 DIAGNOSIS — Z6820 Body mass index (BMI) 20.0-20.9, adult: Secondary | ICD-10-CM | POA: Diagnosis not present

## 2021-01-25 DIAGNOSIS — I129 Hypertensive chronic kidney disease with stage 1 through stage 4 chronic kidney disease, or unspecified chronic kidney disease: Secondary | ICD-10-CM | POA: Diagnosis not present

## 2021-01-25 DIAGNOSIS — K2100 Gastro-esophageal reflux disease with esophagitis, without bleeding: Secondary | ICD-10-CM | POA: Diagnosis not present

## 2021-01-25 DIAGNOSIS — E7849 Other hyperlipidemia: Secondary | ICD-10-CM | POA: Diagnosis not present

## 2021-01-25 DIAGNOSIS — N183 Chronic kidney disease, stage 3 unspecified: Secondary | ICD-10-CM | POA: Diagnosis not present

## 2021-01-28 ENCOUNTER — Ambulatory Visit: Payer: Medicare Other | Admitting: Cardiology

## 2021-02-07 DIAGNOSIS — E559 Vitamin D deficiency, unspecified: Secondary | ICD-10-CM | POA: Diagnosis not present

## 2021-02-07 DIAGNOSIS — K2100 Gastro-esophageal reflux disease with esophagitis, without bleeding: Secondary | ICD-10-CM | POA: Diagnosis not present

## 2021-02-07 DIAGNOSIS — N183 Chronic kidney disease, stage 3 unspecified: Secondary | ICD-10-CM | POA: Diagnosis not present

## 2021-02-07 DIAGNOSIS — E7800 Pure hypercholesterolemia, unspecified: Secondary | ICD-10-CM | POA: Diagnosis not present

## 2021-02-07 DIAGNOSIS — I10 Essential (primary) hypertension: Secondary | ICD-10-CM | POA: Diagnosis not present

## 2021-02-14 ENCOUNTER — Encounter: Payer: Self-pay | Admitting: *Deleted

## 2021-02-14 ENCOUNTER — Telehealth: Payer: Self-pay | Admitting: *Deleted

## 2021-02-14 NOTE — Telephone Encounter (Signed)
Laurine Blazer, LPN  46/21/9471  2:52 PM EDT Back to Top    Patient notified via detailed voice message & letter.  Copy to pcp.     Laurine Blazer, LPN  71/29/2909  0:30 PM EDT     No answer.    Verta Ellen., NP  01/23/2021  7:54 PM EDT     Pease call the patient and let her know the monitor showed some rare premature beats. No significant or sustained arrhythmias. Thank you    Verta Ellen, NP  01/23/2021 7:53 PM

## 2021-02-19 DIAGNOSIS — R5383 Other fatigue: Secondary | ICD-10-CM | POA: Diagnosis not present

## 2021-02-19 DIAGNOSIS — Z23 Encounter for immunization: Secondary | ICD-10-CM | POA: Diagnosis not present

## 2021-02-19 DIAGNOSIS — R350 Frequency of micturition: Secondary | ICD-10-CM | POA: Diagnosis not present

## 2021-02-19 DIAGNOSIS — E7849 Other hyperlipidemia: Secondary | ICD-10-CM | POA: Diagnosis not present

## 2021-02-19 DIAGNOSIS — I63511 Cerebral infarction due to unspecified occlusion or stenosis of right middle cerebral artery: Secondary | ICD-10-CM | POA: Diagnosis not present

## 2021-02-19 DIAGNOSIS — N183 Chronic kidney disease, stage 3 unspecified: Secondary | ICD-10-CM | POA: Diagnosis not present

## 2021-02-19 DIAGNOSIS — E213 Hyperparathyroidism, unspecified: Secondary | ICD-10-CM | POA: Diagnosis not present

## 2021-02-19 DIAGNOSIS — M3509 Sicca syndrome with other organ involvement: Secondary | ICD-10-CM | POA: Diagnosis not present

## 2021-03-15 DIAGNOSIS — M25561 Pain in right knee: Secondary | ICD-10-CM | POA: Diagnosis not present

## 2021-03-15 DIAGNOSIS — M25461 Effusion, right knee: Secondary | ICD-10-CM | POA: Diagnosis not present

## 2021-05-29 DIAGNOSIS — Z6820 Body mass index (BMI) 20.0-20.9, adult: Secondary | ICD-10-CM | POA: Diagnosis not present

## 2021-05-29 DIAGNOSIS — H6690 Otitis media, unspecified, unspecified ear: Secondary | ICD-10-CM | POA: Diagnosis not present

## 2021-05-29 DIAGNOSIS — R059 Cough, unspecified: Secondary | ICD-10-CM | POA: Diagnosis not present

## 2021-05-29 DIAGNOSIS — J320 Chronic maxillary sinusitis: Secondary | ICD-10-CM | POA: Diagnosis not present

## 2021-05-29 DIAGNOSIS — J069 Acute upper respiratory infection, unspecified: Secondary | ICD-10-CM | POA: Diagnosis not present

## 2021-06-18 DIAGNOSIS — E559 Vitamin D deficiency, unspecified: Secondary | ICD-10-CM | POA: Diagnosis not present

## 2021-06-18 DIAGNOSIS — E7849 Other hyperlipidemia: Secondary | ICD-10-CM | POA: Diagnosis not present

## 2021-06-18 DIAGNOSIS — I10 Essential (primary) hypertension: Secondary | ICD-10-CM | POA: Diagnosis not present

## 2021-06-18 DIAGNOSIS — K2100 Gastro-esophageal reflux disease with esophagitis, without bleeding: Secondary | ICD-10-CM | POA: Diagnosis not present

## 2021-06-18 DIAGNOSIS — E782 Mixed hyperlipidemia: Secondary | ICD-10-CM | POA: Diagnosis not present

## 2021-06-18 DIAGNOSIS — N183 Chronic kidney disease, stage 3 unspecified: Secondary | ICD-10-CM | POA: Diagnosis not present

## 2021-06-21 DIAGNOSIS — K0889 Other specified disorders of teeth and supporting structures: Secondary | ICD-10-CM | POA: Diagnosis not present

## 2021-06-21 DIAGNOSIS — M3509 Sicca syndrome with other organ involvement: Secondary | ICD-10-CM | POA: Diagnosis not present

## 2021-06-21 DIAGNOSIS — Z681 Body mass index (BMI) 19 or less, adult: Secondary | ICD-10-CM | POA: Diagnosis not present

## 2021-06-21 DIAGNOSIS — I63511 Cerebral infarction due to unspecified occlusion or stenosis of right middle cerebral artery: Secondary | ICD-10-CM | POA: Diagnosis not present

## 2021-06-21 DIAGNOSIS — E46 Unspecified protein-calorie malnutrition: Secondary | ICD-10-CM | POA: Diagnosis not present

## 2021-06-21 DIAGNOSIS — E7849 Other hyperlipidemia: Secondary | ICD-10-CM | POA: Diagnosis not present

## 2021-06-21 DIAGNOSIS — E213 Hyperparathyroidism, unspecified: Secondary | ICD-10-CM | POA: Diagnosis not present

## 2021-07-03 ENCOUNTER — Encounter: Payer: Self-pay | Admitting: Cardiology

## 2021-07-03 ENCOUNTER — Ambulatory Visit: Payer: Medicare Other | Admitting: Cardiology

## 2021-07-03 VITALS — BP 158/84 | HR 76 | Ht 60.0 in | Wt 106.6 lb

## 2021-07-03 DIAGNOSIS — E782 Mixed hyperlipidemia: Secondary | ICD-10-CM

## 2021-07-03 DIAGNOSIS — I10 Essential (primary) hypertension: Secondary | ICD-10-CM

## 2021-07-03 DIAGNOSIS — R0789 Other chest pain: Secondary | ICD-10-CM | POA: Diagnosis not present

## 2021-07-03 MED ORDER — AMLODIPINE BESYLATE 5 MG PO TABS: 5.0000 mg | ORAL_TABLET | Freq: Every day | ORAL | 3 refills | Status: DC

## 2021-07-03 NOTE — Progress Notes (Signed)
Clinical Summary Peggy Ramirez is a 88 y.o.female seen today for follow up of the following medical problems.    1. Chest pain - from previous clinic notes has history of chest pain and SOB, normal DSE in 11/2011 - 10/2014 Lexiscan showed no ischemia      10/2020 echo: LVEF 60-65%, no WMAs - from last PA note 12/2020 chest pain had improved on protonix.  - no recent symptoms.    2. HTN - reports pcp had stop HCTZ.      3. CVA -  admit 09/2015 with left hand weakness. MRI showed small cortical infarct in the posterior right MCA territory.  - 30 day event monitor without significant arrhythmias    4. HL - atorva caused muscle aches, symptoms improved after stopping about 1 month ago.  - currently on simvastatin and zetia '10mg'$   - labs followed by pcp  5.Palpitations 10/2020 monitor: Rare ectopy, rare SVT up to 20 beats.  - no recent symptoms.    Past Medical History:  Diagnosis Date   Acute metabolic encephalopathy    Chronic constipation    Chronic left lower quadrant pain    GERD (gastroesophageal reflux disease)    Headache(784.0)    High cholesterol    Hydrosalpinx    LEFT   Hypertension    Lupus (HCC)    Membranous glomerulonephritis    followed by Dr. Elige Radon   Ovarian fibroma    RIGHT   Pelvic adhesions    Sjogren's syndrome (HCC)    Stroke (HCC)      Allergies  Allergen Reactions   Atorvastatin Other (See Comments)    Muscle aches   Cellcept [Mycophenolate Mofetil] Other (See Comments)    unknown   Medrol [Methylprednisolone] Other (See Comments)    unknown   Mycophenolate Mofetil Other (See Comments)    unknown   Penicillins Itching   Sulfonamide Derivatives Nausea Only   Topiramate Other (See Comments)    unknown   Verapamil Other (See Comments)    Felt shakey   Levofloxacin Rash     Current Outpatient Medications  Medication Sig Dispense Refill   ALPRAZolam (XANAX) 0.25 MG tablet Take 0.25 mg by mouth at bedtime as needed for  anxiety.     Ascorbic Acid (VITAMIN C) 1000 MG tablet Take 1,000 mg by mouth daily.     Cholecalciferol (VITAMIN D-3 PO) Take 1 tablet by mouth daily.     clopidogrel (PLAVIX) 75 MG tablet Take 1 tablet (75 mg total) by mouth daily. 30 tablet 1   docusate sodium (COLACE) 100 MG capsule Take 1 capsule (100 mg total) by mouth 2 (two) times daily. 10 capsule 0   ezetimibe (ZETIA) 10 MG tablet Take 1 tablet (10 mg total) by mouth daily at 6 PM. 30 tablet 0   Misc. Devices (3-IN-1 BEDSIDE TOILET) MISC Patient needs 3 in 1 bedside toilet. Recent hip fracture with repair 1 each 1   Multiple Vitamin (MULTIVITAMIN) tablet Take 1 tablet by mouth daily.     pantoprazole (PROTONIX) 40 MG tablet Take 40 mg by mouth daily.     polyethylene glycol powder (GLYCOLAX/MIRALAX) 17 GM/SCOOP powder Take 17 g by mouth daily as needed for mild constipation. 255 g 0   simvastatin (ZOCOR) 20 MG tablet Take 20 mg by mouth daily.     traZODone (DESYREL) 50 MG tablet Take 1 tablet (50 mg total) by mouth at bedtime. 30 tablet 0   No current facility-administered medications  for this visit.     Past Surgical History:  Procedure Laterality Date   APPENDECTOMY  04/28/2000   BIOPSY  12/17/2020   Procedure: BIOPSY;  Surgeon: Eloise Harman, DO;  Location: [facility];  Service: Endoscopy;;   COLONOSCOPY WITH PROPOFOL N/A 12/17/2020   Procedure: COLONOSCOPY WITH PROPOFOL;  Surgeon: Eloise Harman, DO;  Location: [facility];  Service: Endoscopy;  Laterality: N/A;   FRACTURE SURGERY     HIP PINNING,CANNULATED Left 12/15/2019   Procedure: CANNULATED HIP PINNING;  Surgeon: Shona Needles, MD;  Location: [place];  Service: Orthopedics;  Laterality: Left;   LAPAROSCOPIC CHOLECYSTECTOMY  04/28/2000   TONSILLECTOMY  04/29/1939   TOTAL ABDOMINAL HYSTERECTOMY W/ BILATERAL SALPINGOOPHORECTOMY  04/29/2003   VAGINAL HYSTERECTOMY  04/29/1979     Allergies  Allergen Reactions   Atorvastatin Other (See Comments)    Muscle  aches   Cellcept [Mycophenolate Mofetil] Other (See Comments)    unknown   Medrol [Methylprednisolone] Other (See Comments)    unknown   Mycophenolate Mofetil Other (See Comments)    unknown   Penicillins Itching   Sulfonamide Derivatives Nausea Only   Topiramate Other (See Comments)    unknown   Verapamil Other (See Comments)    Felt shakey   Levofloxacin Rash      Family History  Problem Relation Age of Onset   Heart disease Other    Anxiety disorder Other    Depression Other      Social History Peggy Ramirez reports that she has never smoked. She has never used smokeless tobacco. Peggy Ramirez reports no history of alcohol use.   Review of Systems CONSTITUTIONAL: No weight loss, fever, chills, weakness or fatigue.  HEENT: Eyes: No visual loss, blurred vision, double vision or yellow sclerae.No hearing loss, sneezing, congestion, runny nose or sore throat.  SKIN: No rash or itching.  CARDIOVASCULAR: per hpi RESPIRATORY: No shortness of breath, cough or sputum.  GASTROINTESTINAL: No anorexia, nausea, vomiting or diarrhea. No abdominal pain or blood.  GENITOURINARY: No burning on urination, no polyuria NEUROLOGICAL: No headache, dizziness, syncope, paralysis, ataxia, numbness or tingling in the extremities. No change in bowel or bladder control.  MUSCULOSKELETAL: No muscle, back pain, joint pain or stiffness.  LYMPHATICS: No enlarged nodes. No history of splenectomy.  PSYCHIATRIC: No history of depression or anxiety.  ENDOCRINOLOGIC: No reports of sweating, cold or heat intolerance. No polyuria or polydipsia.  [name]   Physical Examination Today's Vitals   07/03/21 1053  BP: (!) 158/84  Pulse: 76  SpO2: 97%  Weight: 106 lb 9.6 oz (48.4 kg)  Height: 5' (1.524 m)   Body mass index is 20.82 kg/m.  Gen: resting comfortably, no acute distress HEENT: no scleral icterus, pupils equal round and reactive, no palptable cervical adenopathy,  CV: RRR, 2/6 systolic murmur  rusb Resp: Clear to auscultation bilaterally GI: abdomen is soft, non-tender, non-distended, normal bowel sounds, no hepatosplenomegaly MSK: extremities are warm, no edema.  Skin: warm, no rash Neuro:  no focal deficits Psych: appropriate affect   Diagnostic Studies  11/2011 DSE Baseline LVEF 60-65%, negative for ischemia   10/2014 Lexiscan MPI The study is normal. This is a low risk study. Nuclear stress EF: 60%. There was no ST segment deviation noted during stress.   09/2015 echo Study Conclusions   - Left ventricle: The cavity size was normal. There was focal basal   hypertrophy. Systolic function was normal. The estimated ejection   fraction was in the range  of 55% to 60%. Wall motion was normal;   there were no regional wall motion abnormalities. Doppler   parameters are consistent with abnormal left ventricular   relaxation (grade 1 diastolic dysfunction). - Aortic valve: Mildly calcified annulus. Trileaflet; normal   thickness leaflets. Valve area (VTI): 2.18 cm^2. Valve area   (Vmax): 2.24 cm^2. - Mitral valve: There was mild regurgitation. - Atrial septum: No defect or patent foramen ovale was identified. - Technically adequate study.   09/2015 Carotid US IMPRESSION: Mild amount of plaque at both carotid bifurcations, as above. Estimated bilateral ICA stenoses are less than 50%.  10/2020 echo IMPRESSIONS     1. Left ventricular ejection fraction, by estimation, is 60 to 65%. The  left ventricle has normal function. The left ventricle has no regional  wall motion abnormalities. There is mild left ventricular hypertrophy.  Left ventricular diastolic parameters  are indeterminate.   2. Right ventricular systolic function is normal. The right ventricular  size is normal. There is normal pulmonary artery systolic pressure. The  estimated right ventricular systolic pressure is 68.0 mmHg.   3. There is a trivial pericardial effusion that is circumferential.   4.  The mitral valve is grossly normal. No evidence of mitral valve  regurgitation.   5. The aortic valve is tricuspid. There is mild calcification of the  aortic valve. Aortic valve regurgitation is trivial.   6. The inferior vena cava is normal in size with greater than 50%  respiratory variability, suggesting right atrial pressure of 3 mmHg.   10/2020 monitor 12 day monitor Rare supraventricular ectopy in the form of isolated PACs, couplets, triplets. Rare episodes of SVT up to 20 beats Rare ventricular ectopy in the form of isolated PVCs, couplets Symptoms correlate with sinus rhythm and rare PACs/PVCs     Patch Wear Time:  12 days and 1 hours (2022-07-07T09:04:25-0400 to 2022-07-19T10:52:45-0400)   Patient had a min HR of 46 bpm, max HR of 193 bpm, and avg HR of 76 bpm. Predominant underlying rhythm was Sinus Rhythm. Bundle Sutter Ahlgren Block/IVCD was present. 14 Supraventricular Tachycardia runs occurred, the run with the fastest interval lasting 6  beats with a max rate of 193 bpm, the longest lasting 20 beats with an avg rate of 110 bpm. Isolated SVEs were rare (<1.0%), SVE Couplets were rare (<1.0%), and SVE Triplets were rare (<1.0%). Isolated VEs were rare (<1.0%), VE Couplets were rare  (<1.0%), and no VE Triplets were present. Ventricular Trigeminy was present.     Assessment and Plan  1. Chest pain - long history of symptoms with negative ischemic testing - pain recently has resolved on protonix, continue to monitor.    2. HTN - above goal, start amlodpine '5mg'$  daiy.     3. Hyperlipidemia -limited tolerance to statins, currently on simva '20mg'$  and zetia '10mg'$  - request labs from pcp   F/u 6 months      Arnoldo Lenis, M.D.

## 2021-07-03 NOTE — Patient Instructions (Signed)
Medication Instructions:  ?Your physician has recommended you make the following change in your medication:  ?Start Norvasc (atorvastatin) 5 mg once a day ?Continue all other medication as directed ? ?Labwork: ?none ? ?Testing/Procedures: ?none ? ?Follow-Up: ?Your physician recommends that you schedule a follow-up appointment in: 6 months ? ?Any Other Special Instructions Will Be Listed Below (If Applicable). ? ?If you need a refill on your cardiac medications before your next appointment, please call your pharmacy. ? ?

## 2021-07-26 DIAGNOSIS — E7849 Other hyperlipidemia: Secondary | ICD-10-CM | POA: Diagnosis not present

## 2021-07-26 DIAGNOSIS — N183 Chronic kidney disease, stage 3 unspecified: Secondary | ICD-10-CM | POA: Diagnosis not present

## 2021-07-26 DIAGNOSIS — I129 Hypertensive chronic kidney disease with stage 1 through stage 4 chronic kidney disease, or unspecified chronic kidney disease: Secondary | ICD-10-CM | POA: Diagnosis not present

## 2021-08-22 DIAGNOSIS — Z1231 Encounter for screening mammogram for malignant neoplasm of breast: Secondary | ICD-10-CM | POA: Diagnosis not present

## 2021-10-25 DIAGNOSIS — N183 Chronic kidney disease, stage 3 unspecified: Secondary | ICD-10-CM | POA: Diagnosis not present

## 2021-10-25 DIAGNOSIS — K2100 Gastro-esophageal reflux disease with esophagitis, without bleeding: Secondary | ICD-10-CM | POA: Diagnosis not present

## 2021-10-25 DIAGNOSIS — E7849 Other hyperlipidemia: Secondary | ICD-10-CM | POA: Diagnosis not present

## 2021-12-16 DIAGNOSIS — N183 Chronic kidney disease, stage 3 unspecified: Secondary | ICD-10-CM | POA: Diagnosis not present

## 2021-12-16 DIAGNOSIS — E7849 Other hyperlipidemia: Secondary | ICD-10-CM | POA: Diagnosis not present

## 2021-12-16 DIAGNOSIS — K2100 Gastro-esophageal reflux disease with esophagitis, without bleeding: Secondary | ICD-10-CM | POA: Diagnosis not present

## 2021-12-16 DIAGNOSIS — E559 Vitamin D deficiency, unspecified: Secondary | ICD-10-CM | POA: Diagnosis not present

## 2021-12-16 DIAGNOSIS — I10 Essential (primary) hypertension: Secondary | ICD-10-CM | POA: Diagnosis not present

## 2021-12-20 DIAGNOSIS — M545 Low back pain, unspecified: Secondary | ICD-10-CM | POA: Diagnosis not present

## 2021-12-20 DIAGNOSIS — R252 Cramp and spasm: Secondary | ICD-10-CM | POA: Diagnosis not present

## 2021-12-20 DIAGNOSIS — K0889 Other specified disorders of teeth and supporting structures: Secondary | ICD-10-CM | POA: Diagnosis not present

## 2021-12-20 DIAGNOSIS — D72819 Decreased white blood cell count, unspecified: Secondary | ICD-10-CM | POA: Diagnosis not present

## 2021-12-20 DIAGNOSIS — E7849 Other hyperlipidemia: Secondary | ICD-10-CM | POA: Diagnosis not present

## 2021-12-20 DIAGNOSIS — E46 Unspecified protein-calorie malnutrition: Secondary | ICD-10-CM | POA: Diagnosis not present

## 2021-12-20 DIAGNOSIS — Z23 Encounter for immunization: Secondary | ICD-10-CM | POA: Diagnosis not present

## 2021-12-20 DIAGNOSIS — S72002A Fracture of unspecified part of neck of left femur, initial encounter for closed fracture: Secondary | ICD-10-CM | POA: Diagnosis not present

## 2021-12-20 DIAGNOSIS — G8194 Hemiplegia, unspecified affecting left nondominant side: Secondary | ICD-10-CM | POA: Diagnosis not present

## 2021-12-20 DIAGNOSIS — M3509 Sicca syndrome with other organ involvement: Secondary | ICD-10-CM | POA: Diagnosis not present

## 2021-12-20 DIAGNOSIS — R5383 Other fatigue: Secondary | ICD-10-CM | POA: Diagnosis not present

## 2022-01-06 ENCOUNTER — Ambulatory Visit: Payer: Medicare Other | Attending: Cardiology | Admitting: Cardiology

## 2022-01-06 ENCOUNTER — Encounter: Payer: Self-pay | Admitting: Cardiology

## 2022-01-06 VITALS — BP 120/78 | HR 73 | Ht 59.0 in | Wt 104.0 lb

## 2022-01-06 DIAGNOSIS — R0789 Other chest pain: Secondary | ICD-10-CM

## 2022-01-06 DIAGNOSIS — I10 Essential (primary) hypertension: Secondary | ICD-10-CM

## 2022-01-06 DIAGNOSIS — E782 Mixed hyperlipidemia: Secondary | ICD-10-CM

## 2022-01-06 NOTE — Progress Notes (Signed)
Clinical Summary Peggy Ramirez is a 88 y.o.female seen today for follow up of the following medical problems.    1. Chest pain - from previous clinic notes has history of chest pain and SOB, normal DSE in 11/2011 - 10/2014 Lexiscan showed no ischemia       10/2020 echo: LVEF 60-65%, no WMAs - from last PA note 12/2020 chest pain had improved on protonix.  -still with some chronic pains at times   2. HTN - reports pcp had stop HCTZ. -on norvasc '5mg'$  daily, compliant     3. CVA -  admit 09/2015 with left hand weakness. MRI showed small cortical infarct in the posterior right MCA territory.  - 30 day event monitor without significant arrhythmias     4. HL - atorva caused muscle aches, symptoms improved after stopping about 1 month ago.  - currently on simvastatin and zetia '10mg'$   - labs followed by pcp  05/2021 TC 133 TG 99 HDL 60 LDL 55   5.Palpitations 10/2020 monitor: Rare ectopy, rare SVT up to 20 beats.  - no recent symptoms Past Medical History:  Diagnosis Date   Acute metabolic encephalopathy    Chronic constipation    Chronic left lower quadrant pain    GERD (gastroesophageal reflux disease)    Headache(784.0)    High cholesterol    Hydrosalpinx    LEFT   Hypertension    Lupus (HCC)    Membranous glomerulonephritis    followed by Dr. Elige Radon   Ovarian fibroma    RIGHT   Pelvic adhesions    Sjogren's syndrome (HCC)    Stroke (HCC)      Allergies  Allergen Reactions   Atorvastatin Other (See Comments)    Muscle aches   Cellcept [Mycophenolate Mofetil] Other (See Comments)    unknown   Medrol [Methylprednisolone] Other (See Comments)    unknown   Mycophenolate Mofetil Other (See Comments)    unknown   Penicillins Itching   Sulfonamide Derivatives Nausea Only   Topiramate Other (See Comments)    unknown   Verapamil Other (See Comments)    Felt shakey   Levofloxacin Rash     Current Outpatient Medications  Medication Sig Dispense Refill    ALPRAZolam (XANAX) 0.25 MG tablet Take 0.25 mg by mouth at bedtime as needed for anxiety.     amLODipine (NORVASC) 5 MG tablet Take 1 tablet (5 mg total) by mouth daily. 90 tablet 3   Ascorbic Acid (VITAMIN C) 1000 MG tablet Take 1,000 mg by mouth daily.     clopidogrel (PLAVIX) 75 MG tablet Take 1 tablet (75 mg total) by mouth daily. 30 tablet 1   docusate sodium (COLACE) 100 MG capsule Take 100 mg by mouth daily.     ezetimibe (ZETIA) 10 MG tablet Take 1 tablet (10 mg total) by mouth daily at 6 PM. 30 tablet 0   Misc. Devices (3-IN-1 BEDSIDE TOILET) MISC Patient needs 3 in 1 bedside toilet. Recent hip fracture with repair 1 each 1   Multiple Vitamin (MULTIVITAMIN) tablet Take 1 tablet by mouth daily.     pantoprazole (PROTONIX) 40 MG tablet Take 40 mg by mouth daily.     polyethylene glycol powder (GLYCOLAX/MIRALAX) 17 GM/SCOOP powder Take 17 g by mouth daily as needed for mild constipation. 255 g 0   simvastatin (ZOCOR) 20 MG tablet Take 20 mg by mouth daily.     traZODone (DESYREL) 50 MG tablet Take 75 mg by  mouth at bedtime.     No current facility-administered medications for this visit.     Past Surgical History:  Procedure Laterality Date   APPENDECTOMY  04/28/2000   BIOPSY  12/17/2020   Procedure: BIOPSY;  Surgeon: Eloise Harman, DO;  [facility]: [facility];  Service: Endoscopy;;   COLONOSCOPY WITH PROPOFOL N/A 12/17/2020   Procedure: COLONOSCOPY WITH PROPOFOL;  Surgeon: Eloise Harman, DO;  [facility]: [facility];  Service: Endoscopy;  Laterality: N/A;   FRACTURE SURGERY     HIP PINNING,CANNULATED Left 12/15/2019   Procedure: CANNULATED HIP PINNING;  Surgeon: Shona Needles, MD;  Location: [place];  Service: Orthopedics;  Laterality: Left;   LAPAROSCOPIC CHOLECYSTECTOMY  04/28/2000   TONSILLECTOMY  04/29/1939   TOTAL ABDOMINAL HYSTERECTOMY W/ BILATERAL SALPINGOOPHORECTOMY  04/29/2003   VAGINAL HYSTERECTOMY  04/29/1979     Allergies  Allergen Reactions    Atorvastatin Other (See Comments)    Muscle aches   Cellcept [Mycophenolate Mofetil] Other (See Comments)    unknown   Medrol [Methylprednisolone] Other (See Comments)    unknown   Mycophenolate Mofetil Other (See Comments)    unknown   Penicillins Itching   Sulfonamide Derivatives Nausea Only   Topiramate Other (See Comments)    unknown   Verapamil Other (See Comments)    Felt shakey   Levofloxacin Rash      Family History  Problem Relation Age of Onset   Heart disease Other    Anxiety disorder Other    Depression Other      Social History Peggy Ramirez reports that she has never smoked. She has never used smokeless tobacco. Peggy Ramirez reports no history of alcohol use.   Review of Systems CONSTITUTIONAL: No weight loss, fever, chills, weakness or fatigue.  HEENT: Eyes: No visual loss, blurred vision, double vision or yellow sclerae.No hearing loss, sneezing, congestion, runny nose or sore throat.  SKIN: No rash or itching.  CARDIOVASCULAR: per hpi RESPIRATORY: No shortness of breath, cough or sputum.  GASTROINTESTINAL: No anorexia, nausea, vomiting or diarrhea. No abdominal pain or blood.  GENITOURINARY: No burning on urination, no polyuria NEUROLOGICAL: No headache, dizziness, syncope, paralysis, ataxia, numbness or tingling in the extremities. No change in bowel or bladder control.  MUSCULOSKELETAL: No muscle, back pain, joint pain or stiffness.  LYMPHATICS: No enlarged nodes. No history of splenectomy.  PSYCHIATRIC: No history of depression or anxiety.  ENDOCRINOLOGIC: No reports of sweating, cold or heat intolerance. No polyuria or polydipsia.  [name]   Physical Examination Today's Vitals   01/06/22 1126  BP: 120/78  Pulse: 73  SpO2: 97%  Weight: 104 lb (47.2 kg)  Height: '4\' 11"'$  (1.499 m)   Body mass index is 21.01 kg/m.  Gen: resting comfortably, no acute distress HEENT: no scleral icterus, pupils equal round and reactive, no palptable cervical adenopathy,   CV: RRR, no m/r/g, no jvd Resp: Clear to auscultation bilaterally GI: abdomen is soft, non-tender, non-distended, normal bowel sounds, no hepatosplenomegaly MSK: extremities are warm, no edema.  Skin: warm, no rash Neuro:  no focal deficits Psych: appropriate affect   Diagnostic Studies  11/2011 DSE Baseline LVEF 60-65%, negative for ischemia   10/2014 Lexiscan MPI The study is normal. This is a low risk study. Nuclear stress EF: 60%. There was no ST segment deviation noted during stress.   09/2015 echo Study Conclusions   - Left ventricle: The cavity size was normal. There was focal basal   hypertrophy. Systolic function was normal. The  estimated ejection   fraction was in the range of 55% to 60%. Wall motion was normal;   there were no regional wall motion abnormalities. Doppler   parameters are consistent with abnormal left ventricular   relaxation (grade 1 diastolic dysfunction). - Aortic valve: Mildly calcified annulus. Trileaflet; normal   thickness leaflets. Valve area (VTI): 2.18 cm^2. Valve area   (Vmax): 2.24 cm^2. - Mitral valve: There was mild regurgitation. - Atrial septum: No defect or patent foramen ovale was identified. - Technically adequate study.   09/2015 Carotid US IMPRESSION: Mild amount of plaque at both carotid bifurcations, as above. Estimated bilateral ICA stenoses are less than 50%.   10/2020 echo IMPRESSIONS     1. Left ventricular ejection fraction, by estimation, is 60 to 65%. The  left ventricle has normal function. The left ventricle has no regional  wall motion abnormalities. There is mild left ventricular hypertrophy.  Left ventricular diastolic parameters  are indeterminate.   2. Right ventricular systolic function is normal. The right ventricular  size is normal. There is normal pulmonary artery systolic pressure. The  estimated right ventricular systolic pressure is 24.5 mmHg.   3. There is a trivial pericardial effusion that is  circumferential.   4. The mitral valve is grossly normal. No evidence of mitral valve  regurgitation.   5. The aortic valve is tricuspid. There is mild calcification of the  aortic valve. Aortic valve regurgitation is trivial.   6. The inferior vena cava is normal in size with greater than 50%  respiratory variability, suggesting right atrial pressure of 3 mmHg.    10/2020 monitor 12 day monitor Rare supraventricular ectopy in the form of isolated PACs, couplets, triplets. Rare episodes of SVT up to 20 beats Rare ventricular ectopy in the form of isolated PVCs, couplets Symptoms correlate with sinus rhythm and rare PACs/PVCs     Patch Wear Time:  12 days and 1 hours (2022-07-07T09:04:25-0400 to 2022-07-19T10:52:45-0400)   Patient had a min HR of 46 bpm, max HR of 193 bpm, and avg HR of 76 bpm. Predominant underlying rhythm was Sinus Rhythm. Bundle Layson Bertsch Block/IVCD was present. 14 Supraventricular Tachycardia runs occurred, the run with the fastest interval lasting 6  beats with a max rate of 193 bpm, the longest lasting 20 beats with an avg rate of 110 bpm. Isolated SVEs were rare (<1.0%), SVE Couplets were rare (<1.0%), and SVE Triplets were rare (<1.0%). Isolated VEs were rare (<1.0%), VE Couplets were rare  (<1.0%), and no VE Triplets were present. Ventricular Trigeminy was present.     Assessment and Plan  1. Chest pain - long history of symptoms with negative ischemic testing - pain recently has resolved on protonix - no further cardiac testing at this time, continue to monitor   2. HTN - at goal, continue curren tmeds    3. Hyperlipidemia -limited tolerance to statins, currently on simva '20mg'$  and zetia '10mg'$  - at goal, continue current meds  EKG SR, no acute ischemic changes   F/u 1 year   Arnoldo Lenis, M.D.

## 2022-01-06 NOTE — Patient Instructions (Signed)

## 2022-01-07 ENCOUNTER — Encounter: Payer: Self-pay | Admitting: *Deleted

## 2022-02-25 DIAGNOSIS — E782 Mixed hyperlipidemia: Secondary | ICD-10-CM | POA: Diagnosis not present

## 2022-02-25 DIAGNOSIS — I10 Essential (primary) hypertension: Secondary | ICD-10-CM | POA: Diagnosis not present

## 2022-02-25 DIAGNOSIS — K2100 Gastro-esophageal reflux disease with esophagitis, without bleeding: Secondary | ICD-10-CM | POA: Diagnosis not present

## 2022-02-28 ENCOUNTER — Other Ambulatory Visit: Payer: Self-pay | Admitting: Cardiology

## 2022-03-04 DIAGNOSIS — Z23 Encounter for immunization: Secondary | ICD-10-CM | POA: Diagnosis not present

## 2022-04-03 DIAGNOSIS — H16223 Keratoconjunctivitis sicca, not specified as Sjogren's, bilateral: Secondary | ICD-10-CM | POA: Diagnosis not present

## 2022-04-03 DIAGNOSIS — H2513 Age-related nuclear cataract, bilateral: Secondary | ICD-10-CM | POA: Diagnosis not present

## 2022-04-03 DIAGNOSIS — M3500 Sicca syndrome, unspecified: Secondary | ICD-10-CM | POA: Diagnosis not present

## 2022-04-28 HISTORY — PX: EYE SURGERY: SHX253

## 2022-06-02 DIAGNOSIS — H01002 Unspecified blepharitis right lower eyelid: Secondary | ICD-10-CM | POA: Diagnosis not present

## 2022-06-02 DIAGNOSIS — H2513 Age-related nuclear cataract, bilateral: Secondary | ICD-10-CM | POA: Diagnosis not present

## 2022-06-02 DIAGNOSIS — H01004 Unspecified blepharitis left upper eyelid: Secondary | ICD-10-CM | POA: Diagnosis not present

## 2022-06-02 DIAGNOSIS — H01001 Unspecified blepharitis right upper eyelid: Secondary | ICD-10-CM | POA: Diagnosis not present

## 2022-06-05 DIAGNOSIS — H2512 Age-related nuclear cataract, left eye: Secondary | ICD-10-CM | POA: Diagnosis not present

## 2022-06-06 MED ORDER — LIDOCAINE HCL 3.5 % OP GEL
1.0000 | Freq: Once | OPHTHALMIC | Status: AC
  Administered 2022-06-13: 1 via OPHTHALMIC

## 2022-06-06 MED ORDER — TROPICAMIDE 1 % OP SOLN
1.0000 [drp] | OPHTHALMIC | Status: AC | PRN
  Administered 2022-06-13 (×3): 1 [drp] via OPHTHALMIC

## 2022-06-06 MED ORDER — PHENYLEPHRINE HCL 2.5 % OP SOLN
1.0000 [drp] | OPHTHALMIC | Status: AC | PRN
  Administered 2022-06-13 (×3): 1 [drp] via OPHTHALMIC

## 2022-06-06 MED ORDER — TETRACAINE HCL 0.5 % OP SOLN
1.0000 [drp] | OPHTHALMIC | Status: AC | PRN
  Administered 2022-06-13 (×3): 1 [drp] via OPHTHALMIC

## 2022-06-06 NOTE — H&P (Signed)
Surgical History & Physical  Patient Name: Peggy Ramirez DOB: 03-10-1934  Surgery: Cataract extraction with intraocular lens implant phacoemulsification; Left Eye  Surgeon: Baruch Goldmann MD Surgery Date:  06-13-22 Pre-Op Date:  06-02-22  HPI: A 29 Yr. old female patient is referred by Dr Hassell Done for cataract eval. 1. 1. The patient complains of difficulty when recognizing people, which began many years ago. Both eyes are affected. The episode is gradual. The condition's severity increased since last visit. Symptoms occur when the patient is inside, outside and reading. The complaint is associated with glare. This is negatively affecting the patient's quality of life and the patient is unable to function adequately in life with the current level of vision. HPI was performed by Baruch Goldmann .  Medical History: Dry Eyes Cataracts High Blood Pressure Stroke  Review of Systems Negative Allergic/Immunologic Negative Cardiovascular Negative Constitutional Negative Ear, Nose, Mouth & Throat Negative Endocrine Negative Eyes Negative Gastrointestinal Negative Genitourinary Negative Hemotologic/Lymphatic Negative Integumentary Negative Musculoskeletal Negative Neurological Negative Psychiatry Negative Respiratory  Social   Never smoked   Medication  Cyclobenzaprine, Dexamethasone, Pantoprazole, Potassium chloride, Colace, Vitamin C, Clopidogrel, Amlodipine, Alprazolam, Ezetimibe, Simvastatin, Trazodone, Multivitamin,   Sx/Procedures   None  Drug Allergies  Peniciilin,   History & Physical: Heent: cataract, left eye NECK: supple without bruits LUNGS: lungs clear to auscultation CV: regular rate and rhythm Abdomen: soft and non-tender Impression & Plan: Assessment: 1.  NUCLEAR SCLEROSIS AGE RELATED; Both Eyes (H25.13) 2.  BLEPHARITIS; Right Upper Lid, Right Lower Lid, Left Upper Lid, Left Lower Lid (H01.001, H01.002,H01.004,H01.005) 3.  LID CYSTS; Right Lower Lid  (H02.822) 4.  DERMATOCHALASIS, no surgery; Right Upper Lid, Left Upper Lid (H02.831, H02.834) 5.  ASTIGMATISM, REGULAR; Both Eyes (H52.223)  Plan: 1.  Cataract accounts for the patient's decreased vision. This visual impairment is not correctable with a tolerable change in glasses or contact lenses. Cataract surgery with an implantation of a new lens should significantly improve the visual and functional status of the patient. Discussed all risks, benefits, alternatives, and potential complications. Discussed the procedures and recovery. Patient desires to have surgery. A-scan ordered and performed today for intra-ocular lens calculations. The surgery will be performed in order to improve vision for driving, reading, and for eye examinations. Recommend phacoemulsification with intra-ocular lens. Recommend Dextenza for post-operative pain and inflammation. Left Eye worse - first. Dilates poorly - shugarcaine by protocol. Malyugin Ring. Omidira. Recommend Toric Lens.  2.  Recommend regular lid cleaning.  3.  Findings, prognosis and treatment options reviewed.  4.  Asymptomatic, recommend observation for now. Findings, prognosis and treatment options reviewed.  5.  Recommend Toric IOL OU.

## 2022-06-09 ENCOUNTER — Encounter (HOSPITAL_COMMUNITY): Payer: Self-pay

## 2022-06-10 ENCOUNTER — Encounter (HOSPITAL_COMMUNITY)
Admission: RE | Admit: 2022-06-10 | Discharge: 2022-06-10 | Disposition: A | Discharge status: Home / Self Care | Payer: Medicare Other | Source: Ambulatory Visit | Attending: Ophthalmology | Admitting: Ophthalmology

## 2022-06-10 DIAGNOSIS — E7849 Other hyperlipidemia: Secondary | ICD-10-CM | POA: Diagnosis not present

## 2022-06-10 DIAGNOSIS — I10 Essential (primary) hypertension: Secondary | ICD-10-CM | POA: Diagnosis not present

## 2022-06-10 DIAGNOSIS — K2100 Gastro-esophageal reflux disease with esophagitis, without bleeding: Secondary | ICD-10-CM | POA: Diagnosis not present

## 2022-06-10 DIAGNOSIS — R5383 Other fatigue: Secondary | ICD-10-CM | POA: Diagnosis not present

## 2022-06-10 DIAGNOSIS — D72819 Decreased white blood cell count, unspecified: Secondary | ICD-10-CM | POA: Diagnosis not present

## 2022-06-10 DIAGNOSIS — E559 Vitamin D deficiency, unspecified: Secondary | ICD-10-CM | POA: Diagnosis not present

## 2022-06-13 ENCOUNTER — Ambulatory Visit (HOSPITAL_BASED_OUTPATIENT_CLINIC_OR_DEPARTMENT_OTHER): Payer: Medicare Other | Admitting: Certified Registered Nurse Anesthetist

## 2022-06-13 ENCOUNTER — Encounter (HOSPITAL_COMMUNITY)
Admission: RE | Disposition: A | Discharge status: Home / Self Care | Payer: Self-pay | Source: Ambulatory Visit | Attending: Ophthalmology

## 2022-06-13 ENCOUNTER — Encounter (HOSPITAL_COMMUNITY): Payer: Self-pay | Admitting: Ophthalmology

## 2022-06-13 ENCOUNTER — Ambulatory Visit (HOSPITAL_COMMUNITY)
Admission: RE | Admit: 2022-06-13 | Discharge: 2022-06-13 | Disposition: A | Discharge status: Home / Self Care | Payer: Medicare Other | Source: Ambulatory Visit | Attending: Ophthalmology | Admitting: Ophthalmology

## 2022-06-13 ENCOUNTER — Ambulatory Visit (HOSPITAL_COMMUNITY): Payer: Medicare Other | Admitting: Certified Registered Nurse Anesthetist

## 2022-06-13 DIAGNOSIS — H2512 Age-related nuclear cataract, left eye: Secondary | ICD-10-CM | POA: Diagnosis not present

## 2022-06-13 DIAGNOSIS — H02822 Cysts of right lower eyelid: Secondary | ICD-10-CM | POA: Diagnosis not present

## 2022-06-13 DIAGNOSIS — H0100A Unspecified blepharitis right eye, upper and lower eyelids: Secondary | ICD-10-CM | POA: Insufficient documentation

## 2022-06-13 DIAGNOSIS — H0100B Unspecified blepharitis left eye, upper and lower eyelids: Secondary | ICD-10-CM | POA: Diagnosis not present

## 2022-06-13 DIAGNOSIS — H52223 Regular astigmatism, bilateral: Secondary | ICD-10-CM | POA: Diagnosis not present

## 2022-06-13 DIAGNOSIS — H02834 Dermatochalasis of left upper eyelid: Secondary | ICD-10-CM | POA: Diagnosis not present

## 2022-06-13 DIAGNOSIS — H2181 Floppy iris syndrome: Secondary | ICD-10-CM

## 2022-06-13 DIAGNOSIS — H02831 Dermatochalasis of right upper eyelid: Secondary | ICD-10-CM | POA: Diagnosis not present

## 2022-06-13 DIAGNOSIS — I10 Essential (primary) hypertension: Secondary | ICD-10-CM | POA: Diagnosis not present

## 2022-06-13 DIAGNOSIS — K219 Gastro-esophageal reflux disease without esophagitis: Secondary | ICD-10-CM | POA: Insufficient documentation

## 2022-06-13 DIAGNOSIS — N289 Disorder of kidney and ureter, unspecified: Secondary | ICD-10-CM | POA: Diagnosis not present

## 2022-06-13 DIAGNOSIS — H5712 Ocular pain, left eye: Secondary | ICD-10-CM

## 2022-06-13 DIAGNOSIS — Z8673 Personal history of transient ischemic attack (TIA), and cerebral infarction without residual deficits: Secondary | ICD-10-CM | POA: Insufficient documentation

## 2022-06-13 SURGERY — CATARACT EXTRACTION PHACO AND INTRAOCULAR LENS PLACEMENT (IOC) with placement of Corticosteroid
Anesthesia: Monitor Anesthesia Care | Site: Eye | Laterality: Left

## 2022-06-13 MED ORDER — MOXIFLOXACIN HCL 5 MG/ML IO SOLN
INTRAOCULAR | Status: AC
  Filled 2022-06-13: qty 1

## 2022-06-13 MED ORDER — MIDAZOLAM HCL 2 MG/2ML IJ SOLN
INTRAMUSCULAR | Status: AC
  Filled 2022-06-13: qty 2

## 2022-06-13 MED ORDER — DEXAMETHASONE 0.4 MG OP INST
VAGINAL_INSERT | OPHTHALMIC | Status: AC
  Filled 2022-06-13: qty 1

## 2022-06-13 MED ORDER — PHENYLEPHRINE-KETOROLAC 1-0.3 % IO SOLN
INTRAOCULAR | Status: DC | PRN
  Administered 2022-06-13: 500 mL via OPHTHALMIC

## 2022-06-13 MED ORDER — PHENYLEPHRINE-KETOROLAC 1-0.3 % IO SOLN
INTRAOCULAR | Status: AC
  Filled 2022-06-13: qty 4

## 2022-06-13 MED ORDER — MIDAZOLAM HCL 5 MG/5ML IJ SOLN
INTRAMUSCULAR | Status: DC | PRN
  Administered 2022-06-13: .5 mg via INTRAVENOUS

## 2022-06-13 MED ORDER — EPINEPHRINE PF 1 MG/ML IJ SOLN
INTRAMUSCULAR | Status: AC
  Filled 2022-06-13: qty 1

## 2022-06-13 MED ORDER — DEXAMETHASONE 0.4 MG OP INST
VAGINAL_INSERT | OPHTHALMIC | Status: DC | PRN
  Administered 2022-06-13: .4 mg via OPHTHALMIC

## 2022-06-13 MED ORDER — SODIUM HYALURONATE 23MG/ML IO SOSY
PREFILLED_SYRINGE | INTRAOCULAR | Status: DC | PRN
  Administered 2022-06-13: .6 mL via INTRAOCULAR

## 2022-06-13 MED ORDER — SODIUM HYALURONATE 10 MG/ML IO SOLUTION
PREFILLED_SYRINGE | INTRAOCULAR | Status: DC | PRN
  Administered 2022-06-13: .85 mL via INTRAOCULAR

## 2022-06-13 MED ORDER — LIDOCAINE HCL (PF) 1 % IJ SOLN
INTRAOCULAR | Status: DC | PRN
  Administered 2022-06-13: 1 mL via OPHTHALMIC

## 2022-06-13 MED ORDER — STERILE WATER FOR IRRIGATION IR SOLN
Status: DC | PRN
  Administered 2022-06-13: 250 mL

## 2022-06-13 MED ORDER — BSS IO SOLN
INTRAOCULAR | Status: DC | PRN
  Administered 2022-06-13: 15 mL via INTRAOCULAR

## 2022-06-13 MED ORDER — POVIDONE-IODINE 5 % OP SOLN
OPHTHALMIC | Status: DC | PRN
  Administered 2022-06-13: 1 via OPHTHALMIC

## 2022-06-13 SURGICAL SUPPLY — 17 items
BLADE CATARACT (BLADE) IMPLANT
CATARACT SUITE SIGHTPATH (MISCELLANEOUS) ×1 IMPLANT
CLOTH BEACON ORANGE TIMEOUT ST (SAFETY) ×1 IMPLANT
EYE SHIELD UNIVERSAL CLEAR (GAUZE/BANDAGES/DRESSINGS) IMPLANT
FEE CATARACT SUITE SIGHTPATH (MISCELLANEOUS) ×1 IMPLANT
GLOVE BIOGEL PI IND STRL 6.5 (GLOVE) IMPLANT
GLOVE BIOGEL PI IND STRL 7.0 (GLOVE) ×2 IMPLANT
LENS IOL RAYNER 24.5 (Intraocular Lens) ×1 IMPLANT
LENS IOL RAYONE EMV 24.5 (Intraocular Lens) IMPLANT
NDL HYPO 18GX1.5 BLUNT FILL (NEEDLE) ×1 IMPLANT
NEEDLE HYPO 18GX1.5 BLUNT FILL (NEEDLE) ×1 IMPLANT
PAD ARMBOARD 7.5X6 YLW CONV (MISCELLANEOUS) ×1 IMPLANT
RING MALYGIN 7.0 (MISCELLANEOUS) IMPLANT
SYR TB 1ML LL NO SAFETY (SYRINGE) ×1 IMPLANT
TAPE SURG TRANSPORE 1 IN (GAUZE/BANDAGES/DRESSINGS) IMPLANT
TAPE SURGICAL TRANSPORE 1 IN (GAUZE/BANDAGES/DRESSINGS) ×1
WATER STERILE IRR 250ML POUR (IV SOLUTION) ×1 IMPLANT

## 2022-06-13 NOTE — Transfer of Care (Signed)
Immediate Anesthesia Transfer of Care Note  Patient: Peggy Ramirez  Procedure(s) Performed: CATARACT EXTRACTION PHACO AND INTRAOCULAR LENS PLACEMENT (IOC) with placement of Corticosteroid (Left: Eye)  Patient Location: PACU  Anesthesia Type:MAC  Level of Consciousness: awake, alert , and oriented  Airway & Oxygen Therapy: Patient Spontanous Breathing  Post-op Assessment: Report given to RN and Post -op Vital signs reviewed and stable  Post vital signs: Reviewed and stable  Last Vitals:  Vitals Value Taken Time  BP    Temp    Pulse    Resp    SpO2      Last Pain:  Vitals:   06/13/22 0933  TempSrc: Oral         Complications: No notable events documented.

## 2022-06-13 NOTE — Op Note (Signed)
Date of procedure: 06/13/22  Pre-operative diagnosis: Visually significant age-related nuclear cataract, Left Eye (H25.12), Poor dilation, left eye  Post-operative diagnosis:  Visually significant age-related nuclear cataract, Left Eye (H25.12) 2.  Intra-operative floppy iris syndrome, left eye 3. Pain and inflammation following cataract surgery, Left Eye (H57.12)  Procedure:  Removal of cataract via phacoemulsification and insertion of intra-ocular lens Rayner RAO200E +24.5D into the capsular bag of the Left Eye, Complex 2. Placement of Dextenza Implant, Left Lower Lid  Attending surgeon: Gerda Diss. Adileny Delon, MD, MA  Anesthesia: MAC, Topical Akten  Complications: None  Estimated Blood Loss: <485m (minimal)  Specimens: None  Implants: As above  Indications:  Visually significant age-related cataract, Left Eye  Procedure:  The patient was seen and identified in the pre-operative area. The operative eye was identified and dilated.  The operative eye was marked.  Topical anesthesia was administered to the operative eye.     The patient was then to the operative suite and placed in the supine position.  A timeout was performed confirming the patient, procedure to be performed, and all other relevant information.   The patient's face was prepped and draped in the usual fashion for intra-ocular surgery.  A lid speculum was placed into the operative eye and the surgical microscope moved into place and focused.  An inferotemporal paracentesis was created using a 20 gauge paracentesis blade.  Shugarcaine was injected into the anterior chamber.  Viscoelastic was injected into the anterior chamber.  A temporal clear-corneal main wound incision was created using a 2.454mmicrokeratome. A 7.85m25malyugin ring was placed. A continuous curvilinear capsulorrhexis was initiated using an irrigating cystitome and completed using capsulorrhexis forceps.  Hydrodissection and hydrodeliniation were performed.   Viscoelastic was injected into the anterior chamber.  A phacoemulsification handpiece and a chopper as a second instrument were used to remove the nucleus and epinucleus. The irrigation/aspiration handpiece was used to remove any remaining cortical material.   The capsular bag was reinflated with viscoelastic, checked, and found to be intact.  The intraocular lens was inserted into the capsular bag. The Malyugin ring was removed. The irrigation/aspiration handpiece was used to remove any remaining viscoelastic.  The clear corneal wound and paracentesis wounds were then hydrated and checked with Weck-Cels to be watertight.  The lid-speculum was removed. The lower punctum was dilated. A Dextenza implant was placed in the lower canaliculus without complication.   The drape was removed.  The patient's face was cleaned with a wet and dry 4x4.   A clear shield was taped over the eye. The patient was taken to the post-operative care unit in good condition, having tolerated the procedure well.  Post-Op Instructions: The patient will follow up at [facility] a same day post-operative evaluation and will receive all other orders and instructions.

## 2022-06-13 NOTE — Interval H&P Note (Signed)
History and Physical Interval Note:  06/13/2022 9:41 AM  Peggy Ramirez  has presented today for surgery, with the diagnosis of nuclear sclerosis age related cataract; left ocular pain and inflammation; left.  The various methods of treatment have been discussed with the patient and family. After consideration of risks, benefits and other options for treatment, the patient has consented to  Procedure(s) with comments: CATARACT EXTRACTION PHACO AND INTRAOCULAR LENS PLACEMENT ([place]) with placement of Corticosteroid (Left) - CDE as a surgical intervention.  The patient's history has been reviewed, patient examined, no change in status, stable for surgery.  I have reviewed the patient's chart and labs.  Questions were answered to the patient's satisfaction.     Baruch Goldmann

## 2022-06-13 NOTE — Discharge Instructions (Signed)
Please discharge patient when stable, will follow up today with Dr. Arling Cerone at the [facility] [place] office immediately following discharge.  Leave shield in place until visit.  All paperwork with discharge instructions will be given at the office.  [facility] [place] Address:  [address]  [place], [place] [address]  

## 2022-06-13 NOTE — Interval H&P Note (Signed)
History and Physical Interval Note:  06/13/2022 9:42 AM  Peggy Ramirez  has presented today for surgery, with the diagnosis of nuclear sclerosis age related cataract; left ocular pain and inflammation; left.  The various methods of treatment have been discussed with the patient and family. After consideration of risks, benefits and other options for treatment, the patient has consented to  Procedure(s) with comments: CATARACT EXTRACTION PHACO AND INTRAOCULAR LENS PLACEMENT ([place]) with placement of Corticosteroid (Left) - CDE as a surgical intervention.  The patient's history has been reviewed, patient examined, no change in status, stable for surgery.  I have reviewed the patient's chart and labs.  Questions were answered to the patient's satisfaction.     Baruch Goldmann

## 2022-06-13 NOTE — Anesthesia Preprocedure Evaluation (Signed)
Anesthesia Evaluation  Patient identified by MRN, date of birth, ID band Patient awake    Reviewed: Allergy & Precautions, H&P , NPO status , Patient's Chart, lab work & pertinent test results, reviewed documented beta blocker date and time   Airway Mallampati: II  TM Distance: >3 FB Neck ROM: full    Dental no notable dental hx.    Pulmonary neg pulmonary ROS   Pulmonary exam normal breath sounds clear to auscultation       Cardiovascular Exercise Tolerance: Good hypertension, negative cardio ROS  Rhythm:regular Rate:Normal     Neuro/Psych  Headaches CVA negative neurological ROS  negative psych ROS   GI/Hepatic negative GI ROS, Neg liver ROS,GERD  ,,  Endo/Other  negative endocrine ROS    Renal/GU Renal diseasenegative Renal ROS  negative genitourinary   Musculoskeletal   Abdominal   Peds  Hematology negative hematology ROS (+)   Anesthesia Other Findings   Reproductive/Obstetrics negative OB ROS                             Anesthesia Physical Anesthesia Plan  ASA: 3  Anesthesia Plan: General and MAC   Post-op Pain Management:    Induction:   PONV Risk Score and Plan:   Airway Management Planned:   Additional Equipment:   Intra-op Plan:   Post-operative Plan:   Informed Consent: I have reviewed the patients History and Physical, chart, labs and discussed the procedure including the risks, benefits and alternatives for the proposed anesthesia with the patient or authorized representative who has indicated his/her understanding and acceptance.     Dental Advisory Given  Plan Discussed with: CRNA  Anesthesia Plan Comments:        Anesthesia Quick Evaluation

## 2022-06-14 NOTE — Anesthesia Postprocedure Evaluation (Signed)
Anesthesia Post Note  Patient: Trenee S Shubert  Procedure(s) Performed: CATARACT EXTRACTION PHACO AND INTRAOCULAR LENS PLACEMENT ([facility]) with placement of Corticosteroid (Left: Eye)  Patient location during evaluation: Phase II Anesthesia Type: MAC Level of consciousness: awake Pain management: pain level controlled Vital Signs Assessment: post-procedure vital signs reviewed and stable Respiratory status: spontaneous breathing and respiratory function stable Cardiovascular status: blood pressure returned to baseline and stable Postop Assessment: no headache and no apparent nausea or vomiting Anesthetic complications: no Comments: Late entry   No notable events documented.   Last Vitals:  Vitals:   06/13/22 1015 06/13/22 1017  BP:  117/62  Pulse: 71   Resp: 18   Temp: 36.5 C   SpO2: 96%     Last Pain:  Vitals:   06/13/22 1015  TempSrc: Oral  PainSc: 0-No pain                 Louann Sjogren

## 2022-06-17 DIAGNOSIS — E7849 Other hyperlipidemia: Secondary | ICD-10-CM | POA: Diagnosis not present

## 2022-06-17 DIAGNOSIS — R5383 Other fatigue: Secondary | ICD-10-CM | POA: Diagnosis not present

## 2022-06-17 DIAGNOSIS — R413 Other amnesia: Secondary | ICD-10-CM | POA: Diagnosis not present

## 2022-06-17 DIAGNOSIS — E46 Unspecified protein-calorie malnutrition: Secondary | ICD-10-CM | POA: Diagnosis not present

## 2022-06-17 DIAGNOSIS — Z23 Encounter for immunization: Secondary | ICD-10-CM | POA: Diagnosis not present

## 2022-06-17 DIAGNOSIS — N189 Chronic kidney disease, unspecified: Secondary | ICD-10-CM | POA: Diagnosis not present

## 2022-06-17 DIAGNOSIS — G8194 Hemiplegia, unspecified affecting left nondominant side: Secondary | ICD-10-CM | POA: Diagnosis not present

## 2022-06-17 DIAGNOSIS — M3509 Sicca syndrome with other organ involvement: Secondary | ICD-10-CM | POA: Diagnosis not present

## 2022-06-17 DIAGNOSIS — R252 Cramp and spasm: Secondary | ICD-10-CM | POA: Diagnosis not present

## 2022-06-17 DIAGNOSIS — D72819 Decreased white blood cell count, unspecified: Secondary | ICD-10-CM | POA: Diagnosis not present

## 2022-06-19 DIAGNOSIS — H2511 Age-related nuclear cataract, right eye: Secondary | ICD-10-CM | POA: Diagnosis not present

## 2022-06-24 NOTE — H&P (Signed)
Surgical History & Physical  Patient Name: Peggy Ramirez DOB: 03-08-1934  Surgery: Cataract extraction with intraocular lens implant phacoemulsification; Right Eye  Surgeon: Baruch Goldmann MD Surgery Date:  06-27-22 Pre-Op Date:  06-19-22  HPI: A 75 Yr. old female patient 1. The patient is returning after cataract post-op. The left eye is affected. Status post cataract post-op, which began 1 week ago: Since the last visit, the affected area is doing well. The patient's vision is improved. Patient is following medication instructions and started Flucanazole tb for an infection. The patient complains of difficulty when recognizing people, which began many years ago in right eye. The episode is gradual. The condition's severity increased since last visit. Symptoms occur when the patient is inside, outside and reading. The complaint is associated with glare. This is negatively affecting the patient's quality of life and the patient is unable to function adequately in life with the current level of vision. HPI was performed by Baruch Goldmann .  Medical History: Dry Eyes Cataracts High Blood Pressure Stroke  Review of Systems Negative Allergic/Immunologic Negative Cardiovascular Negative Constitutional Negative Ear, Nose, Mouth & Throat Negative Endocrine Negative Eyes Negative Gastrointestinal Negative Genitourinary Negative Hemotologic/Lymphatic Negative Integumentary Negative Musculoskeletal Negative Neurological Negative Psychiatry Negative Respiratory  Social   Never smoked   Medication Prednisolone-Moxifloxacin-Bromfenac,  Cyclobenzaprine, Dexamethasone, Pantoprazole, Potassium chloride, Colace, Vitamin C, Clopidogrel, Amlodipine, Alprazolam, Ezetimibe, Simvastatin, Trazodone, Multivitamin, Benzonatate, Flucanazole tb,   Sx/Procedures Phaco c IOL OS with Dextenza,   Drug Allergies  Peniciilin,   History & Physical: Heent: cataract, right eye NECK: supple without  bruits LUNGS: lungs clear to auscultation CV: regular rate and rhythm Abdomen: soft and non-tender Impression & Plan: Assessment: 1.  CATARACT EXTRACTION STATUS; Left Eye (Z98.42) 2.  NUCLEAR SCLEROSIS AGE RELATED; , Right Eye (H25.11)  Plan: 1.  1 week after cataract surgery. Doing well with improved vision and normal eye pressure. Call with any problems or concerns. Stop drops - Dextenza. Call with any concerning symptoms.  2.  Cataract accounts for the patient's decreased vision. This visual impairment is not correctable with a tolerable change in glasses or contact lenses. Cataract surgery with an implantation of a new lens should significantly improve the visual and functional status of the patient. Discussed all risks, benefits, alternatives, and potential complications. Discussed the procedures and recovery. Patient desires to have surgery. A-scan ordered and performed today for intra-ocular lens calculations. The surgery will be performed in order to improve vision for driving, reading, and for eye examinations. Recommend phacoemulsification with intra-ocular lens. Recommend Dextenza for post-operative pain and inflammation. Right Eye. Surgery required to correct imbalance of vision. Dilates poorly - shugarcaine by protocol. Malyugin Ring. [place].

## 2022-06-25 ENCOUNTER — Encounter (HOSPITAL_COMMUNITY): Payer: Self-pay

## 2022-06-25 ENCOUNTER — Encounter (HOSPITAL_COMMUNITY)
Admission: RE | Admit: 2022-06-25 | Discharge: 2022-06-25 | Disposition: A | Discharge status: Home / Self Care | Payer: Medicare Other | Source: Ambulatory Visit | Attending: Ophthalmology | Admitting: Ophthalmology

## 2022-06-27 ENCOUNTER — Encounter (HOSPITAL_COMMUNITY)
Admission: RE | Disposition: A | Discharge status: Home / Self Care | Payer: Self-pay | Source: Home / Self Care | Attending: Ophthalmology

## 2022-06-27 ENCOUNTER — Ambulatory Visit (HOSPITAL_COMMUNITY)
Admission: RE | Admit: 2022-06-27 | Discharge: 2022-06-27 | Disposition: A | Discharge status: Home / Self Care | Payer: Medicare Other | Attending: Ophthalmology | Admitting: Ophthalmology

## 2022-06-27 ENCOUNTER — Ambulatory Visit (HOSPITAL_BASED_OUTPATIENT_CLINIC_OR_DEPARTMENT_OTHER): Payer: Medicare Other | Admitting: Anesthesiology

## 2022-06-27 ENCOUNTER — Ambulatory Visit (HOSPITAL_COMMUNITY): Payer: Medicare Other | Admitting: Anesthesiology

## 2022-06-27 DIAGNOSIS — I10 Essential (primary) hypertension: Secondary | ICD-10-CM

## 2022-06-27 DIAGNOSIS — H2181 Floppy iris syndrome: Secondary | ICD-10-CM

## 2022-06-27 DIAGNOSIS — Z79899 Other long term (current) drug therapy: Secondary | ICD-10-CM | POA: Diagnosis not present

## 2022-06-27 DIAGNOSIS — D649 Anemia, unspecified: Secondary | ICD-10-CM

## 2022-06-27 DIAGNOSIS — H5711 Ocular pain, right eye: Secondary | ICD-10-CM | POA: Insufficient documentation

## 2022-06-27 DIAGNOSIS — H2511 Age-related nuclear cataract, right eye: Secondary | ICD-10-CM

## 2022-06-27 DIAGNOSIS — H25811 Combined forms of age-related cataract, right eye: Secondary | ICD-10-CM

## 2022-06-27 DIAGNOSIS — I69398 Other sequelae of cerebral infarction: Secondary | ICD-10-CM | POA: Insufficient documentation

## 2022-06-27 DIAGNOSIS — Z8673 Personal history of transient ischemic attack (TIA), and cerebral infarction without residual deficits: Secondary | ICD-10-CM

## 2022-06-27 DIAGNOSIS — K219 Gastro-esophageal reflux disease without esophagitis: Secondary | ICD-10-CM | POA: Insufficient documentation

## 2022-06-27 SURGERY — CATARACT EXTRACTION PHACO AND INTRAOCULAR LENS PLACEMENT (IOC) with placement of Corticosteroid
Anesthesia: Monitor Anesthesia Care | Site: Eye | Laterality: Right

## 2022-06-27 MED ORDER — CHLORHEXIDINE GLUCONATE 0.12 % MT SOLN: 15.0000 mL | Freq: Once | OROMUCOSAL | Status: DC

## 2022-06-27 MED ORDER — MIDAZOLAM HCL 2 MG/2ML IJ SOLN
INTRAMUSCULAR | Status: AC
  Filled 2022-06-27: qty 2

## 2022-06-27 MED ORDER — EPINEPHRINE PF 1 MG/ML IJ SOLN
INTRAMUSCULAR | Status: AC
  Filled 2022-06-27: qty 1

## 2022-06-27 MED ORDER — DEXAMETHASONE 0.4 MG OP INST
VAGINAL_INSERT | OPHTHALMIC | Status: DC | PRN
  Administered 2022-06-27: .4 mg via OPHTHALMIC

## 2022-06-27 MED ORDER — LIDOCAINE HCL (PF) 1 % IJ SOLN
INTRAOCULAR | Status: DC | PRN
  Administered 2022-06-27: 1 mL via OPHTHALMIC

## 2022-06-27 MED ORDER — LIDOCAINE HCL 3.5 % OP GEL
1.0000 | Freq: Once | OPHTHALMIC | Status: AC
  Administered 2022-06-27: 1 via OPHTHALMIC

## 2022-06-27 MED ORDER — STERILE WATER FOR IRRIGATION IR SOLN
Status: DC | PRN
  Administered 2022-06-27: 250 mL

## 2022-06-27 MED ORDER — TROPICAMIDE 1 % OP SOLN
1.0000 [drp] | OPHTHALMIC | Status: AC | PRN
  Administered 2022-06-27 (×3): 1 [drp] via OPHTHALMIC

## 2022-06-27 MED ORDER — DEXAMETHASONE 0.4 MG OP INST
VAGINAL_INSERT | OPHTHALMIC | Status: AC
  Filled 2022-06-27: qty 1

## 2022-06-27 MED ORDER — PHENYLEPHRINE-KETOROLAC 1-0.3 % IO SOLN
INTRAOCULAR | Status: AC
  Filled 2022-06-27: qty 4

## 2022-06-27 MED ORDER — MIDAZOLAM HCL 5 MG/5ML IJ SOLN
INTRAMUSCULAR | Status: DC | PRN
  Administered 2022-06-27: .5 mg via INTRAVENOUS

## 2022-06-27 MED ORDER — ORAL CARE MOUTH RINSE: 15.0000 mL | Freq: Once | OROMUCOSAL | Status: DC

## 2022-06-27 MED ORDER — TETRACAINE HCL 0.5 % OP SOLN
1.0000 [drp] | OPHTHALMIC | Status: AC | PRN
  Administered 2022-06-27 (×3): 1 [drp] via OPHTHALMIC

## 2022-06-27 MED ORDER — BSS IO SOLN
INTRAOCULAR | Status: DC | PRN
  Administered 2022-06-27: 15 mL via INTRAOCULAR

## 2022-06-27 MED ORDER — PHENYLEPHRINE HCL 2.5 % OP SOLN
1.0000 [drp] | OPHTHALMIC | Status: AC | PRN
  Administered 2022-06-27 (×3): 1 [drp] via OPHTHALMIC

## 2022-06-27 MED ORDER — SODIUM HYALURONATE 10 MG/ML IO SOLUTION
PREFILLED_SYRINGE | INTRAOCULAR | Status: DC | PRN
  Administered 2022-06-27: .85 mL via INTRAOCULAR

## 2022-06-27 MED ORDER — POVIDONE-IODINE 5 % OP SOLN
OPHTHALMIC | Status: DC | PRN
  Administered 2022-06-27: 1 via OPHTHALMIC

## 2022-06-27 MED ORDER — PHENYLEPHRINE-KETOROLAC 1-0.3 % IO SOLN
INTRAOCULAR | Status: DC | PRN
  Administered 2022-06-27: 500 mL via OPHTHALMIC

## 2022-06-27 MED ORDER — SODIUM HYALURONATE 23MG/ML IO SOSY
PREFILLED_SYRINGE | INTRAOCULAR | Status: DC | PRN
  Administered 2022-06-27: .6 mL via INTRAOCULAR

## 2022-06-27 SURGICAL SUPPLY — 15 items
CATARACT SUITE SIGHTPATH (MISCELLANEOUS) ×1 IMPLANT
CLOTH BEACON ORANGE TIMEOUT ST (SAFETY) ×1 IMPLANT
EYE SHIELD UNIVERSAL CLEAR (GAUZE/BANDAGES/DRESSINGS) IMPLANT
FEE CATARACT SUITE SIGHTPATH (MISCELLANEOUS) ×1 IMPLANT
GLOVE BIOGEL PI IND STRL 7.0 (GLOVE) ×2 IMPLANT
LENS IOL RAYNER 24.0 (Intraocular Lens) ×1 IMPLANT
LENS IOL RAYONE EMV 24.0 (Intraocular Lens) IMPLANT
NDL HYPO 18GX1.5 BLUNT FILL (NEEDLE) ×1 IMPLANT
NEEDLE HYPO 18GX1.5 BLUNT FILL (NEEDLE) ×1 IMPLANT
PAD ARMBOARD 7.5X6 YLW CONV (MISCELLANEOUS) ×1 IMPLANT
RING MALYGIN 7.0 (MISCELLANEOUS) IMPLANT
SYR TB 1ML LL NO SAFETY (SYRINGE) ×1 IMPLANT
TAPE SURG TRANSPORE 1 IN (GAUZE/BANDAGES/DRESSINGS) IMPLANT
TAPE SURGICAL TRANSPORE 1 IN (GAUZE/BANDAGES/DRESSINGS) ×1
WATER STERILE IRR 250ML POUR (IV SOLUTION) ×1 IMPLANT

## 2022-06-27 NOTE — Anesthesia Postprocedure Evaluation (Signed)
Anesthesia Post Note  Patient: Peggy Ramirez  Procedure(s) Performed: CATARACT EXTRACTION PHACO AND INTRAOCULAR LENS PLACEMENT ([facility]) with placement of Corticosteroid (Right: Eye)  Patient location during evaluation: Phase II Anesthesia Type: MAC Level of consciousness: awake and alert and oriented Pain management: pain level controlled Vital Signs Assessment: post-procedure vital signs reviewed and stable Respiratory status: spontaneous breathing, nonlabored ventilation and respiratory function stable Cardiovascular status: blood pressure returned to baseline and stable Postop Assessment: no apparent nausea or vomiting Anesthetic complications: no  No notable events documented.   Last Vitals:  Vitals:   06/27/22 0646 06/27/22 0817  BP:  119/66  Pulse: 69 66  Resp: (!) 21 18  Temp:  36.6 C  SpO2: 100% 98%    Last Pain:  Vitals:   06/27/22 0817  TempSrc: Oral  PainSc: 0-No pain                 Samamtha Tiegs C Brailyn Killion

## 2022-06-27 NOTE — Interval H&P Note (Signed)
History and Physical Interval Note:  06/27/2022 7:46 AM  Peggy Ramirez  has presented today for surgery, with the diagnosis of nuclear sclerosis age related cataract; right.  The various methods of treatment have been discussed with the patient and family. After consideration of risks, benefits and other options for treatment, the patient has consented to  Procedure(s): CATARACT EXTRACTION PHACO AND INTRAOCULAR LENS PLACEMENT ([place]) with placement of Corticosteroid (Right) as a surgical intervention.  The patient's history has been reviewed, patient examined, no change in status, stable for surgery.  I have reviewed the patient's chart and labs.  Questions were answered to the patient's satisfaction.     Baruch Goldmann

## 2022-06-27 NOTE — Discharge Instructions (Addendum)
Please discharge patient when stable, will follow up today with Dr. Wrzosek at the [facility] [place] office immediately following discharge.  Leave shield in place until visit.  All paperwork with discharge instructions will be given at the office.  [facility] Address:  [address]  [place], [place] [address]  

## 2022-06-27 NOTE — Transfer of Care (Signed)
Immediate Anesthesia Transfer of Care Note  Patient: Peggy Ramirez  Procedure(s) Performed: CATARACT EXTRACTION PHACO AND INTRAOCULAR LENS PLACEMENT ([facility]) with placement of Corticosteroid (Right: Eye)  Patient Location: PACU  Anesthesia Type:MAC  Level of Consciousness: awake, alert , and oriented  Airway & Oxygen Therapy: Patient Spontanous Breathing  Post-op Assessment: Report given to RN and Post -op Vital signs reviewed and stable  Post vital signs: Reviewed and stable  Last Vitals:  Vitals Value Taken Time  BP    Temp    Pulse    Resp    SpO2      Last Pain:  Vitals:   06/27/22 0642  PainSc: 0-No pain         Complications: No notable events documented.

## 2022-06-27 NOTE — Anesthesia Preprocedure Evaluation (Signed)
Anesthesia Evaluation  Patient identified by MRN, date of birth, ID band Patient awake    Reviewed: Allergy & Precautions, H&P , NPO status , Patient's Chart, lab work & pertinent test results  Airway Mallampati: II  TM Distance: >3 FB Neck ROM: Full    Dental  (+) Dental Advisory Given, Partial Upper, Lower Dentures   Pulmonary neg pulmonary ROS   Pulmonary exam normal breath sounds clear to auscultation       Cardiovascular Exercise Tolerance: Poor hypertension, Pt. on medications Normal cardiovascular exam Rhythm:Regular Rate:Normal     Neuro/Psych  Headaches CVA (loss of balance), Residual Symptoms  negative psych ROS   GI/Hepatic Neg liver ROS,GERD  Medicated,,  Endo/Other  negative endocrine ROS    Renal/GU Renal disease  negative genitourinary   Musculoskeletal negative musculoskeletal ROS (+)    Abdominal   Peds negative pediatric ROS (+)  Hematology  (+) Blood dyscrasia, anemia   Anesthesia Other Findings   Reproductive/Obstetrics negative OB ROS                             Anesthesia Physical Anesthesia Plan  ASA: 3  Anesthesia Plan: MAC   Post-op Pain Management: Minimal or no pain anticipated   Induction:   PONV Risk Score and Plan: Treatment may vary due to age or medical condition  Airway Management Planned: Nasal Cannula and Natural Airway  Additional Equipment:   Intra-op Plan:   Post-operative Plan:   Informed Consent: I have reviewed the patients History and Physical, chart, labs and discussed the procedure including the risks, benefits and alternatives for the proposed anesthesia with the patient or authorized representative who has indicated his/her understanding and acceptance.     Dental advisory given  Plan Discussed with: CRNA and Surgeon  Anesthesia Plan Comments:        Anesthesia Quick Evaluation

## 2022-06-27 NOTE — Op Note (Signed)
Date of procedure: 06/27/22  Pre-operative diagnosis:  Visually significant nuclear age-related cataract, Right Eye (H25.11); Poor dilation, right iris  Post-operative diagnosis:   1. Visually significant combined form age-related cataract, Right Eye (H25.811) 2. Pain and inflammation following cataract surgery Right Eye (H57.11) 3. Intra-operative floppy iris syndrome, right eye  Procedure:  Complex Removal of cataract via phacoemulsification and insertion of intra-ocular lens Rayner RAO200E +24.0D into the capsular bag of the Right Eye [id number]) 2. Placement of Dextenza insert, Right Eye  Attending surgeon: Gerda Diss. Shanikia Kernodle, MD, MA  Anesthesia: MAC, Topical Akten  Complications: None  Estimated Blood Loss: <92m (minimal)  Specimens: None  Implants: As above  Indications:  Visually significant age-related cataract, Right Eye  Procedure:  The patient was seen and identified in the pre-operative area. The operative eye was identified and dilated.  The operative eye was marked.  Topical anesthesia was administered to the operative eye.     The patient was then to the operative suite and placed in the supine position.  A timeout was performed confirming the patient, procedure to be performed, and all other relevant information.   The patient's face was prepped and draped in the usual fashion for intra-ocular surgery.  A lid speculum was placed into the operative eye and the surgical microscope moved into place and focused.  A superotemporal paracentesis was created using a 20 gauge paracentesis blade.  Shugarcaine was injected into the anterior chamber.  Viscoelastic was injected into the anterior chamber.  A temporal clear-corneal main wound incision was created using a 2.474mmicrokeratome. A 7.48m7malyugin ring was placed. A continuous curvilinear capsulorrhexis was initiated using an irrigating cystitome and completed using capsulorrhexis forceps.  Hydrodissection and hydrodeliniation  were performed.  Viscoelastic was injected into the anterior chamber.  A phacoemulsification handpiece and a chopper as a second instrument were used to remove the nucleus and epinucleus. The irrigation/aspiration handpiece was used to remove any remaining cortical material.   The capsular bag was reinflated with viscoelastic, checked, and found to be intact.  The intraocular lens was inserted into the capsular bag. The Malyugin ring was removed. The irrigation/aspiration handpiece was used to remove any remaining viscoelastic.  The clear corneal wound and paracentesis wounds were then hydrated and checked with Weck-Cels to be watertight.   The lid-speculum was removed. The lower punctum was dilated. A Dextenza implant was placed in the lower canaliculus without complication.  The drape was removed.  The patient's face was cleaned with a wet and dry 4x4. A clear shield was taped over the eye. The patient was taken to the post-operative care unit in good condition, having tolerated the procedure well.  Post-Op Instructions: The patient will follow [facility] at [facility] a same day post-operative evaluation and will receive all other orders and instructions.

## 2022-07-14 DIAGNOSIS — R0789 Other chest pain: Secondary | ICD-10-CM | POA: Diagnosis not present

## 2022-07-14 DIAGNOSIS — Z681 Body mass index (BMI) 19 or less, adult: Secondary | ICD-10-CM | POA: Diagnosis not present

## 2022-08-16 ENCOUNTER — Other Ambulatory Visit: Payer: Self-pay | Admitting: Cardiology

## 2022-08-26 DIAGNOSIS — K2100 Gastro-esophageal reflux disease with esophagitis, without bleeding: Secondary | ICD-10-CM | POA: Diagnosis not present

## 2022-08-26 DIAGNOSIS — I10 Essential (primary) hypertension: Secondary | ICD-10-CM | POA: Diagnosis not present

## 2022-09-25 DIAGNOSIS — Z1231 Encounter for screening mammogram for malignant neoplasm of breast: Secondary | ICD-10-CM | POA: Diagnosis not present

## 2022-10-03 DIAGNOSIS — E7849 Other hyperlipidemia: Secondary | ICD-10-CM | POA: Diagnosis not present

## 2022-10-03 DIAGNOSIS — K2100 Gastro-esophageal reflux disease with esophagitis, without bleeding: Secondary | ICD-10-CM | POA: Diagnosis not present

## 2022-10-03 DIAGNOSIS — E559 Vitamin D deficiency, unspecified: Secondary | ICD-10-CM | POA: Diagnosis not present

## 2022-10-14 IMAGING — MR MR MRA HEAD W/O CM
1 series · 48 of 48 positions shown · non-contrast
Comparison: MRI/MRI October 24, 2015. CT head from yesterday.

CLINICAL DATA: Mental status change.  Unknown cause.

EXAM:
MRI HEAD WITHOUT CONTRAST
MRA HEAD WITHOUT CONTRAST
TECHNIQUE: Multiplanar, multi-echo pulse sequences of the brain and surrounding
structures were acquired without intravenous contrast. Angiographic
images of the Circle of Willis were acquired using MRA technique
without intravenous contrast.

[Series 100: TOF · axial · 0.5mm · 0.76mm/px · z∈[-84,-8]mm · 48 of 168 slices shown]
[im 1/168]
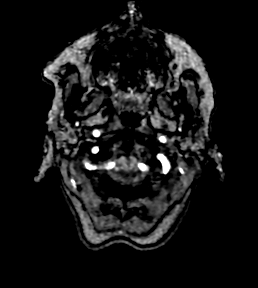
[im 4/168]
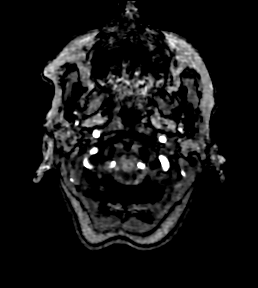
[im 8/168]
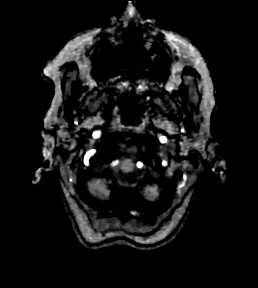
[im 11/168]
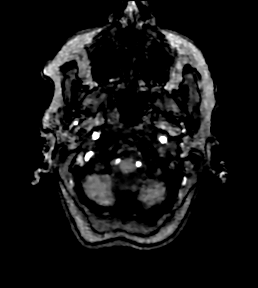
[im 15/168]
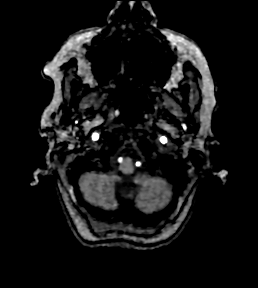
[im 18/168]
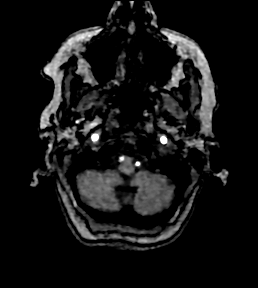
[im 22/168]
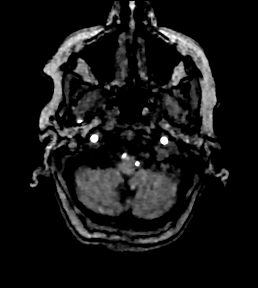
[im 25/168]
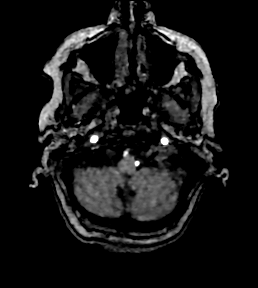
[im 29/168]
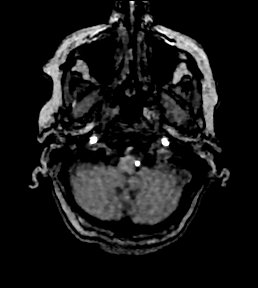
[im 32/168]
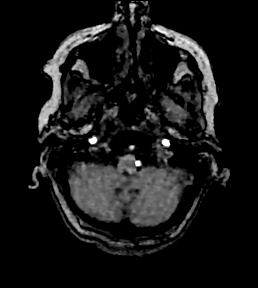
[im 36/168]
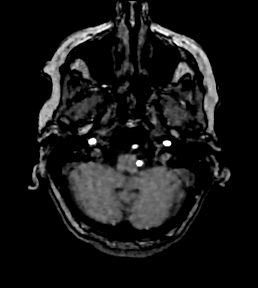
[im 40/168]
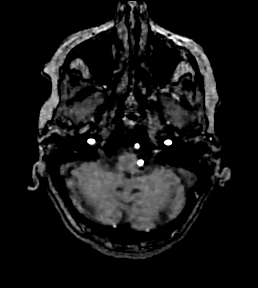
[im 43/168]
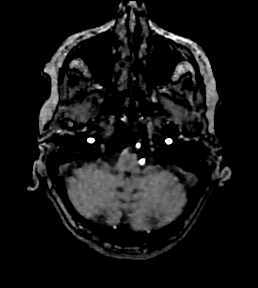
[im 47/168]
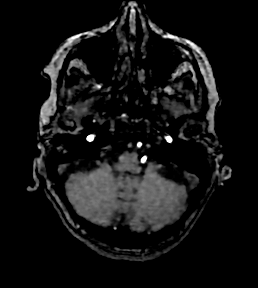
[im 50/168]
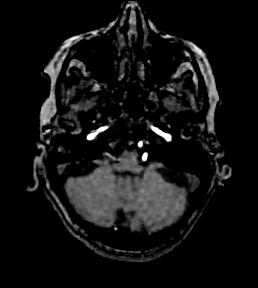
[im 54/168]
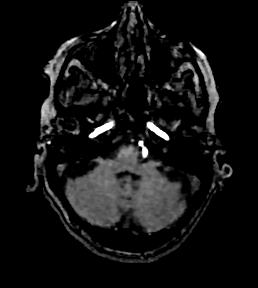
[im 57/168]
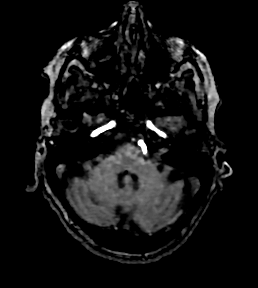
[im 61/168]
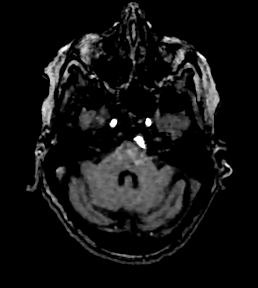
[im 64/168]
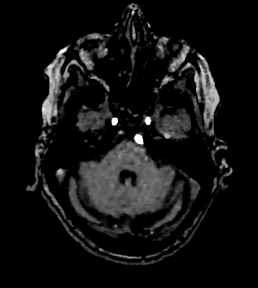
[im 68/168]
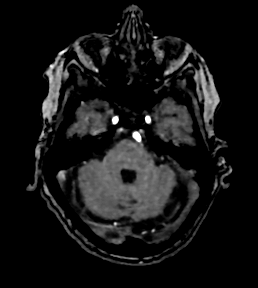
[im 72/168]
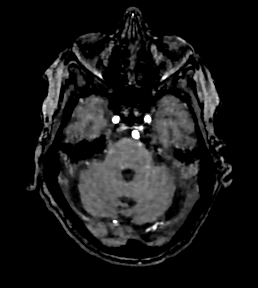
[im 75/168]
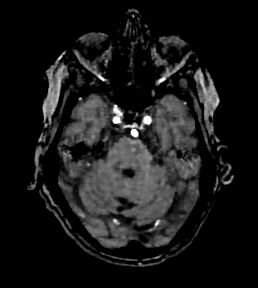
[im 79/168]
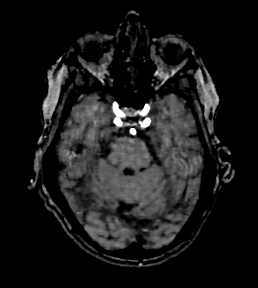
[im 82/168]
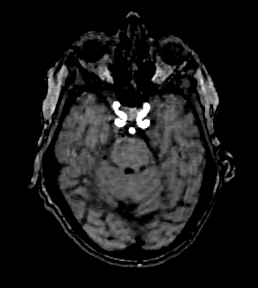
[im 86/168]
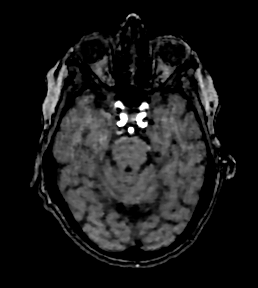
[im 89/168]
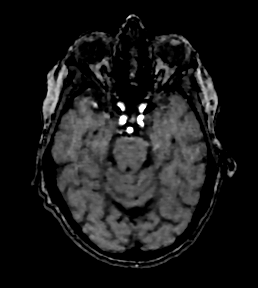
[im 93/168]
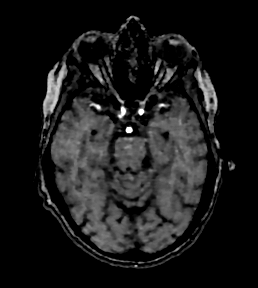
[im 96/168]
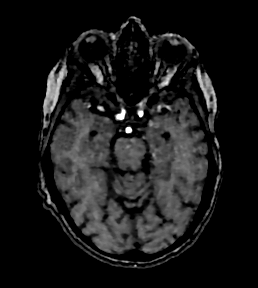
[im 100/168]
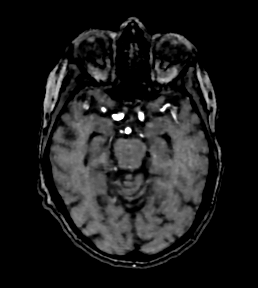
[im 104/168]
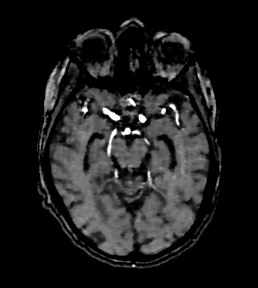
[im 107/168]
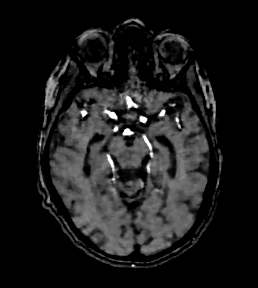
[im 111/168]
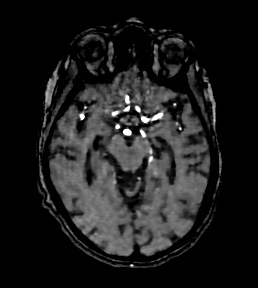
[im 114/168]
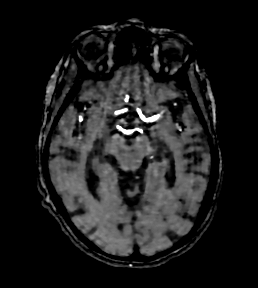
[im 118/168]
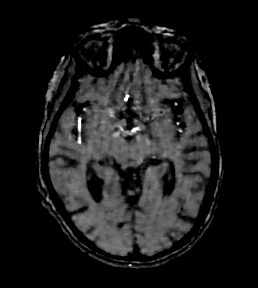
[im 121/168]
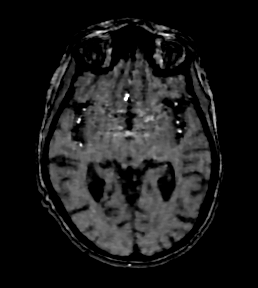
[im 125/168]
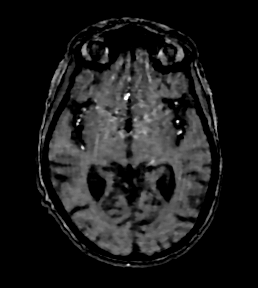
[im 128/168]
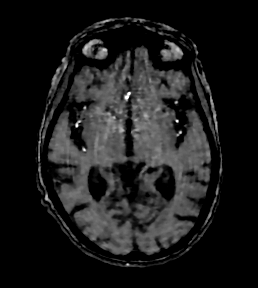
[im 132/168]
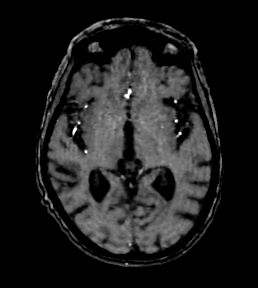
[im 136/168]
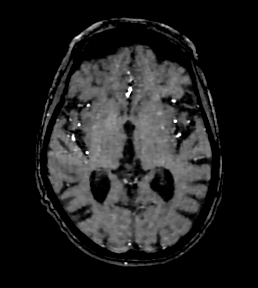
[im 139/168]
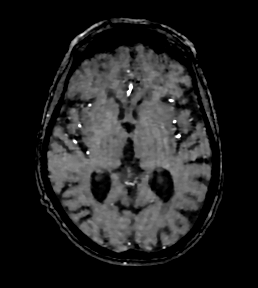
[im 143/168]
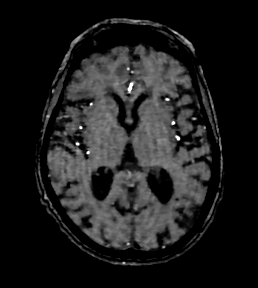
[im 146/168]
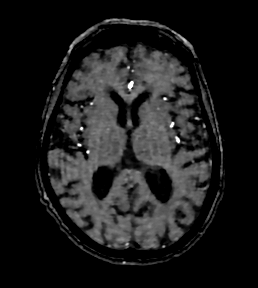
[im 150/168]
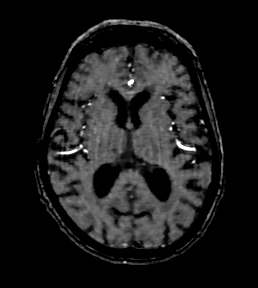
[im 153/168]
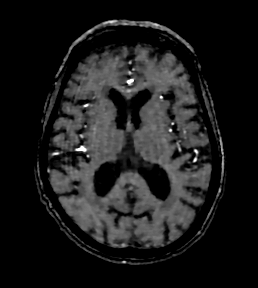
[im 157/168]
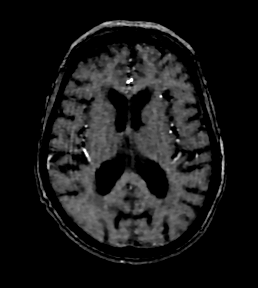
[im 160/168]
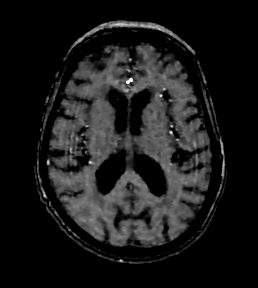
[im 164/168]
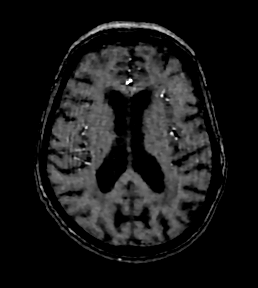
[im 168/168]
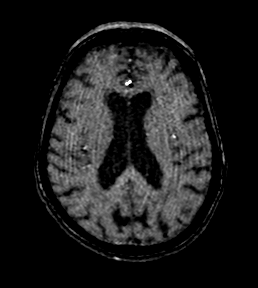

[48 of 48 positions shown; findings below may reference images not displayed]

FINDINGS: MRI HEAD FINDINGS

Brain: Acute infarct in the right frontal white matter (series 5,
image 23). Additional small mild area of restricted diffusion in the
left frontal cortex (series 5, image 21). No significant edema or
mass effect. No acute hemorrhage. Additional faint focus of DWI
hyperintensity in the more anterior left frontal cortex (series 5,
image 24) is associated with an area of susceptibility artifact and
favored to represent artifact from mineralization when correlating
with prior CT head. No evidence of acute hemorrhage, hydrocephalus,
mass lesion, midline shift, extra-axial fluid collection. On
susceptibility weighted imaging there is subtle sulcal
susceptibility artifact along the bifrontal convexities, suggestive
of superficial siderosis. Moderate scattered T2/FLAIR
hyperintensities within the white matter, nonspecific but most
likely related to chronic microvascular ischemic disease. Mild for
age atrophy.

Vascular: See below.

Skull and upper cervical spine: Normal marrow signal.

Sinuses/Orbits: Mild ethmoid air cell mucosal thickening.
Unremarkable orbits.

Other: Small bilateral mastoid effusions.

MRA HEAD FINDINGS

Anterior circulation: Bilateral ICAs, MCAs, in the ACAs are patent.
Similar mild moderate stenosis of the left MCA origin. Suspected at
least moderate stenosis of the right M1 MCA, which may be
accentuated by motion and downward flow on this noncontrast MRA.

Posterior circulation: Bilateral vertebral arteries, basilar artery,
and posterior cerebral arteries are patent. Suspected at least
moderate right vertebral artery stenosis. Bilateral PCAs are patent
with likely mild right PCA stenosis.
IMPRESSION: MRI:

1. Acute infarct in the right frontal white matter. Possible
additional small acute infarct in the left frontal cortex. No
significant edema or mass effect. Given potential involvement of
multiple vascular territories, consider a central embolic etiology.
2. Suggestion of bifrontal superficial siderosis on susceptibility
weighting imaging, possibly the sequela of prior subarachnoid
hemorrhage given no acute hemorrhage on recent CT head.
3. Moderate chronic microvascular disease and mild atrophy.

MRA:

1. Motion limited evaluation with suspected at least moderate
stenosis of the right M1 MCA and right intradural vertebral artery.
2. Mild-to-moderate left M1 MCA stenosis.

## 2022-10-28 DIAGNOSIS — D72819 Decreased white blood cell count, unspecified: Secondary | ICD-10-CM | POA: Diagnosis not present

## 2022-10-28 DIAGNOSIS — N189 Chronic kidney disease, unspecified: Secondary | ICD-10-CM | POA: Diagnosis not present

## 2022-10-28 DIAGNOSIS — M3509 Sicca syndrome with other organ involvement: Secondary | ICD-10-CM | POA: Diagnosis not present

## 2022-10-28 DIAGNOSIS — E46 Unspecified protein-calorie malnutrition: Secondary | ICD-10-CM | POA: Diagnosis not present

## 2022-10-28 DIAGNOSIS — R030 Elevated blood-pressure reading, without diagnosis of hypertension: Secondary | ICD-10-CM | POA: Diagnosis not present

## 2022-10-28 DIAGNOSIS — M1710 Unilateral primary osteoarthritis, unspecified knee: Secondary | ICD-10-CM | POA: Diagnosis not present

## 2022-10-28 DIAGNOSIS — R5383 Other fatigue: Secondary | ICD-10-CM | POA: Diagnosis not present

## 2022-10-28 DIAGNOSIS — K2100 Gastro-esophageal reflux disease with esophagitis, without bleeding: Secondary | ICD-10-CM | POA: Diagnosis not present

## 2022-10-28 DIAGNOSIS — Z0001 Encounter for general adult medical examination with abnormal findings: Secondary | ICD-10-CM | POA: Diagnosis not present

## 2022-10-28 DIAGNOSIS — G8194 Hemiplegia, unspecified affecting left nondominant side: Secondary | ICD-10-CM | POA: Diagnosis not present

## 2023-02-16 DIAGNOSIS — E559 Vitamin D deficiency, unspecified: Secondary | ICD-10-CM | POA: Diagnosis not present

## 2023-02-16 DIAGNOSIS — E7849 Other hyperlipidemia: Secondary | ICD-10-CM | POA: Diagnosis not present

## 2023-02-16 DIAGNOSIS — N183 Chronic kidney disease, stage 3 unspecified: Secondary | ICD-10-CM | POA: Diagnosis not present

## 2023-02-24 ENCOUNTER — Other Ambulatory Visit: Payer: Self-pay | Admitting: Cardiology

## 2023-02-26 DIAGNOSIS — I63521 Cerebral infarction due to unspecified occlusion or stenosis of right anterior cerebral artery: Secondary | ICD-10-CM | POA: Diagnosis not present

## 2023-02-26 DIAGNOSIS — E782 Mixed hyperlipidemia: Secondary | ICD-10-CM | POA: Diagnosis not present

## 2023-02-26 DIAGNOSIS — M549 Dorsalgia, unspecified: Secondary | ICD-10-CM | POA: Diagnosis not present

## 2023-02-26 DIAGNOSIS — K2100 Gastro-esophageal reflux disease with esophagitis, without bleeding: Secondary | ICD-10-CM | POA: Diagnosis not present

## 2023-03-05 DIAGNOSIS — D72819 Decreased white blood cell count, unspecified: Secondary | ICD-10-CM | POA: Diagnosis not present

## 2023-03-05 DIAGNOSIS — G8194 Hemiplegia, unspecified affecting left nondominant side: Secondary | ICD-10-CM | POA: Diagnosis not present

## 2023-03-05 DIAGNOSIS — E46 Unspecified protein-calorie malnutrition: Secondary | ICD-10-CM | POA: Diagnosis not present

## 2023-03-05 DIAGNOSIS — E7849 Other hyperlipidemia: Secondary | ICD-10-CM | POA: Diagnosis not present

## 2023-03-05 DIAGNOSIS — Z23 Encounter for immunization: Secondary | ICD-10-CM | POA: Diagnosis not present

## 2023-03-05 DIAGNOSIS — R634 Abnormal weight loss: Secondary | ICD-10-CM | POA: Diagnosis not present

## 2023-03-05 DIAGNOSIS — R5383 Other fatigue: Secondary | ICD-10-CM | POA: Diagnosis not present

## 2023-03-05 DIAGNOSIS — M1710 Unilateral primary osteoarthritis, unspecified knee: Secondary | ICD-10-CM | POA: Diagnosis not present

## 2023-03-05 DIAGNOSIS — M3509 Sicca syndrome with other organ involvement: Secondary | ICD-10-CM | POA: Diagnosis not present

## 2023-03-05 DIAGNOSIS — R252 Cramp and spasm: Secondary | ICD-10-CM | POA: Diagnosis not present

## 2023-05-04 ENCOUNTER — Other Ambulatory Visit: Payer: Self-pay | Admitting: Cardiology

## 2023-05-12 DIAGNOSIS — S0502XA Injury of conjunctiva and corneal abrasion without foreign body, left eye, initial encounter: Secondary | ICD-10-CM | POA: Diagnosis not present

## 2023-05-18 DIAGNOSIS — H11823 Conjunctivochalasis, bilateral: Secondary | ICD-10-CM | POA: Diagnosis not present

## 2023-05-18 DIAGNOSIS — H04123 Dry eye syndrome of bilateral lacrimal glands: Secondary | ICD-10-CM | POA: Diagnosis not present

## 2023-05-18 DIAGNOSIS — H02831 Dermatochalasis of right upper eyelid: Secondary | ICD-10-CM | POA: Diagnosis not present

## 2023-05-18 DIAGNOSIS — H02834 Dermatochalasis of left upper eyelid: Secondary | ICD-10-CM | POA: Diagnosis not present

## 2023-05-21 ENCOUNTER — Encounter: Payer: Self-pay | Admitting: Cardiology

## 2023-05-21 ENCOUNTER — Ambulatory Visit: Payer: Medicare Other | Attending: Cardiology | Admitting: Cardiology

## 2023-05-21 VITALS — BP 118/62 | HR 74 | Ht 59.0 in

## 2023-05-21 DIAGNOSIS — I10 Essential (primary) hypertension: Secondary | ICD-10-CM | POA: Diagnosis not present

## 2023-05-21 DIAGNOSIS — R0789 Other chest pain: Secondary | ICD-10-CM | POA: Diagnosis not present

## 2023-05-21 DIAGNOSIS — E782 Mixed hyperlipidemia: Secondary | ICD-10-CM

## 2023-05-21 MED ORDER — AMLODIPINE BESYLATE 2.5 MG PO TABS: 2.5000 mg | ORAL_TABLET | Freq: Every day | ORAL | 1 refills | Status: DC

## 2023-05-21 NOTE — Progress Notes (Signed)
Clinical Summary Ms. Leseberg is a 89 y.o.female seen today for follow up of the following medical problems.     1. Chest pain - from previous clinic notes has history of chest pain and SOB, normal DSE in 11/2011 - 10/2014 Lexiscan showed no ischemia       10/2020 echo: LVEF 60-65%, no WMAs - from last PA note 12/2020 chest pain had improved on protonix.  -still with some chronic pains at times  - some chest tightness at times only after swallowing her pills, no exertional symptoms.    2. HTN - reports pcp had stop HCTZ. -on norvasc 5mg  daily, compliant - has noted some recent LE edema     3. CVA -  admit 09/2015 with left hand weakness. MRI showed small cortical infarct in the posterior right MCA territory.  - 30 day event monitor without significant arrhythmias     4. HLD - atorva caused muscle aches, symptoms improved after stopping about 1 month ago.  - currently on simvastatin and zetia 10mg   - labs followed by pcp   05/2021 TC 133 TG 99 HDL 60 LDL 55  Pcp stopped zetia - labs followed by pcp   5.Palpitations 10/2020 monitor: Rare ectopy, rare SVT up to 20 beats.  - denies recent Symptoms Past Medical History:  Diagnosis Date   Acute metabolic encephalopathy    Chronic constipation    Chronic left lower quadrant pain    GERD (gastroesophageal reflux disease)    Headache(784.0)    High cholesterol    Hydrosalpinx    LEFT   Hypertension    Lupus (HCC)    Membranous glomerulonephritis    followed by Dr. Clelia Schaumann   Ovarian fibroma    RIGHT   Pelvic adhesions    Sjogren's syndrome (HCC)    Stroke (HCC)      Allergies  Allergen Reactions   Atorvastatin Other (See Comments)    Muscle aches   Cellcept [Mycophenolate Mofetil] Other (See Comments)    unknown   Medrol [Methylprednisolone] Other (See Comments)    unknown   Mycophenolate Mofetil Other (See Comments)    unknown   Penicillins Itching   Sulfonamide Derivatives Nausea Only    Topiramate Other (See Comments)    unknown   Verapamil Other (See Comments)    Felt shakey   Levofloxacin Rash     Current Outpatient Medications  Medication Sig Dispense Refill   ALPRAZolam (XANAX) 0.25 MG tablet Take 0.25 mg by mouth at bedtime as needed for anxiety.     amLODipine (NORVASC) 5 MG tablet TAKE 1 TABLET BY MOUTH DAILY 100 tablet 2   Ascorbic Acid (VITAMIN C) 1000 MG tablet Take 1,000 mg by mouth daily.     clopidogrel (PLAVIX) 75 MG tablet Take 1 tablet (75 mg total) by mouth daily. 30 tablet 1   docusate sodium (COLACE) 100 MG capsule Take 100 mg by mouth daily.     ezetimibe (ZETIA) 10 MG tablet Take 1 tablet (10 mg total) by mouth daily at 6 PM. 30 tablet 0   loratadine (CLARITIN) 10 MG tablet Take 10 mg by mouth daily as needed for allergies.     Misc. Devices (3-IN-1 BEDSIDE TOILET) MISC Patient needs 3 in 1 bedside toilet. Recent hip fracture with repair 1 each 1   Multiple Vitamin (MULTIVITAMIN) tablet Take 1 tablet by mouth daily.     pantoprazole (PROTONIX) 40 MG tablet Take 40 mg by mouth daily.  polyethylene glycol powder (GLYCOLAX/MIRALAX) 17 GM/SCOOP powder Take 17 g by mouth daily as needed for mild constipation. 255 g 0   simvastatin (ZOCOR) 20 MG tablet Take 20 mg by mouth daily.     traZODone (DESYREL) 50 MG tablet Take 75 mg by mouth at bedtime.     No current facility-administered medications for this visit.     Past Surgical History:  Procedure Laterality Date   APPENDECTOMY  04/28/2000   BIOPSY  12/17/2020   Procedure: BIOPSY;  Surgeon: Lanelle Bal, DO;  Location: [facility];  Service: Endoscopy;;   COLONOSCOPY WITH PROPOFOL N/A 12/17/2020   Procedure: COLONOSCOPY WITH PROPOFOL;  Surgeon: Lanelle Bal, DO;  Location: [facility];  Service: Endoscopy;  Laterality: N/A;   FRACTURE SURGERY     HIP PINNING,CANNULATED Left 12/15/2019   Procedure: CANNULATED HIP PINNING;  Surgeon: Roby Lofts, MD;  Location: [facility];  Service:  Orthopedics;  Laterality: Left;   LAPAROSCOPIC CHOLECYSTECTOMY  04/28/2000   TONSILLECTOMY  04/29/1939   TOTAL ABDOMINAL HYSTERECTOMY W/ BILATERAL SALPINGOOPHORECTOMY  04/29/2003   VAGINAL HYSTERECTOMY  04/29/1979     Allergies  Allergen Reactions   Atorvastatin Other (See Comments)    Muscle aches   Cellcept [Mycophenolate Mofetil] Other (See Comments)    unknown   Medrol [Methylprednisolone] Other (See Comments)    unknown   Mycophenolate Mofetil Other (See Comments)    unknown   Penicillins Itching   Sulfonamide Derivatives Nausea Only   Topiramate Other (See Comments)    unknown   Verapamil Other (See Comments)    Felt shakey   Levofloxacin Rash      Family History  Problem Relation Age of Onset   Heart disease Other    Anxiety disorder Other    Depression Other      Social History Ms. Dirr reports that she has never smoked. She has never used smokeless tobacco. Ms. Pagliuca reports no history of alcohol use.   Review of Systems CONSTITUTIONAL: No weight loss, fever, chills, weakness or fatigue.  HEENT: Eyes: No visual loss, blurred vision, double vision or yellow sclerae.No hearing loss, sneezing, congestion, runny nose or sore throat.  SKIN: No rash or itching.  CARDIOVASCULAR: per hpi RESPIRATORY: No shortness of breath, cough or sputum.  GASTROINTESTINAL: No anorexia, nausea, vomiting or diarrhea. No abdominal pain or blood.  GENITOURINARY: No burning on urination, no polyuria NEUROLOGICAL: No headache, dizziness, syncope, paralysis, ataxia, numbness or tingling in the extremities. No change in bowel or bladder control.  MUSCULOSKELETAL: No muscle, back pain, joint pain or stiffness.  LYMPHATICS: No enlarged nodes. No history of splenectomy.  PSYCHIATRIC: No history of depression or anxiety.  ENDOCRINOLOGIC: No reports of sweating, cold or heat intolerance. No polyuria or polydipsia.  [name]   Physical Examination Today's Vitals   05/21/23 1442  BP:  118/62  Pulse: 74  SpO2: 96%  Height: 4\' 11"  (1.499 m)   Body mass index is 21.41 kg/m.  Gen: resting comfortably, no acute distress HEENT: no scleral icterus, pupils equal round and reactive, no palptable cervical adenopathy,  CV: RRR, no mrg, no jvd Resp: Clear to auscultation bilaterally GI: abdomen is soft, non-tender, non-distended, normal bowel sounds, no hepatosplenomegaly MSK: extremities are warm, no edema.  Skin: warm, no rash Neuro:  no focal deficits Psych: appropriate affect   Diagnostic Studies  11/2011 DSE Baseline LVEF 60-65%, negative for ischemia   10/2014 Lexiscan MPI The study is normal. This is a low risk study.  Nuclear stress EF: 60%. There was no ST segment deviation noted during stress.   09/2015 echo Study Conclusions   - Left ventricle: The cavity size was normal. There was focal basal   hypertrophy. Systolic function was normal. The estimated ejection   fraction was in the range of 55% to 60%. Wall motion was normal;   there were no regional wall motion abnormalities. Doppler   parameters are consistent with abnormal left ventricular   relaxation (grade 1 diastolic dysfunction). - Aortic valve: Mildly calcified annulus. Trileaflet; normal   thickness leaflets. Valve area (VTI): 2.18 cm^2. Valve area   (Vmax): 2.24 cm^2. - Mitral valve: There was mild regurgitation. - Atrial septum: No defect or patent foramen ovale was identified. - Technically adequate study.   09/2015 Carotid US IMPRESSION: Mild amount of plaque at both carotid bifurcations, as above. Estimated bilateral ICA stenoses are less than 50%.   10/2020 echo IMPRESSIONS     1. Left ventricular ejection fraction, by estimation, is 60 to 65%. The  left ventricle has normal function. The left ventricle has no regional  wall motion abnormalities. There is mild left ventricular hypertrophy.  Left ventricular diastolic parameters  are indeterminate.   2. Right ventricular systolic  function is normal. The right ventricular  size is normal. There is normal pulmonary artery systolic pressure. The  estimated right ventricular systolic pressure is 21.1 mmHg.   3. There is a trivial pericardial effusion that is circumferential.   4. The mitral valve is grossly normal. No evidence of mitral valve  regurgitation.   5. The aortic valve is tricuspid. There is mild calcification of the  aortic valve. Aortic valve regurgitation is trivial.   6. The inferior vena cava is normal in size with greater than 50%  respiratory variability, suggesting right atrial pressure of 3 mmHg.    10/2020 monitor 12 day monitor Rare supraventricular ectopy in the form of isolated PACs, couplets, triplets. Rare episodes of SVT up to 20 beats Rare ventricular ectopy in the form of isolated PVCs, couplets Symptoms correlate with sinus rhythm and rare PACs/PVCs     Patch Wear Time:  12 days and 1 hours (2022-07-07T09:04:25-0400 to 2022-07-19T10:52:45-0400)   Patient had a min HR of 46 bpm, max HR of 193 bpm, and avg HR of 76 bpm. Predominant underlying rhythm was Sinus Rhythm. Bundle Madden Garron Block/IVCD was present. 14 Supraventricular Tachycardia runs occurred, the run with the fastest interval lasting 6  beats with a max rate of 193 bpm, the longest lasting 20 beats with an avg rate of 110 bpm. Isolated SVEs were rare (<1.0%), SVE Couplets were rare (<1.0%), and SVE Triplets were rare (<1.0%). Isolated VEs were rare (<1.0%), VE Couplets were rare  (<1.0%), and no VE Triplets were present. Ventricular Trigeminy was present.     Assessment and Plan   1. Chest pain - long history of symptoms with negative ischemic testing - noncardiac symptoms after swallowing pills, no cardiac testing is indicated EKG SR, no ischemic changes    2. HTN - bp at goal - some recent LE edema, unclear if related to norvasc. Lower norvasc to 2.5mg  daily    3. Hyperlipidemia -limited tolerance to statins, currently  on simva 20mg . Had been on zetia but pcp stopped - request pcp labs   Antoine Poche, M.D.

## 2023-05-21 NOTE — Patient Instructions (Signed)
Medication Instructions:   Decrease Amlodipine to 2.5mg  daily  Zetia removed from list today  Continue all other medications.     Labwork:  none  Testing/Procedures:  none  Follow-Up:  6 months   Any Other Special Instructions Will Be Listed Below (If Applicable).   If you need a refill on your cardiac medications before your next appointment, please call your pharmacy.

## 2023-05-22 ENCOUNTER — Encounter: Payer: Self-pay | Admitting: *Deleted

## 2023-06-25 DIAGNOSIS — H01005 Unspecified blepharitis left lower eyelid: Secondary | ICD-10-CM | POA: Diagnosis not present

## 2023-06-25 DIAGNOSIS — H02831 Dermatochalasis of right upper eyelid: Secondary | ICD-10-CM | POA: Diagnosis not present

## 2023-06-25 DIAGNOSIS — H02822 Cysts of right lower eyelid: Secondary | ICD-10-CM | POA: Diagnosis not present

## 2023-06-25 DIAGNOSIS — H01002 Unspecified blepharitis right lower eyelid: Secondary | ICD-10-CM | POA: Diagnosis not present

## 2023-06-25 DIAGNOSIS — H04123 Dry eye syndrome of bilateral lacrimal glands: Secondary | ICD-10-CM | POA: Diagnosis not present

## 2023-06-25 DIAGNOSIS — H02834 Dermatochalasis of left upper eyelid: Secondary | ICD-10-CM | POA: Diagnosis not present

## 2023-06-25 DIAGNOSIS — H01004 Unspecified blepharitis left upper eyelid: Secondary | ICD-10-CM | POA: Diagnosis not present

## 2023-06-25 DIAGNOSIS — H11823 Conjunctivochalasis, bilateral: Secondary | ICD-10-CM | POA: Diagnosis not present

## 2023-06-25 DIAGNOSIS — H01001 Unspecified blepharitis right upper eyelid: Secondary | ICD-10-CM | POA: Diagnosis not present

## 2023-06-25 DIAGNOSIS — Z961 Presence of intraocular lens: Secondary | ICD-10-CM | POA: Diagnosis not present

## 2023-07-04 ENCOUNTER — Other Ambulatory Visit: Payer: Self-pay

## 2023-07-04 ENCOUNTER — Emergency Department (HOSPITAL_COMMUNITY)

## 2023-07-04 ENCOUNTER — Encounter (HOSPITAL_COMMUNITY): Payer: Self-pay

## 2023-07-04 ENCOUNTER — Inpatient Hospital Stay (HOSPITAL_COMMUNITY)

## 2023-07-04 ENCOUNTER — Inpatient Hospital Stay (HOSPITAL_COMMUNITY)
Admission: EM | Admit: 2023-07-04 | Discharge: 2023-07-08 | DRG: 086 | Disposition: A | Discharge status: Rehabilitation Facility | Attending: Internal Medicine | Admitting: Internal Medicine

## 2023-07-04 DIAGNOSIS — R451 Restlessness and agitation: Secondary | ICD-10-CM | POA: Diagnosis not present

## 2023-07-04 DIAGNOSIS — S0101XA Laceration without foreign body of scalp, initial encounter: Secondary | ICD-10-CM | POA: Diagnosis present

## 2023-07-04 DIAGNOSIS — Z7902 Long term (current) use of antithrombotics/antiplatelets: Secondary | ICD-10-CM | POA: Diagnosis not present

## 2023-07-04 DIAGNOSIS — W19XXXA Unspecified fall, initial encounter: Secondary | ICD-10-CM | POA: Diagnosis present

## 2023-07-04 DIAGNOSIS — S066X0A Traumatic subarachnoid hemorrhage without loss of consciousness, initial encounter: Secondary | ICD-10-CM | POA: Diagnosis not present

## 2023-07-04 DIAGNOSIS — I959 Hypotension, unspecified: Secondary | ICD-10-CM | POA: Diagnosis not present

## 2023-07-04 DIAGNOSIS — Z888 Allergy status to other drugs, medicaments and biological substances status: Secondary | ICD-10-CM | POA: Diagnosis not present

## 2023-07-04 DIAGNOSIS — I6201 Nontraumatic acute subdural hemorrhage: Secondary | ICD-10-CM | POA: Diagnosis not present

## 2023-07-04 DIAGNOSIS — S066XAA Traumatic subarachnoid hemorrhage with loss of consciousness status unknown, initial encounter: Secondary | ICD-10-CM | POA: Diagnosis not present

## 2023-07-04 DIAGNOSIS — I609 Nontraumatic subarachnoid hemorrhage, unspecified: Secondary | ICD-10-CM | POA: Diagnosis not present

## 2023-07-04 DIAGNOSIS — R404 Transient alteration of awareness: Secondary | ICD-10-CM | POA: Diagnosis not present

## 2023-07-04 DIAGNOSIS — Z90710 Acquired absence of both cervix and uterus: Secondary | ICD-10-CM | POA: Diagnosis not present

## 2023-07-04 DIAGNOSIS — Z043 Encounter for examination and observation following other accident: Secondary | ICD-10-CM | POA: Diagnosis not present

## 2023-07-04 DIAGNOSIS — N39498 Other specified urinary incontinence: Secondary | ICD-10-CM | POA: Diagnosis not present

## 2023-07-04 DIAGNOSIS — M4854XA Collapsed vertebra, not elsewhere classified, thoracic region, initial encounter for fracture: Secondary | ICD-10-CM | POA: Diagnosis not present

## 2023-07-04 DIAGNOSIS — I10 Essential (primary) hypertension: Secondary | ICD-10-CM | POA: Diagnosis present

## 2023-07-04 DIAGNOSIS — Z881 Allergy status to other antibiotic agents status: Secondary | ICD-10-CM | POA: Diagnosis not present

## 2023-07-04 DIAGNOSIS — S065X9D Traumatic subdural hemorrhage with loss of consciousness of unspecified duration, subsequent encounter: Secondary | ICD-10-CM | POA: Diagnosis not present

## 2023-07-04 DIAGNOSIS — H04123 Dry eye syndrome of bilateral lacrimal glands: Secondary | ICD-10-CM | POA: Diagnosis present

## 2023-07-04 DIAGNOSIS — I6200 Nontraumatic subdural hemorrhage, unspecified: Secondary | ICD-10-CM | POA: Diagnosis not present

## 2023-07-04 DIAGNOSIS — B965 Pseudomonas (aeruginosa) (mallei) (pseudomallei) as the cause of diseases classified elsewhere: Secondary | ICD-10-CM | POA: Diagnosis not present

## 2023-07-04 DIAGNOSIS — K5909 Other constipation: Secondary | ICD-10-CM | POA: Diagnosis present

## 2023-07-04 DIAGNOSIS — S065X9A Traumatic subdural hemorrhage with loss of consciousness of unspecified duration, initial encounter: Secondary | ICD-10-CM | POA: Diagnosis not present

## 2023-07-04 DIAGNOSIS — N052 Unspecified nephritic syndrome with diffuse membranous glomerulonephritis: Secondary | ICD-10-CM | POA: Diagnosis not present

## 2023-07-04 DIAGNOSIS — M329 Systemic lupus erythematosus, unspecified: Secondary | ICD-10-CM | POA: Diagnosis not present

## 2023-07-04 DIAGNOSIS — Z880 Allergy status to penicillin: Secondary | ICD-10-CM | POA: Diagnosis not present

## 2023-07-04 DIAGNOSIS — S0003XA Contusion of scalp, initial encounter: Secondary | ICD-10-CM | POA: Diagnosis not present

## 2023-07-04 DIAGNOSIS — S065XAA Traumatic subdural hemorrhage with loss of consciousness status unknown, initial encounter: Secondary | ICD-10-CM | POA: Diagnosis present

## 2023-07-04 DIAGNOSIS — E871 Hypo-osmolality and hyponatremia: Secondary | ICD-10-CM | POA: Diagnosis not present

## 2023-07-04 DIAGNOSIS — M3214 Glomerular disease in systemic lupus erythematosus: Secondary | ICD-10-CM | POA: Diagnosis not present

## 2023-07-04 DIAGNOSIS — S065XAD Traumatic subdural hemorrhage with loss of consciousness status unknown, subsequent encounter: Secondary | ICD-10-CM | POA: Diagnosis not present

## 2023-07-04 DIAGNOSIS — E222 Syndrome of inappropriate secretion of antidiuretic hormone: Secondary | ICD-10-CM | POA: Diagnosis not present

## 2023-07-04 DIAGNOSIS — R519 Headache, unspecified: Secondary | ICD-10-CM | POA: Diagnosis not present

## 2023-07-04 DIAGNOSIS — G9341 Metabolic encephalopathy: Secondary | ICD-10-CM | POA: Diagnosis not present

## 2023-07-04 DIAGNOSIS — K5901 Slow transit constipation: Secondary | ICD-10-CM | POA: Diagnosis not present

## 2023-07-04 DIAGNOSIS — K219 Gastro-esophageal reflux disease without esophagitis: Secondary | ICD-10-CM | POA: Diagnosis not present

## 2023-07-04 DIAGNOSIS — Z9181 History of falling: Secondary | ICD-10-CM

## 2023-07-04 DIAGNOSIS — Z818 Family history of other mental and behavioral disorders: Secondary | ICD-10-CM | POA: Diagnosis not present

## 2023-07-04 DIAGNOSIS — E785 Hyperlipidemia, unspecified: Secondary | ICD-10-CM

## 2023-07-04 DIAGNOSIS — Z8673 Personal history of transient ischemic attack (TIA), and cerebral infarction without residual deficits: Secondary | ICD-10-CM | POA: Diagnosis not present

## 2023-07-04 DIAGNOSIS — Y92009 Unspecified place in unspecified non-institutional (private) residence as the place of occurrence of the external cause: Secondary | ICD-10-CM | POA: Diagnosis not present

## 2023-07-04 DIAGNOSIS — R414 Neurologic neglect syndrome: Secondary | ICD-10-CM | POA: Diagnosis not present

## 2023-07-04 DIAGNOSIS — K5900 Constipation, unspecified: Secondary | ICD-10-CM | POA: Diagnosis not present

## 2023-07-04 DIAGNOSIS — R7401 Elevation of levels of liver transaminase levels: Secondary | ICD-10-CM | POA: Diagnosis not present

## 2023-07-04 DIAGNOSIS — S069XAS Unspecified intracranial injury with loss of consciousness status unknown, sequela: Secondary | ICD-10-CM | POA: Diagnosis not present

## 2023-07-04 DIAGNOSIS — Z882 Allergy status to sulfonamides status: Secondary | ICD-10-CM

## 2023-07-04 DIAGNOSIS — M3500 Sicca syndrome, unspecified: Secondary | ICD-10-CM | POA: Diagnosis not present

## 2023-07-04 DIAGNOSIS — Z79899 Other long term (current) drug therapy: Secondary | ICD-10-CM | POA: Diagnosis not present

## 2023-07-04 DIAGNOSIS — F419 Anxiety disorder, unspecified: Secondary | ICD-10-CM | POA: Diagnosis present

## 2023-07-04 DIAGNOSIS — S0633AA Contusion and laceration of cerebrum, unspecified, with loss of consciousness status unknown, initial encounter: Secondary | ICD-10-CM | POA: Diagnosis not present

## 2023-07-04 DIAGNOSIS — E861 Hypovolemia: Secondary | ICD-10-CM | POA: Diagnosis present

## 2023-07-04 DIAGNOSIS — I6523 Occlusion and stenosis of bilateral carotid arteries: Secondary | ICD-10-CM | POA: Diagnosis not present

## 2023-07-04 DIAGNOSIS — S065X9S Traumatic subdural hemorrhage with loss of consciousness of unspecified duration, sequela: Secondary | ICD-10-CM | POA: Diagnosis not present

## 2023-07-04 DIAGNOSIS — R4189 Other symptoms and signs involving cognitive functions and awareness: Secondary | ICD-10-CM | POA: Diagnosis not present

## 2023-07-04 DIAGNOSIS — M50321 Other cervical disc degeneration at C4-C5 level: Secondary | ICD-10-CM | POA: Diagnosis not present

## 2023-07-04 DIAGNOSIS — R5381 Other malaise: Secondary | ICD-10-CM | POA: Diagnosis not present

## 2023-07-04 DIAGNOSIS — E7800 Pure hypercholesterolemia, unspecified: Secondary | ICD-10-CM | POA: Diagnosis present

## 2023-07-04 DIAGNOSIS — B952 Enterococcus as the cause of diseases classified elsewhere: Secondary | ICD-10-CM | POA: Diagnosis not present

## 2023-07-04 DIAGNOSIS — S066X0D Traumatic subarachnoid hemorrhage without loss of consciousness, subsequent encounter: Secondary | ICD-10-CM | POA: Diagnosis not present

## 2023-07-04 DIAGNOSIS — S0101XD Laceration without foreign body of scalp, subsequent encounter: Secondary | ICD-10-CM | POA: Diagnosis not present

## 2023-07-04 DIAGNOSIS — S065X0A Traumatic subdural hemorrhage without loss of consciousness, initial encounter: Principal | ICD-10-CM | POA: Diagnosis present

## 2023-07-04 DIAGNOSIS — E876 Hypokalemia: Secondary | ICD-10-CM | POA: Diagnosis not present

## 2023-07-04 DIAGNOSIS — N390 Urinary tract infection, site not specified: Secondary | ICD-10-CM | POA: Diagnosis not present

## 2023-07-04 DIAGNOSIS — M47812 Spondylosis without myelopathy or radiculopathy, cervical region: Secondary | ICD-10-CM | POA: Diagnosis not present

## 2023-07-04 LAB — URINALYSIS, ROUTINE W REFLEX MICROSCOPIC
Bilirubin Urine: NEGATIVE
Glucose, UA: NEGATIVE mg/dL
Hgb urine dipstick: NEGATIVE
Ketones, ur: NEGATIVE mg/dL
Leukocytes,Ua: NEGATIVE
Nitrite: NEGATIVE
Protein, ur: NEGATIVE mg/dL
Specific Gravity, Urine: 1.011 (ref 1.005–1.030)
pH: 7 (ref 5.0–8.0)

## 2023-07-04 LAB — PROTIME-INR
INR: 1 (ref 0.8–1.2)
Prothrombin Time: 13.2 s (ref 11.4–15.2)

## 2023-07-04 LAB — BASIC METABOLIC PANEL
Anion gap: 9 (ref 5–15)
BUN: 14 mg/dL (ref 8–23)
CO2: 30 mmol/L (ref 22–32)
Calcium: 11.1 mg/dL — ABNORMAL HIGH (ref 8.9–10.3)
Chloride: 98 mmol/L (ref 98–111)
Creatinine, Ser: 0.72 mg/dL (ref 0.44–1.00)
GFR, Estimated: >60 mL/min (ref >60)
Glucose, Bld: 94 mg/dL (ref 70–99)
Potassium: 4.4 mmol/L (ref 3.5–5.1)
Sodium: 137 mmol/L (ref 135–145)

## 2023-07-04 LAB — CBC WITH DIFFERENTIAL/PLATELET
Abs Immature Granulocytes: 0.01 10*3/uL (ref 0.00–0.07)
Basophils Absolute: 0 10*3/uL (ref 0.0–0.1)
Basophils Relative: 1 %
Eosinophils Absolute: 0 10*3/uL (ref 0.0–0.5)
Eosinophils Relative: 1 %
HCT: 46 % (ref 36.0–46.0)
Hemoglobin: 15.5 g/dL — ABNORMAL HIGH (ref 12.0–15.0)
Immature Granulocytes: 0 %
Lymphocytes Relative: 11 %
Lymphs Abs: 0.4 10*3/uL — ABNORMAL LOW (ref 0.7–4.0)
MCH: 31.9 pg (ref 26.0–34.0)
MCHC: 33.7 g/dL (ref 30.0–36.0)
MCV: 94.7 fL (ref 80.0–100.0)
Monocytes Absolute: 0.3 10*3/uL (ref 0.1–1.0)
Monocytes Relative: 9 %
Neutro Abs: 3 10*3/uL (ref 1.7–7.7)
Neutrophils Relative %: 78 %
Platelets: 234 10*3/uL (ref 150–400)
RBC: 4.86 MIL/uL (ref 3.87–5.11)
RDW: 12.4 % (ref 11.5–15.5)
WBC: 3.9 10*3/uL — ABNORMAL LOW (ref 4.0–10.5)
nRBC: 0 % (ref 0.0–0.2)

## 2023-07-04 LAB — MRSA NEXT GEN BY PCR, NASAL: MRSA by PCR Next Gen: NOT DETECTED

## 2023-07-04 MED ORDER — ADULT MULTIVITAMIN W/MINERALS CH
1.0000 | ORAL_TABLET | Freq: Every day | ORAL | Status: DC
  Administered 2023-07-05 – 2023-07-08 (×4): 1 via ORAL
  Filled 2023-07-04 (×5): qty 1

## 2023-07-04 MED ORDER — ACETAMINOPHEN 325 MG PO TABS
650.0000 mg | ORAL_TABLET | ORAL | Status: DC | PRN
  Administered 2023-07-06 – 2023-07-07 (×4): 650 mg via ORAL
  Filled 2023-07-04 (×4): qty 2

## 2023-07-04 MED ORDER — MORPHINE SULFATE (PF) 2 MG/ML IV SOLN
2.0000 mg | INTRAVENOUS | Status: DC | PRN
  Administered 2023-07-06: 2 mg via INTRAVENOUS
  Filled 2023-07-04: qty 1

## 2023-07-04 MED ORDER — VITAMIN C 500 MG PO TABS
1000.0000 mg | ORAL_TABLET | Freq: Every day | ORAL | Status: DC
  Administered 2023-07-05 – 2023-07-08 (×4): 1000 mg via ORAL
  Filled 2023-07-04 (×4): qty 2

## 2023-07-04 MED ORDER — PANTOPRAZOLE SODIUM 40 MG PO TBEC
40.0000 mg | DELAYED_RELEASE_TABLET | Freq: Every day | ORAL | Status: DC
  Administered 2023-07-05 – 2023-07-08 (×4): 40 mg via ORAL
  Filled 2023-07-04 (×4): qty 1

## 2023-07-04 MED ORDER — LACTATED RINGERS IV BOLUS
500.0000 mL | Freq: Once | INTRAVENOUS | Status: AC
  Administered 2023-07-04: 500 mL via INTRAVENOUS

## 2023-07-04 MED ORDER — TRAZODONE HCL 50 MG PO TABS
75.0000 mg | ORAL_TABLET | Freq: Every day | ORAL | Status: DC
  Administered 2023-07-04 – 2023-07-07 (×4): 75 mg via ORAL
  Filled 2023-07-04 (×4): qty 2

## 2023-07-04 MED ORDER — POLYETHYLENE GLYCOL 3350 17 GM/SCOOP PO POWD: 17.0000 g | Freq: Every day | ORAL | Status: DC | PRN

## 2023-07-04 MED ORDER — DOCUSATE SODIUM 100 MG PO CAPS
100.0000 mg | ORAL_CAPSULE | Freq: Every day | ORAL | Status: DC
Start: 2023-07-05 — End: 2023-07-08
  Administered 2023-07-05 – 2023-07-08 (×4): 100 mg via ORAL
  Filled 2023-07-04 (×4): qty 1

## 2023-07-04 MED ORDER — CHLORHEXIDINE GLUCONATE CLOTH 2 % EX PADS
6.0000 | MEDICATED_PAD | Freq: Every day | CUTANEOUS | Status: DC
  Administered 2023-07-04 – 2023-07-08 (×4): 6 via TOPICAL

## 2023-07-04 MED ORDER — LORATADINE 10 MG PO TABS: 10.0000 mg | ORAL_TABLET | Freq: Every day | ORAL | Status: DC | PRN

## 2023-07-04 MED ORDER — LIDOCAINE-EPINEPHRINE (PF) 2 %-1:200000 IJ SOLN
20.0000 mL | Freq: Once | INTRAMUSCULAR | Status: AC
  Administered 2023-07-04: 20 mL
  Filled 2023-07-04: qty 20

## 2023-07-04 MED ORDER — ORAL CARE MOUTH RINSE: 15.0000 mL | OROMUCOSAL | Status: DC | PRN

## 2023-07-04 MED ORDER — POLYVINYL ALCOHOL 1.4 % OP SOLN
2.0000 [drp] | OPHTHALMIC | Status: DC | PRN
  Filled 2023-07-04: qty 15

## 2023-07-04 MED ORDER — AMLODIPINE BESYLATE 2.5 MG PO TABS: 2.5000 mg | ORAL_TABLET | Freq: Every day | ORAL | Status: DC

## 2023-07-04 MED ORDER — ONDANSETRON HCL 4 MG/2ML IJ SOLN: 4.0000 mg | Freq: Four times a day (QID) | INTRAMUSCULAR | Status: DC | PRN

## 2023-07-04 MED ORDER — SODIUM CHLORIDE 0.9 % IV SOLN
15.0000 ug | Freq: Once | INTRAVENOUS | Status: AC
  Administered 2023-07-04: 15.2 ug via INTRAVENOUS
  Filled 2023-07-04: qty 3.8

## 2023-07-04 MED ORDER — SIMVASTATIN 20 MG PO TABS
20.0000 mg | ORAL_TABLET | Freq: Every day | ORAL | Status: DC
  Administered 2023-07-04 – 2023-07-08 (×5): 20 mg via ORAL
  Filled 2023-07-04 (×5): qty 1

## 2023-07-04 MED ORDER — HYDRALAZINE HCL 20 MG/ML IJ SOLN: 10.0000 mg | Freq: Four times a day (QID) | INTRAMUSCULAR | Status: DC | PRN

## 2023-07-04 MED ORDER — FENTANYL CITRATE PF 50 MCG/ML IJ SOSY
50.0000 ug | PREFILLED_SYRINGE | Freq: Once | INTRAMUSCULAR | Status: AC
  Administered 2023-07-04: 50 ug via INTRAVENOUS
  Filled 2023-07-04: qty 1

## 2023-07-04 NOTE — ED Triage Notes (Signed)
 Pt had fall this morning while trying to cook break. Pt hit head and there appears to be 2 lacerations. Pt is on blood thinner. Pt lives at home alone.

## 2023-07-04 NOTE — H&P (Signed)
 NAME:  Peggy Ramirez, MRN:  [id number], DOB:  11/29/1933, LOS: 0 ADMISSION DATE:  07/04/2023,  CHIEF COMPLAINT: Traumatic subdural hematoma/subarachnoid hemorrhage  History of Present Illness:  89 year old woman with a history of Sjogren's, hypertension, SLE, membranous glomerulonephritis (not on immunosuppression), history CVA on Plavix. She experienced a mechanical fall on 3/7 without loss of consciousness.  She was evaluated in the ED at [facility] and trauma workup performed.  CT scan of her head showed posttraumatic intracranial hemorrhage 9 to 10 mm right SDH, scattered bilateral SAH and a posttrauma pattern.  There was no midline shift, no evidence of scalp fracture.  Cervical spine CT did not show any traumatic fracture.  There is chronic cervical spine degeneration C4-C6.  Pertinent  Medical History   Past Medical History:  Diagnosis Date   Acute metabolic encephalopathy    Chronic constipation    Chronic left lower quadrant pain    GERD (gastroesophageal reflux disease)    Headache(784.0)    High cholesterol    Hydrosalpinx    LEFT   Hypertension    Lupus    Membranous glomerulonephritis    followed by Dr. Clelia Schaumann   Ovarian fibroma    RIGHT   Pelvic adhesions    Sjogren's syndrome (HCC)    Stroke (HCC)      Significant Hospital Events: Including procedures, antibiotic start and stop dates in addition to other pertinent events   Head CT 3/8 >> acute posttraumatic intracranial hemorrhage with a right sided subdural hematoma that is lobulated with a maximal thickness of 9-10 mm, generally 4 mm.  Scattered bilateral SAH in a posttraumatic pattern.  No evidence of midline shift or intracranial mass effect.  No skull fracture. CT C-spine 3/8 >> no traumatic injury in the C-spine, chronic cervical spine degeneration C4-C6.  Partially visible T3 compression fracture without any retropulsion, appears to be chronic  Interim History / Subjective:   Denies any headache She does  have some right hip pain  Objective   Blood pressure 131/78, pulse 63, temperature 99.4 F (37.4 C), temperature source Oral, resp. rate 15, height 4\' 11"  (1.499 m), weight 36.6 kg, SpO2 98%.       No intake or output data in the 24 hours ending 07/04/23 1253 Filed Weights   07/04/23 0835 07/04/23 1220  Weight: 45.4 kg 36.6 kg    Examination: General: Pleasant elderly woman in no distress, thin HENT: Oropharynx clear, pupils equal, no stridor or secretions Lungs: Clear bilaterally Cardiovascular: Regular, 60s, no murmur Abdomen: Nondistended, positive bowel sounds Extremities: Bruise on her right upper thigh just below the hip Neuro: Awake, alert, follows commands, moves all extremities GU: Pure wick in place  Resolved Hospital Problem list     Assessment & Plan:   Acute posttraumatic subdural hematoma and subarachnoid hemorrhage without midline shift or significant neurological changes on initial CT done at 9:25 AM -Hold Plavix and any anticoagulation -ICU monitoring -Neuroprotective measures and seizure precautions -SBP goal < 160 -Pain control  Scalp laceration -Stapled in the ED -Pain control  Hypertension -Continue home amlodipine -Hydralazine available  Hyperlipidemia -Continue simvastatin  History of anxiety -Uses Xanax as needed at home.  Will be careful with sedating medications because we do not want to cloud neurological exam  GERD -Continue PPI  History cataracts, chronic dry eyes -Patient uses eye moistening gel 4 times daily  Best Practice (right click and "Reselect all SmartList Selections" daily)   Diet/type: Regular consistency (see orders) DVT prophylaxis SCD Pressure  ulcer(s): N/A GI prophylaxis: PPI Lines: N/A Foley:  N/A Code Status:  full code Last date of multidisciplinary goals of care discussion [discussed w family 3/8]  Labs   CBC: Recent Labs  Lab 07/04/23 0951  WBC 3.9*  NEUTROABS 3.0  HGB 15.5*  HCT 46.0  MCV 94.7   PLT 234    Basic Metabolic Panel: Recent Labs  Lab 07/04/23 0951  NA 137  K 4.4  CL 98  CO2 30  GLUCOSE 94  BUN 14  CREATININE 0.72  CALCIUM 11.1*   GFR: Estimated Creatinine Clearance: 28.1 mL/min (by C-G formula based on SCr of 0.72 mg/dL). Recent Labs  Lab 07/04/23 0951  WBC 3.9*    Liver Function Tests: No results for input(s): "AST", "ALT", "ALKPHOS", "BILITOT", "PROT", "ALBUMIN" in the last 168 hours. No results for input(s): "LIPASE", "AMYLASE" in the last 168 hours. No results for input(s): "AMMONIA" in the last 168 hours.  ABG    Component Value Date/Time   TCO2 26 10/15/2015 1128     Coagulation Profile: Recent Labs  Lab 07/04/23 0951  INR 1.0    Cardiac Enzymes: No results for input(s): "CKTOTAL", "CKMB", "CKMBINDEX", "TROPONINI" in the last 168 hours.  HbA1C: Hgb A1c MFr Bld  Date/Time Value Ref Range Status  10/15/2015 11:20 AM 5.2 4.8 - 5.6 % Final    Comment:    (NOTE)         Pre-diabetes: 5.7 - 6.4         Diabetes: >6.4         Glycemic control for adults with diabetes: <7.0     CBG: No results for input(s): "GLUCAP" in the last 168 hours.  Review of Systems:   As per HPI  Past Medical History:  She,  has a past medical history of Acute metabolic encephalopathy, Chronic constipation, Chronic left lower quadrant pain, GERD (gastroesophageal reflux disease), Headache(784.0), High cholesterol, Hydrosalpinx, Hypertension, Lupus, Membranous glomerulonephritis, Ovarian fibroma, Pelvic adhesions, Sjogren's syndrome (HCC), and Stroke (HCC).   Surgical History:   Past Surgical History:  Procedure Laterality Date   APPENDECTOMY  04/28/2000   BIOPSY  12/17/2020   Procedure: BIOPSY;  Surgeon: Lanelle Bal, DO;  Location: [facility];  Service: Endoscopy;;   COLONOSCOPY WITH PROPOFOL N/A 12/17/2020   Procedure: COLONOSCOPY WITH PROPOFOL;  Surgeon: Lanelle Bal, DO;  Location: [facility];  Service: Endoscopy;  Laterality:  N/A;   FRACTURE SURGERY     HIP PINNING,CANNULATED Left 12/15/2019   Procedure: CANNULATED HIP PINNING;  Surgeon: Roby Lofts, MD;  Location: [facility];  Service: Orthopedics;  Laterality: Left;   LAPAROSCOPIC CHOLECYSTECTOMY  04/28/2000   TONSILLECTOMY  04/29/1939   TOTAL ABDOMINAL HYSTERECTOMY W/ BILATERAL SALPINGOOPHORECTOMY  04/29/2003   VAGINAL HYSTERECTOMY  04/29/1979     Social History:   reports that she has never smoked. She has never used smokeless tobacco. She reports that she does not drink alcohol and does not use drugs.   Family History:  Her family history includes Anxiety disorder in an other family member; Depression in an other family member; Heart disease in an other family member.   Allergies Allergies  Allergen Reactions   Atorvastatin Other (See Comments)    Muscle aches   Cellcept [Mycophenolate Mofetil] Other (See Comments)    unknown   Medrol [Methylprednisolone] Other (See Comments)    unknown   Mycophenolate Mofetil Other (See Comments)    unknown   Penicillins Itching   Sulfonamide Derivatives Nausea  Only   Topiramate Other (See Comments)    unknown   Verapamil Other (See Comments)    Felt shakey   Levofloxacin Rash     Home Medications  Prior to Admission medications   Medication Sig Start Date End Date Taking? Authorizing Provider  ALPRAZolam Prudy Feeler) 0.25 MG tablet Take 0.25 mg by mouth at bedtime as needed for anxiety.    [provider]  amLODipine (NORVASC) 2.5 MG tablet Take 1 tablet (2.5 mg total) by mouth daily. 05/21/23   Antoine Poche, MD  Ascorbic Acid (VITAMIN C) 1000 MG tablet Take 1,000 mg by mouth daily.    [provider]  clopidogrel (PLAVIX) 75 MG tablet Take 1 tablet (75 mg total) by mouth daily. 12/18/20   Vassie Loll, MD  docusate sodium (COLACE) 100 MG capsule Take 100 mg by mouth daily.    [provider]  loratadine (CLARITIN) 10 MG tablet Take 10 mg by mouth daily as needed for allergies.     [provider]  Misc. Devices (3-IN-1 BEDSIDE TOILET) MISC Patient needs 3 in 1 bedside toilet. Recent hip fracture with repair 12/17/19   Roberto Scales D, MD  Multiple Vitamin (MULTIVITAMIN) tablet Take 1 tablet by mouth daily.    [provider]  pantoprazole (PROTONIX) 40 MG tablet Take 40 mg by mouth daily.    [provider]  polyethylene glycol powder (GLYCOLAX/MIRALAX) 17 GM/SCOOP powder Take 17 g by mouth daily as needed for mild constipation. 12/17/20   Vassie Loll, MD  simvastatin (ZOCOR) 20 MG tablet Take 20 mg by mouth daily.    [provider]  traZODone (DESYREL) 50 MG tablet Take 75 mg by mouth at bedtime.    [provider]     Critical care time: 40 minutes     Levy Pupa, MD, PhD 07/04/2023, 12:53 PM [facility] [phone] or if no answer before 7:00PM call [phone] For any issues after 7:00PM please call eLink [phone]

## 2023-07-04 NOTE — Progress Notes (Signed)
 PCCM Interval Note  Reviewed the patient's head CT done at 1415.  The formal read is still pending but it looks like the SDH has increased in size and there is a rim of bleeding that extends anterior posteriorly, question SDH or parenchymal.  Await the final read  Based on this I will order repeat CT scan of the chest to be done at 8 PM. We will let neurosurgery know about the new findings.  Levy Pupa, MD, PhD 07/04/2023, 3:31 PM [name] [phone] or if no answer before 7:00PM call [phone] For any issues after 7:00PM please call eLink [phone]

## 2023-07-04 NOTE — Progress Notes (Signed)
 eLink Physician-Brief Progress Note Patient Name: Peggy Ramirez DOB: Sep 03, 1933 MRN: [id number]   Date of Service  07/04/2023  HPI/Events of Note  BP 79/40 (52). Was given DDAVP at 2108  Neuro exam unchanged UO 500  eICU Interventions  LR bolus 500 x 1 Await head CT     Intervention Category Intermediate Interventions: Hypotension - evaluation and management  Anisah Kuck V. Yuleni Burich 07/04/2023, 10:38 PM

## 2023-07-04 NOTE — ED Notes (Signed)
 Patient transported to CT

## 2023-07-04 NOTE — Progress Notes (Signed)
 Patrici Ranks PA aware of new CTH results. No change in neuro assessment. CCM placed repeat CTH for 2000 tonight. Neurosx in agreement. Will continue to closely monitor neuro status.

## 2023-07-04 NOTE — ED Provider Notes (Signed)
 [place] EMERGENCY DEPARTMENT AT [facility] Provider Note   CSN: [id number] Arrival date & time: 07/04/23  4098     History  Chief Complaint  Patient presents with   Fall    Peggy Ramirez is a 89 y.o. female.  She is brought in by family after a fall at home.  She said she had her walker whether she was cooking breakfast when the walker tipped and she fell with it.  She hit her head.  No loss of consciousness.  No other injuries or complaints.  No new dizziness chest pain shortness of breath numbness weakness.  She is on Plavix.  Family states immunizations are up-to-date, including tetanus  The history is provided by the patient and a relative.  Fall This is a new problem. The current episode started 1 to 2 hours ago. The problem has not changed since onset.Associated symptoms include headaches. Pertinent negatives include no chest pain, no abdominal pain and no shortness of breath. Nothing aggravates the symptoms. Nothing relieves the symptoms. She has tried nothing for the symptoms. The treatment provided no relief.       Home Medications Prior to Admission medications   Medication Sig Start Date End Date Taking? Authorizing Provider  ALPRAZolam Prudy Feeler) 0.25 MG tablet Take 0.25 mg by mouth at bedtime as needed for anxiety.    [provider]  amLODipine (NORVASC) 2.5 MG tablet Take 1 tablet (2.5 mg total) by mouth daily. 05/21/23   Antoine Poche, MD  Ascorbic Acid (VITAMIN C) 1000 MG tablet Take 1,000 mg by mouth daily.    [provider]  clopidogrel (PLAVIX) 75 MG tablet Take 1 tablet (75 mg total) by mouth daily. 12/18/20   Vassie Loll, MD  docusate sodium (COLACE) 100 MG capsule Take 100 mg by mouth daily.    [provider]  loratadine (CLARITIN) 10 MG tablet Take 10 mg by mouth daily as needed for allergies.    [provider]  Misc. Devices (3-IN-1 BEDSIDE TOILET) MISC Patient needs 3 in 1 bedside toilet. Recent hip  fracture with repair 12/17/19   Roberto Scales D, MD  Multiple Vitamin (MULTIVITAMIN) tablet Take 1 tablet by mouth daily.    [provider]  pantoprazole (PROTONIX) 40 MG tablet Take 40 mg by mouth daily.    [provider]  polyethylene glycol powder (GLYCOLAX/MIRALAX) 17 GM/SCOOP powder Take 17 g by mouth daily as needed for mild constipation. 12/17/20   Vassie Loll, MD  simvastatin (ZOCOR) 20 MG tablet Take 20 mg by mouth daily.    [provider]  traZODone (DESYREL) 50 MG tablet Take 75 mg by mouth at bedtime.    [provider]      Allergies    Atorvastatin, Cellcept [mycophenolate mofetil], Medrol [methylprednisolone], Mycophenolate mofetil, Penicillins, Sulfonamide derivatives, Topiramate, Verapamil, and Levofloxacin    Review of Systems   Review of Systems  Respiratory:  Negative for shortness of breath.   Cardiovascular:  Negative for chest pain.  Gastrointestinal:  Negative for abdominal pain.  Neurological:  Positive for headaches.    Physical Exam Updated Vital Signs BP (!) 145/85 (BP Location: Right Arm)   Pulse 67   Temp 98.2 F (36.8 C)   Resp (!) 22   Ht 4\' 11"  (1.499 m)   Wt 45.4 kg   SpO2 99%   BMI 20.20 kg/m  Physical Exam Vitals and nursing note reviewed.  Constitutional:      General: She is not  in acute distress.    Appearance: Normal appearance. She is well-developed.  HENT:     Head: Normocephalic.     Comments: He has a laceration posterior scalp and a small abrasion to forehead Eyes:     Conjunctiva/sclera: Conjunctivae normal.  Cardiovascular:     Rate and Rhythm: Normal rate and regular rhythm.     Heart sounds: No murmur heard. Pulmonary:     Effort: Pulmonary effort is normal. No respiratory distress.     Breath sounds: Normal breath sounds.  Abdominal:     Palpations: Abdomen is soft.     Tenderness: There is no abdominal tenderness. There is no guarding or rebound.  Musculoskeletal:         General: No swelling.     Cervical back: Neck supple.  Skin:    General: Skin is warm and dry.     Capillary Refill: Capillary refill takes less than 2 seconds.  Neurological:     General: No focal deficit present.     Mental Status: She is alert.     Cranial Nerves: No cranial nerve deficit.     Sensory: No sensory deficit.     Motor: No weakness.     ED Results / Procedures / Treatments   Labs (all labs ordered are listed, but only abnormal results are displayed) Labs Reviewed  BASIC METABOLIC PANEL - Abnormal; Notable for the following components:      Result Value   Calcium 11.1 (*)    All other components within normal limits  CBC WITH DIFFERENTIAL/PLATELET - Abnormal; Notable for the following components:   WBC 3.9 (*)    Hemoglobin 15.5 (*)    Lymphs Abs 0.4 (*)    All other components within normal limits  MRSA NEXT GEN BY PCR, NASAL  PROTIME-INR  URINALYSIS, ROUTINE W REFLEX MICROSCOPIC  CBC  BASIC METABOLIC PANEL  MAGNESIUM    EKG None  Radiology CT HEAD WO CONTRAST ( ) Result Date: 07/04/2023 CLINICAL DATA:  Subdural hematoma. EXAM: CT HEAD WITHOUT CONTRAST TECHNIQUE: Contiguous axial images were obtained from the base of the skull through the vertex without intravenous contrast. RADIATION DOSE REDUCTION: This exam was performed according to the departmental dose-optimization program which includes automated exposure control, adjustment of the mA and/or kV according to patient size and/or use of iterative reconstruction technique. COMPARISON:  CT head without contrast 07/04/2023 at 9:25 a.m. FINDINGS: Brain: The right subdural hematoma has significantly increased in size. Measured at the foramen of Monro, and hemorrhage previously measured 4 mm and now measures 9 mm. The largest coronal measurement is at the level of the posterior commissure with hemorrhage measures 12 mm. Previously measured 10 mm at this level. Areas of subarachnoid hemorrhage are present  bilaterally 3 Increased mass effect is present with progressive effacement of the sulci over the right convexity. Partial effacement of the right lateral ventricle is present. No midline shift is present. No significant intraventricular hemorrhage is present. The brainstem and cerebellum are within normal limits. Midline structures are within normal limits. Without significant interval change Vascular: Atherosclerotic calcifications are present within the cavernous internal carotid arteries bilaterally. No hyperdense vessel is present. Skull: Calvarium is intact. No focal lytic or blastic lesions are present. Right parietal scalp hematoma stable. Skin staples are in place. Sinuses/Orbits: Right mastoid effusion is present. The paranasal sinuses and mastoid air cells are otherwise clear. Bilateral lens replacements are noted. Globes and orbits are otherwise unremarkable. IMPRESSION: 1. Significant interval increase in size of  right subdural hematoma. 2. Increased mass effect with progressive effacement of the sulci over the right convexity. 3. Partial effacement of the right lateral ventricle. 4. No midline shift. 5. Areas of subarachnoid hemorrhage bilaterally. Critical Value/emergent results were called by telephone at the time of interpretation on 07/04/2023 at 3:59 pm to provider [facility] , who verbally acknowledged these results. Electronically Signed   By: Marin Roberts M.D.   On: 07/04/2023 15:59   CT Head Wo Contrast Addendum Date: 07/04/2023 ADDENDUM REPORT: 07/04/2023 09:53 ADDENDUM: Study discussed by telephone with Dr. Meridee Score on 07/04/2023 at 0950 hours. Electronically Signed   By: Odessa Fleming M.D.   On: 07/04/2023 09:53   Result Date: 07/04/2023 CLINICAL DATA:  89 year old female status post fall on blood thinners. Struck head with lacerations. EXAM: CT HEAD WITHOUT CONTRAST TECHNIQUE: Contiguous axial images were obtained from the base of the skull through the vertex without intravenous  contrast. RADIATION DOSE REDUCTION: This exam was performed according to the departmental dose-optimization program which includes automated exposure control, adjustment of the mA and/or kV according to patient size and/or use of iterative reconstruction technique. COMPARISON:  Head CT 11/14/2020. FINDINGS: Brain: Broad-based and hyperdense right side subdural hematoma is mildly lobulated and maximal 9-10 mm in thickness, but generally 4 mm elsewhere. Superimposed small volume and scattered but bilateral anterior and superior convexity subarachnoid blood. No midline shift. No significant mass effect on the ventricular system. Basilar cisterns are patent. No cortically based acute infarct identified. Stable gray-white matter differentiation throughout the brain. See additional presence or absence of intracranial hemorrhage in the table below. Vascular: Calcified atherosclerosis at the skull base. No suspicious intracranial vascular hyperdensity. Skull: No acute osseous abnormality identified. Sinuses/Orbits: Visualized paranasal sinuses and mastoids are stable and well aerated. Other: Large right posterior and smaller right anterior scalp hematomas. Soft tissue gas in both locations indicating associated laceration. Underlying calvarium appears stable and intact. Postoperative changes to both globes since 2022, orbits soft tissues otherwise negative. ---------------------------------------------------- Traumatic Brain Injury Risk Stratification Skull Fracture: No - Low/mBIG 1 Subdural Hematoma (SDH): 8mm plus - High/mBIG 3 Subarachnoid Hemorrhage [facility]): multifocal, bilateral - High/mBIG 3 Epidural Hematoma (EDH): No - Low/mBIG 1 Cerebral contusion, intra-axial, intraparenchymal Hemorrhage (IPH): No Intraventricular Hemorrhage (IVH): No - Low/mBIG 1 Midline Shift > 1mm or Edema/effacement of sulci/vents: No - Low/mBIG 1 ---------------------------------------------------- IMPRESSION: 1. Positive for acute  posttraumatic intracranial hemorrhage: - Right side Subdural Hematoma, lobulated with maximal 9-10 mm in thickness, but generally 4 mm. - scattered bilateral SAH in a post trauma pattern. 2. No midline shift or significant intracranial mass effect. 3. No skull fracture.  Right side scalp hematomas and lacerations. Electronically Signed: By: Odessa Fleming M.D. On: 07/04/2023 09:47   CT Cervical Spine Wo Contrast Result Date: 07/04/2023 CLINICAL DATA:  89 year old female status post fall on blood thinners. Struck head with lacerations. EXAM: CT CERVICAL SPINE WITHOUT CONTRAST TECHNIQUE: Multidetector CT imaging of the cervical spine was performed without intravenous contrast. Multiplanar CT image reconstructions were also generated. RADIATION DOSE REDUCTION: This exam was performed according to the departmental dose-optimization program which includes automated exposure control, adjustment of the mA and/or kV according to patient size and/or use of iterative reconstruction technique. COMPARISON:  Head CT today.  CTA head and neck 11/14/2020. FINDINGS: Alignment: Stable since 2022. Mild straightening of lordosis. Cervicothoracic junction alignment is within normal limits. Bilateral posterior element alignment is within normal limits. Skull base and vertebrae: Stable bone mineralization. Mineralization Visualized skull base is  intact. No atlanto-occipital dissociation. C1 and C2 appear chronically degenerated but intact and aligned. No acute osseous abnormality identified in the cervical spine. Soft tissues and spinal canal: No prevertebral fluid or swelling. No visible canal hematoma. Calcified cervical carotid atherosclerosis. Thyroid is stable since 2022, negative for age. Disc levels: Chronic degenerative appearing ankylosis of C4-C5 and C5-C6. Adjacent segment cervical spine degeneration with advanced facet arthropathy including vacuum facet at C6-C7. But capacious CT appearance of the cervical spine at most levels.  Upper chest: Partially visible T3 superior endplate compression fracture is new since 2022 but appears sclerotic and might be chronic. No retropulsion. T1 and T2 levels appear intact. Negative lung apices. IMPRESSION: 1. No acute traumatic injury identified in the cervical spine. 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 2. Partially visible T3 compression fracture is new since 2022 but appears sclerotic and could be chronic. No retropulsion. Electronically Signed   By: Odessa Fleming M.D.   On: 07/04/2023 09:53    Procedures .Critical Care  Performed by: Terrilee Files, MD Authorized by: Terrilee Files, MD   Critical care provider statement:    Critical care time (minutes):  45   Critical care time was exclusive of:  Separately billable procedures and treating other patients   Critical care was necessary to treat or prevent imminent or life-threatening deterioration of the following conditions:  CNS failure or compromise   Critical care was time spent personally by me on the following activities:  Development of treatment plan with patient or surrogate, discussions with consultants, evaluation of patient's response to treatment, examination of patient, obtaining history from patient or surrogate, ordering and performing treatments and interventions, ordering and review of laboratory studies, ordering and review of radiographic studies, pulse oximetry, re-evaluation of patient's condition and review of old charts   I assumed direction of critical care for this patient from another provider in my specialty: no   .Laceration Repair  Date/Time: 07/04/2023 10:32 AM  Performed by: Terrilee Files, MD Authorized by: Terrilee Files, MD   Consent:    Consent obtained:  Verbal   Consent given by:  Patient   Risks, benefits, and alternatives were discussed: yes     Risks discussed:  Infection, nerve damage, poor wound healing, pain, retained foreign body, tendon  damage and vascular damage   Alternatives discussed:  No treatment, delayed treatment and referral Universal protocol:    Procedure explained and questions answered to patient or proxy's satisfaction: yes     Patient identity confirmed:  Verbally with patient Anesthesia:    Anesthesia method:  Local infiltration   Local anesthetic:  Lidocaine 2% WITH epi Laceration details:    Location:  Scalp   Scalp location:  R parietal   Length (cm):  4 Pre-procedure details:    Preparation:  Patient was prepped and draped in usual sterile fashion Treatment:    Area cleansed with:  Saline   Irrigation solution:  Sterile saline Skin repair:    Repair method:  Staples   Number of staples:  4 Approximation:    Approximation:  Close Repair type:    Repair type:  Simple Post-procedure details:    Procedure completion:  Tolerated well, no immediate complications     Medications Ordered in ED Medications  Chlorhexidine Gluconate Cloth 2 % PADS 6 each (6 each Topical Given 07/04/23 1224)  Oral care mouth rinse (has no administration in time range)  amLODipine (NORVASC) tablet 2.5 mg (has no administration  in time range)  simvastatin (ZOCOR) tablet 20 mg (20 mg Oral Given 07/04/23 1652)  traZODone (DESYREL) tablet 75 mg (has no administration in time range)  docusate sodium (COLACE) capsule 100 mg (has no administration in time range)  pantoprazole (PROTONIX) EC tablet 40 mg (has no administration in time range)  polyethylene glycol powder (GLYCOLAX/MIRALAX) container 17 g (has no administration in time range)  ascorbic acid (VITAMIN C) tablet 1,000 mg (has no administration in time range)  multivitamin with minerals tablet 1 tablet (has no administration in time range)  loratadine (CLARITIN) tablet 10 mg (has no administration in time range)  acetaminophen (TYLENOL) tablet 650 mg (has no administration in time range)  ondansetron (ZOFRAN) injection 4 mg (has no administration in time range)   polyvinyl alcohol (LIQUIFILM TEARS) 1.4 % ophthalmic solution 2 drop (has no administration in time range)  hydrALAZINE (APRESOLINE) injection 10 mg (has no administration in time range)  morphine (PF) 2 MG/ML injection 2-4 mg (has no administration in time range)  lidocaine-EPINEPHrine (XYLOCAINE W/EPI) 2 %-1:200000 (PF) injection 20 mL (20 mLs Infiltration Given by Other 07/04/23 1044)  fentaNYL (SUBLIMAZE) injection 50 mcg (50 mcg Intravenous Given 07/04/23 1109)    ED Course/ Medical Decision Making/ A&P Clinical Course as of 07/04/23 1031  Sat Jul 04, 2023  0950 Received a call from radiology that the patient has traumatic subarachnoid and subdural hemorrhage. [MB]  1016 Discussed with neurosurgery PA Maisie Fus covering Dr. Esperanza Richters.  She is recommending ICU admission at [facility], repeat imaging in 6 hours. [MB]  1031 Discussed with intensivist Dr. Delton Coombes who is excepting the patient for admission to ICU at [facility].  He feels 4 N. would be the best if possible [MB]    Clinical Course User Index [MB] Terrilee Files, MD                                 Medical Decision Making Amount and/or Complexity of Data Reviewed Labs: ordered. Radiology: ordered.  Risk Prescription drug management. Decision regarding hospitalization.   This patient complains of fall head injury; this involves an extensive number of treatment Options and is a complaint that carries with it a high risk of complications and morbidity. The differential includes contusion, fracture, intracranial bleed  I ordered, reviewed and interpreted labs, which included CBC with slightly low white count stable hemoglobin, chemistries unremarkable, urinalysis without signs of infection I ordered medication IV pain medicine and reviewed PMP when indicated. I ordered imaging studies which included CT head and cervical spine and I independently    visualized and interpreted imaging which showed acute traumatic subdural  subarachnoid Additional history obtained from patient's son Previous records obtained and reviewed in epic, no recent admissions I consulted neurosurgery Dr. Esperanza Richters and critical care Dr. Delton Coombes and discussed lab and imaging findings and discussed disposition.  Cardiac monitoring reviewed, normal sinus rhythm Social determinants considered, no significant barriers Critical Interventions: Initiation of workup found to have acute intracranial hemorrhage  After the interventions stated above, I reevaluated the patient and found patient to have ICH.  Will need admission to the hospital as she is on blood thinning medication. Admission and further testing considered, patient require admission.  She understands potential need for intervention.  Transferring to [facility] ICU for further management.  Will need serial CT and neurosurgery evaluation.         Final Clinical Impression(s) / ED Diagnoses Final diagnoses:  Subarachnoid  hemorrhage following injury, no loss of consciousness, initial encounter [facility])  Traumatic subdural hemorrhage without loss of consciousness, initial encounter [facility])    Rx / DC Orders ED Discharge Orders     None         Terrilee Files, MD 07/04/23 1757

## 2023-07-04 NOTE — Progress Notes (Signed)
 Pt presented to ED after fall. On Plavix for h/o CVA.  Nonfocal neuro exam per ED MD. CTH revealing R SDH and scattered SAH w/o significant mass effect or MLS. Recommend transfer to [facility] ICU. Repeat CTH 6H. SBP<160.   [facility]gner Margaree Mackintosh, PA-C

## 2023-07-04 NOTE — Progress Notes (Signed)
 I personally reviewed repeat CT head results. Her previously small R subdural hematoma has increased in size/thickness, now 1 cm.  However, no significant mass effect.  Her scattered contusions/SAH have shown expected evolution.  I have recommended 0.4 mcg/kg IV DDAVP be administered.  She would not likely do well with any large craniotomy given her age and chronic ischemic burden in her brain evident on CT.

## 2023-07-05 DIAGNOSIS — S066X0A Traumatic subarachnoid hemorrhage without loss of consciousness, initial encounter: Secondary | ICD-10-CM | POA: Diagnosis not present

## 2023-07-05 DIAGNOSIS — S0101XA Laceration without foreign body of scalp, initial encounter: Secondary | ICD-10-CM | POA: Diagnosis not present

## 2023-07-05 DIAGNOSIS — I10 Essential (primary) hypertension: Secondary | ICD-10-CM | POA: Diagnosis not present

## 2023-07-05 LAB — CBC
HCT: 33.1 % — ABNORMAL LOW (ref 36.0–46.0)
Hemoglobin: 11.2 g/dL — ABNORMAL LOW (ref 12.0–15.0)
MCH: 31.8 pg (ref 26.0–34.0)
MCHC: 33.8 g/dL (ref 30.0–36.0)
MCV: 94 fL (ref 80.0–100.0)
Platelets: 183 10*3/uL (ref 150–400)
RBC: 3.52 MIL/uL — ABNORMAL LOW (ref 3.87–5.11)
RDW: 12.5 % (ref 11.5–15.5)
WBC: 5.8 10*3/uL (ref 4.0–10.5)
nRBC: 0 % (ref 0.0–0.2)

## 2023-07-05 LAB — BASIC METABOLIC PANEL
Anion gap: 6 (ref 5–15)
BUN: 18 mg/dL (ref 8–23)
CO2: 27 mmol/L (ref 22–32)
Calcium: 9.8 mg/dL (ref 8.9–10.3)
Chloride: 102 mmol/L (ref 98–111)
Creatinine, Ser: 0.81 mg/dL (ref 0.44–1.00)
GFR, Estimated: >60 mL/min (ref >60)
Glucose, Bld: 145 mg/dL — ABNORMAL HIGH (ref 70–99)
Potassium: 3.3 mmol/L — ABNORMAL LOW (ref 3.5–5.1)
Sodium: 135 mmol/L (ref 135–145)

## 2023-07-05 LAB — MAGNESIUM: Magnesium: 2.2 mg/dL (ref 1.7–2.4)

## 2023-07-05 MED ORDER — LACTATED RINGERS IV BOLUS
500.0000 mL | Freq: Once | INTRAVENOUS | Status: AC
  Administered 2023-07-05: 500 mL via INTRAVENOUS

## 2023-07-05 MED ORDER — ALPRAZOLAM 0.25 MG PO TABS
0.2500 mg | ORAL_TABLET | Freq: Every day | ORAL | Status: DC | PRN
  Administered 2023-07-05 – 2023-07-07 (×3): 0.25 mg via ORAL
  Filled 2023-07-05 (×3): qty 1

## 2023-07-05 MED ORDER — POTASSIUM CHLORIDE CRYS ER 20 MEQ PO TBCR
60.0000 meq | EXTENDED_RELEASE_TABLET | Freq: Once | ORAL | Status: AC
  Administered 2023-07-05: 60 meq via ORAL
  Filled 2023-07-05: qty 3

## 2023-07-05 MED ORDER — LEVETIRACETAM 500 MG PO TABS
500.0000 mg | ORAL_TABLET | Freq: Two times a day (BID) | ORAL | Status: DC
  Administered 2023-07-05 – 2023-07-08 (×7): 500 mg via ORAL
  Filled 2023-07-05 (×7): qty 1

## 2023-07-05 NOTE — Progress Notes (Signed)
 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 She experienced a mechanical fall on 3/7 without loss of consciousness.  She was evaluated in the ED at [facility] and trauma workup performed.  CT scan of her head showed posttraumatic intracranial hemorrhage 9 to 10 mm right SDH, scattered bilateral SAH and a posttrauma pattern.  There was no midline shift, no evidence of scalp fracture.  Cervical spine CT did not show any traumatic fracture.  There is chronic cervical spine degeneration C4-C6.  Pertinent  Medical History   Past Medical History:  Diagnosis Date   Acute metabolic encephalopathy    Chronic constipation    Chronic left lower quadrant pain    GERD (gastroesophageal reflux disease)    Headache(784.0)    High cholesterol    Hydrosalpinx    LEFT   Hypertension    Lupus    Membranous glomerulonephritis    followed by Dr. Clelia Schaumann   Ovarian fibroma    RIGHT   Pelvic adhesions    Sjogren's syndrome (HCC)    Stroke (HCC)      Significant Hospital Events: Including procedures, antibiotic start and stop dates in addition to other pertinent events   Head CT 3/8 >> acute posttraumatic intracranial hemorrhage with a right sided subdural hematoma that is lobulated with a maximal thickness of 9-10 mm, generally 4 mm.  Scattered bilateral SAH in a posttraumatic pattern.  No evidence of midline shift or intracranial mass effect.  No skull fracture. CT C-spine 3/8 >> no traumatic injury in the C-spine, chronic cervical spine degeneration C4-C6.  Partially visible T3 compression fracture without any retropulsion, appears to be chronic CT head 3/8 14:19 >> significant interval increase in right  subdural hematoma with increase mass effect. No midline shift CT head 3/8 20:13 >> Stable right subdural hematoma and scattered SAH, similar mass effect  Interim History / Subjective:   CT with increased right SDH to 1 cm without mass effect. NSGY gave DDVAP Neuro exam stable overnight Episodes of hypotension and given 500 cc bolus x 2  Objective   Blood pressure (!) 93/46, pulse (!) 54, temperature 98.2 F (36.8 C), temperature source Axillary, resp. rate 15, height 4\' 11"  (1.499 m), weight 36.7 kg, SpO2 96%.        Intake/Output Summary (Last 24 hours) at 07/05/2023 0752 Last data filed at 07/05/2023 0533 Gross per 24 hour  Intake 550 ml  Output 800 ml  Net -250 ml   Filed Weights   07/04/23 0835 07/04/23 1220 07/05/23 0500  Weight: 45.4 kg 36.6 kg 36.7 kg   Physical Exam: General: Elderly-appearing, no acute distress HENT: [place], AT, OP clear, MMM Eyes: EOMI, no scleral icterus Respiratory: Clear to auscultation bilaterally.  No crackles, wheezing or rales Cardiovascular: RRR, -M/R/G, no JVD GI: BS+, soft, nontender Extremities:-Edema,-tenderness, bruising right upper thigh Neuro: AAO x4, CNII-XII grossly intact GU: External foley in place  Labs pending, prior CBC/BMET unremarkable INR normal  Resolved Hospital Problem list     Assessment & Plan:   Acute posttraumatic subdural hematoma and subarachnoid hemorrhage without midline shift or significant neurological changes on initial CT done at 9:25 AM. Repeat CT head at 14:19 in increase in right subdural hematoma with mass effect. NSGY gave DDAVP. F/u CT  head 20:13 stable. -NSGY following. Imaging per consult team or if neuro status worsens -Hold Plavix and anticoagulation -ICU monitoring -Neuroprotective measures and seizure precautions -SBP goal < 160 -Pain control  Scalp laceration -Stapled in the ED -Pain control  Hypertension -Hold home amlodipine -Hydralazine available  Hyperlipidemia -Continue  simvastatin  History of anxiety -Uses Xanax as needed at home.  Will be careful with sedating medications because we do not want to cloud neurological exam  GERD -Continue PPI  History cataracts, chronic dry eyes -Patient uses eye moistening gel 4 times daily  Best Practice (right click and "Reselect all SmartList Selections" daily)   Diet/type: Regular consistency (see orders) DVT prophylaxis SCD Pressure ulcer(s): N/A GI prophylaxis: PPI Lines: N/A Foley:  N/A Code Status:  full code Last date of multidisciplinary goals of care discussion [discussed w family 3/8]  Critical care time: 38 minutes    The patient is critically ill with multiple organ systems failure and requires high complexity decision making for assessment and support, frequent evaluation and titration of therapies, application of advanced monitoring technologies and extensive interpretation of multiple databases.  Independent Critical Care Time: 38 Minutes.   Mechele Collin, M.D. [facility] Pulmonary/Critical Care Medicine 07/05/2023 7:52 AM   Please see Amion for pager number to reach on-call Pulmonary and Critical Care Team.

## 2023-07-05 NOTE — Consult Note (Signed)
 CC: hit head  HPI:     Patient is a 89 y.o. female with hx of CVA on plavix, Sjogren's, SLE, MGN who suffered a fall on 3/7.  She went to the ER and CT head showed scattered small contusions, small R SDH.  She was transferred to [facility] where subsequent CT showed her R SDH had enlarged.  Third CT showed stability.  She currently has no headaches or neurologic complaints.    Patient Active Problem List   Diagnosis Date Noted   Traumatic subarachnoid hemorrhage (HCC) 07/04/2023   SDH (subdural hematoma) (HCC) 07/04/2023   Lower GI bleed 12/15/2020   Acute metabolic encephalopathy 11/13/2020   Altered mental status 11/12/2020   GERD (gastroesophageal reflux disease) 11/12/2020   Hypokalemia 12/15/2019   Chronic constipation 12/15/2019   Acute urinary retention 12/15/2019   Closed left hip fracture (HCC) 12/14/2019   Essential hypertension 01/20/2018   Acute CVA (cerebrovascular accident) (HCC) 10/16/2015   Left hand weakness 10/15/2015   Ptosis 12/22/2012   Weakness 12/22/2012   Primary hyperparathyroidism (HCC) 08/05/2012   Chest pain 03/18/2010   CONJUNCTIVITIS, ACUTE 04/26/2008   MEMBRANOUS GLOMERULONEPHRITIS 04/26/2008   SJOGREN'S SYNDROME 04/26/2008   ABDOMINAL PAIN, CHRONIC 04/26/2008   NONSPECIFIC ABNORMAL UNSPEC CV FUNCTION STUDY 04/26/2008   Past Medical History:  Diagnosis Date   Acute metabolic encephalopathy    Chronic constipation    Chronic left lower quadrant pain    GERD (gastroesophageal reflux disease)    Headache(784.0)    High cholesterol    Hydrosalpinx    LEFT   Hypertension    Lupus    Membranous glomerulonephritis    followed by Dr. Clelia Schaumann   Ovarian fibroma    RIGHT   Pelvic adhesions    Sjogren's syndrome (HCC)    Stroke [facility])     Past Surgical History:  Procedure Laterality Date   APPENDECTOMY  04/28/2000   BIOPSY  12/17/2020   Procedure: BIOPSY;  Surgeon: Lanelle Bal, DO;  Location: [facility] ENDO SUITE;  Service: Endoscopy;;    COLONOSCOPY WITH PROPOFOL N/A 12/17/2020   Procedure: COLONOSCOPY WITH PROPOFOL;  Surgeon: Lanelle Bal, DO;  Location: [facility] ENDO SUITE;  Service: Endoscopy;  Laterality: N/A;   FRACTURE SURGERY     HIP PINNING,CANNULATED Left 12/15/2019   Procedure: CANNULATED HIP PINNING;  Surgeon: Roby Lofts, MD;  Location: [facility] OR;  Service: Orthopedics;  Laterality: Left;   LAPAROSCOPIC CHOLECYSTECTOMY  04/28/2000   TONSILLECTOMY  04/29/1939   TOTAL ABDOMINAL HYSTERECTOMY W/ BILATERAL SALPINGOOPHORECTOMY  04/29/2003   VAGINAL HYSTERECTOMY  04/29/1979    Medications Prior to Admission  Medication Sig Dispense Refill Last Dose/Taking   ALPRAZolam (XANAX) 0.25 MG tablet Take 0.25 mg by mouth at bedtime as needed for anxiety.   07/03/2023   amLODipine (NORVASC) 2.5 MG tablet Take 1 tablet (2.5 mg total) by mouth daily. 90 tablet 1 07/04/2023   Ascorbic Acid (VITAMIN C) 1000 MG tablet Take 1,000 mg by mouth daily.   07/04/2023   Carboxymethylcellulose Sodium (REFRESH LIQUIGEL OP) Place 1 drop into both eyes in the morning, at noon, in the evening, and at bedtime. Gel drops   07/04/2023   clopidogrel (PLAVIX) 75 MG tablet Take 1 tablet (75 mg total) by mouth daily. 30 tablet 1 07/03/2023   docusate sodium (COLACE) 100 MG capsule Take 100 mg by mouth daily as needed for moderate constipation.   Taking As Needed   loratadine (CLARITIN) 10 MG tablet Take 10 mg by mouth daily  as needed for allergies.   07/04/2023   meloxicam (MOBIC) 7.5 MG tablet Take 7.5 mg by mouth daily.   Taking   Multiple Vitamin (MULTIVITAMIN) tablet Take 1 tablet by mouth daily.   07/04/2023   pantoprazole (PROTONIX) 40 MG tablet Take 40 mg by mouth daily.   07/04/2023   polyethylene glycol powder (GLYCOLAX/MIRALAX) 17 GM/SCOOP powder Take 17 g by mouth daily as needed for mild constipation. 255 g 0 Taking As Needed   simvastatin (ZOCOR) 20 MG tablet Take 20 mg by mouth daily.   07/03/2023 Bedtime   traZODone (DESYREL) 50 MG tablet Take 75 mg by mouth  at bedtime.   07/03/2023   Misc. Devices (3-IN-1 BEDSIDE TOILET) MISC Patient needs 3 in 1 bedside toilet. Recent hip fracture with repair 1 each 1    Allergies  Allergen Reactions   Atorvastatin Other (See Comments)    Muscle aches   Cellcept [Mycophenolate Mofetil] Other (See Comments)    unknown   Medrol [Methylprednisolone] Other (See Comments)    unknown   Mycophenolate Mofetil Other (See Comments)    unknown   Penicillins Itching   Sulfonamide Derivatives Nausea Only   Topiramate Other (See Comments)    unknown   Verapamil Other (See Comments)    Felt shakey   Levofloxacin Rash    Social History   Tobacco Use   Smoking status: Never   Smokeless tobacco: Never  Substance Use Topics   Alcohol use: No    Alcohol/week: 0.0 standard drinks of alcohol    Family History  Problem Relation Age of Onset   Heart disease Other    Anxiety disorder Other    Depression Other      Review of Systems Pertinent items are noted in HPI.  Objective:   Patient Vitals for the past 8 hrs:  BP Temp Temp src Pulse Resp SpO2 Weight  07/05/23 0830 118/77 -- -- 65 16 96 % --  07/05/23 0800 (!) 106/47 99.6 F (37.6 C) Axillary (!) 57 15 96 % --  07/05/23 0730 (!) 93/46 -- -- (!) 54 15 96 % --  07/05/23 0700 (!) 103/45 -- -- (!) 58 14 96 % --  07/05/23 0630 (!) 103/51 -- -- (!) 58 15 97 % --  07/05/23 0600 (!) 90/51 -- -- (!) 56 15 96 % --  07/05/23 0530 133/81 -- -- 75 (!) 24 95 % --  07/05/23 0500 (!) 100/44 -- -- (!) 54 14 96 % 36.7 kg  07/05/23 0400 (!) 94/44 98.2 F (36.8 C) Axillary (!) 58 14 97 % --   I/O last 3 completed shifts: In: 550 [IV Piggyback:550] Out: 800 [Urine:800] No intake/output data recorded.      General : Alert, cooperative, no distress, appears stated age   Head:  Subgaleal hematoma   Eyes: PERRL, conjunctiva/corneas clear, EOM's intact. Fundi could not be visualized Neck: Supple Chest:  Respirations unlabored Chest wall: no tenderness or  deformity Heart: Regular rate and rhythm Abdomen: Soft, nontender and nondistended Extremities: warm and well-perfused Skin: normal turgor, color and texture Neurologic:  Alert, oriented x 3.  Eyes open spontaneously. PERRL, EOMI, VFC, no facial droop. V1-3 intact.  No dysarthria, tongue protrusion symmetric.  CNII-XII intact. Normal strength, sensation and reflexes throughout.  No pronator drift, full strength in legs       Data ReviewCBC:  Lab Results  Component Value Date   WBC 5.8 07/05/2023   RBC 3.52 (L) 07/05/2023   BMP:  Lab  Results  Component Value Date   GLUCOSE 145 (H) 07/05/2023   CO2 27 07/05/2023   BUN 18 07/05/2023   CREATININE 0.81 07/05/2023   CALCIUM 9.8 07/05/2023   Radiology review:  Three Cts were personally reviewed.  Scattered small contusions/SAH, acute R SDH.  These are stable on most recent CT head  Assessment:   Principal Problem:   Traumatic subarachnoid hemorrhage (HCC) Active Problems:   SDH (subdural hematoma) (HCC)  This is a 89 y.o. female with hx of CVA on plavix, Sjogren's, SLE, MGN with an acute R SDH and scattered tSAH/contusions, stable on CT  Plan:   - cont ICU neuromonitoring for now - PT/OT - continue to hold Plavix - can possibly discharge tomorrow - will need f/u CT head without contrast in 2 weeks or sooner if neurologic changes - Keppra x 7 days  Plan of care was discussed with patient and son at the bedside.

## 2023-07-05 NOTE — Progress Notes (Addendum)
 Spoke with CCM MD multiple times throughout the night about pt's SBP being < 90 and MAP < 65.  Pt was sleeping when each reading was low.  After 2 separate 500 mL LR boluses, MAP remains below 65, but SBP is now above 90.  Per MD, okay if MAP is below 65 as long as SBP is > 90.  Current BP 100/44 (57).

## 2023-07-05 NOTE — TOC CAGE-AID Note (Signed)
 Transition of Care [facility]) - CAGE-AID Screening   Patient Details  Name: Peggy Ramirez MRN: [id number] Date of Birth: [birth year]  Transition of Care [facility]) CM/SW Contact:    Janora Norlander, RN Phone Number: [phone] 07/05/2023, 4:43 PM   Clinical Narrative: Pt here in hospital after sustaining a traumatic SAH/SDH due to a fall.  Pt denies alcohol or drug use.  Screening complete.   CAGE-AID Screening:    Have You Ever Felt You Ought to Cut Down on Your Drinking or Drug Use?: No Have People Annoyed You By Critizing Your Drinking Or Drug Use?: No Have You Felt Bad Or Guilty About Your Drinking Or Drug Use?: No Have You Ever Had a Drink or Used Drugs First Thing In The Morning to Steady Your Nerves or to Get Rid of a Hangover?: No CAGE-AID Score: 0  Substance Abuse Education Offered: No

## 2023-07-05 NOTE — Progress Notes (Signed)
 eLink Physician-Brief Progress Note Patient Name: Peggy Ramirez DOB: 28-Aug-1933 MRN: [id number]   Date of Service  07/05/2023  HPI/Events of Note  SBP 90s when awake, but drops down to 80s (MAP 54) when asleep.  Had received bolus earlier this evening with improvement.   eICU Interventions  Ordered another LR oblus.         [name] 07/05/2023, 3:07 AM

## 2023-07-06 DIAGNOSIS — E871 Hypo-osmolality and hyponatremia: Secondary | ICD-10-CM | POA: Diagnosis not present

## 2023-07-06 DIAGNOSIS — S066X0A Traumatic subarachnoid hemorrhage without loss of consciousness, initial encounter: Secondary | ICD-10-CM | POA: Diagnosis not present

## 2023-07-06 LAB — BASIC METABOLIC PANEL
Anion gap: 8 (ref 5–15)
BUN: 11 mg/dL (ref 8–23)
CO2: 23 mmol/L (ref 22–32)
Calcium: 10.3 mg/dL (ref 8.9–10.3)
Chloride: 97 mmol/L — ABNORMAL LOW (ref 98–111)
Creatinine, Ser: 0.59 mg/dL (ref 0.44–1.00)
GFR, Estimated: >60 mL/min (ref >60)
Glucose, Bld: 102 mg/dL — ABNORMAL HIGH (ref 70–99)
Potassium: 4.1 mmol/L (ref 3.5–5.1)
Sodium: 128 mmol/L — ABNORMAL LOW (ref 135–145)

## 2023-07-06 MED ORDER — SODIUM CHLORIDE 1 G PO TABS
1.0000 g | ORAL_TABLET | Freq: Three times a day (TID) | ORAL | Status: DC
  Administered 2023-07-06 – 2023-07-08 (×7): 1 g via ORAL
  Filled 2023-07-06 (×7): qty 1

## 2023-07-06 NOTE — Evaluation (Signed)
 Occupational Therapy Evaluation Patient Details Name: Peggy Ramirez MRN: [id number] DOB: 1933/05/22 Today's Date: 07/06/2023   History of Present Illness   Pt presenting 3/8 s/p fall at home hitting her head; denies LOC. CT head showed scattered small contusions, small R SDH. She was transferred to [facility] where subsequent CT showed her R SDH had enlarged. Third CT showed stability. PMH significant for Sjogren's, HTN, SLE, membranous glomerulonephritis, history CVA on Plavix.     Clinical Impressions PTA, pt lived alone and was mod I within the home. Upon eval, pt with decreased safety, initiation, awareness, strength, and cognition. Pt needing min A for bed mobility and up to mod A for transfers. Pt needing cues for sequencing basic daily tasks such as grooming and toileting this session. Due to pt previously independent and with good support at home, recommending inpatient rehab >3 hours/day to optimize safety and independence with ADL and IADL.     If plan is discharge home, recommend the following:   A lot of help with walking and/or transfers;A lot of help with bathing/dressing/bathroom;Assistance with cooking/housework;Direct supervision/assist for medications management;Direct supervision/assist for financial management;Assist for transportation;Help with stairs or ramp for entrance     Functional Status Assessment   Patient has had a recent decline in their functional status and demonstrates the ability to make significant improvements in function in a reasonable and predictable amount of time.     Equipment Recommendations   BSC/3in1     Recommendations for Other Services         Precautions/Restrictions   Precautions Precautions: None Restrictions Weight Bearing Restrictions Per Provider Order: No     Mobility Bed Mobility Overal bed mobility: Needs Assistance Bed Mobility: Supine to Sit     Supine to sit: Min assist     General bed mobility comments:  min A for guidance of BLE and trunk    Transfers Overall transfer level: Needs assistance Equipment used: 1 person hand held assist, 2 person hand held assist Transfers: Sit to/from Stand, Bed to chair/wheelchair/BSC Sit to Stand: Min assist Stand pivot transfers: Min assist                Balance Overall balance assessment: Needs assistance Sitting-balance support: No upper extremity supported, Bilateral upper extremity supported, Single extremity supported, Feet supported Sitting balance-Leahy Scale: Poor     Standing balance support: Single extremity supported, Bilateral upper extremity supported, During functional activity Standing balance-Leahy Scale: Poor                             ADL either performed or assessed with clinical judgement   ADL Overall ADL's : Needs assistance/impaired     Grooming: Oral care;Moderate assistance;Bed level Grooming Details (indicate cue type and reason): mod cues for sequencing, opening eyes to be able to place dentures back in mouth Upper Body Bathing: Minimal assistance;Sitting   Lower Body Bathing: Maximal assistance;Sit to/from stand   Upper Body Dressing : Minimal assistance;Sitting   Lower Body Dressing: Maximal assistance;Sit to/from stand   Toilet Transfer: Minimal assistance;+2 for physical assistance;+2 for safety/equipment;Stand-pivot           Functional mobility during ADLs: Minimal assistance       Vision Baseline Vision/History: 1 Wears glasses Ability to See in Adequate Light: 0 Adequate Patient Visual Report: No change from baseline Vision Assessment?: Vision impaired- to be further tested in functional context Additional Comments: Pt able to read signage on restroom  door and denying any diplopia, but multimodal cues for transition from one surface to next and pt observed to cfrequently close L eye when focusing although reading with bil eyes open     Perception         Praxis          Pertinent Vitals/Pain Pain Assessment Pain Assessment: Faces Faces Pain Scale: Hurts even more Pain Location: HA Pain Descriptors / Indicators: Headache Pain Intervention(s): Limited activity within patient's tolerance, Monitored during session     Extremity/Trunk Assessment Upper Extremity Assessment Upper Extremity Assessment: Generalized weakness;Right hand dominant;LUE deficits/detail LUE Deficits / Details: decr initiation as opposed to R; holding in flexed position but uses functionally with time and cues.   Lower Extremity Assessment Lower Extremity Assessment: Defer to PT evaluation   Cervical / Trunk Assessment Cervical / Trunk Assessment: Kyphotic   Communication Communication Communication: Impaired Factors Affecting Communication: Hearing impaired (not very conversational but is able to verbalize in full sentences)   Cognition Arousal: Lethargic Behavior During Therapy: WFL for tasks assessed/performed, Impulsive Cognition: Cognition impaired   Orientation impairments: Situation (pt oriented to place, but constantly perseverating on needing to cook breakfast despite frequent reorientation to being in the hospital) Awareness: Intellectual awareness intact ("when am I going to be able to do this on my own")   Attention impairment (select first level of impairment): Focused attention Executive functioning impairment (select all impairments): Organization, Problem solving OT - Cognition Comments: follows basic one step commands with increaed time. pt needing cues to sequencing during mobility esp bed mobility.                 Following commands: Impaired Following commands impaired: Follows one step commands with increased time     Cueing  General Comments   Cueing Techniques: Verbal cues;Gestural cues;Tactile cues  VSS   Exercises     Shoulder Instructions      Home Living Family/patient expects to be discharged to:: Private residence Living  Arrangements: Alone Available Help at Discharge: Family;Available PRN/intermittently ("most of the time") Type of Home: House Home Access: Stairs to enter [employer] of Steps: 1 to porch and then threahold Entrance Stairs-Rails: None Home Layout: One level     Bathroom Shower/Tub: Producer, television/film/video: Handicapped height     Home Equipment: Cane - single Librarian, academic (2 wheels);BSC/3in1   Additional Comments: son present in room providing history      Prior Functioning/Environment Prior Level of Function : Independent/Modified Independent             Mobility Comments: does not typically use AD but recently using walker ADLs Comments: does not drive, family takes her grocery shopping    OT Problem List: Decreased strength;Decreased activity tolerance;Impaired balance (sitting and/or standing);Decreased cognition;Decreased safety awareness;Decreased knowledge of use of DME or AE   OT Treatment/Interventions: Self-care/ADL training;Therapeutic exercise;DME and/or AE instruction;Balance training;Patient/family education;Therapeutic activities;Cognitive remediation/compensation      OT Goals(Current goals can be found in the care plan section)   Acute Rehab OT Goals Patient Stated Goal: get better OT Goal Formulation: With patient Time For Goal Achievement: 07/20/23 Potential to Achieve Goals: Good   OT Frequency:  Min 1X/week    Co-evaluation              AM-PAC OT "6 Clicks" Daily Activity     Outcome Measure Help from another person eating meals?: A Little Help from another person taking care of personal grooming?: A Little Help from  another person toileting, which includes using toliet, bedpan, or urinal?: A Lot Help from another person bathing (including washing, rinsing, drying)?: A Lot Help from another person to put on and taking off regular upper body clothing?: A Little Help from another person to put on and taking  off regular lower body clothing?: A Lot 6 Click Score: 15   End of Session Equipment Utilized During Treatment: Gait belt Nurse Communication: Mobility status  Activity Tolerance: Patient tolerated treatment well Patient left: in chair;with call bell/phone within reach;with chair alarm set  OT Visit Diagnosis: Unsteadiness on feet (R26.81);Muscle weakness (generalized) (M62.81);Other symptoms and signs involving cognitive function;Pain Pain - part of body:  (HA)                Time: 1000-1020 OT Time Calculation (min): 20 min Charges:  OT General Charges $OT Visit: 1 Visit OT Evaluation $OT Eval Moderate Complexity: 1 Mod  Tyler Deis, OTR/L [facility] Acute Rehabilitation Office: [phone]  Myrla Halsted 07/06/2023, 10:57 AM

## 2023-07-06 NOTE — Evaluation (Signed)
 Physical Therapy Evaluation Patient Details Name: Peggy Ramirez MRN: [id number] DOB: [birth year] Today's Date: 07/06/2023  History of Present Illness  Pt presenting 3/8 s/p fall at home hitting her head; denies LOC. CT head showed scattered small contusions, small R SDH. She was transferred to [facility] where subsequent CT showed her R SDH had enlarged. Third CT showed stability. PMH significant for Sjogren's, HTN, SLE, membranous glomerulonephritis, history CVA on Plavix.   Clinical Impression  Peggy Ramirez is 89 y.o. female admitted with above HPI and diagnosis. Patient is currently limited by functional impairments below (see PT problem list). Patient lives alone and is independent at baseline with recent use of RW for mobility. Currently pt is limited by poor activity tolerance, fatigue, weakness, impaired cognition, coordination, and balance. Pt required min assist for transfers and min-mod assist for stability with small steps to move BSC>recliner. Patient will benefit from continued skilled PT interventions to address impairments and progress independence with mobility. Patient will benefit from intensive inpatient follow-up therapy, >3 hours/day. Acute PT will follow and progress as able.         If plan is discharge home, recommend the following: A lot of help with walking and/or transfers;A little help with bathing/dressing/bathroom;Assistance with cooking/housework;Assist for transportation;Help with stairs or ramp for entrance;Supervision due to cognitive status;Direct supervision/assist for medications management   Can travel by private vehicle        Equipment Recommendations  (TBD at next venue)  Recommendations for Other Services  Rehab consult    Functional Status Assessment Patient has had a recent decline in their functional status and demonstrates the ability to make significant improvements in function in a reasonable and predictable amount of time.     Precautions /  Restrictions Precautions Precautions: None Restrictions Weight Bearing Restrictions Per Provider Order: No      Mobility  Bed Mobility               General bed mobility comments: seated EOB with OT at start    Transfers Overall transfer level: Needs assistance Equipment used: 1 person hand held assist, 2 person hand held assist Transfers: Sit to/from Stand, Bed to chair/wheelchair/BSC Sit to Stand: Min assist Stand pivot transfers: Min assist         General transfer comment: min assist for power up with pt holding/hugginh therapist for support. slight posterior lean and pt required min assist to steady with static stand.    Ambulation/Gait Ambulation/Gait assistance: Min assist, Mod assist Gait Distance (Feet): 6 Feet Assistive device: Rolling walker (2 wheels) Gait Pattern/deviations: Step-through pattern, Decreased stride length, Decreased step length - left, Decreased step length - right, Knee flexed in stance - left, Knee flexed in stance - right, Shuffle, Trunk flexed, Narrow base of support Gait velocity: decr     General Gait Details: Min-mod assist with pt hugging therapist and HHA provided to stabilize balance and guide trun BSC>recliner. +2 assist for lines.  Stairs            Wheelchair Mobility     Tilt Bed    Modified Rankin (Stroke Patients Only)       Balance Overall balance assessment: Needs assistance Sitting-balance support: No upper extremity supported, Bilateral upper extremity supported, Single extremity supported, Feet supported Sitting balance-Leahy Scale: Poor     Standing balance support: Single extremity supported, Bilateral upper extremity supported, During functional activity Standing balance-Leahy Scale: Poor  Pertinent Vitals/Pain Pain Assessment Pain Assessment: Faces Faces Pain Scale: Hurts even more Breathing: occasional labored breathing, short period of  hyperventilation Negative Vocalization: occasional moan/groan, low speech, negative/disapproving quality Facial Expression: sad, frightened, frown Body Language: relaxed Consolability: distracted or reassured by voice/touch PAINAD Score: 4 Pain Location: HA Pain Descriptors / Indicators: Headache Pain Intervention(s): Limited activity within patient's tolerance, Monitored during session, Repositioned    Home Living Family/patient expects to be discharged to:: Private residence Living Arrangements: Alone Available Help at Discharge: Family;Available PRN/intermittently ("most of the time") Type of Home: House Home Access: Stairs to enter Entrance Stairs-Rails: None Entrance Stairs-Number of Steps: 1 to porch and then threahold   Home Layout: One level Home Equipment: Cane - single Librarian, academic (2 wheels);BSC/3in1 Additional Comments: son present in room providing history    Prior Function Prior Level of Function : Independent/Modified Independent             Mobility Comments: does not typically use AD but recently using walker ADLs Comments: does not drive, family takes her grocery shopping     Extremity/Trunk Assessment   Upper Extremity Assessment Upper Extremity Assessment: Generalized weakness;Defer to OT evaluation LUE Deficits / Details: decr initiation as opposed to R; holding in flexed position but uses functionally with time and cues.    Lower Extremity Assessment Lower Extremity Assessment: Generalized weakness    Cervical / Trunk Assessment Cervical / Trunk Assessment: Kyphotic  Communication   Communication Communication: Impaired Factors Affecting Communication: Hearing impaired (not very conversational but is able to verbalize in full sentences)    Cognition Arousal: Lethargic Behavior During Therapy: WFL for tasks assessed/performed, Impulsive   PT - Cognitive impairments: Awareness, Memory, Attention, Initiation, Sequencing, Problem  solving, Safety/Judgement                         Following commands: Impaired Following commands impaired: Follows one step commands with increased time     Cueing Cueing Techniques: Verbal cues, Gestural cues, Tactile cues     General Comments General comments (skin integrity, edema, etc.): VSS    Exercises     Assessment/Plan    PT Assessment Patient needs continued PT services  PT Problem List         PT Treatment Interventions Gait training;DME instruction;Stair training;Functional mobility training;Therapeutic activities;Therapeutic exercise;Balance training;Neuromuscular re-education;Patient/family education;Wheelchair mobility training;Cognitive remediation    PT Goals (Current goals can be found in the Care Plan section)  Acute Rehab PT Goals Patient Stated Goal: recover independence PT Goal Formulation: With patient/family Time For Goal Achievement: 07/20/23 Potential to Achieve Goals: Good    Frequency Min 1X/week     Co-evaluation               AM-PAC PT "6 Clicks" Mobility  Outcome Measure Help needed turning from your back to your side while in a flat bed without using bedrails?: A Little Help needed moving from lying on your back to sitting on the side of a flat bed without using bedrails?: A Little Help needed moving to and from a bed to a chair (including a wheelchair)?: A Little Help needed standing up from a chair using your arms (e.g., wheelchair or bedside chair)?: A Little Help needed to walk in hospital room?: A Little Help needed climbing 3-5 steps with a railing? : Total 6 Click Score: 16    End of Session Equipment Utilized During Treatment: Gait belt Activity Tolerance: Patient tolerated treatment well Patient left: in chair;with  call bell/phone within reach;with chair alarm set;with family/visitor present;with nursing/sitter in room Nurse Communication: Mobility status PT Visit Diagnosis: Muscle weakness (generalized)  (M62.81);Difficulty in walking, not elsewhere classified (R26.2);Other symptoms and signs involving the nervous system (R29.898);Other abnormalities of gait and mobility (R26.89)    Time: 3086-5784 PT Time Calculation (min) (ACUTE ONLY): 14 min   Charges:   PT Evaluation $PT Eval Moderate Complexity: 1 Mod   PT General Charges $$ ACUTE PT VISIT: 1 Visit         Wynn Maudlin, DPT Acute Rehabilitation Services Office [phone]  07/06/23 11:43 AM

## 2023-07-06 NOTE — Progress Notes (Signed)
Inpatient Rehab Admissions Coordinator:  ° °Per therapy recommendation,  patient was screened for CIR candidacy by Ltanya Bayley, MS, CCC-SLP. At this time, Pt. Appears to be a a potential candidate for CIR. I will place   order for rehab consult per protocol for full assessment. Please contact me any with questions. ° °Jacy Brocker, MS, CCC-SLP °Rehab Admissions Coordinator  °[phone] (celll) °[phone] (office) ° °

## 2023-07-06 NOTE — Progress Notes (Signed)
 Inpatient Rehab Admissions Coordinator:    I met with pt. And her son regarding potential CIR admit. They are interested in CIR and [name] states that he and his other 3 siblings will rotate to provide Pt.'s care but that he will be the primary caregiver. I will follow and pursue potential CIR once medically stable.   Megan Salon, MS, CCC-SLP Rehab Admissions Coordinator  [phone] (celll) [phone] (office)

## 2023-07-06 NOTE — Progress Notes (Signed)
    Providing Compassionate, Quality Care - Together   NEUROSURGERY PROGRESS NOTE     S: No issues overnight.    O: EXAM:  BP (!) 122/50   Pulse (!) 49   Temp 98.7 F (37.1 C) (Axillary)   Resp 10   Ht 4\' 11"  (1.499 m)   Wt 36.6 kg   SpO2 98%   BMI 16.30 kg/m     Awake, alert, oriented  Speech fluent, appropriate  CNs grossly intact  Strength/sensation grossly intact BUE/BLE    ASSESSMENT:  89 y.o. with hx of CVA on plavix, Sjogren's, SLE, MGN with an acute R SDH and scattered tSAH/contusions, stable on CT     PLAN: -Appropriate for dc from NSGY standpoint with f/u CTH in 2 weeks and outpt f/u. Will arrange w/ our office.  -Continue to hold plavix -Complete 7d course of keppra -Call w/ questions/concerns.   Patrici Ranks, [facility]

## 2023-07-06 NOTE — Progress Notes (Addendum)
 NAME:  Peggy Ramirez, MRN:  [id number], DOB:  May 14, 1933, LOS: 2 ADMISSION DATE:  07/04/2023,  CHIEF COMPLAINT: Traumatic subdural hematoma/subarachnoid hemorrhage  History of Present Illness:  89 year old woman with a history of Sjogren's, hypertension, SLE, membranous glomerulonephritis (not on immunosuppression), history CVA on Plavix. She experienced a mechanical fall on 3/7 without loss of consciousness.  She was evaluated in the ED at [facility] and trauma workup performed.  CT scan of her head showed posttraumatic intracranial hemorrhage 9 to 10 mm right SDH, scattered bilateral SAH and a posttrauma pattern.  There was no midline shift, no evidence of scalp fracture.  Cervical spine CT did not show any traumatic fracture.  There is chronic cervical spine degeneration C4-C6.  Pertinent  Medical History   Past Medical History:  Diagnosis Date   Acute metabolic encephalopathy    Chronic constipation    Chronic left lower quadrant pain    GERD (gastroesophageal reflux disease)    Headache(784.0)    High cholesterol    Hydrosalpinx    LEFT   Hypertension    Lupus    Membranous glomerulonephritis    followed by Dr. Clelia Schaumann   Ovarian fibroma    RIGHT   Pelvic adhesions    Sjogren's syndrome (HCC)    Stroke (HCC)      Significant Hospital Events: Including procedures, antibiotic start and stop dates in addition to other pertinent events   3/8 admit after fall.  Head CT 3/8 >> acute posttraumatic intracranial hemorrhage with a right sided subdural hematoma that is lobulated with a maximal thickness of 9-10 mm, generally 4 mm.  Scattered bilateral SAH in a posttraumatic pattern.  No evidence of midline shift or intracranial mass effect.  No skull fracture. 3/9 CT head significant interval increase in right subdural hematoma with increase mass effect. No midline shift.  Neurosurgery consulted, DDAVP given.  Repeat CT head with stable right subdural hematoma and scattered SAH with  similar mass effect.  Hypotensive overnight given 500 cc fluid bolus   Interim History / Subjective:   No acute events  Objective   Blood pressure (!) 122/50, pulse (!) 49, temperature 99.4 F (37.4 C), temperature source Axillary, resp. rate 10, height 4\' 11"  (1.499 m), weight 36.6 kg, SpO2 98%.        Intake/Output Summary (Last 24 hours) at 07/06/2023 0829 Last data filed at 07/06/2023 0300 Gross per 24 hour  Intake --  Output 200 ml  Net -200 ml   Filed Weights   07/04/23 1220 07/05/23 0500 07/06/23 0500  Weight: 36.6 kg 36.7 kg 36.6 kg   Physical Exam: Blood pressure (!) 122/50, pulse (!) 49, temperature 99.4 F (37.4 C), temperature source Axillary, resp. rate 10, height 4\' 11"  (1.499 m), weight 36.6 kg, SpO2 98%. Gen:      No acute distress, frail, elderly HEENT:  EOMI, sclera anicteric Neck:     No masses; no thyromegaly Lungs:    Clear to auscultation bilaterally; normal respiratory effort CV:         Regular rate and rhythm; no murmurs Abd:      + bowel sounds; soft, non-tender; no palpable masses, no distension Ext:    No edema; adequate peripheral perfusion Skin:      Warm and dry; no rash Neuro: Somnolent arousable  Lab/imaging reviewed Significant for potassium 3.3 Hemoglobin 11.2 No new imaging  Resolved Hospital Problem list     Assessment & Plan:   Acute posttraumatic subdural hematoma and subarachnoid  hemorrhage without midline shift or significant neurological changes on initial CT done at 9:25 AM. Repeat CT head at 14:19 in increase in right subdural hematoma with mass effect. NSGY gave DDAVP. F/u CT head 20:13 stable. -NSGY following. Imaging per consult team or if neuro status worsens -Hold Plavix and anticoagulation -ICU monitoring -Neuroprotective measures and seizure precautions -SBP goal < 160 -Pain control  Scalp laceration -Stapled in the ED -Pain control  Hypertension -Hold home amlodipine -Hydralazine  available  Hyperlipidemia -Continue simvastatin  History of anxiety -Uses Xanax as needed at home.  Will be careful with sedating medications because we do not want to cloud neurological exam  GERD -Continue PPI  History cataracts, chronic dry eyes -Patient uses eye moistening gel 4 times daily  Hyponatremia Encourage p.o. intake, salt tabs with diet.  PT/OT to work with patient, Stable for transfer out of ICU  Best Practice (right click and "Reselect all SmartList Selections" daily)   Diet/type: Regular consistency (see orders) DVT prophylaxis SCD Pressure ulcer(s): N/A GI prophylaxis: PPI Lines: N/A Foley:  N/A Code Status:  full code Last date of multidisciplinary goals of care discussion [discussed w family 3/8]  Critical care time: NA   Chilton Greathouse MD [facility] See Amion for pager  If no response to pager , please call [phone] until 7pm After 7:00 pm call Elink  [phone] 07/06/2023, 9:12 AM

## 2023-07-06 NOTE — PMR Pre-admission (Shared)
 PMR Admission Coordinator Pre-Admission Assessment  Patient: Peggy Ramirez is an 89 y.o., female MRN: [id number] DOB: 04/20/1934 Height: 4\' 11"  (149.9 cm) Weight: 36.6 kg  Insurance Information HMO:    PPO:      PCP:      IPA:      80/20:      OTHER:  PRIMARY: UHC Medicare      Policy#: [id number], Medicare: [id number]    Subscriber: Pt CM Name: n/a      Phone#: [phone]     Fax#: [phone] Pre-Cert#: [id number]    Jamie with [facility] called with approval 3/11 for admit 3/11-3/18   Employer:  Benefits:  Phone #:      Name:  Eff Date: 04/29/2023 - 04/27/2024 Deductible: does not have one OOP Max: $3,900 ($85 met) CIR: $295/day co-pay for days 1-5, $0/day co-pay for days 6+  SNF: $0.00 Copayment per day for days 1-20; $203.00 Copayment per day for days 21-100 for Medicare-covered care/maximum 100 days/benefit period  Outpatient: $20/visit co-pay  Home Health:  100% coverage; limited by medical necessity  DME: 80% coverage; 20% co-insurance  Providers: in network   SECONDARY:       Policy#:      Phone#:   The "Data Collection Information Summary" for patients in Inpatient Rehabilitation Facilities with attached "Privacy Act Statement-Health Care Records" was provided and verbally reviewed with: Patient  Emergency Contact Information Contact Information     Name Relation Home Work Mobile   [place] Son   [phone]   [facility] Daughter [phone]     [place], [place] [phone]        Other Contacts   None on File     Current Medical History  Patient Admitting Diagnosis: Fall with R SDH History of Present Illness: Peggy Ramirez is a 89 year old right-handed female with history significant for Sjorgens syndrome, SLE, hypertension, 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 Clelia Schaumann, history of CVA on Plavix,Anxiety.  Per chart review patient lives alone.  1 level home one-step to entry.  Reportedly independent prior to  admission but recently started using a walker.  She does not drive.  Family takes her grocery shopping.  Presented 07/04/2023 after reported fall without loss of consciousness.  She was initially seen at [facility] and a CT of the head demonstrated a right sided subdural hemorrhage measuring 9-10 mm without midline shift.  Patient did sustain a scalp laceration stapled in the ED.  CT cervical spine did not demonstrate any traumatic injury.  Admission chemistries were unremarkable.  She was transferred to [facility] and receive neurosurgical follow-up consultation Dr. Hoyt Koch with conservative care.  Placed on Keppra x 7 days for seizure prophylaxis.  Latest follow-up CT imaging showed stable right subdural hematoma.  She has had bouts of hypotension early on in her hospital stay requiring fluid boluses.  Hyponatremia 127-130 received DDAVP.  Her Plavix for history of CVA remains on hold due to SDH.  Therapy evaluations completed due to patient decreased functional mobility was admitted for a comprehensive rehab program.     Patient's medical record from [facility] has been reviewed by the rehabilitation admission coordinator and physician.  Past Medical History  Past Medical History:  Diagnosis Date   Acute metabolic encephalopathy    Chronic constipation    Chronic left lower quadrant pain    GERD (gastroesophageal reflux disease)    Headache(784.0)    High cholesterol    Hydrosalpinx  LEFT   Hypertension    Lupus    Membranous glomerulonephritis    followed by Dr. Clelia Schaumann   Ovarian fibroma    RIGHT   Pelvic adhesions    Sjogren's syndrome (HCC)    Stroke [facility])     Has the patient had major surgery during 100 days prior to admission? No  Family History   family history includes Anxiety disorder in an other family member; Depression in an other family member; Heart disease in an other family member.  Current Medications  Current  Facility-Administered Medications:    acetaminophen (TYLENOL) tablet 650 mg, 650 mg, Oral, Q4H PRN, Leslye Peer, MD, 650 mg at 07/06/23 1403   ALPRAZolam Prudy Feeler) tablet 0.25 mg, 0.25 mg, Oral, Daily PRN, Bedelia Person, MD, 0.25 mg at 07/05/23 1756   ascorbic acid (VITAMIN C) tablet 1,000 mg, 1,000 mg, Oral, Daily, Leslye Peer, MD, 1,000 mg at 07/06/23 1011   Chlorhexidine Gluconate Cloth 2 % PADS 6 each, 6 each, Topical, Daily, Leslye Peer, MD, 6 each at 07/06/23 0820   docusate sodium (COLACE) capsule 100 mg, 100 mg, Oral, Daily, Leslye Peer, MD, 100 mg at 07/06/23 1011   hydrALAZINE (APRESOLINE) injection 10 mg, 10 mg, Intravenous, Q6H PRN, Leslye Peer, MD   levETIRAcetam (KEPPRA) tablet 500 mg, 500 mg, Oral, BID, Luciano Cutter, MD, 500 mg at 07/06/23 1011   loratadine (CLARITIN) tablet 10 mg, 10 mg, Oral, Daily PRN, Leslye Peer, MD   morphine (PF) 2 MG/ML injection 2-4 mg, 2-4 mg, Intravenous, Q4H PRN, Leslye Peer, MD   multivitamin with minerals tablet 1 tablet, 1 tablet, Oral, Daily, Leslye Peer, MD, 1 tablet at 07/06/23 1011   ondansetron (ZOFRAN) injection 4 mg, 4 mg, Intravenous, Q6H PRN, Leslye Peer, MD   Oral care mouth rinse, 15 mL, Mouth Rinse, PRN, Byrum, Les Pou, MD   pantoprazole (PROTONIX) EC tablet 40 mg, 40 mg, Oral, Daily, Byrum, Les Pou, MD, 40 mg at 07/06/23 1011   polyethylene glycol powder (GLYCOLAX/MIRALAX) container 17 g, 17 g, Oral, Daily PRN, Byrum, Les Pou, MD   polyvinyl alcohol (LIQUIFILM TEARS) 1.4 % ophthalmic solution 2 drop, 2 drop, Both Eyes, PRN, Byrum, Les Pou, MD   simvastatin (ZOCOR) tablet 20 mg, 20 mg, Oral, Daily, Byrum, Les Pou, MD, 20 mg at 07/06/23 1011   traZODone (DESYREL) tablet 75 mg, 75 mg, Oral, QHS, Leslye Peer, MD, 75 mg at 07/05/23 2129  Patients Current Diet:  Diet Order             Diet Heart Room service appropriate? Yes with Assist; Fluid consistency: Thin  Diet effective now                    Precautions / Restrictions Precautions Precautions: None Restrictions Weight Bearing Restrictions Per Provider Order: No   Has the patient had 2 or more falls or a fall with injury in the past year? Yes  Prior Activity Level Community (5-7x/wk): Pt. is active in the community  Prior Functional Level Self Care: Did the patient need help bathing, dressing, using the toilet or eating? Independent  Indoor Mobility: Did the patient need assistance with walking from room to room (with or without device)? Independent  Stairs: Did the patient need assistance with internal or external stairs (with or without device)? Independent  Functional Cognition: Did the patient need help planning regular tasks such as shopping or remembering to take  medications? Needed some help  Patient Information Are you of Hispanic, Latino/a,or Spanish origin?: A. No, not of Hispanic, Latino/a, or Spanish origin What is your race?: A. White Do you need or want an interpreter to communicate with a doctor or health care staff?: 1. Yes  Patient's Response To:  Health Literacy and Transportation Is the patient able to respond to health literacy and transportation needs?: Yes Health Literacy - How often do you need to have someone help you when you read instructions, pamphlets, or other written material from your doctor or pharmacy?: Sometimes In the past 12 months, has lack of transportation kept you from medical appointments or from getting medications?: No In the past 12 months, has lack of transportation kept you from meetings, work, or from getting things needed for daily living?: No  Journalist, newspaper / Equipment Home Equipment: Cane - single point, Agricultural consultant (2 wheels), BSC/3in1  Prior Device Use: Indicate devices/aids used by the patient prior to current illness, exacerbation or injury? Walker  Current Functional Level Cognition  Orientation Level: Oriented to person, Oriented  to place, Disoriented to time, Disoriented to situation    Extremity Assessment (includes Sensation/Coordination)  Upper Extremity Assessment: Generalized weakness, Defer to OT evaluation LUE Deficits / Details: decr initiation as opposed to R; holding in flexed position but uses functionally with time and cues.  Lower Extremity Assessment: Generalized weakness    ADLs  Overall ADL's : Needs assistance/impaired Grooming: Oral care, Moderate assistance, Bed level Grooming Details (indicate cue type and reason): mod cues for sequencing, opening eyes to be able to place dentures back in mouth Upper Body Bathing: Minimal assistance, Sitting Lower Body Bathing: Maximal assistance, Sit to/from stand Upper Body Dressing : Minimal assistance, Sitting Lower Body Dressing: Maximal assistance, Sit to/from stand Toilet Transfer: Minimal assistance, +2 for physical assistance, +2 for safety/equipment, Stand-pivot Functional mobility during ADLs: Minimal assistance    Mobility  Overal bed mobility: Needs Assistance Bed Mobility: Supine to Sit Supine to sit: Min assist General bed mobility comments: seated EOB with OT at start    Transfers  Overall transfer level: Needs assistance Equipment used: 1 person hand held assist, 2 person hand held assist Transfers: Sit to/from Stand, Bed to chair/wheelchair/BSC Sit to Stand: Min assist Bed to/from chair/wheelchair/BSC transfer type:: Stand pivot Stand pivot transfers: Min assist General transfer comment: min assist for power up with pt holding/hugginh therapist for support. slight posterior lean and pt required min assist to steady with static stand.    Ambulation / Gait / Stairs / Wheelchair Mobility  Ambulation/Gait Ambulation/Gait assistance: Min assist, Mod assist Gait Distance (Feet): 6 Feet Assistive device: Rolling walker (2 wheels) Gait Pattern/deviations: Step-through pattern, Decreased stride length, Decreased step length - left,  Decreased step length - right, Knee flexed in stance - left, Knee flexed in stance - right, Shuffle, Trunk flexed, Narrow base of support General Gait Details: Min-mod assist with pt hugging therapist and HHA provided to stabilize balance and guide trun BSC>recliner. +2 assist for lines. Gait velocity: decr    Posture / Balance Balance Overall balance assessment: Needs assistance Sitting-balance support: No upper extremity supported, Bilateral upper extremity supported, Single extremity supported, Feet supported Sitting balance-Leahy Scale: Poor Standing balance support: Single extremity supported, Bilateral upper extremity supported, During functional activity Standing balance-Leahy Scale: Poor    Special needs/care consideration Skin: stitches on scalp and Special service needs TBI therapies    Previous Home Environment (from acute therapy documentation) Living Arrangements: Alone Available Help  at Discharge: Family, Available PRN/intermittently ("most of the time") Type of Home: House Home Layout: One level Home Access: Stairs to enter Entrance Stairs-Rails: None Entrance Stairs-Number of Steps: 1 to porch and then threahold [employer] Shower/Tub: Health visitor: Handicapped height Home Care Services: No Additional Comments: son present in room providing history  Discharge Living Setting Plans for Discharge Living Setting: Patient's home Type of Home at Discharge: House Discharge Home Layout: One level Discharge Home Access: Stairs to enter Entrance Stairs-Rails: None Entrance Stairs-Number of Steps: 1+1 Discharge Bathroom Shower/Tub: Walk-in shower Discharge Bathroom Toilet: Handicapped height Discharge Bathroom Accessibility: Yes How Accessible: Accessible via walker Does the patient have any problems obtaining your medications?: No  Social/Family/Support Systems Patient Roles: Other (Comment) Contact Information: Son Juneau Doughman Anticipated Caregiver:  [phone] Anticipated Caregiver's Contact Information: [name] will be with Pt. most of the week, other children to cover weekends and other days as needed Ability/Limitations of Caregiver: Min A Caregiver Availability: 24/7 Discharge Plan Discussed with Primary Caregiver: Yes Is Caregiver In Agreement with Plan?: Yes Does Caregiver/Family have Issues with Lodging/Transportation while Pt is in Rehab?: No  Goals Patient/Family Goal for Rehab: PT/OT/SLP Supervision Expected length of stay: 7-10 days Pt/Family Agrees to Admission and willing to participate: Yes Program Orientation Provided & Reviewed with Pt/Caregiver Including Roles  & Responsibilities: Yes  Decrease burden of Care through IP rehab admission: not anticipated  Possible need for SNF placement upon discharge: not anticipated  Patient Condition: This patient's condition remains as documented in the consult dated 07/07/23, in which the Rehabilitation Physician determined and documented that the patient's condition is appropriate for intensive rehabilitative care in an inpatient rehabilitation facility. Will admit to inpatient rehab today.  Preadmission Screen Completed By:  Jeronimo Greaves, CCC-SLP, 07/06/2023 2:25 PM ______________________________________________________________________   Discussed status with Dr. Carlis Abbott on 07/08/23 at 900 and received approval for admission today.  Admission Coordinator:  Jeronimo Greaves, time 900/Date 07/08/23

## 2023-07-07 DIAGNOSIS — S066X0D Traumatic subarachnoid hemorrhage without loss of consciousness, subsequent encounter: Secondary | ICD-10-CM | POA: Diagnosis not present

## 2023-07-07 DIAGNOSIS — R451 Restlessness and agitation: Secondary | ICD-10-CM | POA: Diagnosis not present

## 2023-07-07 LAB — BASIC METABOLIC PANEL
Anion gap: 3 — ABNORMAL LOW (ref 5–15)
BUN: 8 mg/dL (ref 8–23)
CO2: 25 mmol/L (ref 22–32)
Calcium: 9.3 mg/dL (ref 8.9–10.3)
Chloride: 99 mmol/L (ref 98–111)
Creatinine, Ser: 0.63 mg/dL (ref 0.44–1.00)
GFR, Estimated: >60 mL/min (ref >60)
Glucose, Bld: 101 mg/dL — ABNORMAL HIGH (ref 70–99)
Potassium: 4 mmol/L (ref 3.5–5.1)
Sodium: 127 mmol/L — ABNORMAL LOW (ref 135–145)

## 2023-07-07 LAB — CBC
HCT: 32.8 % — ABNORMAL LOW (ref 36.0–46.0)
Hemoglobin: 11.4 g/dL — ABNORMAL LOW (ref 12.0–15.0)
MCH: 31.9 pg (ref 26.0–34.0)
MCHC: 34.8 g/dL (ref 30.0–36.0)
MCV: 91.9 fL (ref 80.0–100.0)
Platelets: 183 10*3/uL (ref 150–400)
RBC: 3.57 MIL/uL — ABNORMAL LOW (ref 3.87–5.11)
RDW: 11.9 % (ref 11.5–15.5)
WBC: 8.8 10*3/uL (ref 4.0–10.5)
nRBC: 0 % (ref 0.0–0.2)

## 2023-07-07 MED ORDER — QUETIAPINE FUMARATE 25 MG PO TABS: 12.5000 mg | ORAL_TABLET | Freq: Every evening | ORAL | Status: DC | PRN

## 2023-07-07 NOTE — Progress Notes (Signed)
 PROGRESS NOTE    Peggy Ramirez  ZOX:[id number] DOB: 1933/05/03 DOA: 07/04/2023 PCP: Estanislado Pandy, MD  Chief Complaint  Patient presents with   [facility] Course:  Peggy Ramirez is 89 y.o. 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  Patient had a mechanical fall on 3/7 and was evaluated in the ED at [facility] for trauma workup.  Head CT revealed posttraumatic intracranial hemorrhage, scattered bilateral SAH and posttrauma pattern.  There is no midline shift, no evidence of scalp fracture.  Cervical spine CT did not show any traumatic fracture though did reveal a chronic cervical spine degeneration from C4-C6.  Patient was transferred to [facility] and managed in the ICU.  Repeat head CT revealed interval increase in right subdural hematoma with increased mass effect.  Neurosurgery was consulted.  Patient received DDAVP.  She was hypotensive during ICU stay requiring fluid boluses.  Head CT stabilized and neurosurgery cleared patient for discharge to follow-up in 2 weeks in clinic.  Continue to hold Plavix.  Completed 7-day course of Keppra.  On 3/10 patient was then transferred to [facility].  Subjective: Patient's daughter is at bedside and reports that the patient was very agitated and then received Ativan.  She has been very drowsy since that time.  On my evaluation the patient does not participate.  She opens her eyes weakly and goes back to sleep   Objective: Vitals:   07/06/23 1400 07/06/23 1922 07/07/23 0045 07/07/23 0817  BP: 129/60 (!) 148/63 (!) 148/64 139/76  Pulse: 74 71 66 90  Resp: 20 16 18 18   Temp:  97.8 F (36.6 C) 98.4 F (36.9 C) 97.9 F (36.6 C)  TempSrc:  Oral Oral Oral  SpO2: 98% 100% 100% 98%  Weight:      Height:       No intake or output data in the 24 hours ending 07/07/23 1045 Filed Weights   07/04/23 1220  07/05/23 0500 07/06/23 0500  Weight: 36.6 kg 36.7 kg 36.6 kg    Examination: General exam: Appears calm and comfortable, NAD  Respiratory system: No work of breathing, symmetric chest wall expansion Cardiovascular system: S1 & S2 heard, RRR.  Gastrointestinal system: Abdomen is nondistended, soft and nontender.  Neuro: Cannot assess.  Patient is very drowsy, sleeping, does not follow commands or participate in evaluation today. Skin: No rashes, lesions Psychiatry: Not assessed  Assessment & Plan:  Principal Problem:   Traumatic subarachnoid hemorrhage (HCC) Active Problems:   SDH (subdural hematoma) (HCC)   Acute posttraumatic subdural hematoma and subarachnoid hemorrhage - no midline shift or significant neurologic changes on initial head CT, repeat head CT did show increase in right subdural hematoma with mass effect - Initially managed in ICU - Status post DDAVP - Neurosurgery consulted, has recommended discharge now, outpatient follow-up in 2 weeks - Complete 7-day Keppra prophylaxis - Hold Plavix and other anticoagulation - SBP goal under 160 - Continue seizure precautions -- Pt/OT recommending inpatient rehab.  TOC consulted  Prior CVA - Hold Plavix in setting of brain bleed as above. - Continue statin  Scalp laceration - Stapled in ED - Follow-up outpatient for staple removal  Hypertension - SBP goal under 160 - Titrate home meds to meet this goal  Hyperlipidemia - Continue statin  Anxiety - Uses as needed Xanax at home.  Proceed with caution with antianxiety medications and not further complicate  neurologic exam. - For agitation we will proceed with Seroquel 2.5 mg  GERD - Continue PPI  History of cataracts - Chronic dry eyes - Resume home drops  Hyponatremia - Presumed hypovolemia due to poor p.o. intake - Currently on salt tabs, will continue. Titrate  DVT prophylaxis: SCDs only for now   Code Status: Full Code Family Communication:  Discussed  directly with patient's daughter at bedside Disposition:  Inpatient.  Medically cleared pending arrangement for inpatient rehab.  TOC working on it.    Consultants:  Treatment Team:  Consulting Physician: Bedelia Person, MD PCCM Neurosurgery   Procedures:    Antimicrobials:  Anti-infectives (From admission, onward)    None       Data Reviewed: I have personally reviewed following labs and imaging studies CBC: Recent Labs  Lab 07/04/23 0951 07/05/23 0928 07/07/23 0455  WBC 3.9* 5.8 8.8  NEUTROABS 3.0  --   --   HGB 15.5* 11.2* 11.4*  HCT 46.0 33.1* 32.8*  MCV 94.7 94.0 91.9  PLT 234 183 183   Basic Metabolic Panel: Recent Labs  Lab 07/04/23 0951 07/05/23 0928 07/06/23 1130 07/07/23 0455  NA 137 135 128* 127*  K 4.4 3.3* 4.1 4.0  CL 98 102 97* 99  CO2 30 27 23 25   GLUCOSE 94 145* 102* 101*  BUN 14 18 11 8   CREATININE 0.72 0.81 0.59 0.63  CALCIUM 11.1* 9.8 10.3 9.3  MG  --  2.2  --   --    GFR: Estimated Creatinine Clearance: 28.1 mL/min (by C-G formula based on SCr of 0.63 mg/dL). Liver Function Tests: No results for input(s): "AST", "ALT", "ALKPHOS", "BILITOT", "PROT", "ALBUMIN" in the last 168 hours. CBG: No results for input(s): "GLUCAP" in the last 168 hours.  Recent Results (from the past 240 hours)  MRSA Next Gen by PCR, Nasal     Status: None   Collection Time: 07/04/23 12:23 PM   Specimen: Nasal Mucosa; Nasal Swab  Result Value Ref Range Status   MRSA by PCR Next Gen NOT DETECTED NOT DETECTED Final    Comment: (NOTE) The GeneXpert MRSA Assay (FDA approved for NASAL specimens only), is one component of a comprehensive MRSA colonization surveillance program. It is not intended to diagnose MRSA infection nor to guide or monitor treatment for MRSA infections. Test performance is not FDA approved in patients less than 28 years old. Performed at [facility], [address], [place], [place] [address]      Radiology Studies: No  results found.  Scheduled Meds:  vitamin C  1,000 mg Oral Daily   Chlorhexidine Gluconate Cloth  6 each Topical Daily   docusate sodium  100 mg Oral Daily   levETIRAcetam  500 mg Oral BID   multivitamin with minerals  1 tablet Oral Daily   pantoprazole  40 mg Oral Daily   simvastatin  20 mg Oral Daily   sodium chloride  1 g Oral TID WC   traZODone  75 mg Oral QHS   Continuous Infusions:   LOS: 3 days      Debarah Crape, DO [facility]  To contact the attending physician between 7A-7P please use Epic Chat. To contact the covering physician during after hours 7P-7A, please review Amion.   07/07/2023, 10:45 AM   *This document has been created with the assistance of dictation software. Please excuse typographical errors. *

## 2023-07-07 NOTE — Consult Note (Signed)
 Physical Medicine and Rehabilitation Consult Reason for Consult:right SDH after fall Referring Physician: Rennis Chris   HPI: Peggy Ramirez is a 89 y.o. female with history of Sjogren's syndrome, hypertension, SLE, membranous glomerular nephritis, prior CVA who fell on 07/03/2023 without apparent loss of consciousness.  She was seen at [facility] initially and CT of the head demonstrated a right sided subdural hemorrhage measuring 9 to 10 mm without midline shift.  CT of the cervical spine did not demonstrate any traumatic injury.  Patient was seen by neurosurgery who recommended conservative management.  Patient was placed on 7-day course of Keppra and Plavix was held.  Follow-up CT of the head recommended in 2 weeks for follow-up.  Patient was evaluated by therapy yesterday and was min assist for sit to stand transfers min to mod assist for ambulation 6 feet using a rolling walker.  Prior to admission patient was modified independent using a walker.  She does not drive.  She lives alone in a 1 level house with one-step to enter.  Family is available intermittently.   Review of Systems  Unable to perform ROS: Mental acuity   Past Medical History:  Diagnosis Date   Acute metabolic encephalopathy    Chronic constipation    Chronic left lower quadrant pain    GERD (gastroesophageal reflux disease)    Headache(784.0)    High cholesterol    Hydrosalpinx    LEFT   Hypertension    Lupus    Membranous glomerulonephritis    followed by Dr. Clelia Schaumann   Ovarian fibroma    RIGHT   Pelvic adhesions    Sjogren's syndrome (HCC)    Stroke [facility])    Past Surgical History:  Procedure Laterality Date   APPENDECTOMY  04/28/2000   BIOPSY  12/17/2020   Procedure: BIOPSY;  Surgeon: Lanelle Bal, DO;  Location: [facility] ENDO SUITE;  Service: Endoscopy;;   COLONOSCOPY WITH PROPOFOL N/A 12/17/2020   Procedure: COLONOSCOPY WITH PROPOFOL;  Surgeon: Lanelle Bal, DO;  Location: [facility]  SU[facility];  Service: Endoscopy;  Laterality: N/A;   FRACTURE SURGERY     HIP PINNING,CANNULATED Left 12/15/2019   Procedure: CANNULATED HIP PINNING;  Surgeon: Roby Lofts, MD;  Location: [facility] OR;  Service: Orthopedics;  Laterality: Left;   LAPAROSCOPIC CHOLECYSTECTOMY  04/28/2000   TONSILLECTOMY  04/29/1939   TOTAL ABDOMINAL HYSTERECTOMY W/ BILATERAL SALPINGOOPHORECTOMY  04/29/2003   VAGINAL HYSTERECTOMY  04/29/1979   Family History  Problem Relation Age of Onset   Heart disease Other    Anxiety disorder Other    Depression Other    Social History:  reports that she has never smoked. She has never used smokeless tobacco. She reports that she does not drink alcohol and does not use drugs. Allergies:  Allergies  Allergen Reactions   Atorvastatin Other (See Comments)    Muscle aches   Cellcept [Mycophenolate Mofetil] Other (See Comments)    unknown   Medrol [Methylprednisolone] Other (See Comments)    unknown   Mycophenolate Mofetil Other (See Comments)    unknown   Penicillins Itching   Sulfonamide Derivatives Nausea Only   Topiramate Other (See Comments)    unknown   Verapamil Other (See Comments)    Felt shakey   Levofloxacin Rash   Medications Prior to Admission  Medication Sig Dispense Refill   ALPRAZolam (XANAX) 0.25 MG tablet Take 0.25 mg by mouth at bedtime as needed for anxiety.     amLODipine (NORVASC)  2.5 MG tablet Take 1 tablet (2.5 mg total) by mouth daily. 90 tablet 1   Ascorbic Acid (VITAMIN C) 1000 MG tablet Take 1,000 mg by mouth daily.     Carboxymethylcellulose Sodium (REFRESH LIQUIGEL OP) Place 1 drop into both eyes in the morning, at noon, in the evening, and at bedtime. Gel drops     clopidogrel (PLAVIX) 75 MG tablet Take 1 tablet (75 mg total) by mouth daily. 30 tablet 1   docusate sodium (COLACE) 100 MG capsule Take 100 mg by mouth daily as needed for moderate constipation.     loratadine (CLARITIN) 10 MG tablet Take 10 mg by mouth daily as needed for  allergies.     meloxicam (MOBIC) 7.5 MG tablet Take 7.5 mg by mouth daily.     Multiple Vitamin (MULTIVITAMIN) tablet Take 1 tablet by mouth daily.     pantoprazole (PROTONIX) 40 MG tablet Take 40 mg by mouth daily.     polyethylene glycol powder (GLYCOLAX/MIRALAX) 17 GM/SCOOP powder Take 17 g by mouth daily as needed for mild constipation. 255 g 0   simvastatin (ZOCOR) 20 MG tablet Take 20 mg by mouth daily.     traZODone (DESYREL) 50 MG tablet Take 75 mg by mouth at bedtime.     Misc. Devices (3-IN-1 BEDSIDE TOILET) MISC Patient needs 3 in 1 bedside toilet. Recent hip fracture with repair 1 each 1    Home: Home Living Family/patient expects to be discharged to:: Inpatient rehab Living Arrangements: Alone Available Help at Discharge: Family, Available PRN/intermittently ("most of the time") Type of Home: House Home Access: Stairs to enter [employer] of Steps: 1 to porch and then threahold Entrance Stairs-Rails: None Home Layout: One level Bathroom Shower/Tub: Health visitor: Handicapped height Home Equipment: Cane - single point, Agricultural consultant (2 wheels), BSC/3in1 Additional Comments: son present in room providing history  Functional History: Prior Function Prior Level of Function : Independent/Modified Independent Mobility Comments: does not typically use AD but recently using walker ADLs Comments: does not drive, family takes her grocery shopping Functional Status:  Mobility: Bed Mobility Overal bed mobility: Needs Assistance Bed Mobility: Supine to Sit Supine to sit: Min assist General bed mobility comments: seated EOB with OT at start Transfers Overall transfer level: Needs assistance Equipment used: 1 person hand held assist, 2 person hand held assist Transfers: Sit to/from Stand, Bed to chair/wheelchair/BSC Sit to Stand: Min assist Bed to/from chair/wheelchair/BSC transfer type:: Stand pivot Stand pivot transfers: Min assist General  transfer comment: min assist for power up with pt holding/hugginh therapist for support. slight posterior lean and pt required min assist to steady with static stand. Ambulation/Gait Ambulation/Gait assistance: Min assist, Mod assist Gait Distance (Feet): 6 Feet Assistive device: Rolling walker (2 wheels) Gait Pattern/deviations: Step-through pattern, Decreased stride length, Decreased step length - left, Decreased step length - right, Knee flexed in stance - left, Knee flexed in stance - right, Shuffle, Trunk flexed, Narrow base of support General Gait Details: Min-mod assist with pt hugging therapist and HHA provided to stabilize balance and guide trun BSC>recliner. +2 assist for lines. Gait velocity: decr    ADL: ADL Overall ADL's : Needs assistance/impaired Grooming: Oral care, Moderate assistance, Bed level Grooming Details (indicate cue type and reason): mod cues for sequencing, opening eyes to be able to place dentures back in mouth Upper Body Bathing: Minimal assistance, Sitting Lower Body Bathing: Maximal assistance, Sit to/from stand Upper Body Dressing : Minimal assistance, Sitting Lower Body Dressing: Maximal  assistance, Sit to/from stand Toilet Transfer: Minimal assistance, +2 for physical assistance, +2 for safety/equipment, Stand-pivot Functional mobility during ADLs: Minimal assistance  Cognition: Cognition Orientation Level: Oriented to person, Oriented to place, Oriented to time, Oriented to situation Cognition Arousal: Lethargic Behavior During Therapy: [facility] for tasks assessed/performed, Impulsive  Blood pressure 139/76, pulse 90, temperature 97.9 F (36.6 C), temperature source Oral, resp. rate 18, height 4\' 11"  (1.499 m), weight 36.6 kg, SpO2 98%. Physical Exam Constitutional:      Comments: Sedated from recent xanax dose  HENT:     Nose: Nose normal.  Cardiovascular:     Rate and Rhythm: Normal rate.  Pulmonary:     Effort: Pulmonary effort is normal.   Abdominal:     Palpations: Abdomen is soft.  Neurological:     Comments: Pt lethargic and unable to participate in exam when I was in the room.   Psychiatric:     Comments: sedated     Results for orders placed or performed during the hospital encounter of 07/04/23 (from the past 24 hours)  Basic metabolic panel     Status: Abnormal   Collection Time: 07/06/23 11:30 AM  Result Value Ref Range   Sodium 128 (L) 135 - 145 mmol/L   Potassium 4.1 3.5 - 5.1 mmol/L   Chloride 97 (L) 98 - 111 mmol/L   CO2 23 22 - 32 mmol/L   Glucose, Bld 102 (H) 70 - 99 mg/dL   BUN 11 8 - 23 mg/dL   Creatinine, Ser 1.61 0.44 - 1.00 mg/dL   Calcium 09.6 8.9 - 04.5 mg/dL   GFR, Estimated >40 >98 mL/min   Anion gap 8 5 - 15  CBC     Status: Abnormal   Collection Time: 07/07/23  4:55 AM  Result Value Ref Range   WBC 8.8 4.0 - 10.5 K/uL   RBC 3.57 (L) 3.87 - 5.11 MIL/uL   Hemoglobin 11.4 (L) 12.0 - 15.0 g/dL   HCT 11.9 (L) 14.7 - 82.9 %   MCV 91.9 80.0 - 100.0 fL   MCH 31.9 26.0 - 34.0 pg   MCHC 34.8 30.0 - 36.0 g/dL   RDW 56.2 13.0 - 86.5 %   Platelets 183 150 - 400 K/uL   nRBC 0.0 0.0 - 0.2 %  Basic metabolic panel     Status: Abnormal   Collection Time: 07/07/23  4:55 AM  Result Value Ref Range   Sodium 127 (L) 135 - 145 mmol/L   Potassium 4.0 3.5 - 5.1 mmol/L   Chloride 99 98 - 111 mmol/L   CO2 25 22 - 32 mmol/L   Glucose, Bld 101 (H) 70 - 99 mg/dL   BUN 8 8 - 23 mg/dL   Creatinine, Ser 7.84 0.44 - 1.00 mg/dL   Calcium 9.3 8.9 - 69.6 mg/dL   GFR, Estimated >29 >52 mL/min   Anion gap 3 (L) 5 - 15   No results found.  Assessment/Plan: Diagnosis: 89 yo female with Sjogren's/SLE s/p fall who suffered a right SDH Does the need for close, 24 hr/day medical supervision in concert with the patient's rehab needs make it unreasonable for this patient to be served in a less intensive setting? Yes Co-Morbidities requiring supervision/potential complications:  -SLE/Sjogren's -scalp  injury -HTN -anxiety disorder Due to bladder management, bowel management, safety, skin/wound care, disease management, medication administration, pain management, and patient education, does the patient require 24 hr/day rehab nursing? Yes Does the patient require coordinated care of a physician,  rehab nurse, therapy disciplines of PT, OT, SLP to address physical and functional deficits in the context of the above medical diagnosis(es)? Yes Addressing deficits in the following areas: balance, endurance, locomotion, strength, transferring, bowel/bladder control, bathing, dressing, feeding, grooming, toileting, cognition, speech, and psychosocial support Can the patient actively participate in an intensive therapy program of at least 3 hrs of therapy per day at least 5 days per week? Yes The potential for patient to make measurable gains while on inpatient rehab is excellent Anticipated functional outcomes upon discharge from inpatient rehab are supervision  with PT, supervision with OT, supervision with SLP. Estimated rehab length of stay to reach the above functional goals is: 9-14 days Anticipated discharge destination: Home Overall Rehab/Functional Prognosis: excellent  POST ACUTE RECOMMENDATIONS: This patient's condition is appropriate for continued rehabilitative care in the following setting: CIR Patient has agreed to participate in recommended program.  Spoke to daughter at length who wants inpatient rehab Note that insurance prior authorization may be required for reimbursement for recommended care.  Comment: Pt was independent at a household level prior to admit. Does have a history of falls over the last 2 years or so. Family is willing to provide needed supervision for her once she gets home. I spoke to them about the potential need to address her living situation and safety in the long term. Rehab Admissions Coordinator to follow up.     MEDICAL RECOMMENDATIONS: Avoid  benzodiazepines if possible in brain injury population. If agitation is severe consider low dose seroquel 12.5mg . Otherwise work on environmental modifications (ie quiet room), address issue which might be leading to agitation, etc.    I have personally performed a face to face diagnostic evaluation of this patient. Additionally, I have examined the patient's medical record including any pertinent labs and radiographic images.    Thanks,  Ranelle Oyster, MD 07/07/2023

## 2023-07-07 NOTE — Progress Notes (Signed)
 PT Cancellation Note  Patient Details Name: LAURELYN TERRERO MRN: [id number] DOB: [birth year]   Cancelled Treatment:    Reason Eval/Treat Not Completed: Fatigue/lethargy limiting ability to participate;Patient's level of consciousness (Per RN, pt was agitated earlier and given a xanax. Pt minimally responsive to verbal, tactile, and painful stimuli. Will try again tomorrow when pt is more alert.)   Gladys Damme 07/07/2023, 2:10 PM

## 2023-07-07 NOTE — Care Management Important Message (Signed)
 Important Message  Patient Details  Name: Peggy Ramirez MRN: [id number] Date of Birth: January 06, 1934   Important Message Given:  Yes - Medicare IM     Dorena Bodo 07/07/2023, 2:59 PM

## 2023-07-08 ENCOUNTER — Inpatient Hospital Stay (HOSPITAL_COMMUNITY)
Admission: AD | Admit: 2023-07-08 | Discharge: 2023-07-25 | DRG: 945 | Disposition: A | Discharge status: Home Health | Source: Intra-hospital | Attending: Physical Medicine and Rehabilitation | Admitting: Physical Medicine and Rehabilitation

## 2023-07-08 DIAGNOSIS — S066X0A Traumatic subarachnoid hemorrhage without loss of consciousness, initial encounter: Secondary | ICD-10-CM | POA: Diagnosis not present

## 2023-07-08 DIAGNOSIS — N052 Unspecified nephritic syndrome with diffuse membranous glomerulonephritis: Secondary | ICD-10-CM | POA: Diagnosis present

## 2023-07-08 DIAGNOSIS — R918 Other nonspecific abnormal finding of lung field: Secondary | ICD-10-CM | POA: Diagnosis not present

## 2023-07-08 DIAGNOSIS — R519 Headache, unspecified: Secondary | ICD-10-CM | POA: Diagnosis not present

## 2023-07-08 DIAGNOSIS — S065X9D Traumatic subdural hemorrhage with loss of consciousness of unspecified duration, subsequent encounter: Secondary | ICD-10-CM | POA: Diagnosis not present

## 2023-07-08 DIAGNOSIS — R32 Unspecified urinary incontinence: Secondary | ICD-10-CM

## 2023-07-08 DIAGNOSIS — Z79899 Other long term (current) drug therapy: Secondary | ICD-10-CM

## 2023-07-08 DIAGNOSIS — I6200 Nontraumatic subdural hemorrhage, unspecified: Secondary | ICD-10-CM | POA: Diagnosis not present

## 2023-07-08 DIAGNOSIS — I700 Atherosclerosis of aorta: Secondary | ICD-10-CM | POA: Diagnosis not present

## 2023-07-08 DIAGNOSIS — Z888 Allergy status to other drugs, medicaments and biological substances status: Secondary | ICD-10-CM

## 2023-07-08 DIAGNOSIS — B965 Pseudomonas (aeruginosa) (mallei) (pseudomallei) as the cause of diseases classified elsewhere: Secondary | ICD-10-CM | POA: Diagnosis present

## 2023-07-08 DIAGNOSIS — E7800 Pure hypercholesterolemia, unspecified: Secondary | ICD-10-CM | POA: Diagnosis not present

## 2023-07-08 DIAGNOSIS — K5900 Constipation, unspecified: Secondary | ICD-10-CM | POA: Diagnosis present

## 2023-07-08 DIAGNOSIS — R7401 Elevation of levels of liver transaminase levels: Secondary | ICD-10-CM | POA: Diagnosis not present

## 2023-07-08 DIAGNOSIS — M3214 Glomerular disease in systemic lupus erythematosus: Secondary | ICD-10-CM | POA: Diagnosis present

## 2023-07-08 DIAGNOSIS — Z818 Family history of other mental and behavioral disorders: Secondary | ICD-10-CM

## 2023-07-08 DIAGNOSIS — S065X0A Traumatic subdural hemorrhage without loss of consciousness, initial encounter: Secondary | ICD-10-CM | POA: Diagnosis not present

## 2023-07-08 DIAGNOSIS — E876 Hypokalemia: Secondary | ICD-10-CM | POA: Diagnosis present

## 2023-07-08 DIAGNOSIS — R29818 Other symptoms and signs involving the nervous system: Secondary | ICD-10-CM | POA: Diagnosis not present

## 2023-07-08 DIAGNOSIS — M329 Systemic lupus erythematosus, unspecified: Secondary | ICD-10-CM | POA: Diagnosis not present

## 2023-07-08 DIAGNOSIS — S065X9A Traumatic subdural hemorrhage with loss of consciousness of unspecified duration, initial encounter: Secondary | ICD-10-CM

## 2023-07-08 DIAGNOSIS — S065XAA Traumatic subdural hemorrhage with loss of consciousness status unknown, initial encounter: Principal | ICD-10-CM | POA: Diagnosis present

## 2023-07-08 DIAGNOSIS — R059 Cough, unspecified: Secondary | ICD-10-CM | POA: Diagnosis not present

## 2023-07-08 DIAGNOSIS — M3500 Sicca syndrome, unspecified: Secondary | ICD-10-CM | POA: Diagnosis not present

## 2023-07-08 DIAGNOSIS — Z8673 Personal history of transient ischemic attack (TIA), and cerebral infarction without residual deficits: Secondary | ICD-10-CM | POA: Diagnosis not present

## 2023-07-08 DIAGNOSIS — I10 Essential (primary) hypertension: Secondary | ICD-10-CM | POA: Diagnosis not present

## 2023-07-08 DIAGNOSIS — I63522 Cerebral infarction due to unspecified occlusion or stenosis of left anterior cerebral artery: Secondary | ICD-10-CM | POA: Diagnosis not present

## 2023-07-08 DIAGNOSIS — W19XXXD Unspecified fall, subsequent encounter: Secondary | ICD-10-CM | POA: Diagnosis present

## 2023-07-08 DIAGNOSIS — R4189 Other symptoms and signs involving cognitive functions and awareness: Secondary | ICD-10-CM | POA: Diagnosis not present

## 2023-07-08 DIAGNOSIS — N390 Urinary tract infection, site not specified: Secondary | ICD-10-CM

## 2023-07-08 DIAGNOSIS — Z880 Allergy status to penicillin: Secondary | ICD-10-CM

## 2023-07-08 DIAGNOSIS — S065X9S Traumatic subdural hemorrhage with loss of consciousness of unspecified duration, sequela: Secondary | ICD-10-CM | POA: Diagnosis not present

## 2023-07-08 DIAGNOSIS — S0101XD Laceration without foreign body of scalp, subsequent encounter: Secondary | ICD-10-CM

## 2023-07-08 DIAGNOSIS — H9190 Unspecified hearing loss, unspecified ear: Secondary | ICD-10-CM | POA: Diagnosis present

## 2023-07-08 DIAGNOSIS — N3941 Urge incontinence: Secondary | ICD-10-CM | POA: Diagnosis present

## 2023-07-08 DIAGNOSIS — N39498 Other specified urinary incontinence: Secondary | ICD-10-CM | POA: Diagnosis not present

## 2023-07-08 DIAGNOSIS — S065XAD Traumatic subdural hemorrhage with loss of consciousness status unknown, subsequent encounter: Secondary | ICD-10-CM | POA: Diagnosis not present

## 2023-07-08 DIAGNOSIS — I609 Nontraumatic subarachnoid hemorrhage, unspecified: Secondary | ICD-10-CM | POA: Diagnosis not present

## 2023-07-08 DIAGNOSIS — R5381 Other malaise: Secondary | ICD-10-CM | POA: Diagnosis present

## 2023-07-08 DIAGNOSIS — K219 Gastro-esophageal reflux disease without esophagitis: Secondary | ICD-10-CM | POA: Diagnosis present

## 2023-07-08 DIAGNOSIS — Z881 Allergy status to other antibiotic agents status: Secondary | ICD-10-CM | POA: Diagnosis not present

## 2023-07-08 DIAGNOSIS — E871 Hypo-osmolality and hyponatremia: Secondary | ICD-10-CM | POA: Diagnosis not present

## 2023-07-08 DIAGNOSIS — R414 Neurologic neglect syndrome: Secondary | ICD-10-CM | POA: Diagnosis not present

## 2023-07-08 DIAGNOSIS — I6523 Occlusion and stenosis of bilateral carotid arteries: Secondary | ICD-10-CM | POA: Diagnosis not present

## 2023-07-08 DIAGNOSIS — E222 Syndrome of inappropriate secretion of antidiuretic hormone: Secondary | ICD-10-CM

## 2023-07-08 DIAGNOSIS — S069XAS Unspecified intracranial injury with loss of consciousness status unknown, sequela: Secondary | ICD-10-CM | POA: Diagnosis not present

## 2023-07-08 DIAGNOSIS — G9341 Metabolic encephalopathy: Secondary | ICD-10-CM | POA: Diagnosis not present

## 2023-07-08 DIAGNOSIS — R509 Fever, unspecified: Secondary | ICD-10-CM | POA: Diagnosis not present

## 2023-07-08 DIAGNOSIS — R9082 White matter disease, unspecified: Secondary | ICD-10-CM | POA: Diagnosis not present

## 2023-07-08 DIAGNOSIS — B952 Enterococcus as the cause of diseases classified elsewhere: Secondary | ICD-10-CM | POA: Diagnosis present

## 2023-07-08 DIAGNOSIS — F419 Anxiety disorder, unspecified: Secondary | ICD-10-CM | POA: Diagnosis present

## 2023-07-08 DIAGNOSIS — R404 Transient alteration of awareness: Secondary | ICD-10-CM | POA: Diagnosis not present

## 2023-07-08 DIAGNOSIS — K5901 Slow transit constipation: Secondary | ICD-10-CM | POA: Diagnosis not present

## 2023-07-08 LAB — BASIC METABOLIC PANEL
Anion gap: 6 (ref 5–15)
BUN: 11 mg/dL (ref 8–23)
CO2: 24 mmol/L (ref 22–32)
Calcium: 10 mg/dL (ref 8.9–10.3)
Chloride: 100 mmol/L (ref 98–111)
Creatinine, Ser: 0.74 mg/dL (ref 0.44–1.00)
GFR, Estimated: >60 mL/min (ref >60)
Glucose, Bld: 89 mg/dL (ref 70–99)
Potassium: 3.5 mmol/L (ref 3.5–5.1)
Sodium: 130 mmol/L — ABNORMAL LOW (ref 135–145)

## 2023-07-08 LAB — OSMOLALITY: Osmolality: 277 mosm/kg (ref 275–295)

## 2023-07-08 MED ORDER — DOCUSATE SODIUM 100 MG PO CAPS
100.0000 mg | ORAL_CAPSULE | Freq: Every day | ORAL | Status: DC
Start: 2023-07-09 — End: 2023-07-12
  Administered 2023-07-09 – 2023-07-12 (×4): 100 mg via ORAL
  Filled 2023-07-08 (×4): qty 1

## 2023-07-08 MED ORDER — LEVETIRACETAM 500 MG PO TABS: 500.0000 mg | ORAL_TABLET | Freq: Two times a day (BID) | ORAL | Status: DC

## 2023-07-08 MED ORDER — QUETIAPINE FUMARATE 25 MG PO TABS
12.5000 mg | ORAL_TABLET | Freq: Every evening | ORAL | Status: DC | PRN
Start: 2023-07-08 — End: 2023-07-14
  Administered 2023-07-09 – 2023-07-14 (×3): 12.5 mg via ORAL
  Filled 2023-07-08 (×3): qty 1

## 2023-07-08 MED ORDER — AMLODIPINE BESYLATE 2.5 MG PO TABS
2.5000 mg | ORAL_TABLET | Freq: Every day | ORAL | Status: DC
  Administered 2023-07-09 – 2023-07-12 (×4): 2.5 mg via ORAL
  Filled 2023-07-08 (×4): qty 1

## 2023-07-08 MED ORDER — SIMVASTATIN 20 MG PO TABS
20.0000 mg | ORAL_TABLET | Freq: Every day | ORAL | Status: DC
Start: 2023-07-09 — End: 2023-07-25
  Administered 2023-07-09 – 2023-07-25 (×17): 20 mg via ORAL
  Filled 2023-07-08 (×17): qty 1

## 2023-07-08 MED ORDER — PANTOPRAZOLE SODIUM 40 MG PO TBEC
40.0000 mg | DELAYED_RELEASE_TABLET | Freq: Every day | ORAL | Status: DC
  Administered 2023-07-09 – 2023-07-16 (×8): 40 mg via ORAL
  Filled 2023-07-08 (×8): qty 1

## 2023-07-08 MED ORDER — SODIUM CHLORIDE 1 G PO TABS: 1.0000 g | ORAL_TABLET | Freq: Three times a day (TID) | ORAL | Status: DC

## 2023-07-08 MED ORDER — POLYETHYLENE GLYCOL 3350 17 GM/SCOOP PO POWD: 17.0000 g | Freq: Every day | ORAL | Status: DC | PRN

## 2023-07-08 MED ORDER — QUETIAPINE FUMARATE 25 MG PO TABS: 12.5000 mg | ORAL_TABLET | Freq: Every evening | ORAL | 0 refills | Status: DC | PRN

## 2023-07-08 MED ORDER — POLYVINYL ALCOHOL 1.4 % OP SOLN: 2.0000 [drp] | OPHTHALMIC | Status: DC | PRN

## 2023-07-08 MED ORDER — SODIUM CHLORIDE 1 G PO TABS: 1.0000 g | ORAL_TABLET | Freq: Three times a day (TID) | ORAL | 0 refills | Status: DC

## 2023-07-08 MED ORDER — LEVETIRACETAM 500 MG PO TABS
500.0000 mg | ORAL_TABLET | Freq: Two times a day (BID) | ORAL | Status: AC
  Administered 2023-07-08 – 2023-07-10 (×5): 500 mg via ORAL
  Filled 2023-07-08 (×5): qty 1

## 2023-07-08 MED ORDER — VITAMIN C 500 MG PO TABS
1000.0000 mg | ORAL_TABLET | Freq: Every day | ORAL | Status: DC
Start: 2023-07-09 — End: 2023-07-25
  Administered 2023-07-09 – 2023-07-25 (×17): 1000 mg via ORAL
  Filled 2023-07-08 (×17): qty 2

## 2023-07-08 MED ORDER — ACETAMINOPHEN 325 MG PO TABS: 650.0000 mg | ORAL_TABLET | ORAL | Status: DC | PRN

## 2023-07-08 MED ORDER — ADULT MULTIVITAMIN W/MINERALS CH
1.0000 | ORAL_TABLET | Freq: Every day | ORAL | Status: DC
  Administered 2023-07-09 – 2023-07-25 (×17): 1 via ORAL
  Filled 2023-07-08 (×17): qty 1

## 2023-07-08 MED ORDER — LORATADINE 10 MG PO TABS: 10.0000 mg | ORAL_TABLET | Freq: Every day | ORAL | Status: DC | PRN

## 2023-07-08 MED ORDER — TRAZODONE HCL 50 MG PO TABS
75.0000 mg | ORAL_TABLET | Freq: Every day | ORAL | Status: DC
  Administered 2023-07-08 – 2023-07-15 (×8): 75 mg via ORAL
  Filled 2023-07-08 (×8): qty 2

## 2023-07-08 NOTE — H&P (Signed)
 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 Clelia Schaumann, history of CVA on Plavix,Anxiety.  Per chart review patient lives alone.  1 level home one-step to entry.  Reportedly independent prior to admission but recently started using a walker.  She does not drive.  Family takes her grocery shopping.  Presented 07/04/2023 after reported fall without loss of consciousness.  She was initially seen at [facility] and a CT of the head demonstrated a right sided subdural hemorrhage measuring 9-10 mm without midline shift.  Patient did sustain a scalp laceration stapled in the ED.  CT cervical spine did not demonstrate any traumatic injury.  Admission chemistries were unremarkable.  She was transferred to [facility] and receive neurosurgical follow-up consultation Dr. Hoyt Koch with conservative care.  Placed on Keppra x 7 days for seizure prophylaxis.  Latest follow-up CT imaging showed stable right subdural hematoma.  She has had bouts of hypotension early on in her hospital stay requiring fluid boluses.  Hyponatremia 127-130 received DDAVP.  Patient with intermittent bouts of agitation or restlessness placed on Seroquel.  Her Plavix for history of CVA remains on hold due to SDH.  Therapy evaluations completed due to patient decreased functional mobility was admitted for a comprehensive rehab program. Son at bedside notes that patient is more alert today.  Review of Systems  Constitutional:  Negative for chills and fever.  HENT:  Negative for hearing loss.   Eyes:  Negative for blurred vision and double vision.  Respiratory:  Negative for cough, shortness of breath and wheezing.   Cardiovascular:  Negative for chest pain, palpitations and leg  swelling.  Gastrointestinal:  Positive for constipation. Negative for heartburn, nausea and vomiting.       GERD  Genitourinary:  Negative for dysuria, flank pain and hematuria.  Musculoskeletal:  Positive for myalgias.  Skin:  Negative for rash.  Neurological:  Positive for weakness and headaches.  Psychiatric/Behavioral:         Anxiety  All other systems reviewed and are negative.  Past Medical History:  Diagnosis Date   Acute metabolic encephalopathy    Chronic constipation    Chronic left lower quadrant pain    GERD (gastroesophageal reflux disease)    Headache(784.0)    High cholesterol    Hydrosalpinx    LEFT   Hypertension    Lupus    Membranous glomerulonephritis    followed by Dr. Clelia Schaumann   Ovarian fibroma    RIGHT   Pelvic adhesions    Sjogren's syndrome (HCC)    Stroke [facility])    Past Surgical History:  Procedure Laterality Date   APPENDECTOMY  04/28/2000   BIOPSY  12/17/2020   Procedure: BIOPSY;  Surgeon: Lanelle Bal, DO;  Location: [facility] ENDO SUITE;  Service: Endoscopy;;   COLONOSCOPY WITH PROPOFOL N/A 12/17/2020   Procedure: COLONOSCOPY WITH PROPOFOL;  Surgeon: Lanelle Bal, DO;  Location: [facility] ENDO SUITE;  Service: Endoscopy;  Laterality: N/A;   FRACTURE SURGERY     HIP PINNING,CANNULATED Left 12/15/2019   Procedure: CANNULATED HIP PINNING;  Surgeon: Roby Lofts, MD;  Location: [facility] OR;  Service: Orthopedics;  Laterality: Left;   LAPAROSCOPIC CHOLECYSTECTOMY  04/28/2000   TONSILLECTOMY  04/29/1939   TOTAL ABDOMINAL HYSTERECTOMY W/ BILATERAL SALPINGOOPHORECTOMY  04/29/2003   VAGINAL  HYSTERECTOMY  04/29/1979   Family History  Problem Relation Age of Onset   Heart disease Other    Anxiety disorder Other    Depression Other    Social History:  reports that she has never smoked. She has never used smokeless tobacco. She reports that she does not drink alcohol and does not use drugs. Allergies:  Allergies  Allergen Reactions   Atorvastatin  Other (See Comments)    Muscle aches   Cellcept [Mycophenolate Mofetil] Other (See Comments)    unknown   Medrol [Methylprednisolone] Other (See Comments)    unknown   Mycophenolate Mofetil Other (See Comments)    unknown   Penicillins Itching   Sulfonamide Derivatives Nausea Only   Topiramate Other (See Comments)    unknown   Verapamil Other (See Comments)    Felt shakey   Levofloxacin Rash   Medications Prior to Admission  Medication Sig Dispense Refill   amLODipine (NORVASC) 2.5 MG tablet Take 1 tablet (2.5 mg total) by mouth daily. 90 tablet 1   Ascorbic Acid (VITAMIN C) 1000 MG tablet Take 1,000 mg by mouth daily.     Carboxymethylcellulose Sodium (REFRESH LIQUIGEL OP) Place 1 drop into both eyes in the morning, at noon, in the evening, and at bedtime. Gel drops     docusate sodium (COLACE) 100 MG capsule Take 100 mg by mouth daily as needed for moderate constipation.     levETIRAcetam (KEPPRA) 500 MG tablet Take 1 tablet (500 mg total) by mouth 2 (two) times daily for 4 days.     loratadine (CLARITIN) 10 MG tablet Take 10 mg by mouth daily as needed for allergies.     Misc. Devices (3-IN-1 BEDSIDE TOILET) MISC Patient needs 3 in 1 bedside toilet. Recent hip fracture with repair 1 each 1   Multiple Vitamin (MULTIVITAMIN) tablet Take 1 tablet by mouth daily.     pantoprazole (PROTONIX) 40 MG tablet Take 40 mg by mouth daily.     polyethylene glycol powder (GLYCOLAX/MIRALAX) 17 GM/SCOOP powder Take 17 g by mouth daily as needed for mild constipation. 255 g 0   QUEtiapine (SEROQUEL) 25 MG tablet Take 0.5 tablets (12.5 mg total) by mouth at bedtime as needed (agitation). 30 tablet 0   simvastatin (ZOCOR) 20 MG tablet Take 20 mg by mouth daily.     sodium chloride 1 g tablet Take 1 tablet (1 g total) by mouth 3 (three) times daily with meals. 90 tablet 0   traZODone (DESYREL) 50 MG tablet Take 75 mg by mouth at bedtime.             Home: Home Living Family/patient expects to  be discharged to:: Inpatient rehab Living Arrangements: Alone Available Help at Discharge: Family, Available PRN/intermittently ("most of the time") Type of Home: House Home Access: Stairs to enter [employer] of Steps: 1 to porch and then threahold Entrance Stairs-Rails: None Home Layout: One level Bathroom Shower/Tub: Health visitor: Handicapped height Home Equipment: Cane - single point, Agricultural consultant (2 wheels), BSC/3in1 Additional Comments: son present in room providing history   Functional History: Prior Function Prior Level of Function : Independent/Modified Independent Mobility Comments: does not typically use AD but recently using walker ADLs Comments: does not drive, family takes her grocery shopping   Functional Status:  Mobility: Bed Mobility Overal bed mobility: Needs Assistance Bed Mobility: Supine to Sit Supine to sit: Min assist General bed mobility comments: seated EOB with OT at start Transfers Overall transfer level: Needs  assistance Equipment used: 1 person hand held assist, 2 person hand held assist Transfers: Sit to/from Stand, Bed to chair/wheelchair/BSC Sit to Stand: Min assist Bed to/from chair/wheelchair/BSC transfer type:: Stand pivot Stand pivot transfers: Min assist General transfer comment: min assist for power up with pt holding/hugginh therapist for support. slight posterior lean and pt required min assist to steady with static stand. Ambulation/Gait Ambulation/Gait assistance: Min assist, Mod assist Gait Distance (Feet): 6 Feet Assistive device: Rolling walker (2 wheels) Gait Pattern/deviations: Step-through pattern, Decreased stride length, Decreased step length - left, Decreased step length - right, Knee flexed in stance - left, Knee flexed in stance - right, Shuffle, Trunk flexed, Narrow base of support General Gait Details: Min-mod assist with pt hugging therapist and HHA provided to stabilize balance and guide  trun BSC>recliner. +2 assist for lines. Gait velocity: decr   ADL: ADL Overall ADL's : Needs assistance/impaired Grooming: Oral care, Moderate assistance, Bed level Grooming Details (indicate cue type and reason): mod cues for sequencing, opening eyes to be able to place dentures back in mouth Upper Body Bathing: Minimal assistance, Sitting Lower Body Bathing: Maximal assistance, Sit to/from stand Upper Body Dressing : Minimal assistance, Sitting Lower Body Dressing: Maximal assistance, Sit to/from stand Toilet Transfer: Minimal assistance, +2 for physical assistance, +2 for safety/equipment, Stand-pivot Functional mobility during ADLs: Minimal assistance   Cognition: Cognition Orientation Level: Oriented to person, Oriented to place, Oriented to situation, Disoriented to time Cognition Arousal: Lethargic Behavior During Therapy: WFL for tasks assessed/performed, Impulsive    Physical Exam: Blood pressure (!) 153/64, pulse 68, temperature 99 F (37.2 C), temperature source Oral, resp. rate 18, SpO2 98%.  Gen: no distress, normal appearing HEENT: oral mucosa pink and moist, NCAT Cardio: Reg rate Chest: normal effort, normal rate of breathing Abd: soft, non-distended Ext: no edema Psych: pleasant, normal affect Skin: Scalp laceration clean and dry. Neurological:     Comments: Patient patient appeared sedated but was arousable.  Makes eye contact with examiner.  Follows basic commands.  Needed multiple cues for year and date.  Overall examination was limited by cognition.  Results for orders placed or performed during the hospital encounter of 07/04/23 (from the past 48 hours)  CBC     Status: Abnormal   Collection Time: 07/07/23  4:55 AM  Result Value Ref Range   WBC 8.8 4.0 - 10.5 K/uL   RBC 3.57 (L) 3.87 - 5.11 MIL/uL   Hemoglobin 11.4 (L) 12.0 - 15.0 g/dL   HCT 13.2 (L) 44.0 - 10.2 %   MCV 91.9 80.0 - 100.0 fL   MCH 31.9 26.0 - 34.0 pg   MCHC 34.8 30.0 - 36.0 g/dL    RDW 72.5 36.6 - 44.0 %   Platelets 183 150 - 400 K/uL   nRBC 0.0 0.0 - 0.2 %    Comment: Performed at [facility], [address], [place], [place] [address]  Basic metabolic panel     Status: Abnormal   Collection Time: 07/07/23  4:55 AM  Result Value Ref Range   Sodium 127 (L) 135 - 145 mmol/L   Potassium 4.0 3.5 - 5.1 mmol/L   Chloride 99 98 - 111 mmol/L   CO2 25 22 - 32 mmol/L   Glucose, Bld 101 (H) 70 - 99 mg/dL    Comment: Glucose reference range applies only to samples taken after fasting for at least 8 hours.   BUN 8 8 - 23 mg/dL   Creatinine, Ser 5.95 0.44 -  1.00 mg/dL   Calcium 9.3 8.9 - 65.7 mg/dL   GFR, Estimated >84 >69 mL/min    Comment: (NOTE) Calculated using the CKD-EPI Creatinine Equation (2021)    Anion gap 3 (L) 5 - 15    Comment: Performed at [facility], [address] [place], [place] [address]  Osmolality     Status: None   Collection Time: 07/08/23  6:53 AM  Result Value Ref Range   Osmolality 277 275 - 295 mOsm/kg    Comment: Performed at [facility], [address], [place], [place] [address]  Basic metabolic panel     Status: Abnormal   Collection Time: 07/08/23  6:53 AM  Result Value Ref Range   Sodium 130 (L) 135 - 145 mmol/L   Potassium 3.5 3.5 - 5.1 mmol/L   Chloride 100 98 - 111 mmol/L   CO2 24 22 - 32 mmol/L   Glucose, Bld 89 70 - 99 mg/dL    Comment: Glucose reference range applies only to samples taken after fasting for at least 8 hours.   BUN 11 8 - 23 mg/dL   Creatinine, Ser 4.40 0.44 - 1.00 mg/dL   Calcium 10.2 8.9 - 72.5 mg/dL   GFR, Estimated >36 >64 mL/min    Comment: (NOTE) Calculated using the CKD-EPI Creatinine Equation (2021)    Anion gap 6 5 - 15    Comment: Performed at [facility], [address], [place], [place] [address]   No results found.    Blood pressure (!) 153/64, pulse 68, temperature 99 F (37.2 C), temperature source Oral, resp. rate 18, SpO2 98%.  Medical Problem List and  Plan: 1. Functional deficits secondary to traumatic SDH with scalp laceration stapled in the ED as well as history Sjorgens syndrome/SLE  -patient may shower  -ELOS/Goals: 10-14 days S  Admit to CIR 2.  Antithrombotics: -DVT/anticoagulation:  Mechanical: Antiembolism stockings, thigh (TED hose) Bilateral lower extremities  -antiplatelet therapy: N/A 3. Pain Management: Tylenol as needed 4. Mood/Behavior/Sleep: Trazodone 75 mg nightly.  Provide emotional support  -antipsychotic agents: Seroquel nightly as needed 5. Neuropsych/cognition: This patient is not capable of making decisions on her own behalf. 6. Skin/Wound Care: Routine skin checks 7. Fluids/Electrolytes/Nutrition: Routine in and outs with follow-up chemistries 8.  Seizure prophylaxis.  Continue 7-day course of Keppra.  9.  History of CVA.  Plavix currently on hold due to SDH.  10.  Hyperlipidemia.  Continue Zocor  11.  Hypertension.  Restart Norvasc 2.5mg  daily.  12.  Hyponatremia.  continue sodium chloride tablets.  Follow-up chemistries  13.  History of membranous glomerulonephritis.  Patient not on immunosuppression followed by Dr. Clelia Schaumann  I have personally performed a face to face diagnostic evaluation, including, but not limited to relevant history and physical exam findings, of this patient and developed relevant assessment and plan.  Additionally, I have reviewed and concur with the physician assistant's documentation above.  Mcarthur Rossetti Angiulli, PA-C    Horton Chin, MD 07/08/2023

## 2023-07-08 NOTE — Plan of Care (Signed)
  Problem: Education: Goal: Knowledge of General Education information will improve Description: Including pain rating scale, medication(s)/side effects and non-pharmacologic comfort measures 07/08/2023 1500 by Genevie Ann, RN Outcome: Progressing 07/08/2023 1455 by Genevie Ann, RN Outcome: Progressing   Problem: Health Behavior/Discharge Planning: Goal: Ability to manage health-related needs will improve Outcome: Progressing

## 2023-07-08 NOTE — Progress Notes (Signed)
 Occupational Therapy Treatment Patient Details Name: Peggy Ramirez MRN: [id number] DOB: 1933-06-29 Today's Date: 07/08/2023   History of present illness Pt presenting 3/8 s/p fall at home hitting her head; denies LOC. CT head showed scattered small contusions, small R SDH. She was transferred to [facility] where subsequent CT showed her R SDH had enlarged. Third CT showed stability. PMH significant for Sjogren's, HTN, SLE, membranous glomerulonephritis, history CVA on Plavix.   OT comments  Pt progressing toward established OT goals. Pt emotionally labile and internally distracted affecting attention and command following; overall confused. Challenging strength, balance and cognition with LB ADL, functional mobility and grooming at sink. Mod cueing for cessation of washing face at sink. Internally distracted and with poor STM stating "I have lost my teeth, and then once re-oriented to teeth in denture cup, needing max cues to recall 1 minute later. Direct step-by-step cues for donning dentures. Pt needing min A and cues for safety during functional mobility with RW this session. Pt with good progression and dc plan of inpatient rehab >3 hours/day remains appropriate.       If plan is discharge home, recommend the following:  A lot of help with walking and/or transfers;A lot of help with bathing/dressing/bathroom;Assistance with cooking/housework;Direct supervision/assist for medications management;Direct supervision/assist for financial management;Assist for transportation;Help with stairs or ramp for entrance   Equipment Recommendations  BSC/3in1    Recommendations for Other Services      Precautions / Restrictions Precautions Precautions: None Restrictions Weight Bearing Restrictions Per Provider Order: No       Mobility Bed Mobility Overal bed mobility: Needs Assistance Bed Mobility: Supine to Sit, Sit to Supine     Supine to sit: Min assist Sit to supine: Contact guard assist    General bed mobility comments: min A for sequencing to come to EOB    Transfers Overall transfer level: Needs assistance Equipment used: Rolling walker (2 wheels) Transfers: Sit to/from Stand Sit to Stand: Min assist           General transfer comment: cues for hand placement. steadying assist on rise.     Balance Overall balance assessment: Needs assistance Sitting-balance support: No upper extremity supported, Bilateral upper extremity supported, Single extremity supported, Feet supported Sitting balance-Leahy Scale: Poor     Standing balance support: Single extremity supported, Bilateral upper extremity supported, During functional activity Standing balance-Leahy Scale: Poor                             ADL either performed or assessed with clinical judgement   ADL Overall ADL's : Needs assistance/impaired     Grooming: Wash/dry face;Minimal assistance;Standing;Cueing for sequencing Grooming Details (indicate cue type and reason): cues for attention to task, safety, sequencing, and not repeating same task over again             Lower Body Dressing: Minimal assistance;Sitting/lateral leans;Cueing for sequencing Lower Body Dressing Details (indicate cue type and reason): cues for thoroughness with donning shoes. Placed shoe on foot, but cues to push heel down into shoe Toilet Transfer: Minimal assistance;Rolling walker (2 wheels);Ambulation Toilet Transfer Details (indicate cue type and reason): Min A to intiiate walking and then multimodal cues for safety in smaller spaces with pt attempting to reach outside BOS         Functional mobility during ADLs: Minimal assistance;Rolling walker (2 wheels)      Extremity/Trunk Assessment Upper Extremity Assessment Upper Extremity Assessment: Generalized weakness  Lower Extremity Assessment Lower Extremity Assessment: Defer to PT evaluation        Vision   Vision Assessment?: Vision impaired- to be  further tested in functional context Additional Comments: Continues to close L eye intermittently when focusing. Son stated this has been going on for some time. Pt denies visual changes. Pt with poor command following to participate in formal visual assessment   Perception     Praxis     Communication Communication Communication: Impaired Factors Affecting Communication: Hearing impaired   Cognition Arousal: Lethargic Behavior During Therapy: WFL for tasks assessed/performed, Impulsive Cognition: Cognition impaired   Orientation impairments: Time, Situation, Place (inconsistently oriented to place. Disoriented to time and situation. Constantly asking if this OT was going to "make her cook breakfast") Awareness: Intellectual awareness intact ("my balance is no good") Memory impairment (select all impairments): Short-term memory, Working memory (cues for sequencing through tasks) Attention impairment (select first level of impairment): Sustained attention Executive functioning impairment (select all impairments): Organization, Problem solving, Reasoning, Sequencing OT - Cognition Comments: follows basic one step commands with increased time. pt needing cues for safety and intermittently for sequencing during mobility. Poor safety with RW use noted with pt pushing RW with one hand and attempts to reach outside of RW to furniture with other hand. Pt constantly asking about dinner despite reorientation to it being 8AM and breakfast tray in room.                 Following commands: Impaired Following commands impaired: Follows one step commands with increased time, Follows one step commands inconsistently      Cueing   Cueing Techniques: Verbal cues, Gestural cues, Tactile cues  Exercises      Shoulder Instructions       General Comments VSS    Pertinent Vitals/ Pain       Pain Assessment Pain Assessment: Faces Faces Pain Scale: Hurts little more Pain Location: RLE Pain  Descriptors / Indicators: Aching Pain Intervention(s): Limited activity within patient's tolerance, Monitored during session  Home Living                                          Prior Functioning/Environment              Frequency  Min 1X/week        Progress Toward Goals  OT Goals(current goals can now be found in the care plan section)  Progress towards OT goals: Progressing toward goals  Acute Rehab OT Goals Patient Stated Goal: go home OT Goal Formulation: With patient Time For Goal Achievement: 07/20/23 Potential to Achieve Goals: Good ADL Goals Pt Will Perform Grooming: with set-up;sitting Pt Will Perform Upper Body Dressing: with set-up;sitting Pt Will Perform Lower Body Dressing: with contact guard assist;sit to/from stand Pt Will Transfer to Toilet: with contact guard assist;ambulating  Plan      Co-evaluation                 AM-PAC OT "6 Clicks" Daily Activity     Outcome Measure   Help from another person eating meals?: A Little Help from another person taking care of personal grooming?: A Little Help from another person toileting, which includes using toliet, bedpan, or urinal?: A Lot Help from another person bathing (including washing, rinsing, drying)?: A Lot Help from another person to put on and taking off regular upper body  clothing?: A Little Help from another person to put on and taking off regular lower body clothing?: A Little 6 Click Score: 16    End of Session Equipment Utilized During Treatment: Rolling walker (2 wheels);Gait belt  OT Visit Diagnosis: Unsteadiness on feet (R26.81);Muscle weakness (generalized) (M62.81);Other symptoms and signs involving cognitive function;Pain Pain - Right/Left: Right Pain - part of body: Leg   Activity Tolerance Patient tolerated treatment well   Patient Left in bed;with call bell/phone within reach;with bed alarm set;with nursing/sitter in room;Other (comment) (fall mat  replaced)   Nurse Communication Mobility status        Time: 4098-1191 OT Time Calculation (min): 25 min  Charges: OT General Charges $OT Visit: 1 Visit OT Treatments $Self Care/Home Management : 23-37 mins  Tyler Deis, OTR/L [facility] Acute Rehabilitation Office: [phone]   Myrla Halsted 07/08/2023, 8:50 AM

## 2023-07-08 NOTE — TOC Transition Note (Signed)
 Transition of Care [facility]) - Discharge Note   Patient Details  Name: Peggy Ramirez MRN: [id number] Date of Birth: August 24, 1933  [facility] CM/SW Contact:  Kermit Balo, RN Phone Number: 07/08/2023, 10:35 AM   Clinical Narrative:     Pt is discharging to CIR today. CM signing off.   Final next level of care: IP Rehab Facility Barriers to Discharge: No Barriers Identified   Patient Goals and CMS Choice   CMS Medicare.gov Compare Post Acute Care list provided to:: Patient Choice offered to / list presented to : Patient      Discharge Placement                       Discharge Plan and Services Additional resources added to the After Visit Summary for                                       Social Drivers of Health (SDOH) Interventions SDOH Screenings   Food Insecurity: No Food Insecurity (07/05/2023)  Housing: Low Risk  (07/06/2023)  Transportation Needs: No Transportation Needs (07/05/2023)  Utilities: Not At Risk (07/05/2023)  Social Connections: Moderately Integrated (07/06/2023)  Tobacco Use: Low Risk  (07/04/2023)     Readmission Risk Interventions     No data to display

## 2023-07-08 NOTE — Plan of Care (Signed)
   Problem: Education: Goal: Knowledge of General Education information will improve Description Including pain rating scale, medication(s)/side effects and non-pharmacologic comfort measures Outcome: Progressing   Problem: Health Behavior/Discharge Planning: Goal: Ability to manage health-related needs will improve Outcome: Progressing

## 2023-07-08 NOTE — Progress Notes (Signed)
 Physical Therapy Treatment Patient Details Name: Peggy Ramirez MRN: [id number] DOB: 07/24/1933 Today's Date: 07/08/2023   History of Present Illness Pt presenting 3/8 s/p fall at home hitting her head; denies LOC. CT head showed scattered small contusions, small R SDH. She was transferred to [facility] where subsequent CT showed her R SDH had enlarged. Third CT showed stability. PMH significant for Sjogren's, HTN, SLE, membranous glomerulonephritis, history CVA on Plavix.    PT Comments  Pt with fair tolerance to treatment today. Pt was able to progress ambulation today in hallway with RW Min/Mod A. Similar to OT session, Pt was again emotionally labile and internally distracted affecting attention and command following; overall confused. No change in DC/DME recs at this time. Pt anticipates DC to [facility] today.   If plan is discharge home, recommend the following: A lot of help with walking and/or transfers;A little help with bathing/dressing/bathroom;Assistance with cooking/housework;Assist for transportation;Help with stairs or ramp for entrance;Supervision due to cognitive status;Direct supervision/assist for medications management   Can travel by private vehicle        Equipment Recommendations  Other (comment) (Per accepting facility)    Recommendations for Other Services       Precautions / Restrictions Precautions Precautions: None Restrictions Weight Bearing Restrictions Per Provider Order: No     Mobility  Bed Mobility Overal bed mobility: Needs Assistance Bed Mobility: Supine to Sit, Sit to Supine     Supine to sit: Contact guard Sit to supine: Contact guard assist   General bed mobility comments: CGA for safety.    Transfers Overall transfer level: Needs assistance Equipment used: Rolling walker (2 wheels) Transfers: Sit to/from Stand Sit to Stand: Min assist           General transfer comment: cues for hand placement. steadying assist on rise.     Ambulation/Gait Ambulation/Gait assistance: Min assist, Mod assist Gait Distance (Feet): 45 Feet Assistive device: Rolling walker (2 wheels) Gait Pattern/deviations: Step-through pattern, Decreased stride length, Decreased step length - left, Decreased step length - right, Knee flexed in stance - left, Knee flexed in stance - right, Shuffle, Trunk flexed, Narrow base of support Gait velocity: decr     General Gait Details: Min/Mod A to navigate RW. Pt noted to drift L/R with RW requiring Min A for balance.   Stairs             Wheelchair Mobility     Tilt Bed    Modified Rankin (Stroke Patients Only)       Balance Overall balance assessment: Needs assistance Sitting-balance support: No upper extremity supported, Bilateral upper extremity supported, Single extremity supported, Feet supported Sitting balance-Leahy Scale: Poor     Standing balance support: Single extremity supported, Bilateral upper extremity supported, During functional activity Standing balance-Leahy Scale: Poor                              Communication Communication Communication: Impaired Factors Affecting Communication: Hearing impaired  Cognition Arousal: Alert Behavior During Therapy: Impulsive, Anxious, Lability   PT - Cognitive impairments: Awareness, Memory, Attention, Initiation, Sequencing, Problem solving, Safety/Judgement                       PT - Cognition Comments: Very anxious and labile. Poor safety awareness and command following. Slightly impulsive. Following commands: Impaired Following commands impaired: Follows one step commands with increased time, Follows one step commands inconsistently  Cueing Cueing Techniques: Verbal cues, Gestural cues, Tactile cues  Exercises      General Comments General comments (skin integrity, edema, etc.): VSS      Pertinent Vitals/Pain Pain Assessment Pain Assessment: No/denies pain    Home Living                           Prior Function            PT Goals (current goals can now be found in the care plan section) Progress towards PT goals: Progressing toward goals    Frequency    Min 1X/week      PT Plan      Co-evaluation              AM-PAC PT "6 Clicks" Mobility   Outcome Measure  Help needed turning from your back to your side while in a flat bed without using bedrails?: A Little Help needed moving from lying on your back to sitting on the side of a flat bed without using bedrails?: A Little Help needed moving to and from a bed to a chair (including a wheelchair)?: A Little Help needed standing up from a chair using your arms (e.g., wheelchair or bedside chair)?: A Little Help needed to walk in hospital room?: A Lot Help needed climbing 3-5 steps with a railing? : Total 6 Click Score: 15    End of Session Equipment Utilized During Treatment: Gait belt Activity Tolerance: Patient tolerated treatment well Patient left: in chair;with call bell/phone within reach;with chair alarm set;with family/visitor present;with nursing/sitter in room Nurse Communication: Mobility status PT Visit Diagnosis: Muscle weakness (generalized) (M62.81);Difficulty in walking, not elsewhere classified (R26.2);Other symptoms and signs involving the nervous system (R29.898);Other abnormalities of gait and mobility (R26.89)     Time: 1610-9604 PT Time Calculation (min) (ACUTE ONLY): 10 min  Charges:    $Gait Training: 8-22 mins PT General Charges $$ ACUTE PT VISIT: 1 Visit                     Shela Nevin, PT, DPT Acute Rehab Services [phone]    Gladys Damme 07/08/2023, 2:42 PM

## 2023-07-08 NOTE — Progress Notes (Signed)
 Inpatient Rehab Admissions Coordinator:    I have a CIR bed for this Pt. RN may call report to [phone].  Pt. Will d/c to CIR for an estimated 10-12 days of intensive therapy with the goal of reaching supervision level and returning home with the assistance of her children.   Megan Salon, MS, CCC-SLP Rehab Admissions Coordinator  [phone] (celll) [phone] (office)

## 2023-07-08 NOTE — Progress Notes (Signed)
 PMR Admission Coordinator Pre-Admission Assessment   Patient: Peggy Ramirez is an 89 y.o., female MRN: [id number] DOB: 08/10/1933 Height: 4\' 11"  (149.9 cm) Weight: 36.6 kg   Insurance Information HMO:    PPO:      PCP:      IPA:      80/20:      OTHER:  PRIMARY: UHC Medicare      Policy#: [id number], Medicare: [id number]    Subscriber: Pt CM Name: n/a      Phone#: [phone]     Fax#: [phone] Pre-Cert#: [id number]    Jamie with [employer] called with approval 3/11 for admit 3/11-3/18   Employer:  Benefits:  Phone #:      Name:  Eff Date: 04/29/2023 - 04/27/2024 Deductible: does not have one OOP Max: $3,900 ($85 met) CIR: $295/day co-pay for days 1-5, $0/day co-pay for days 6+  SNF: $0.00 Copayment per day for days 1-20; $203.00 Copayment per day for days 21-100 for Medicare-covered care/maximum 100 days/benefit period  Outpatient: $20/visit co-pay  Home Health:  100% coverage; limited by medical necessity  DME: 80% coverage; 20% co-insurance  Providers: in network   SECONDARY:       Policy#:      Phone#:    The "Data Collection Information Summary" for patients in Inpatient Rehabilitation Facilities with attached "Privacy Act Statement-Health Care Records" was provided and verbally reviewed with: Patient   Emergency Contact Information Contact Information       Name Relation Home Work Mobile    [place] Son     [phone]    [facility] Daughter [phone]        Kiersten, Coss [phone]             Other Contacts   None on File        Current Medical History  Patient Admitting Diagnosis: Fall with R SDH History of Present Illness: Peggy Ramirez is a 89 year old right-handed female with history significant for Sjorgens syndrome, SLE, hypertension, 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 Clelia Schaumann, history of CVA on Plavix,Anxiety.  Per chart review patient lives alone.  1 level home one-step to entry.  Reportedly  independent prior to admission but recently started using a walker.  She does not drive.  Family takes her grocery shopping.  Presented 07/04/2023 after reported fall without loss of consciousness.  She was initially seen at [facility] and a CT of the head demonstrated a right sided subdural hemorrhage measuring 9-10 mm without midline shift.  Patient did sustain a scalp laceration stapled in the ED.  CT cervical spine did not demonstrate any traumatic injury.  Admission chemistries were unremarkable.  She was transferred to [facility] and receive neurosurgical follow-up consultation Dr. Hoyt Koch with conservative care.  Placed on Keppra x 7 days for seizure prophylaxis.  Latest follow-up CT imaging showed stable right subdural hematoma.  She has had bouts of hypotension early on in her hospital stay requiring fluid boluses.  Hyponatremia 127-130 received DDAVP.  Her Plavix for history of CVA remains on hold due to SDH.  Therapy evaluations completed due to patient decreased functional mobility was admitted for a comprehensive rehab program.      Patient's medical record from [facility] has been reviewed by the rehabilitation admission coordinator and physician.   Past Medical History      Past Medical History:  Diagnosis Date   Acute metabolic encephalopathy     Chronic constipation  Chronic left lower quadrant pain     GERD (gastroesophageal reflux disease)     Headache(784.0)     High cholesterol     Hydrosalpinx      LEFT   Hypertension     Lupus     Membranous glomerulonephritis      followed by Dr. Clelia Schaumann   Ovarian fibroma      RIGHT   Pelvic adhesions     Sjogren's syndrome (HCC)     Stroke [facility])        Has the patient had major surgery during 100 days prior to admission? No   Family History   family history includes Anxiety disorder in an other family member; Depression in an other family member; Heart disease in an other family  member.   Current Medications   Current Facility-Administered Medications:    acetaminophen (TYLENOL) tablet 650 mg, 650 mg, Oral, Q4H PRN, Leslye Peer, MD, 650 mg at 07/06/23 1403   ALPRAZolam Prudy Feeler) tablet 0.25 mg, 0.25 mg, Oral, Daily PRN, Bedelia Person, MD, 0.25 mg at 07/05/23 1756   ascorbic acid (VITAMIN C) tablet 1,000 mg, 1,000 mg, Oral, Daily, Leslye Peer, MD, 1,000 mg at 07/06/23 1011   Chlorhexidine Gluconate Cloth 2 % PADS 6 each, 6 each, Topical, Daily, Leslye Peer, MD, 6 each at 07/06/23 0820   docusate sodium (COLACE) capsule 100 mg, 100 mg, Oral, Daily, Leslye Peer, MD, 100 mg at 07/06/23 1011   hydrALAZINE (APRESOLINE) injection 10 mg, 10 mg, Intravenous, Q6H PRN, Leslye Peer, MD   levETIRAcetam (KEPPRA) tablet 500 mg, 500 mg, Oral, BID, Luciano Cutter, MD, 500 mg at 07/06/23 1011   loratadine (CLARITIN) tablet 10 mg, 10 mg, Oral, Daily PRN, Leslye Peer, MD   morphine (PF) 2 MG/ML injection 2-4 mg, 2-4 mg, Intravenous, Q4H PRN, Leslye Peer, MD   multivitamin with minerals tablet 1 tablet, 1 tablet, Oral, Daily, Leslye Peer, MD, 1 tablet at 07/06/23 1011   ondansetron (ZOFRAN) injection 4 mg, 4 mg, Intravenous, Q6H PRN, Leslye Peer, MD   Oral care mouth rinse, 15 mL, Mouth Rinse, PRN, Byrum, Les Pou, MD   pantoprazole (PROTONIX) EC tablet 40 mg, 40 mg, Oral, Daily, Byrum, Les Pou, MD, 40 mg at 07/06/23 1011   polyethylene glycol powder (GLYCOLAX/MIRALAX) container 17 g, 17 g, Oral, Daily PRN, Byrum, Les Pou, MD   polyvinyl alcohol (LIQUIFILM TEARS) 1.4 % ophthalmic solution 2 drop, 2 drop, Both Eyes, PRN, Byrum, Les Pou, MD   simvastatin (ZOCOR) tablet 20 mg, 20 mg, Oral, Daily, Byrum, Les Pou, MD, 20 mg at 07/06/23 1011   traZODone (DESYREL) tablet 75 mg, 75 mg, Oral, QHS, Leslye Peer, MD, 75 mg at 07/05/23 2129   Patients Current Diet:  Diet Order                  Diet Heart Room service appropriate? Yes with Assist;  Fluid consistency: Thin  Diet effective now                         Precautions / Restrictions Precautions Precautions: None Restrictions Weight Bearing Restrictions Per Provider Order: No    Has the patient had 2 or more falls or a fall with injury in the past year? Yes   Prior Activity Level Community (5-7x/wk): Pt. is active in the community   Prior Functional Level Self Care: Did the patient need  help bathing, dressing, using the toilet or eating? Independent   Indoor Mobility: Did the patient need assistance with walking from room to room (with or without device)? Independent   Stairs: Did the patient need assistance with internal or external stairs (with or without device)? Independent   Functional Cognition: Did the patient need help planning regular tasks such as shopping or remembering to take medications? Needed some help   Patient Information Are you of Hispanic, Latino/a,or Spanish origin?: A. No, not of Hispanic, Latino/a, or Spanish origin What is your race?: A. White Do you need or want an interpreter to communicate with a doctor or health care staff?: 1. Yes   Patient's Response To:  Health Literacy and Transportation Is the patient able to respond to health literacy and transportation needs?: Yes Health Literacy - How often do you need to have someone help you when you read instructions, pamphlets, or other written material from your doctor or pharmacy?: Sometimes In the past 12 months, has lack of transportation kept you from medical appointments or from getting medications?: No In the past 12 months, has lack of transportation kept you from meetings, work, or from getting things needed for daily living?: No   Journalist, newspaper / Equipment Home Equipment: Cane - single point, Agricultural consultant (2 wheels), BSC/3in1   Prior Device Use: Indicate devices/aids used by the patient prior to current illness, exacerbation or injury? Walker   Current Functional  Level Cognition   Orientation Level: Oriented to person, Oriented to place, Disoriented to time, Disoriented to situation    Extremity Assessment (includes Sensation/Coordination)   Upper Extremity Assessment: Generalized weakness, Defer to OT evaluation LUE Deficits / Details: decr initiation as opposed to R; holding in flexed position but uses functionally with time and cues.  Lower Extremity Assessment: Generalized weakness     ADLs   Overall ADL's : Needs assistance/impaired Grooming: Oral care, Moderate assistance, Bed level Grooming Details (indicate cue type and reason): mod cues for sequencing, opening eyes to be able to place dentures back in mouth Upper Body Bathing: Minimal assistance, Sitting Lower Body Bathing: Maximal assistance, Sit to/from stand Upper Body Dressing : Minimal assistance, Sitting Lower Body Dressing: Maximal assistance, Sit to/from stand Toilet Transfer: Minimal assistance, +2 for physical assistance, +2 for safety/equipment, Stand-pivot Functional mobility during ADLs: Minimal assistance     Mobility   Overal bed mobility: Needs Assistance Bed Mobility: Supine to Sit Supine to sit: Min assist General bed mobility comments: seated EOB with OT at start     Transfers   Overall transfer level: Needs assistance Equipment used: 1 person hand held assist, 2 person hand held assist Transfers: Sit to/from Stand, Bed to chair/wheelchair/BSC Sit to Stand: Min assist Bed to/from chair/wheelchair/BSC transfer type:: Stand pivot Stand pivot transfers: Min assist General transfer comment: min assist for power up with pt holding/hugginh therapist for support. slight posterior lean and pt required min assist to steady with static stand.     Ambulation / Gait / Stairs / Wheelchair Mobility   Ambulation/Gait Ambulation/Gait assistance: Min assist, Mod assist Gait Distance (Feet): 6 Feet Assistive device: Rolling walker (2 wheels) Gait Pattern/deviations:  Step-through pattern, Decreased stride length, Decreased step length - left, Decreased step length - right, Knee flexed in stance - left, Knee flexed in stance - right, Shuffle, Trunk flexed, Narrow base of support General Gait Details: Min-mod assist with pt hugging therapist and HHA provided to stabilize balance and guide trun BSC>recliner. +2 assist for lines. Gait  velocity: decr     Posture / Balance Balance Overall balance assessment: Needs assistance Sitting-balance support: No upper extremity supported, Bilateral upper extremity supported, Single extremity supported, Feet supported Sitting balance-Leahy Scale: Poor Standing balance support: Single extremity supported, Bilateral upper extremity supported, During functional activity Standing balance-Leahy Scale: Poor     Special needs/care consideration Skin: stitches on scalp and Special service needs TBI therapies     Previous Home Environment (from acute therapy documentation) Living Arrangements: Alone Available Help at Discharge: Family, Available PRN/intermittently ("most of the time") Type of Home: House Home Layout: One level Home Access: Stairs to enter Entrance Stairs-Rails: None Entrance Stairs-Number of Steps: 1 to porch and then threahold [employer] Shower/Tub: Health visitor: Handicapped height Home Care Services: No Additional Comments: son present in room providing history   Discharge Living Setting Plans for Discharge Living Setting: Patient's home Type of Home at Discharge: House Discharge Home Layout: One level Discharge Home Access: Stairs to enter Entrance Stairs-Rails: None Entrance Stairs-Number of Steps: 1+1 Discharge Bathroom Shower/Tub: Walk-in shower Discharge Bathroom Toilet: Handicapped height Discharge Bathroom Accessibility: Yes How Accessible: Accessible via walker Does the patient have any problems obtaining your medications?: No   Social/Family/Support Systems Patient Roles:  Other (Comment) Contact Information: Son Marianny Goris Anticipated Caregiver: [phone] Anticipated Caregiver's Contact Information: [name] will be with Pt. most of the week, other children to cover weekends and other days as needed Ability/Limitations of Caregiver: Min A Caregiver Availability: 24/7 Discharge Plan Discussed with Primary Caregiver: Yes Is Caregiver In Agreement with Plan?: Yes Does Caregiver/Family have Issues with Lodging/Transportation while Pt is in Rehab?: No   Goals Patient/Family Goal for Rehab: PT/OT/SLP Supervision Expected length of stay: 7-10 days Pt/Family Agrees to Admission and willing to participate: Yes Program Orientation Provided & Reviewed with Pt/Caregiver Including Roles  & Responsibilities: Yes   Decrease burden of Care through IP rehab admission: not anticipated   Possible need for SNF placement upon discharge: not anticipated   Patient Condition: This patient's condition remains as documented in the consult dated 07/07/23, in which the Rehabilitation Physician determined and documented that the patient's condition is appropriate for intensive rehabilitative care in an inpatient rehabilitation facility. Will admit to inpatient rehab today.   Preadmission Screen Completed By:  Jeronimo Greaves, CCC-SLP, 07/06/2023 2:25 PM ______________________________________________________________________   Discussed status with Dr. Carlis Abbott on 07/08/23 at 900 and received approval for admission today.   Admission Coordinator:  Jeronimo Greaves, time 900/Date 07/08/23        Cosigned by: Horton Chin, MD at 07/08/2023  8:54 AM

## 2023-07-08 NOTE — Discharge Summary (Signed)
 Physician Discharge Summary   Patient: Peggy Ramirez MRN: [id number] DOB: 29-Feb-1934  Admit date:     07/04/2023  Discharge date: 07/08/23  Discharge Physician: Thad Ranger, MD    PCP: Estanislado Pandy, MD   Recommendations at discharge:   Continue Keppra 500 mg twice daily for 4 more days, stop on 07/12/2023 Once sodium is above 133, please decrease salt tablets and taper it off Hold Plavix  Discharge Diagnoses:   Traumatic subarachnoid hemorrhage (HCC)  SDH (subdural hematoma) (HCC)  Prior CVA  Scalp laceration  Hypertension Hyponatremia Anxiety GERD   Hospital Course:  Patient is a 89 y.o. 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  Patient had a mechanical fall on 3/7 and was evaluated in the ED at [facility] for trauma workup.  Head CT revealed posttraumatic intracranial hemorrhage, scattered bilateral SAH and posttrauma pattern.  There is no midline shift, no evidence of scalp fracture.  Cervical spine CT did not show any traumatic fracture though did reveal a chronic cervical spine degeneration from C4-C6.  Patient was transferred to [facility] and managed in the ICU.  Repeat head CT revealed interval increase in right subdural hematoma with increased mass effect.  Neurosurgery was consulted.  Patient received DDAVP.  She was hypotensive during ICU stay requiring fluid boluses.  Head CT stabilized and neurosurgery cleared patient for discharge to follow-up in 2 weeks in clinic.   Continue to hold Plavix.  Patient was transferred to [facility] on 3/10   Assessment and Plan:   Acute posttraumatic subdural hematoma and subarachnoid hemorrhage - no midline shift or significant neurologic changes on initial head CT, repeat head CT did show increase in right subdural hematoma with mass effect - Neurosurgery was consulted, initially managed in  ICU, status post DDAVP - Stable per neurosurgery, recommended outpatient follow-up in 2 weeks,  Complete 7-day Keppra prophylaxis, hold Plavix and other anticoagulation - SBP goal under 160 - Continue seizure precautions -- PT OT evaluation recommended inpatient rehab   Prior CVA - Hold Plavix in setting of brain bleed as above. - Continue statin  Hyponatremia -Improving sodium 130 today, osmolarity 277, urine osmolality still pending -Patient was placed on salt tabs, once sodium > 133, please titrate salt tabs to off   Scalp laceration - Stapled in ED - Follow-up outpatient for staple removal- suture recommended to be removed in 7 to 10 days   Hypertension - SBP goal under 160 - Titrate home meds to meet this goal   Hyperlipidemia - Continue statin   Anxiety - Uses as needed Xanax at home.  Proceed with caution with antianxiety medications and not further complicate neurologic exam. - Continue Seroquel 12.5 mg daily as needed at bedtime   GERD - Continue PPI   History of cataracts - Chronic dry eyes - Resume home drops         Pain control - [place] Controlled Substance Reporting System database was reviewed. and patient was instructed, not to drive, operate heavy machinery, perform activities at heights, swimming or participation in water activities or provide baby-sitting services while on Pain, Sleep and Anxiety Medications; until their outpatient Physician has advised to do so again. Also recommended to not to take more than prescribed Pain, Sleep and Anxiety Medications.  Consultants: Neurosurgery, PCCM, CIR Procedures performed:   Disposition: Rehabilitation facility/ CIR Diet recommendation: Regular diet  DISCHARGE MEDICATION: Allergies as of 07/08/2023  Reactions   Atorvastatin Other (See Comments)   Muscle aches   Cellcept [mycophenolate Mofetil] Other (See Comments)   unknown   Medrol [methylprednisolone] Other (See Comments)   unknown    Mycophenolate Mofetil Other (See Comments)   unknown   Penicillins Itching   Sulfonamide Derivatives Nausea Only   Topiramate Other (See Comments)   unknown   Verapamil Other (See Comments)   Felt shakey   Levofloxacin Rash        Medication List     STOP taking these medications    ALPRAZolam 0.25 MG tablet Commonly known as: XANAX   clopidogrel 75 MG tablet Commonly known as: PLAVIX       TAKE these medications    3-in-1 Bedside Toilet Misc Patient needs 3 in 1 bedside toilet. Recent hip fracture with repair   amLODipine 2.5 MG tablet Commonly known as: NORVASC Take 1 tablet (2.5 mg total) by mouth daily.   docusate sodium 100 MG capsule Commonly known as: COLACE Take 100 mg by mouth daily as needed for moderate constipation.   levETIRAcetam 500 MG tablet Commonly known as: KEPPRA Take 1 tablet (500 mg total) by mouth 2 (two) times daily for 4 days.   loratadine 10 MG tablet Commonly known as: CLARITIN Take 10 mg by mouth daily as needed for allergies.   multivitamin tablet Take 1 tablet by mouth daily.   pantoprazole 40 MG tablet Commonly known as: PROTONIX Take 40 mg by mouth daily.   polyethylene glycol powder 17 GM/SCOOP powder Commonly known as: GLYCOLAX/MIRALAX Take 17 g by mouth daily as needed for mild constipation.   QUEtiapine 25 MG tablet Commonly known as: SEROQUEL Take 0.5 tablets (12.5 mg total) by mouth at bedtime as needed (agitation).   REFRESH LIQUIGEL OP Place 1 drop into both eyes in the morning, at noon, in the evening, and at bedtime. Gel drops   simvastatin 20 MG tablet Commonly known as: ZOCOR Take 20 mg by mouth daily.   sodium chloride 1 g tablet Take 1 tablet (1 g total) by mouth 3 (three) times daily with meals.   traZODone 50 MG tablet Commonly known as: DESYREL Take 75 mg by mouth at bedtime.   vitamin C 1000 MG tablet Take 1,000 mg by mouth daily.        Follow-up Information     Sasser, Clarene Critchley, MD.  Schedule an appointment as soon as possible for a visit in 2 week(s).   Specialty: Family Medicine Why: for hospital follow-up Contact information: [address] [place] [address] [phone]         Bedelia Person, MD. Schedule an appointment as soon as possible for a visit in 2 week(s).   Specialty: Neurosurgery Why: for hospital follow-up Contact information: [address] [place] [address] [phone]                Discharge Exam: Ceasar Mons Weights   07/05/23 0500 07/06/23 0500 07/08/23 0500  Weight: 36.7 kg 36.6 kg 47 kg   S: No acute complaints  BP 139/72 (BP Location: Right Arm)   Pulse 77   Temp 98.1 F (36.7 C) (Oral)   Resp 18   Ht 4\' 11"  (1.499 m)   Wt 47 kg   SpO2 99%   BMI 20.93 kg/m   Physical Exam General: Alert and oriented,  NAD Cardiovascular: S1 S2 clear, RRR.  Respiratory: CTAB, no wheezing Gastrointestinal: Soft, nontender, nondistended, NBS Ext: no pedal edema  bilaterally Neuro: no new deficits Psych: Normal affect    Condition at discharge: fair  The results of significant diagnostics from this hospitalization (including imaging, microbiology, ancillary and laboratory) are listed below for reference.   Imaging Studies: CT HEAD WO CONTRAST ( ) Result Date: 07/04/2023 CLINICAL DATA:  Subdural hematoma EXAM: CT HEAD WITHOUT CONTRAST TECHNIQUE: Contiguous axial images were obtained from the base of the skull through the vertex without intravenous contrast. RADIATION DOSE REDUCTION: This exam was performed according to the departmental dose-optimization program which includes automated exposure control, adjustment of the mA and/or kV according to patient size and/or use of iterative reconstruction technique. COMPARISON:  Same day CT head. FINDINGS: Brain: Stable acute right subdural hemorrhage which measures 1.2 cm in thickness. Similar additional scattered subarachnoid hemorrhage bilaterally. Similar mass effect.  No evidence of acute large vascular territory infarct or mass lesion. No hydrocephalus. Vascular: No hyperdense vessel. Skull: No acute fracture. Sinuses/Orbits: Clear sinuses.  No acute orbital findings. Other: No mastoid effusions. IMPRESSION: Stable right subdural hematoma and scattered subarachnoid hemorrhage. Similar mass effect. Electronically Signed   By: Feliberto Harts M.D.   On: 07/04/2023 23:35   CT HEAD WO CONTRAST ( ) Result Date: 07/04/2023 CLINICAL DATA:  Subdural hematoma. EXAM: CT HEAD WITHOUT CONTRAST TECHNIQUE: Contiguous axial images were obtained from the base of the skull through the vertex without intravenous contrast. RADIATION DOSE REDUCTION: This exam was performed according to the departmental dose-optimization program which includes automated exposure control, adjustment of the mA and/or kV according to patient size and/or use of iterative reconstruction technique. COMPARISON:  CT head without contrast 07/04/2023 at 9:25 a.m. FINDINGS: Brain: The right subdural hematoma has significantly increased in size. Measured at the foramen of Monro, and hemorrhage previously measured 4 mm and now measures 9 mm. The largest coronal measurement is at the level of the posterior commissure with hemorrhage measures 12 mm. Previously measured 10 mm at this level. Areas of subarachnoid hemorrhage are present bilaterally 3 Increased mass effect is present with progressive effacement of the sulci over the right convexity. Partial effacement of the right lateral ventricle is present. No midline shift is present. No significant intraventricular hemorrhage is present. The brainstem and cerebellum are within normal limits. Midline structures are within normal limits. Without significant interval change Vascular: Atherosclerotic calcifications are present within the cavernous internal carotid arteries bilaterally. No hyperdense vessel is present. Skull: Calvarium is intact. No focal lytic or blastic  lesions are present. Right parietal scalp hematoma stable. Skin staples are in place. Sinuses/Orbits: Right mastoid effusion is present. The paranasal sinuses and mastoid air cells are otherwise clear. Bilateral lens replacements are noted. Globes and orbits are otherwise unremarkable. IMPRESSION: 1. Significant interval increase in size of right subdural hematoma. 2. Increased mass effect with progressive effacement of the sulci over the right convexity. 3. Partial effacement of the right lateral ventricle. 4. No midline shift. 5. Areas of subarachnoid hemorrhage bilaterally. Critical Value/emergent results were called by telephone at the time of interpretation on 07/04/2023 at 3:59 pm to provider [facility] , who verbally acknowledged these results. Electronically Signed   By: Marin Roberts M.D.   On: 07/04/2023 15:59   CT Head Wo Contrast Addendum Date: 07/04/2023 ADDENDUM REPORT: 07/04/2023 09:53 ADDENDUM: Study discussed by telephone with Dr. Meridee Score on 07/04/2023 at 0950 hours. Electronically Signed   By: Odessa Fleming M.D.   On: 07/04/2023 09:53   Result Date: 07/04/2023 CLINICAL DATA:  89 year old female status post fall on blood thinners.  Struck head with lacerations. EXAM: CT HEAD WITHOUT CONTRAST TECHNIQUE: Contiguous axial images were obtained from the base of the skull through the vertex without intravenous contrast. RADIATION DOSE REDUCTION: This exam was performed according to the departmental dose-optimization program which includes automated exposure control, adjustment of the mA and/or kV according to patient size and/or use of iterative reconstruction technique. COMPARISON:  Head CT 11/14/2020. FINDINGS: Brain: Broad-based and hyperdense right side subdural hematoma is mildly lobulated and maximal 9-10 mm in thickness, but generally 4 mm elsewhere. Superimposed small volume and scattered but bilateral anterior and superior convexity subarachnoid blood. No midline shift. No significant  mass effect on the ventricular system. Basilar cisterns are patent. No cortically based acute infarct identified. Stable gray-white matter differentiation throughout the brain. See additional presence or absence of intracranial hemorrhage in the table below. Vascular: Calcified atherosclerosis at the skull base. No suspicious intracranial vascular hyperdensity. Skull: No acute osseous abnormality identified. Sinuses/Orbits: Visualized paranasal sinuses and mastoids are stable and well aerated. Other: Large right posterior and smaller right anterior scalp hematomas. Soft tissue gas in both locations indicating associated laceration. Underlying calvarium appears stable and intact. Postoperative changes to both globes since 2022, orbits soft tissues otherwise negative. ---------------------------------------------------- Traumatic Brain Injury Risk Stratification Skull Fracture: No - Low/mBIG 1 Subdural Hematoma (SDH): 8mm plus - High/mBIG 3 Subarachnoid Hemorrhage [facility]): multifocal, bilateral - High/mBIG 3 Epidural Hematoma (EDH): No - Low/mBIG 1 Cerebral contusion, intra-axial, intraparenchymal Hemorrhage (IPH): No Intraventricular Hemorrhage (IVH): No - Low/mBIG 1 Midline Shift > 1mm or Edema/effacement of sulci/vents: No - Low/mBIG 1 ---------------------------------------------------- IMPRESSION: 1. Positive for acute posttraumatic intracranial hemorrhage: - Right side Subdural Hematoma, lobulated with maximal 9-10 mm in thickness, but generally 4 mm. - scattered bilateral SAH in a post trauma pattern. 2. No midline shift or significant intracranial mass effect. 3. No skull fracture.  Right side scalp hematomas and lacerations. Electronically Signed: By: Odessa Fleming M.D. On: 07/04/2023 09:47   CT Cervical Spine Wo Contrast Result Date: 07/04/2023 CLINICAL DATA:  89 year old female status post fall on blood thinners. Struck head with lacerations. EXAM: CT CERVICAL SPINE WITHOUT CONTRAST TECHNIQUE: Multidetector CT  imaging of the cervical spine was performed without intravenous contrast. Multiplanar CT image reconstructions were also generated. RADIATION DOSE REDUCTION: This exam was performed according to the departmental dose-optimization program which includes automated exposure control, adjustment of the mA and/or kV according to patient size and/or use of iterative reconstruction technique. COMPARISON:  Head CT today.  CTA head and neck 11/14/2020. FINDINGS: Alignment: Stable since 2022. Mild straightening of lordosis. Cervicothoracic junction alignment is within normal limits. Bilateral posterior element alignment is within normal limits. Skull base and vertebrae: Stable bone mineralization. Mineralization Visualized skull base is intact. No atlanto-occipital dissociation. C1 and C2 appear chronically degenerated but intact and aligned. No acute osseous abnormality identified in the cervical spine. Soft tissues and spinal canal: No prevertebral fluid or swelling. No visible canal hematoma. Calcified cervical carotid atherosclerosis. Thyroid is stable since 2022, negative for age. Disc levels: Chronic degenerative appearing ankylosis of C4-C5 and C5-C6. Adjacent segment cervical spine degeneration with advanced facet arthropathy including vacuum facet at C6-C7. But capacious CT appearance of the cervical spine at most levels. Upper chest: Partially visible T3 superior endplate compression fracture is new since 2022 but appears sclerotic and might be chronic. No retropulsion. T1 and T2 levels appear intact. Negative lung apices. IMPRESSION: 1. No acute traumatic injury identified in the cervical spine. 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 2. Partially visible T3 compression fracture is new since 2022 but appears sclerotic and could be chronic. No retropulsion. Electronically Signed   By: Odessa Fleming M.D.   On: 07/04/2023 09:53    Microbiology: Results for orders placed or performed  during the hospital encounter of 07/04/23  MRSA Next Gen by PCR, Nasal     Status: None   Collection Time: 07/04/23 12:23 PM   Specimen: Nasal Mucosa; Nasal Swab  Result Value Ref Range Status   MRSA by PCR Next Gen NOT DETECTED NOT DETECTED Final    Comment: (NOTE) The GeneXpert MRSA Assay (FDA approved for NASAL specimens only), is one component of a comprehensive MRSA colonization surveillance program. It is not intended to diagnose MRSA infection nor to guide or monitor treatment for MRSA infections. Test performance is not FDA approved in patients less than 76 years old. Performed at [facility], [address], [place], [place] [address]     Labs: CBC: Recent Labs  Lab 07/04/23 [phone] 07/05/23 0928 07/07/23 0455  WBC 3.9* 5.8 8.8  NEUTROABS 3.0  --   --   HGB 15.5* 11.2* 11.4*  HCT 46.0 33.1* 32.8*  MCV 94.7 94.0 91.9  PLT 234 183 183   Basic Metabolic Panel: Recent Labs  Lab 07/04/23 0951 07/05/23 0928 07/06/23 1130 07/07/23 0455 07/08/23 0653  NA 137 135 128* 127* 130*  K 4.4 3.3* 4.1 4.0 3.5  CL 98 102 97* 99 100  CO2 30 27 23 25 24   GLUCOSE 94 145* 102* 101* 89  BUN 14 18 11 8 11   CREATININE 0.72 0.81 0.59 0.63 0.74  CALCIUM 11.1* 9.8 10.3 9.3 10.0  MG  --  2.2  --   --   --    Liver Function Tests: No results for input(s): "AST", "ALT", "ALKPHOS", "BILITOT", "PROT", "ALBUMIN" in the last 168 hours. CBG: No results for input(s): "GLUCAP" in the last 168 hours.  Discharge time spent: greater than 30 minutes.  Signed: Thad Ranger, MD [facility] 07/08/2023

## 2023-07-09 ENCOUNTER — Encounter (HOSPITAL_COMMUNITY): Payer: Self-pay | Admitting: Physical Medicine and Rehabilitation

## 2023-07-09 ENCOUNTER — Other Ambulatory Visit: Payer: Self-pay

## 2023-07-09 DIAGNOSIS — S065X9S Traumatic subdural hemorrhage with loss of consciousness of unspecified duration, sequela: Secondary | ICD-10-CM

## 2023-07-09 LAB — COMPREHENSIVE METABOLIC PANEL
ALT: 111 U/L — ABNORMAL HIGH (ref 0–44)
AST: 79 U/L — ABNORMAL HIGH (ref 15–41)
Albumin: 2.8 g/dL — ABNORMAL LOW (ref 3.5–5.0)
Alkaline Phosphatase: 99 U/L (ref 38–126)
Anion gap: 6 (ref 5–15)
BUN: 9 mg/dL (ref 8–23)
CO2: 27 mmol/L (ref 22–32)
Calcium: 10.3 mg/dL (ref 8.9–10.3)
Chloride: 102 mmol/L (ref 98–111)
Creatinine, Ser: 0.63 mg/dL (ref 0.44–1.00)
GFR, Estimated: >60 mL/min (ref >60)
Glucose, Bld: 96 mg/dL (ref 70–99)
Potassium: 3.8 mmol/L (ref 3.5–5.1)
Sodium: 135 mmol/L (ref 135–145)
Total Bilirubin: 0.9 mg/dL (ref 0.0–1.2)
Total Protein: 6.1 g/dL — ABNORMAL LOW (ref 6.5–8.1)

## 2023-07-09 LAB — CBC WITH DIFFERENTIAL/PLATELET
Abs Immature Granulocytes: 0.02 10*3/uL (ref 0.00–0.07)
Basophils Absolute: 0 10*3/uL (ref 0.0–0.1)
Basophils Relative: 1 %
Eosinophils Absolute: 0.1 10*3/uL (ref 0.0–0.5)
Eosinophils Relative: 1 %
HCT: 33.6 % — ABNORMAL LOW (ref 36.0–46.0)
Hemoglobin: 12 g/dL (ref 12.0–15.0)
Immature Granulocytes: 0 %
Lymphocytes Relative: 12 %
Lymphs Abs: 0.7 10*3/uL (ref 0.7–4.0)
MCH: 32.3 pg (ref 26.0–34.0)
MCHC: 35.7 g/dL (ref 30.0–36.0)
MCV: 90.3 fL (ref 80.0–100.0)
Monocytes Absolute: 0.8 10*3/uL (ref 0.1–1.0)
Monocytes Relative: 14 %
Neutro Abs: 4.1 10*3/uL (ref 1.7–7.7)
Neutrophils Relative %: 72 %
Platelets: 253 10*3/uL (ref 150–400)
RBC: 3.72 MIL/uL — ABNORMAL LOW (ref 3.87–5.11)
RDW: 12 % (ref 11.5–15.5)
WBC: 5.7 10*3/uL (ref 4.0–10.5)
nRBC: 0 % (ref 0.0–0.2)

## 2023-07-09 MED ORDER — SODIUM CHLORIDE 1 G PO TABS
1.0000 g | ORAL_TABLET | Freq: Two times a day (BID) | ORAL | Status: DC
  Administered 2023-07-09 – 2023-07-15 (×12): 1 g via ORAL
  Filled 2023-07-09 (×12): qty 1

## 2023-07-09 MED ORDER — MELATONIN 5 MG PO TABS
5.0000 mg | ORAL_TABLET | Freq: Every evening | ORAL | Status: DC | PRN
  Administered 2023-07-12 – 2023-07-15 (×3): 5 mg via ORAL
  Filled 2023-07-09 (×4): qty 1

## 2023-07-09 MED ORDER — ACETAMINOPHEN 325 MG PO TABS
325.0000 mg | ORAL_TABLET | ORAL | Status: DC | PRN
  Administered 2023-07-10: 325 mg via ORAL
  Filled 2023-07-09: qty 1

## 2023-07-09 NOTE — Progress Notes (Addendum)
 PROGRESS NOTE   Subjective/Complaints:  No acute complaints.  No events overnight.  Patient does have some poor sleep, which she states is baseline, does not take anything at home.  Becomes somewhat tearful during exam when discussing fear about her dog, calms down quickly; patient's son states that this is her baseline.  No current pain, no questions or concerns regarding rehab.  ROS: Denies fevers, chills, N/V, abdominal pain, constipation, diarrhea, SOB, cough, chest pain, new weakness or paraesthesias.    Objective:   No results found. Recent Labs    07/07/23 0455 07/09/23 0608  WBC 8.8 5.7  HGB 11.4* 12.0  HCT 32.8* 33.6*  PLT 183 253   Recent Labs    07/08/23 0653 07/09/23 0608  NA 130* 135  K 3.5 3.8  CL 100 102  CO2 24 27  GLUCOSE 89 96  BUN 11 9  CREATININE 0.74 0.63  CALCIUM 10.0 10.3    Intake/Output Summary (Last 24 hours) at 07/09/2023 0850 Last data filed at 07/09/2023 0510 Gross per 24 hour  Intake --  Output 350 ml  Net -350 ml        Physical Exam: Vital Signs Blood pressure (!) 140/75, pulse 62, temperature 98.4 F (36.9 C), temperature source Oral, resp. rate 17, weight 39.1 kg, SpO2 99%. Constitutional: No apparent distress. Appropriate appearance for age.  HENT: No JVD. Neck Supple. Trachea midline. Atraumatic, normocephalic. Eyes: PERRLA. EOMI. Visual fields grossly intact.  Cardiovascular: RRR, no murmurs/rub/gallops. No Edema. Peripheral pulses 2+  Respiratory: CTAB. No rales, rhonchi, or wheezing. On RA.  Abdomen: + bowel sounds, normoactive. No distention or tenderness.  GU: Not examined.  Skin: C/D/I. No apparent lesions.  Small scalp laceration, clean dry, well-approximated.  Peripheral IV intact. MSK:      No apparent deformity.  Full passive range of motion all 4 extremities.       Neurologic exam:  Cognition: AAO to person, place, time and event.  + Mild memory deficit,  mild attention deficit Language: Fluent, No substitutions or neoglisms. No dysarthria. Names 3/3 objects correctly.  Insight: Fair to poor insight into current condition.  Mood: Extremely labile, pleasant and then crying Sensation: Equal and intact in BL UE and Les.  Reflexes: 2+ in BL UE and LEs.  Strength: Moving all 4 extremities antigravity against resistance, approximately equal. CN: 2-12 grossly intact.  Coordination: No apparent tremors. No ataxia on FTN, HTS bilaterally.  Spasticity: MAS 0 in all extremities.         Assessment/Plan: 1. Functional deficits which require 3+ hours per day of interdisciplinary therapy in a comprehensive inpatient rehab setting. Physiatrist is providing close team supervision and 24 hour management of active medical problems listed below. Physiatrist and rehab team continue to assess barriers to discharge/monitor patient progress toward functional and medical goals  Care Tool:  Bathing              Bathing assist       Upper Body Dressing/Undressing Upper body dressing        Upper body assist      Lower Body Dressing/Undressing Lower body dressing  Lower body assist       Toileting Toileting    Toileting assist       Transfers Chair/bed transfer  Transfers assist           Locomotion Ambulation   Ambulation assist              Walk 10 feet activity   Assist           Walk 50 feet activity   Assist           Walk 150 feet activity   Assist           Walk 10 feet on uneven surface  activity   Assist           Wheelchair     Assist               Wheelchair 50 feet with 2 turns activity    Assist            Wheelchair 150 feet activity     Assist          Blood pressure (!) 140/75, pulse 62, temperature 98.4 F (36.9 C), temperature source Oral, resp. rate 17, weight 39.1 kg, SpO2 99%. Medical Problem List and Plan: 1.  Functional deficits secondary to traumatic SDH with scalp laceration stapled in the ED as well as history Sjorgens syndrome/SLE             -patient may shower             -ELOS/Goals: 10-14 days S            -Stable to continue CIR  2.  Antithrombotics: -DVT/anticoagulation:  Mechanical: Antiembolism stockings, thigh (TED hose) Bilateral lower extremities             -antiplatelet therapy: N/A 3. Pain Management: Tylenol as needed  -No complaints of pain, headaches.  4. Mood/Behavior/Sleep: Trazodone 75 mg nightly.  Provide emotional support             -antipsychotic agents: Seroquel nightly as needed  -2-13: Add as needed melatonin. Add sleep log  5. Neuropsych/cognition: This patient is not capable of making decisions on her own behalf.  6. Skin/Wound Care: Routine skin checks  7. Fluids/Electrolytes/Nutrition: Routine in and outs with follow-up chemistries   - AM labs stable  8.  Seizure prophylaxis.  Continue 7-day course of Keppra.   9.  History of CVA.  Plavix currently on hold due to SDH.   10.  Hyperlipidemia.  Continue Zocor   11.  Hypertension.  Restart Norvasc 2.5mg  daily.    - monitor with therapies    07/09/2023    6:00 AM 07/09/2023    5:26 AM 07/08/2023    7:25 PM  Vitals with BMI  Weight 86 lbs 3 oz  93 lbs 10 oz  BMI 17.4  18.89  Systolic  140   Diastolic  75   Pulse  62     12.  Hyponatremia.  continue sodium chloride tablets.  Follow-up chemistries    - resolved on admission labs; reduce chloride tabs form TID to 1 g BID; recheck Monday  13.  History of membranous glomerulonephritis.  Patient not on immunosuppression followed by Dr. Clelia Schaumann   - BUN and CR stable  14. Transaminitis.   - reduce PRN tylenol dose to 325 mg   - recheck Monday  LOS: 1 days A FACE TO FACE EVALUATION WAS PERFORMED  Peggy Ramirez 07/09/2023, 8:50  AM

## 2023-07-09 NOTE — Discharge Summary (Signed)
 Physician Discharge Summary  Patient ID: Peggy Ramirez MRN: [id number] DOB/AGE: 1933-05-25 89 y.o.  Admit date: 07/08/2023 Discharge date: 07/25/2023  Discharge Diagnoses:  Principal Problem:   Traumatic subdural hematoma (HCC) Active Problems:   Absence of bladder continence   SIADH (syndrome of inappropriate ADH production) (HCC)   Urinary tract infection without hematuria Seizure prophylaxis History of CVA Hyperlipidemia Hypertension Hyponatremia/SIADH History of membranous glomerulonephritis Mood stabilization Sjorgens syndrome SLE UTI Hard of hearing  Discharged Condition: Stable  Significant Diagnostic Studies: CT HEAD WO CONTRAST ( ) Result Date: 07/22/2023 CLINICAL DATA:  89 year old female status post fall on blood thinners with right side subdural hematoma. EXAM: CT HEAD WITHOUT CONTRAST TECHNIQUE: Contiguous axial images were obtained from the base of the skull through the vertex without intravenous contrast. RADIATION DOSE REDUCTION: This exam was performed according to the departmental dose-optimization program which includes automated exposure control, adjustment of the mA and/or kV according to patient size and/or use of iterative reconstruction technique. COMPARISON:  Head CT 07/16/2023 and earlier. FINDINGS: Brain: Right side subdural hematoma has become more hypodense and more lobulated, asymmetric along the periphery of the right lateral hemisphere since 07/16/2023. Dominant right anterior convexity component is mostly hypodense and 14 mm now. Posterior and superior mildly biconvex mixed density component now is 12 mm. Versus on 07/16/2023 a more uniform 10-13 mm thick hematoma. Subsequently, mass effect on the anterior right lateral ventricle is increased. Leftward midline shift is increased and 3 mm. No ventriculomegaly. Basilar cisterns remain patent. Stable gray-white matter differentiation throughout the brain. No new area of intracranial hemorrhage. Vascular:  Calcified atherosclerosis at the skull base. No suspicious intracranial vascular hyperdensity. Skull: Stable, intact. Sinuses/Orbits: Visualized paranasal sinuses and mastoids are stable and well aerated. Other: Stable orbit and scalp soft tissues. IMPRESSION: 1. Right side Subdural Hematoma has become more lobulated and hypodense since 07/16/2023. Now maximal 14 mm thickness along the anterior frontal convexity. Subsequent increased mass effect on the right lateral ventricle and 3 mm leftward midline shift. Basilar cisterns remain patent. 2. No new intracranial abnormality. No skull fracture identified. Electronically Signed   By: Odessa Fleming M.D.   On: 07/22/2023 10:34   CT HEAD WO CONTRAST ( ) Result Date: 07/16/2023 CLINICAL DATA:  Headache, neuro deficit. Follow-up subdural hematoma. EXAM: CT HEAD WITHOUT CONTRAST TECHNIQUE: Contiguous axial images were obtained from the base of the skull through the vertex without intravenous contrast. RADIATION DOSE REDUCTION: This exam was performed according to the departmental dose-optimization program which includes automated exposure control, adjustment of the mA and/or kV according to patient size and/or use of iterative reconstruction technique. COMPARISON:  CT head without contrast 07/13/2023 and 07/04/2023. FINDINGS: Brain: The subdural hematoma over the right convexity has increased in size, measuring 13 mm maximally compared with 10.5 mm maximally on the prior study. The more hyperdense area is stable. Like scratched at the collection extends lateral to the temporal lobe more inferiorly. Partial effacement of the right lateral ventricle site scratched at slight mass effect on right lateral ventricle partial effacement the sulci has increased. Significant midline shift is present. Moderate generalized atrophy and white matter disease is otherwise stable. Remote infarct in the anterior left frontal lobe is stable. Deep brain nuclei are within normal limits. The  brainstem and cerebellum are within normal limits. Midline structures are within normal limits. Vascular: Atherosclerotic calcifications are present within the cavernous internal carotid arteries bilaterally. No hyperdense vessel is present. Skull: Calvarium is intact. No focal lytic or blastic lesions are present.  No significant extracranial soft tissue lesion is present. Sinuses/Orbits: The paranasal sinuses and mastoid air cells are clear. Bilateral lens replacements are noted. Globes and orbits are otherwise unremarkable. IMPRESSION: 1. Interval increase in size of subdural hematoma over the right convexity, measuring 13 mm maximally compared with 10.5 mm maximally on the prior study. 2. Increased mass effect on the right lateral ventricle and partial effacement of the sulci. 3. No significant midline shift. 4. Stable moderate generalized atrophy and white matter disease. This likely reflects the sequela of chronic microvascular ischemia. 5. Stable remote infarct in the anterior left frontal lobe. Critical Value/emergent results were called by telephone at the time of interpretation on 07/16/2023 at 4:11 pm to provider [facility] , who verbally acknowledged these results. Electronically Signed   By: Marin Roberts M.D.   On: 07/16/2023 16:12   DG CHEST PORT 1 VIEW Result Date: 07/16/2023 CLINICAL DATA:  Cough EXAM: PORTABLE CHEST 1 VIEW COMPARISON:  X-ray 07/14/2022 FINDINGS: Normal cardiopericardial silhouette tortuous aorta. Kyphotic x-ray obscures the apices. No pneumothorax, edema or consolidation. Slight blunting of left costophrenic angle. Tiny left effusion is possible. Prominent calcifications along the tracheobronchial tree. IMPRESSION: Kyphotic x-ray.  Question tiny left effusion. Electronically Signed   By: Karen Kays M.D.   On: 07/16/2023 13:42   CT HEAD WO CONTRAST ( ) Result Date: 07/13/2023 CLINICAL DATA:  Subdural hematoma with worsening neuro deficits. EXAM: CT HEAD WITHOUT  CONTRAST TECHNIQUE: Contiguous axial images were obtained from the base of the skull through the vertex without intravenous contrast. RADIATION DOSE REDUCTION: This exam was performed according to the departmental dose-optimization program which includes automated exposure control, adjustment of the mA and/or kV according to patient size and/or use of iterative reconstruction technique. COMPARISON:  CT head 07/04/2023. FINDINGS: Brain: Redemonstrated subdural hematoma over the right cerebral convexity which measures up to 1.1 cm in maximum thickness, previously 1.2 cm. Overall there is decreased attenuation of the blood products compatible with evolving acute to subacute hemorrhage. There is a significant decrease in blood products over the right temporal lobe compared to prior. Decreased scattered subarachnoid hemorrhage compared to 07/04/2023. Small focus of residual subarachnoid hemorrhage noted over the anterior left frontal lobe and right frontoparietal lobes. No new or enlarging intracranial hemorrhage. Similar mild mass effect and sulcal effacement in the right cerebral hemisphere. No significant midline shift. The basilar cisterns are patent. Ventricles: Prominence of the ventricles suggesting underlying parenchymal volume loss. Vascular: Atherosclerotic calcifications of the carotid siphons and intracranial vertebral arteries. No hyperdense vessel. Skull: No acute or aggressive finding. Orbits: Orbits are symmetric. Sinuses: Focal mucosal thickening in the posterior right maxillary sinus. Other: Trace fluid in the mastoid air cells. Skin staples in the right parietal scalp. IMPRESSION: Similar maximum size of right cerebral convexity subdural hematoma compared to 07/04/2023. Slightly decreased component over the right temporal lobe. Similar mass effect. No significant midline shift. Scattered subarachnoid hemorrhage is significantly decreased. Electronically Signed   By: Emily Filbert M.D.   On: 07/13/2023  13:39   CT HEAD WO CONTRAST ( ) Result Date: 07/04/2023 CLINICAL DATA:  Subdural hematoma EXAM: CT HEAD WITHOUT CONTRAST TECHNIQUE: Contiguous axial images were obtained from the base of the skull through the vertex without intravenous contrast. RADIATION DOSE REDUCTION: This exam was performed according to the departmental dose-optimization program which includes automated exposure control, adjustment of the mA and/or kV according to patient size and/or use of iterative reconstruction technique. COMPARISON:  Same day CT head. FINDINGS: Brain: Stable acute right  subdural hemorrhage which measures 1.2 cm in thickness. Similar additional scattered subarachnoid hemorrhage bilaterally. Similar mass effect. No evidence of acute large vascular territory infarct or mass lesion. No hydrocephalus. Vascular: No hyperdense vessel. Skull: No acute fracture. Sinuses/Orbits: Clear sinuses.  No acute orbital findings. Other: No mastoid effusions. IMPRESSION: Stable right subdural hematoma and scattered subarachnoid hemorrhage. Similar mass effect. Electronically Signed   By: Feliberto Harts M.D.   On: 07/04/2023 23:35   CT HEAD WO CONTRAST ( ) Result Date: 07/04/2023 CLINICAL DATA:  Subdural hematoma. EXAM: CT HEAD WITHOUT CONTRAST TECHNIQUE: Contiguous axial images were obtained from the base of the skull through the vertex without intravenous contrast. RADIATION DOSE REDUCTION: This exam was performed according to the departmental dose-optimization program which includes automated exposure control, adjustment of the mA and/or kV according to patient size and/or use of iterative reconstruction technique. COMPARISON:  CT head without contrast 07/04/2023 at 9:25 a.m. FINDINGS: Brain: The right subdural hematoma has significantly increased in size. Measured at the foramen of Monro, and hemorrhage previously measured 4 mm and now measures 9 mm. The largest coronal measurement is at the level of the posterior commissure with  hemorrhage measures 12 mm. Previously measured 10 mm at this level. Areas of subarachnoid hemorrhage are present bilaterally 3 Increased mass effect is present with progressive effacement of the sulci over the right convexity. Partial effacement of the right lateral ventricle is present. No midline shift is present. No significant intraventricular hemorrhage is present. The brainstem and cerebellum are within normal limits. Midline structures are within normal limits. Without significant interval change Vascular: Atherosclerotic calcifications are present within the cavernous internal carotid arteries bilaterally. No hyperdense vessel is present. Skull: Calvarium is intact. No focal lytic or blastic lesions are present. Right parietal scalp hematoma stable. Skin staples are in place. Sinuses/Orbits: Right mastoid effusion is present. The paranasal sinuses and mastoid air cells are otherwise clear. Bilateral lens replacements are noted. Globes and orbits are otherwise unremarkable. IMPRESSION: 1. Significant interval increase in size of right subdural hematoma. 2. Increased mass effect with progressive effacement of the sulci over the right convexity. 3. Partial effacement of the right lateral ventricle. 4. No midline shift. 5. Areas of subarachnoid hemorrhage bilaterally. Critical Value/emergent results were called by telephone at the time of interpretation on 07/04/2023 at 3:59 pm to provider [facility] , who verbally acknowledged these results. Electronically Signed   By: Marin Roberts M.D.   On: 07/04/2023 15:59   CT Head Wo Contrast Addendum Date: 07/04/2023 ADDENDUM REPORT: 07/04/2023 09:53 ADDENDUM: Study discussed by telephone with Dr. Meridee Score on 07/04/2023 at 0950 hours. Electronically Signed   By: Odessa Fleming M.D.   On: 07/04/2023 09:53   Result Date: 07/04/2023 CLINICAL DATA:  89 year old female status post fall on blood thinners. Struck head with lacerations. EXAM: CT HEAD WITHOUT CONTRAST  TECHNIQUE: Contiguous axial images were obtained from the base of the skull through the vertex without intravenous contrast. RADIATION DOSE REDUCTION: This exam was performed according to the departmental dose-optimization program which includes automated exposure control, adjustment of the mA and/or kV according to patient size and/or use of iterative reconstruction technique. COMPARISON:  Head CT 11/14/2020. FINDINGS: Brain: Broad-based and hyperdense right side subdural hematoma is mildly lobulated and maximal 9-10 mm in thickness, but generally 4 mm elsewhere. Superimposed small volume and scattered but bilateral anterior and superior convexity subarachnoid blood. No midline shift. No significant mass effect on the ventricular system. Basilar cisterns are patent. No cortically based  acute infarct identified. Stable gray-white matter differentiation throughout the brain. See additional presence or absence of intracranial hemorrhage in the table below. Vascular: Calcified atherosclerosis at the skull base. No suspicious intracranial vascular hyperdensity. Skull: No acute osseous abnormality identified. Sinuses/Orbits: Visualized paranasal sinuses and mastoids are stable and well aerated. Other: Large right posterior and smaller right anterior scalp hematomas. Soft tissue gas in both locations indicating associated laceration. Underlying calvarium appears stable and intact. Postoperative changes to both globes since 2022, orbits soft tissues otherwise negative. ---------------------------------------------------- Traumatic Brain Injury Risk Stratification Skull Fracture: No - Low/mBIG 1 Subdural Hematoma (SDH): 8mm plus - High/mBIG 3 Subarachnoid Hemorrhage [facility]): multifocal, bilateral - High/mBIG 3 Epidural Hematoma (EDH): No - Low/mBIG 1 Cerebral contusion, intra-axial, intraparenchymal Hemorrhage (IPH): No Intraventricular Hemorrhage (IVH): No - Low/mBIG 1 Midline Shift > 1mm or Edema/effacement of sulci/vents:  No - Low/mBIG 1 ---------------------------------------------------- IMPRESSION: 1. Positive for acute posttraumatic intracranial hemorrhage: - Right side Subdural Hematoma, lobulated with maximal 9-10 mm in thickness, but generally 4 mm. - scattered bilateral SAH in a post trauma pattern. 2. No midline shift or significant intracranial mass effect. 3. No skull fracture.  Right side scalp hematomas and lacerations. Electronically Signed: By: Odessa Fleming M.D. On: 07/04/2023 09:47   CT Cervical Spine Wo Contrast Result Date: 07/04/2023 CLINICAL DATA:  89 year old female status post fall on blood thinners. Struck head with lacerations. EXAM: CT CERVICAL SPINE WITHOUT CONTRAST TECHNIQUE: Multidetector CT imaging of the cervical spine was performed without intravenous contrast. Multiplanar CT image reconstructions were also generated. RADIATION DOSE REDUCTION: This exam was performed according to the departmental dose-optimization program which includes automated exposure control, adjustment of the mA and/or kV according to patient size and/or use of iterative reconstruction technique. COMPARISON:  Head CT today.  CTA head and neck 11/14/2020. FINDINGS: Alignment: Stable since 2022. Mild straightening of lordosis. Cervicothoracic junction alignment is within normal limits. Bilateral posterior element alignment is within normal limits. Skull base and vertebrae: Stable bone mineralization. Mineralization Visualized skull base is intact. No atlanto-occipital dissociation. C1 and C2 appear chronically degenerated but intact and aligned. No acute osseous abnormality identified in the cervical spine. Soft tissues and spinal canal: No prevertebral fluid or swelling. No visible canal hematoma. Calcified cervical carotid atherosclerosis. Thyroid is stable since 2022, negative for age. Disc levels: Chronic degenerative appearing ankylosis of C4-C5 and C5-C6. Adjacent segment cervical spine degeneration with advanced facet  arthropathy including vacuum facet at C6-C7. But capacious CT appearance of the cervical spine at most levels. Upper chest: Partially visible T3 superior endplate compression fracture is new since 2022 but appears sclerotic and might be chronic. No retropulsion. T1 and T2 levels appear intact. Negative lung apices. IMPRESSION: 1. No acute traumatic injury identified in the cervical spine. 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 2. Partially visible T3 compression fracture is new since 2022 but appears sclerotic and could be chronic. No retropulsion. Electronically Signed   By: Odessa Fleming M.D.   On: 07/04/2023 09:53    Labs:  Basic Metabolic Panel: Recent Labs  Lab 07/17/23 0454 07/17/23 1546 07/18/23 0538 07/19/23 0920 07/20/23 0606 07/21/23 0516 07/23/23 0616 07/23/23 1803  NA 130* 128* 133* 130* 132* 133* 128* 130*  K 3.7 3.4* 3.3* 3.5 3.4* 3.2* 4.7 4.2  CL 98 97* 104 97* 98 100 93* 94*  CO2 23 25 23 25 25 24 24 25   GLUCOSE 91 114* 89 102* 90 94 99 101*  BUN 11 15 11  10  8 8 13 14   CREATININE 0.78 0.81 0.62 0.54 0.52 0.59 0.68 0.66  CALCIUM 10.0 9.5 9.9 10.1 10.0 10.0 10.2 10.7*    CBC: Recent Labs  Lab 07/20/23 0606 07/22/23 0943 07/23/23 0616  WBC 6.8 6.6 6.5  NEUTROABS  --  5.3 4.9  HGB 12.7 12.0 12.3  HCT 36.5 35.1* 35.3*  MCV 89.9 91.2 90.5  PLT 406* 419* 406*    CBG: No results for input(s): "GLUCAP" in the last 168 hours.  Family history.  Denies any colon cancer esophageal cancer or rectal cancer  Brief HPI:   Shavell S Capps is a 89 y.o. right-handed female with history significant for' Sjorgens syndrome, SLE, hypertension, 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 Clelia Schaumann, history of CVA on Plavix, anxiety.  Per chart review lives alone.  Reportedly independent prior to admission but recently started using a walker.  She does not drive.  Presented 07/04/2023 after reported fall without loss of  consciousness.  She was initially seen at [facility] and a CT of the head demonstrated a right side subdural hemorrhage measuring 9-10 mm without midline shift.  Patient did sustain a scalp laceration stapled in the ED.  CT cervical spine did not demonstrate any traumatic injury.  Admission chemistries unremarkable.  She was transferred to [facility] received neurosurgical follow-up consultation by Dr. Hoyt Koch with conservative care.  Placed on Keppra x 7 days for seizure prophylaxis.  Latest follow-up CT imaging showed stable right subdural hematoma.  She had bouts of hypotension early on in her hospital stay requiring fluid boluses.  Hyponatremia 127-130 received DDAVP.  Patient with intermittent bouts of agitation and restlessness placed on Seroquel.  Her Plavix for history of CVA remained on hold due to SDH.  Therapy evaluations completed due to patient decreased functional mobility was admitted for a comprehensive rehab program.   Hospital Course: Keirra S Dewald was admitted to rehab 07/08/2023 for inpatient therapies to consist of PT, ST and OT at least three hours five days a week. Past admission physiatrist, therapy team and rehab RN have worked together to provide customized collaborative inpatient rehab.  Pertaining to patient's traumatic SDH with scalp laceration stapled the ED remained stable followed by neurosurgery Dr. Hoyt Koch with conservative care..  Latest follow-up cranial CT scan and imaging 07/21/2023 showed right side subdural hematoma had become more lobulated and hypodense since 07/16/2023 with maximal 14 mm thickness along the anterior frontal convexity.  Subsequent increased mass effect on the right lateral ventricle and 3 mm leftward midline shift.  Basilar cisterns remain patent.  CT results discussed with neurosurgery Dr. Hoyt Koch and plan was for follow-up outpatient to discuss possible need for bur hole versus embolization.  Patient with  intermittent bouts of headaches maintained initially on Fioricet as needed as well as scheduled Topamax and has been weaned to off.  She had been on Plavix prior to admission for history of CVA on hold due to SDH.  She was completing a course of Keppra for seizure prophylaxis no seizure activity.  Blood pressure controlled with Norvasc.  Intermittent agitation maintained on low-dose Xanax as needed as well as she had been on Seroquel as needed but has since been discontinued and placed on low-dose BuSpar.  Zocor ongoing for hyperlipidemia.  Hyponatremia most likely due to SDH and did receive a short course of IV fluids normal saline at 75 cc an hour and maintained on sodium chloride tablets  and monitored with latest  sodium level 133-130-127-131.  Her trazodone was discontinued due to hyponatremia as well as her Topamax and losartan.  Urine study consistent with SIADH history of membranous glomerulonephritis not on immunosuppression followed by Dr. Clelia Schaumann.  Urinalysis study 07/14/2023 positive nitrite with moderate leukocytes with urine culture showing greater 100,000 Pseudomonas/Enterococcus and completed course of Cipro as well as amoxicillin   Blood pressures were monitored on TID basis and remained soft and monitored    Rehab course: During patient's stay in rehab weekly team conferences were held to monitor patient's progress, set goals and discuss barriers to discharge. At admission, patient required min mod assist 6 feet rolling walker min assist stand pivot transfers  Physical exam.  Blood pressure 153/64 pulse 68 temperature 99 respirations 18 oxygen saturation 98% room air Constitutional.  No acute distress HEENT Head.  Scalp laceration clean and dry Eyes.  Pupils round and reactive to light no discharge without nystagmus Neck.  Supple nontender no JVD without thyromegaly Cardiac regular rate and rhythm without any extra sounds or murmur heard Abdomen.  Soft nontender positive bowel  sounds without rebound Respiratory effort normal no respiratory distress without wheeze Neurologic.  Patient was a bit sedated but arousable.  Made eye contact with examiner follows simple commands.  Needed multiple cues for year and date.  He/She  has had improvement in activity tolerance, balance, postural control as well as ability to compensate for deficits. He/She has had improvement in functional use RUE/LUE  and RLE/LLE as well as improvement in awareness.  Working with energy conservation techniques.  175 feet without assistive device to challenge balance.  Relates to the sink with standby assist.  She does need some cues for upright posture.  Functional mobility to the bathroom with minimal assist and shuffling gait.  She does need some assist for lower body ADLs.  She benefited from moderate assist for sequencing and problem-solving during personal hygiene at sink.  She did have some perseveration on getting dressed.  Full family teaching completed recommendations were for supervision contact-guard at home for safety.  Family teaching completed and discharged to home.       Disposition:  There are no questions and answers to display.         Diet: Regular  Special Instructions: No driving smoking or alcohol  Continue to hold Plavix until further notice due to SDH  Medications at discharge. 1.  Tylenol as needed 2.  Norvasc 2.5 mg daily 3.  Vitamin C 1000 mg p.o. daily 4.  Colace 100 mg p.o. twice daily and hold for loose stools 5.  Claritin 10 mg daily as needed allergies 6.  Multivitamin daily 7.  Buspar 5 mg BID 8.  Xanax 0.25 mg BID as needed anxiety 9.  Zocor 20 mg p.o. daily 10.  Melatonin 5 mg nightly prn 11.  Sodium chloride tablets 1 g two times daily with meals 12.  Refresh liquid gel 1 drop both eyes in the morning and noon and in the evening at bedtime 13.  Florastor 250 mg BID    30-35 minutes were spent completing discharge summary and discharge  planning  Discharge Instructions     Ambulatory referral to Physical Medicine Rehab   Complete by: As directed    Moderate complexity follow-up 1 to 2 weeks traumatic SDH        Follow-up Information     Elijah Birk C, DO Follow up.   Specialty: Physical Medicine and Rehabilitation Why: Office to call for appointment Contact  information: [address] [place] [address] [phone]         Bedelia Person, MD Follow up.   Specialty: Neurosurgery Why: Call for appointment to discuss possible embolization Contact information: [address] [place] [address] [phone]         [place] Har[place], MD Follow up.   Why: Call for appointment Contact information: [address] [place] [address] [place] [address] [phone]                 Signed: Charlton Amor 07/24/2023, 5:10 AM

## 2023-07-09 NOTE — Evaluation (Signed)
 Occupational Therapy Assessment and Plan  Patient Details  Name: Peggy Ramirez MRN: [id number] Date of Birth: Mar 05, 1934  OT Diagnosis: cognitive deficits and muscle weakness (generalized) Rehab Potential: Rehab Potential (ACUTE ONLY): Good ELOS: ~10-14 days   Today's Date: 07/09/2023 OT Individual Time: 9147-8295 OT Individual Time Calculation (min): 70 min     Hospital Problem: Principal Problem:   Traumatic subdural hematoma (HCC)   Past Medical History:  Past Medical History:  Diagnosis Date   Acute metabolic encephalopathy    Chronic constipation    Chronic left lower quadrant pain    GERD (gastroesophageal reflux disease)    Headache(784.0)    High cholesterol    Hydrosalpinx    LEFT   Hypertension    Lupus    Membranous glomerulonephritis    followed by Dr. Clelia Schaumann   Ovarian fibroma    RIGHT   Pelvic adhesions    Sjogren's syndrome (HCC)    Stroke [facility])    Past Surgical History:  Past Surgical History:  Procedure Laterality Date   APPENDECTOMY  04/28/2000   BIOPSY  12/17/2020   Procedure: BIOPSY;  Surgeon: Lanelle Bal, DO;  Location: [facility];  Service: Endoscopy;;   COLONOSCOPY WITH PROPOFOL N/A 12/17/2020   Procedure: COLONOSCOPY WITH PROPOFOL;  Surgeon: Lanelle Bal, DO;  Location: [facility];  Service: Endoscopy;  Laterality: N/A;   FRACTURE SURGERY     HIP PINNING,CANNULATED Left 12/15/2019   Procedure: CANNULATED HIP PINNING;  Surgeon: Roby Lofts, MD;  Location: [facility] OR;  Service: Orthopedics;  Laterality: Left;   LAPAROSCOPIC CHOLECYSTECTOMY  04/28/2000   TONSILLECTOMY  04/29/1939   TOTAL ABDOMINAL HYSTERECTOMY W/ BILATERAL SALPINGOOPHORECTOMY  04/29/2003   VAGINAL HYSTERECTOMY  04/29/1979    Assessment & Plan Clinical Impression: Patient is a 89 y.o. 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 Clelia Schaumann, history of CVA on Plavix,Anxiety. Per chart review patient lives alone. 1 level home one-step to entry. Reportedly independent prior to admission but recently started using a walker. She does not drive. Family takes her grocery shopping. Presented 07/04/2023 after reported fall without loss of consciousness. She was initially seen at [facility] and a CT of the head demonstrated a right sided subdural hemorrhage measuring 9-10 mm without midline shift. Patient did sustain a scalp laceration stapled in the ED. CT cervical spine did not demonstrate any traumatic injury. Admission chemistries were unremarkable. She was transferred to [facility] and receive neurosurgical follow-up consultation Dr. Hoyt Koch with conservative care. Placed on Keppra x 7 days for seizure prophylaxis. Latest follow-up CT imaging showed stable right subdural hematoma. She has had bouts of hypotension early on in her hospital stay requiring fluid boluses. Hyponatremia 127-130 received DDAVP. Patient with intermittent bouts of agitation or restlessness placed on Seroquel. Her Plavix for history of CVA remains on hold due to SDH.  Patient transferred to CIR on 88/03/2024 .    Patient currently requires mod with basic self-care skills and min A for basic mobility without a AD  secondary to muscle weakness, decreased cardiorespiratoy endurance, impaired timing and sequencing and decreased coordination, spatial awareness, decreased attention, decreased awareness, decreased problem solving, decreased safety awareness, decreased memory, delayed processing, and demonstrates behaviors consistent with Rancho Level V, and decreased sitting balance, decreased standing balance, decreased postural control, and decreased balance strategies.  Prior to hospitalization, patient could complete ADL with modified independent .  Patient will benefit from skilled intervention to decrease level of assist with basic self-care  skills and increase independence with basic self-care skills prior to discharge home with care partner.  Anticipate patient will require 24 hour supervision and follow up home health.  OT - End of Session Activity Tolerance: Tolerates 30+ min activity with multiple rests Endurance Deficit: Yes OT Assessment Rehab Potential (ACUTE ONLY): Good OT Patient demonstrates impairments in the following area(s): Balance;Cognition;Edema;Endurance;Motor;Pain;Safety;Sensory OT Basic ADL's Functional Problem(s): Grooming;Bathing;Dressing;Toileting OT Transfers Functional Problem(s): Toilet;Tub/Shower OT Additional Impairment(s): Fuctional Use of Upper Extremity OT Plan OT Intensity: Minimum of 1-2 x/day, 45 to 90 minutes OT Frequency: 5 out of 7 days OT Duration/Estimated Length of Stay: ~10-14 days OT Treatment/Interventions: Balance/vestibular training;Discharge planning;Pain management;Self Care/advanced ADL retraining;Therapeutic Activities;UE/LE Coordination activities;Cognitive remediation/compensation;Disease mangement/prevention;Functional mobility training;Patient/family education;Skin care/wound managment;Therapeutic Exercise;Community reintegration;DME/adaptive equipment instruction;Neuromuscular re-education;Psychosocial support;Splinting/orthotics;UE/LE Strength taining/ROM OT Self Feeding Anticipated Outcome(s): n/a OT Basic Self-Care Anticipated Outcome(s): supervision OT Toileting Anticipated Outcome(s): supervision OT Bathroom Transfers Anticipated Outcome(s): supervision OT Recommendation Recommendations for Other Services: Neuropsych consult Patient destination: Home Follow Up Recommendations: Home health OT;24 hour supervision/assistance Equipment Recommended: To be determined   OT Evaluation Precautions/Restrictions  Precautions Precautions: None Restrictions Weight Bearing Restrictions Per Provider Order: No General Chart Reviewed: Yes Family/Caregiver Present: No     Pain  No c/o pain in session  Home Living/Prior Functioning Home Living Family/patient expects to be discharged to:: Inpatient rehab Living Arrangements: Alone Available Help at Discharge: Family, Available PRN/intermittently Type of Home: House Home Access: Stairs to enter [employer] of Steps: 1 to porch and then threahold Entrance Stairs-Rails: None Home Layout: One level Bathroom Shower/Tub: Health visitor: Handicapped height  Lives With: Alone Prior Function Level of Independence: Independent with basic ADLs, Independent with transfers, Independent with gait  Able to Take Stairs?: Yes Driving: No Vision Baseline Vision/History: 1 Wears glasses Ability to See in Adequate Light: 0 Adequate Patient Visual Report: No change from baseline Vision Assessment?: No apparent visual deficits Perception  Perception: Impaired Praxis Praxis: Impaired Praxis Impairment Details: Perseveration Cognition Cognition Overall Cognitive Status: Impaired/Different from baseline Arousal/Alertness: Awake/alert Orientation Level: Person;Situation;Place Person: Oriented Place: Disoriented Situation: Disoriented Memory: Impaired Memory Impairment: Decreased recall of new information;Decreased short term memory;Retrieval deficit Decreased Short Term Memory: Functional basic Attention: Focused;Sustained Focused Attention: Appears intact Sustained Attention: Impaired Sustained Attention Impairment: Functional basic Awareness: Impaired Awareness Impairment: Intellectual impairment Problem Solving: Impaired Problem Solving Impairment: Functional basic Behaviors: Impulsive;Verbal agitation;Perseveration;Poor frustration tolerance;Confabulation Safety/Judgment: Impaired Rancho Mirant Scales of Cognitive Functioning: Confused, Inappropriate Non-Agitated: Maximal Assistance Brief Interview for Mental Status (BIMS) Repetition of Three Words (First Attempt):  3 Temporal Orientation: Year: Missed by more than 5 years Temporal Orientation: Month: Accurate within 5 days Temporal Orientation: Day: Correct Recall: "Sock": No, could not recall Recall: "Blue": No, could not recall Recall: "Bed": No, could not recall BIMS Summary Score: 6 Sensation Sensation Light Touch: Appears Intact Proprioception: Appears Intact Coordination Gross Motor Movements are Fluid and Coordinated: No Fine Motor Movements are Fluid and Coordinated: No Coordination and Movement Description: Generalized weakness Motor  Motor Motor: Within Functional Limits Motor - Skilled Clinical Observations: generalized weakness and decr balance  Trunk/Postural Assessment  Cervical Assessment Cervical Assessment: Exceptions to [facility] (forward head) Thoracic Assessment Thoracic Assessment: Exceptions to [facility] (rounded shoulders) Lumbar Assessment Lumbar Assessment:  (posterior pelvic tilt) Postural Control Postural Control: Deficits on evaluation Righting Reactions: delayed Protective Responses: delayed  Balance Balance Balance Assessed: Yes Static Sitting Balance Static Sitting - Balance  Support: No upper extremity supported Static Sitting - Level of Assistance: 5: Stand by assistance Dynamic Sitting Balance Dynamic Sitting - Balance Support: During functional activity Dynamic Sitting - Level of Assistance: 5: Stand by assistance Static Standing Balance Static Standing - Balance Support: During functional activity Static Standing - Level of Assistance: 4: Min assist Extremity/Trunk Assessment RUE Assessment RUE Assessment: Within Functional Limits LUE Assessment LUE Assessment: Within Functional Limits  Care Tool Care Tool Self Care Eating   Eating Assist Level: Set up assist    Oral Care    Oral Care Assist Level: Set up assist    Bathing   Body parts bathed by patient: Right arm;Left arm;Left upper leg;Right lower leg;Chest;Abdomen;Front perineal  area;Buttocks;Right upper leg;Left lower leg;Face     Assist Level: Minimal Assistance - Patient > 75%    Upper Body Dressing(including orthotics)   What is the patient wearing?: Bra;Pull over shirt   Assist Level: Minimal Assistance - Patient > 75%    Lower Body Dressing (excluding footwear)   What is the patient wearing?: Pants;Incontinence brief Assist for lower body dressing: Minimal Assistance - Patient > 75%    Putting on/Taking off footwear   What is the patient wearing?: Socks;Shoes Assist for footwear: Minimal Assistance - Patient > 75%       Care Tool Toileting Toileting activity   Assist for toileting: Total Assistance - Patient < 25%     Care Tool Bed Mobility Roll left and right activity   Roll left and right assist level: Minimal Assistance - Patient > 75%    Sit to lying activity   Sit to lying assist level: Minimal Assistance - Patient > 75%    Lying to sitting on side of bed activity   Lying to sitting on side of bed assist level: the ability to move from lying on the back to sitting on the side of the bed with no back support.: Minimal Assistance - Patient > 75%     Care Tool Transfers Sit to stand transfer   Sit to stand assist level: Minimal Assistance - Patient > 75%    Chair/bed transfer   Chair/bed transfer assist level: Minimal Assistance - Patient > 75%     Toilet transfer   Assist Level: Minimal Assistance - Patient > 75%     Care Tool Cognition  Expression of Ideas and Wants Expression of Ideas and Wants: 3. Some difficulty - exhibits some difficulty with expressing needs and ideas (e.g, some words or finishing thoughts) or speech is not clear  Understanding Verbal and Non-Verbal Content Understanding Verbal and Non-Verbal Content: 2. Sometimes understands - understands only basic conversations or simple, direct phrases. Frequently requires cues to understand   Memory/Recall Ability     Refer to Care Plan for Long Term Goals  SHORT  TERM GOAL WEEK 1 OT Short Term Goal 1 (Week 1): Pt will perform toileting with contact guard -performing 3/3 steps OT Short Term Goal 2 (Week 1): Pt will shower sit to stand with contact guard with min cues OT Short Term Goal 3 (Week 1): Pt will pick out clothing with min cues OT Short Term Goal 4 (Week 1): Pt will transfer to toilet with LRAD with supervision OT Short Term Goal 5 (Week 1): Pt will be oriented x3 50% of the day with mod cues  Recommendations for other services: Neuropsych   Skilled Therapeutic Intervention Ot eval initiated with  OT purpose, role and goals discussed. Pt received in the bed and  reported she needed to go to the bathroom when arrived in the room. Pt able to come to EOB with minA  and then ambulated to the bathroom with min A; pt holding very tightly onto the therapist. Pt voided on the toilet continently  and then transitioned into the shower. PT required max cues throughout session to orient her to place and situation and to remain on task. Mod to max A for functional problem solving. Pt did demonstrate decr spatial awareness with dressing and drawing clock test. Pt did demonstrate perseveration in conversation (about her son and her dog) and with washing herself.  Pt ambulated around the unit with min A without AD orienting to place/unit. Pt left sitting up in w/c with nurse.   ADL ADL Eating: Minimal assistance Grooming: Minimal assistance Upper Body Bathing: Minimal assistance Where Assessed-Upper Body Bathing: Shower Lower Body Bathing: Minimal assistance Where Assessed-Lower Body Bathing: Shower Upper Body Dressing: Minimal assistance Lower Body Dressing: Minimal assistance Where Assessed-Lower Body Dressing: Edge of bed Toileting: Moderate assistance Where Assessed-Toileting: Teacher, adult education: Curator Method: Marine scientist Method: Stand pivot Mobility   Bed Mobility Bed Mobility: Sit to Supine;Supine to Sit Supine to Sit: Minimal Assistance - Patient > 75% Sit to Supine: Minimal Assistance - Patient > 75% Transfers Sit to Stand: Minimal Assistance - Patient > 75% Stand to Sit: Minimal Assistance - Patient > 75%   Discharge Criteria: Patient will be discharged from OT if patient refuses treatment 3 consecutive times without medical reason, if treatment goals not met, if there is a change in medical status, if patient makes no progress towards goals or if patient is discharged from hospital.  The above assessment, treatment plan, treatment alternatives and goals were discussed and mutually agreed upon: by patient  Adan Sis 07/09/2023, 3:31 PM

## 2023-07-09 NOTE — Evaluation (Signed)
 Physical Therapy Assessment and Plan  Patient Details  Name: Peggy Ramirez MRN: [id number] Date of Birth: December 23, 1933  PT Diagnosis: Cognitive deficits and Muscle weakness Rehab Potential: Good ELOS: 10-14 Days   Today's Date: 07/09/2023 PT Individual Time: 4098-1191 PT Individual Time Calculation (min): 58 min    Hospital Problem: Principal Problem:   Traumatic subdural hematoma (HCC)   Past Medical History:  Past Medical History:  Diagnosis Date   Acute metabolic encephalopathy    Chronic constipation    Chronic left lower quadrant pain    GERD (gastroesophageal reflux disease)    Headache(784.0)    High cholesterol    Hydrosalpinx    LEFT   Hypertension    Lupus    Membranous glomerulonephritis    followed by Dr. Clelia Schaumann   Ovarian fibroma    RIGHT   Pelvic adhesions    Sjogren's syndrome (HCC)    Stroke [facility])    Past Surgical History:  Past Surgical History:  Procedure Laterality Date   APPENDECTOMY  04/28/2000   BIOPSY  12/17/2020   Procedure: BIOPSY;  Surgeon: Lanelle Bal, DO;  [facility];  Service: Endoscopy;;   COLONOSCOPY WITH PROPOFOL N/A 12/17/2020   Procedure: COLONOSCOPY WITH PROPOFOL;  Surgeon: Lanelle Bal, DO;  [facility];  Service: Endoscopy;  Laterality: N/A;   FRACTURE SURGERY     HIP PINNING,CANNULATED Left 12/15/2019   Procedure: CANNULATED HIP PINNING;  Surgeon: Roby Lofts, MD;  Location: [facility] OR;  Service: Orthopedics;  Laterality: Left;   LAPAROSCOPIC CHOLECYSTECTOMY  04/28/2000   TONSILLECTOMY  04/29/1939   TOTAL ABDOMINAL HYSTERECTOMY W/ BILATERAL SALPINGOOPHORECTOMY  04/29/2003   VAGINAL HYSTERECTOMY  04/29/1979    Assessment & Plan Clinical Impression: Patient is an 89 year old right-handed female with history significant for Sjorgens syndrome, SLE, hypertension, 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 Clelia Schaumann, history of CVA on Plavix,Anxiety.  Per chart  review patient lives alone.  1 level home one-step to entry.  Reportedly independent prior to admission but recently started using a walker.  She does not drive.  Family takes her grocery shopping.  Presented 07/04/2023 after reported fall without loss of consciousness.  She was initially seen at [facility] and a CT of the head demonstrated a right sided subdural hemorrhage measuring 9-10 mm without midline shift.  Patient did sustain a scalp laceration stapled in the ED.  CT cervical spine did not demonstrate any traumatic injury.  Admission chemistries were unremarkable.  She was transferred to [facility] and receive neurosurgical follow-up consultation Dr. Hoyt Koch with conservative care.  Placed on Keppra x 7 days for seizure prophylaxis.  Latest follow-up CT imaging showed stable right subdural hematoma.  She has had bouts of hypotension early on in her hospital stay requiring fluid boluses.  Hyponatremia 127-130 received DDAVP.  Patient with intermittent bouts of agitation or restlessness placed on Seroquel.  Her Plavix for history of CVA remains on hold due to SDH.  Therapy evaluations completed due to patient decreased functional mobility was admitted for a comprehensive rehab program. Son at bedside notes that patient is more alert today.   Patient transferred to CIR on 07/08/2023 .   Patient currently requires min with mobility secondary to muscle weakness, decreased cardiorespiratoy endurance, decreased attention, decreased awareness, decreased problem solving, decreased safety awareness, decreased memory, delayed processing, and demonstrates behaviors consistent with Rancho Level V, and decreased sitting balance, decreased standing balance, decreased postural control, and decreased  balance strategies.  Prior to hospitalization, patient was modified independent  with mobility and lived with Alone in a House home.  Home access is 1 to porch and then threaholdStairs to  enter.  Patient will benefit from skilled PT intervention to maximize safe functional mobility, minimize fall risk, and decrease caregiver burden for planned discharge home with 24 hour supervision.  Anticipate patient will benefit from follow up HH at discharge.  PT - End of Session Activity Tolerance: Tolerates 30+ min activity with multiple rests Endurance Deficit: Yes PT Assessment Rehab Potential (ACUTE/IP ONLY): Good PT Patient demonstrates impairments in the following area(s): Balance;Behavior;Endurance;Motor;Pain;Perception;Safety PT Transfers Functional Problem(s): Bed Mobility;Bed to Chair;Furniture;Car PT Locomotion Functional Problem(s): Ambulation;Stairs PT Plan PT Intensity: Minimum of 1-2 x/day ,45 to 90 minutes PT Frequency: 5 out of 7 days PT Duration Estimated Length of Stay: 10-14 Days PT Treatment/Interventions: Ambulation/gait training;Community reintegration;DME/adaptive equipment instruction;Neuromuscular re-education;Psychosocial support;Stair training;UE/LE Strength taining/ROM;Balance/vestibular training;Discharge planning;Functional electrical stimulation;Pain management;UE/LE Coordination activities;Therapeutic Activities;Skin care/wound management;Cognitive remediation/compensation;Disease management/prevention;Visual/perceptual remediation/compensation;Therapeutic Exercise;Splinting/orthotics;Patient/family education;Functional mobility training PT Transfers Anticipated Outcome(s): Supervision PT Locomotion Anticipated Outcome(s): Supervision PT Recommendation Recommendations for Other Services: Neuropsych consult Follow Up Recommendations: Home health PT;24 hour supervision/assistance Patient destination: Home Equipment Recommended: To be determined   PT Evaluation Precautions/Restrictions Precautions Precautions: None Restrictions Weight Bearing Restrictions Per Provider Order: No General Chart Reviewed: Yes Family/Caregiver Present: Yes  Pain  Interference Pain Interference Pain Effect on Sleep: 1. Rarely or not at all Pain Interference with Therapy Activities: 1. Rarely or not at all Pain Interference with Day-to-Day Activities: 1. Rarely or not at all Home Living/Prior Functioning Home Living Family/patient expects to be discharged to:: Inpatient rehab Living Arrangements: Alone Available Help at Discharge: Family;Available PRN/intermittently Type of Home: House Home Access: Stairs to enter [employer] of Steps: 1 to porch and then threahold Entrance Stairs-Rails: None Home Layout: One level  Lives With: Alone Prior Function Level of Independence: Independent with basic ADLs;Independent with transfers;Independent with gait  Able to Take Stairs?: Yes Driving: No Vision/Perception  Vision - History Ability to See in Adequate Light: 0 Adequate Praxis Praxis: WFL  Cognition Overall Cognitive Status: Impaired/Different from baseline Arousal/Alertness: Awake/alert Attention: Focused;Sustained Focused Attention: Appears intact Sustained Attention: Impaired Sustained Attention Impairment: Functional basic Memory: Impaired Memory Impairment: Decreased recall of new information;Decreased short term memory;Retrieval deficit Decreased Short Term Memory: Functional basic Awareness: Impaired Awareness Impairment: Intellectual impairment Problem Solving: Impaired Problem Solving Impairment: Functional basic Behaviors: Impulsive;Verbal agitation;Perseveration;Poor frustration tolerance;Confabulation Safety/Judgment: Impaired Rancho Mirant Scales of Cognitive Functioning: Confused, Inappropriate Non-Agitated: Maximal Assistance Sensation Sensation Light Touch: Appears Intact Coordination Gross Motor Movements are Fluid and Coordinated: No Fine Motor Movements are Fluid and Coordinated: No Coordination and Movement Description: Generalized weakness Motor  Motor Motor: Within Functional Limits   Trunk/Postural Assessment  Cervical Assessment Cervical Assessment: Exceptions to [facility] (forward head) Thoracic Assessment Thoracic Assessment: Exceptions to [facility] (rounded shoulders) Lumbar Assessment Lumbar Assessment: Exceptions to [facility] (posterior pelvic tilt) Postural Control Postural Control: Deficits on evaluation (delayed and inadequate)  Balance Balance Balance Assessed: Yes Static Sitting Balance Static Sitting - Balance Support: No upper extremity supported Static Sitting - Level of Assistance: 5: Stand by assistance Dynamic Sitting Balance Dynamic Sitting - Balance Support: During functional activity Dynamic Sitting - Level of Assistance: 5: Stand by assistance Static Standing Balance Static Standing - Balance Support: During functional activity Static Standing - Level of Assistance: 4: Min assist Extremity Assessment  RLE Assessment RLE Assessment: Exceptions to [facility] General Strength Comments: Grossly 4/5 LLE  Assessment LLE Assessment: Exceptions to [facility] General Strength Comments: Grossly 4/5  Care Tool Care Tool Bed Mobility Roll left and right activity   Roll left and right assist level: Minimal Assistance - Patient > 75%    Sit to lying activity   Sit to lying assist level: Minimal Assistance - Patient > 75%    Lying to sitting on side of bed activity   Lying to sitting on side of bed assist level: the ability to move from lying on the back to sitting on the side of the bed with no back support.: Minimal Assistance - Patient > 75%     Care Tool Transfers Sit to stand transfer   Sit to stand assist level: Minimal Assistance - Patient > 75%    Chair/bed transfer   Chair/bed transfer assist level: Minimal Assistance - Patient > 75%    Car transfer   Car transfer assist level: Minimal Assistance - Patient > 75%      Care Tool Locomotion Ambulation   Assist level: Minimal Assistance - Patient > 75% Assistive device: Walker-rolling Max distance: 150'  Walk  10 feet activity   Assist level: Minimal Assistance - Patient > 75% Assistive device: Walker-rolling   Walk 50 feet with 2 turns activity   Assist level: Minimal Assistance - Patient > 75% Assistive device: Walker-rolling  Walk 150 feet activity   Assist level: Minimal Assistance - Patient > 75% Assistive device: Walker-rolling  Walk 10 feet on uneven surfaces activity   Assist level: Minimal Assistance - Patient > 75% Assistive device: Walker-rolling  Stairs   Assist level: Minimal Assistance - Patient > 75% Stairs assistive device: 2 hand rails Max number of stairs: 12  Walk up/down 1 step activity   Walk up/down 1 step (curb) assist level: Minimal Assistance - Patient > 75% Walk up/down 1 step or curb assistive device: 2 hand rails  Walk up/down 4 steps activity   Walk up/down 4 steps assist level: Minimal Assistance - Patient > 75% Walk up/down 4 steps assistive device: 2 hand rails  Walk up/down 12 steps activity   Walk up/down 12 steps assist level: Minimal Assistance - Patient > 75% Walk up/down 12 steps assistive device: 2 hand rails  Pick up small objects from floor   Pick up small object from the floor assist level: Maximal Assistance - Patient 25 - 49%    Wheelchair Is the patient using a wheelchair?: Yes Type of Wheelchair: Manual   Wheelchair assist level: Dependent - Patient 0% Max wheelchair distance: 150  Wheel 50 feet with 2 turns activity   Assist Level: Dependent - Patient 0%  Wheel 150 feet activity   Assist Level: Dependent - Patient 0%    Refer to Care Plan for Long Term Goals  SHORT TERM GOAL WEEK 1 PT Short Term Goal 1 (Week 1): Pt will complete bed mobility with CGA. PT Short Term Goal 2 (Week 1): Pt will complete sit to stand transfer with CGA. PT Short Term Goal 3 (Week 1): Pt will complete bed to chair transfer with minA. PT Short Term Goal 4 (Week 1): Pt will ambulate x150' with CGA and LRAD.  Recommendations for other services:  Neuropsych  Skilled Therapeutic Intervention  Evaluation completed (see details above and below) with education on PT POC and goals and individual treatment initiated with focus on balance, transfers, ambulation, car transfer, and stair training. Pt received seated in WC and agrees to therapy. No complaint of pain. Pt verbalizes need to  void bladder. Pt performs stand pivot transfer from [facility] to toilet with grab bars and minA, with cues for sequencing and safety. Following, pt performs stand pivot back to [facility] with same assistance and cues. WC transport to gym. Pt performs sit to stand transfer with minA and cues for hand placement and body mechanics. Pt ambulates x150' with RW and minA, primaril for safe RW management, with cues for upright gaze to improve posture and balance, and increasing gait speed to decrease risk or falls. Following rest break, pt performs ramp navigation and car transfer with RW and minA, with cues for sequencing and safe AD management. Seated rest break. Pt completes x12 6" steps with bilateral hand rails and minA, with cues for safe step sequencing. WC transport back to room. Left seated in WC with alarm intact and all needs within reach.    Mobility Bed Mobility Bed Mobility: Sit to Supine;Supine to Sit Supine to Sit: Minimal Assistance - Patient > 75% Sit to Supine: Minimal Assistance - Patient > 75% Transfers Transfers: Sit to Stand;Stand to Sit;Stand Pivot Transfers Sit to Stand: Minimal Assistance - Patient > 75% Stand to Sit: Minimal Assistance - Patient > 75% Stand Pivot Transfers: Minimal Assistance - Patient > 75% Stand Pivot Transfer Details: Verbal cues for sequencing;Verbal cues for technique;Verbal cues for precautions/safety Transfer (Assistive device): Rolling walker Locomotion  Gait Ambulation: Yes Gait Assistance: Minimal Assistance - Patient > 75% Gait Distance (Feet): 150 Feet Assistive device: Rolling walker Gait Assistance Details: Verbal cues for  sequencing;Verbal cues for technique;Verbal cues for precautions/safety;Tactile cues for sequencing Gait Gait: Yes Gait Pattern: Impaired Gait Pattern: Decreased stride length;Trunk flexed Gait velocity: decr Stairs / Additional Locomotion Stairs: Yes Stairs Assistance: Minimal Assistance - Patient > 75% Number of Stairs: 12 Height of Stairs: 6 Ramp: Minimal Assistance - Patient >75% Curb: Minimal Assistance - Patient >75% Wheelchair Mobility Wheelchair Mobility: No   Discharge Criteria: Patient will be discharged from PT if patient refuses treatment 3 consecutive times without medical reason, if treatment goals not met, if there is a change in medical status, if patient makes no progress towards goals or if patient is discharged from hospital.  The above assessment, treatment plan, treatment alternatives and goals were discussed and mutually agreed upon: by patient  Beau Fanny, PT, DPT 07/09/2023, 4:27 PM

## 2023-07-09 NOTE — Evaluation (Addendum)
 Speech Language Pathology Assessment and Plan  Patient Details  Name: Peggy Ramirez MRN: [id number] Date of Birth: 05/18/1933  SLP Diagnosis: Cognitive Impairments  Rehab Potential: Fair ELOS: 10-14 days    Today's Date: 07/09/2023 SLP Individual Time:  1300-1400 SLP Individual Time Calculation (min): 60 min    Hospital Problem: Principal Problem:   Traumatic subdural hematoma (HCC)  Past Medical History:  Past Medical History:  Diagnosis Date   Acute metabolic encephalopathy    Chronic constipation    Chronic left lower quadrant pain    GERD (gastroesophageal reflux disease)    Headache(784.0)    High cholesterol    Hydrosalpinx    LEFT   Hypertension    Lupus    Membranous glomerulonephritis    followed by Dr. Clelia Schaumann   Ovarian fibroma    RIGHT   Pelvic adhesions    Sjogren's syndrome (HCC)    Stroke [facility])    Past Surgical History:  Past Surgical History:  Procedure Laterality Date   APPENDECTOMY  04/28/2000   BIOPSY  12/17/2020   Procedure: BIOPSY;  Surgeon: Lanelle Bal, DO;  [facility];  Service: Endoscopy;;   COLONOSCOPY WITH PROPOFOL N/A 12/17/2020   Procedure: COLONOSCOPY WITH PROPOFOL;  Surgeon: Lanelle Bal, DO;  [facility];  Service: Endoscopy;  Laterality: N/A;   FRACTURE SURGERY     HIP PINNING,CANNULATED Left 12/15/2019   Procedure: CANNULATED HIP PINNING;  Surgeon: Roby Lofts, MD;  Location: [facility] OR;  Service: Orthopedics;  Laterality: Left;   LAPAROSCOPIC CHOLECYSTECTOMY  04/28/2000   TONSILLECTOMY  04/29/1939   TOTAL ABDOMINAL HYSTERECTOMY W/ BILATERAL SALPINGOOPHORECTOMY  04/29/2003   VAGINAL HYSTERECTOMY  04/29/1979    Assessment / Plan / Recommendation Clinical Impression Pt is an 89 yo female who presented 07/04/2023 after reported fall without loss of consciousness. She was initially seen at [facility] and a CT of the head demonstrated a right sided subdural hemorrhage measuring 9-10 mm  without midline shift. Patient did sustain a scalp laceration stapled in the ED. CT cervical spine did not demonstrate any traumatic injury. Admission chemistries were unremarkable. She was transferred to [facility] and receive neurosurgical follow-up consultation Dr. Hoyt Koch with conservative care. Placed on Keppra x 7 days for seizure prophylaxis. Latest follow-up CT imaging showed stable right subdural hematoma. She has had bouts of hypotension early on in her hospital stay requiring fluid boluses. Hyponatremia 127-130 received DDAVP. Patient with intermittent bouts of agitation or restlessness placed on Seroquel. Her Plavix for history of CVA remains on hold due to SDH.   The pt presented w/ severe to profound cognitive deficits overall. She scored a 9/30 on the [facility] Mental Status (SLUMS) Exam and deficits in the areas of IMM, STM, attention, orientation, and information processing were noted. Profound memory deficits were evident during structured and functional eval tasks, as pt required constant cueing to recall information from 1-5 minutes prior. SLP assisted pt to the bathroom at the end of structured tasks and severe sequencing, safety awareness, and problem solving deficits were evident. Of note, pt ambulated to the bathroom via rolling walker with minimal physical assist and was continent of bladder. Anticipate anxiety negatively impacts problem solving as well, as pt appeared to panic when leaving the bathroom. Unsure of what caused her to panic, but she repeatedly ran into the door frame with her walker and body with minimal awareness and required hand over hand assist to correct. She  briefly became agitated with SLP because of this, though agitation resolved once back in bed. No concerns re motor speech production or word finding. Brief swallow screen also completed with thin liquids via straw. No overt s/s of airway invasion noted and the pt's son reported no concern  re dysphagia.   At baseline, her family assists with majority of IADLs such as driving, med management, and money management. However, she was still living alone and managing ADLs independently prior to this admission. She would benefit from skilled ST services to target cognitive deficits, maximize pt independence, and reduce caregiver burden.      Skilled Therapeutic Interventions          SLP facilitated a cognitive-linguistic evaluation and brief bedside swallow screen to assess pt's cognitive-communication skills and determine need for additional skilled ST services. See above for more information.    SLP Assessment  Patient will need skilled Speech Lanaguage Pathology Services during CIR admission    Recommendations  SLP Diet Recommendations: Age appropriate regular solids;Thin Liquid Administration via: Straw Medication Administration: Whole meds with liquid Supervision: Patient able to self feed Compensations: Minimize environmental distractions;Slow rate;Small sips/bites Postural Changes and/or Swallow Maneuvers: Seated upright 90 degrees;Upright 30-60 min after meal Oral Care Recommendations: Oral care BID Recommendations for Other Services: Therapeutic Recreation consult Therapeutic Recreation Interventions: Pet therapy Patient destination: Home Follow up Recommendations: Home Health SLP;24 hour supervision/assistance Equipment Recommended: None recommended by SLP    SLP Frequency 3 to 5 out of 7 days   SLP Duration  SLP Intensity  SLP Treatment/Interventions 10-14 days  Minumum of 1-2 x/day, 30 to 90 minutes  Cognitive remediation/compensation;Internal/external aids;Cueing hierarchy;Environmental controls;Therapeutic Activities;Patient/family education;Functional tasks    Pain  No pain reported   SLP Evaluation Cognition Overall Cognitive Status: Impaired/Different from baseline Arousal/Alertness: Awake/alert Orientation Level: Oriented to person;Oriented to  situation Year: 2025 Day of Week: Incorrect Attention: Focused;Sustained Focused Attention: Appears intact Sustained Attention: Impaired Sustained Attention Impairment: Verbal basic;Functional basic Memory: Impaired Memory Impairment: Decreased recall of new information;Decreased short term memory;Retrieval deficit;Storage deficit Decreased Short Term Memory: Verbal basic;Functional basic Awareness: Impaired Awareness Impairment: Intellectual impairment Problem Solving: Impaired Problem Solving Impairment: Functional basic;Verbal basic Executive Function: Sequencing;Reasoning;Self Monitoring Reasoning: Impaired Reasoning Impairment: Verbal basic;Functional basic Sequencing: Impaired Sequencing Impairment: Verbal basic;Functional basic Self Monitoring: Impaired Self Monitoring Impairment: Verbal basic;Functional basic Behaviors: Impulsive;Restless;Poor frustration tolerance;Confabulation;Verbal agitation Safety/Judgment: Impaired Rancho Mirant Scales of Cognitive Functioning: Confused/Agitated: Maximal Assistance  Comprehension Auditory Comprehension Overall Auditory Comprehension: Appears within functional limits for tasks assessed Expression Expression Primary Mode of Expression: Verbal Verbal Expression Overall Verbal Expression: Appears within functional limits for tasks assessed Written Expression Dominant Hand: Right Oral Motor Oral Motor/Sensory Function Overall Oral Motor/Sensory Function: Within functional limits (very slight tongue deviation to the R, though overall WFL) Motor Speech Overall Motor Speech: Appears within functional limits for tasks assessed  Care Tool Care Tool Cognition Ability to hear (with hearing aid or hearing appliances if normally used Ability to hear (with hearing aid or hearing appliances if normally used): 1. Minimal difficulty - difficulty in some environments (e.g. when person speaks softly or setting is noisy)   Expression of Ideas  and Wants Expression of Ideas and Wants: 3. Some difficulty - exhibits some difficulty with expressing needs and ideas (e.g, some words or finishing thoughts) or speech is not clear   Understanding Verbal and Non-Verbal Content Understanding Verbal and Non-Verbal Content: 2. Sometimes understands - understands only basic conversations or simple, direct phrases. Frequently requires cues  to understand  Memory/Recall Ability Memory/Recall Ability : That he or she is in a hospital/hospital unit   Motor Speech Assessment  WFL  Bedside Swallowing Assessment See clinical impression   Short Term Goals: Week 1: SLP Short Term Goal 1 (Week 1): Pt will utilize external memory aids as needed to recall orientation information given maxA SLP Short Term Goal 2 (Week 1): Pt will utilize external memory aids to recall recent events with 25% accuracy and maxA SLP Short Term Goal 3 (Week 1): Pt will complete functional problem solving tasks with maxA SLP Short Term Goal 4 (Week 1): Pt will sustain attention during functional cognitive tasks for 5 mins given maxA  Refer to Care Plan for Long Term Goals  Recommendations for other services: Therapeutic Recreation  Pet therapy  Discharge Criteria: Patient will be discharged from SLP if patient refuses treatment 3 consecutive times without medical reason, if treatment goals not met, if there is a change in medical status, if patient makes no progress towards goals or if patient is discharged from hospital.  The above assessment, treatment plan, treatment alternatives and goals were discussed and mutually agreed upon: by patient and by family  [name] 07/09/2023, 9:10 PM

## 2023-07-09 NOTE — Plan of Care (Signed)
 Problem: RH Balance Goal: LTG: Patient will maintain dynamic sitting balance (OT) Description: LTG:  Patient will maintain dynamic sitting balance with assistance during activities of daily living (OT) Flowsheets (Taken 07/09/2023 1527) LTG: Pt will maintain dynamic sitting balance during ADLs with: Supervision/Verbal cueing Goal: LTG Patient will maintain dynamic standing with ADLs (OT) Description: LTG:  Patient will maintain dynamic standing balance with assist during activities of daily living (OT)  Flowsheets (Taken 07/09/2023 1527) LTG: Pt will maintain dynamic standing balance during ADLs with: Supervision/Verbal cueing   Problem: Sit to Stand Goal: LTG:  Patient will perform sit to stand in prep for activites of daily living with assistance level (OT) Description: LTG:  Patient will perform sit to stand in prep for activites of daily living with assistance level (OT) Flowsheets (Taken 07/09/2023 1527) LTG: PT will perform sit to stand in prep for activites of daily living with assistance level: Supervision/Verbal cueing   Problem: RH Grooming Goal: LTG Patient will perform grooming w/assist,cues/equip (OT) Description: LTG: Patient will perform grooming with assist, with/without cues using equipment (OT) Flowsheets (Taken 07/09/2023 1527) LTG: Pt will perform grooming with assistance level of: Supervision/Verbal cueing   Problem: RH Bathing Goal: LTG Patient will bathe all body parts with assist levels (OT) Description: LTG: Patient will bathe all body parts with assist levels (OT) Flowsheets (Taken 07/09/2023 1527) LTG: Pt will perform bathing with assistance level/cueing: Supervision/Verbal cueing   Problem: RH Dressing Goal: LTG Patient will perform upper body dressing (OT) Description: LTG Patient will perform upper body dressing with assist, with/without cues (OT). Flowsheets (Taken 07/09/2023 1527) LTG: Pt will perform upper body dressing with assistance level of:  Supervision/Verbal cueing Goal: LTG Patient will perform lower body dressing w/assist (OT) Description: LTG: Patient will perform lower body dressing with assist, with/without cues in positioning using equipment (OT) Flowsheets (Taken 07/09/2023 1527) LTG: Pt will perform lower body dressing with assistance level of: Supervision/Verbal cueing   Problem: RH Toileting Goal: LTG Patient will perform toileting task (3/3 steps) with assistance level (OT) Description: LTG: Patient will perform toileting task (3/3 steps) with assistance level (OT)  Flowsheets (Taken 07/09/2023 1527) LTG: Pt will perform toileting task (3/3 steps) with assistance level: Supervision/Verbal cueing   Problem: RH Toilet Transfers Goal: LTG Patient will perform toilet transfers w/assist (OT) Description: LTG: Patient will perform toilet transfers with assist, with/without cues using equipment (OT) Flowsheets (Taken 07/09/2023 1527) LTG: Pt will perform toilet transfers with assistance level of: Supervision/Verbal cueing   Problem: RH Tub/Shower Transfers Goal: LTG Patient will perform tub/shower transfers w/assist (OT) Description: LTG: Patient will perform tub/shower transfers with assist, with/without cues using equipment (OT) Flowsheets (Taken 07/09/2023 1527) LTG: Pt will perform tub/shower stall transfers with assistance level of: Supervision/Verbal cueing   Problem: RH Memory Goal: LTG Patient will demonstrate ability for day to day recall/carry over during activities of daily living with assistance level (OT) Description: LTG:  Patient will demonstrate ability for day to day recall/carry over during activities of daily living with assistance level (OT). Flowsheets (Taken 07/09/2023 1527) LTG:  Patient will demonstrate ability for day to day recall/carry over during activities of daily living with assistance level (OT): Supervision   Problem: RH Attention Goal: LTG Patient will demonstrate this level of attention  during functional activites (OT) Description: LTG:  Patient will demonstrate this level of attention during functional activites  (OT) Flowsheets (Taken 07/09/2023 1527) Patient will demonstrate this level of attention during functional activites: Selective Patient will demonstrate above attention level  in the following environment: Home LTG: Patient will demonstrate this level of attention during functional activites (OT): Supervision   Problem: RH Awareness Goal: LTG: Patient will demonstrate awareness during functional activites type of (OT) Description: LTG: Patient will demonstrate awareness during functional activites type of (OT) Flowsheets (Taken 07/09/2023 1527) Patient will demonstrate awareness during functional activites type of: Emergent LTG: Patient will demonstrate awareness during functional activites type of (OT): Moderate Assistance - Patient 50 - 74%

## 2023-07-09 NOTE — Discharge Instructions (Addendum)
 Inpatient Rehab Discharge Instructions  Peggy Ramirez Discharge date and time: No discharge date for patient encounter.   Activities/Precautions/ Functional Status: Activity: activity as tolerated Diet: Regular Wound Care: Routine skin checks Functional status:  ___ No restrictions     ___ Walk up steps independently ___ 24/7 supervision/assistance   ___ Walk up steps with assistance ___ Intermittent supervision/assistance  ___ Bathe/dress independently ___ Walk with walker     _x__ Bathe/dress with assistance ___ Walk Independently    ___ Shower independently ___ Walk with assistance    ___ Shower with assistance ___ No alcohol     ___ Return to work/school ________  Special Instructions: No driving smoking or alcohol  Continue to hold Plavix until further notice due to subdural hematoma    COMMUNITY REFERRALS UPON DISCHARGE:    Home Health:   PT      OT      ST    SNA                Agency: [facility]   Phone: [phone]  *Please expect follow-up within 2-3 business days for discharge to schedule your home visit. If you have not received follow-up, be sure to contact the site directly.*   Medical Equipment/Items Ordered:  wheelchair                                                 Agency/Supplier: [facility] [phone]   My questions have been answered and I understand these instructions. I will adhere to these goals and the provided educational materials after my discharge from the hospital.  Patient/Caregiver Signature _______________________________ Date __________  Clinician Signature _______________________________________ Date __________  Please bring this form and your medication list with you to all your follow-up doctor's appointments.

## 2023-07-09 NOTE — Care Management (Signed)
 Inpatient Rehabilitation Center Individual Statement of Services  Patient Name:  Peggy Ramirez  Date:  07/09/2023  Welcome to the Inpatient Rehabilitation Center.  Our goal is to provide you with an individualized program based on your diagnosis and situation, designed to meet your specific needs.  With this comprehensive rehabilitation program, you will be expected to participate in at least 3 hours of rehabilitation therapies Monday-Friday, with modified therapy programming on the weekends.  Your rehabilitation program will include the following services:  Physical Therapy (PT), Occupational Therapy (OT), 24 hour per day rehabilitation nursing, Therapeutic Recreaction (TR), Psychology, Neuropsychology, Care Coordinator, Rehabilitation Medicine, Nutrition Services, Pharmacy Services, and Other  Weekly team conferences will be held on Tuesdays to discuss your progress.  Your Inpatient Rehabilitation Care Coordinator will talk with you frequently to get your input and to update you on team discussions.  Team conferences with you and your family in attendance may also be held.  Expected length of stay: 10-14 days    Overall anticipated outcome: Supervision  Depending on your progress and recovery, your program may change. Your Inpatient Rehabilitation Care Coordinator will coordinate services and will keep you informed of any changes. Your Inpatient Rehabilitation Care Coordinator's name and contact numbers are listed  below.  The following services may also be recommended but are not provided by the Inpatient Rehabilitation Center:  Driving Evaluations Home Health Rehabiltiation Services Outpatient Rehabilitation Services Vocational Rehabilitation   Arrangements will be made to provide these services after discharge if needed.  Arrangements include referral to agencies that provide these services.  Your insurance has been verified to be:  [facility] Medicare  Your primary doctor is:  Fara Chute  Pertinent information will be shared with your doctor and your insurance company.  Inpatient Rehabilitation Care Coordinator:  Susie Cassette [phone] or [phone]  Information discussed with and copy given to patient by: Gretchen Short, 07/09/2023, 5:24 PM

## 2023-07-09 NOTE — Progress Notes (Signed)
 Inpatient Rehabilitation  Patient information reviewed and entered into eRehab system by [facility]. Karen Kays., CCC/SLP, PPS Coordinator.  Information including medical coding, functional ability and quality indicators will be reviewed and updated through discharge.

## 2023-07-09 NOTE — Progress Notes (Signed)
 Inpatient Rehabilitation Admission Medication Review by a Pharmacist  A complete drug regimen review was completed for this patient to identify any potential clinically significant medication issues.  High Risk Drug Classes Is patient taking? Indication by Medication  Antipsychotic Yes Quetiapine - agitation  Anticoagulant No   Antibiotic No   Opioid No   Antiplatelet No   Hypoglycemics/insulin No   Vasoactive Medication No   Chemotherapy No   Other No Pantoprazole - reflux Simvastatin - HLD Trazodone - sleep Keppra - seizure ppx - ends 3/16     Type of Medication Issue Identified Description of Issue Recommendation(s)  Drug Interaction(s) (clinically significant)     Duplicate Therapy     Allergy     No Medication Administration End Date     Incorrect Dose     Additional Drug Therapy Needed     Significant med changes from prior encounter (inform family/care partners about these prior to discharge).    Other       Clinically significant medication issues were identified that warrant physician communication and completion of prescribed/recommended actions by midnight of the next day:  No  Name of provider notified for urgent issues identified:   Provider Method of Notification:    Pharmacist comments: Stop date for Keppra 3/16 Plavix stopped on acute side for [facility]  Time spent performing this drug regimen review (minutes):  20 minutes  Okey Regal, PharmD

## 2023-07-09 NOTE — Plan of Care (Signed)
  Problem: RH Cognition - SLP Goal: RH LTG Patient will demonstrate orientation with cues Description:  LTG:  Patient will demonstrate orientation to person/place/time/situation with cues (SLP)   Flowsheets (Taken 07/09/2023 2120) LTG Patient will demonstrate orientation to:  Person  Place  Time  Situation LTG: Patient will demonstrate orientation using cueing (SLP): Moderate Assistance - Patient 50 - 74% Note: With use of external aids   Problem: RH Problem Solving Goal: LTG Patient will demonstrate problem solving for (SLP) Description: LTG:  Patient will demonstrate problem solving for basic/complex daily situations with cues  (SLP) Flowsheets (Taken 07/09/2023 2120) LTG: Patient will demonstrate problem solving for (SLP): Basic daily situations LTG Patient will demonstrate problem solving for: Moderate Assistance - Patient 50 - 74%   Problem: RH Memory Goal: LTG Patient will use memory compensatory aids to (SLP) Description: LTG:  Patient will use memory compensatory aids to recall biographical/new, daily complex information with cues (SLP) Flowsheets (Taken 07/09/2023 2120) LTG: Patient will use memory compensatory aids to (SLP): Maximal Assistance - Patient 25 - 49%   Problem: RH Attention Goal: LTG Patient will demonstrate this level of attention during functional activites (SLP) Description: LTG:  Patient will will demonstrate this level of attention during functional activites (SLP) Flowsheets (Taken 07/09/2023 2120) Patient will demonstrate during cognitive/linguistic activities the attention type of: Sustained Patient will demonstrate this level of attention during cognitive/linguistic activities in: Controlled LTG: Patient will demonstrate this level of attention during cognitive/linguistic activities with assistance of (SLP): Moderate Assistance - Patient 50 - 74% Number of minutes patient will demonstrate attention during cognitive/linguistic activities: 5

## 2023-07-10 DIAGNOSIS — S065X9S Traumatic subdural hemorrhage with loss of consciousness of unspecified duration, sequela: Secondary | ICD-10-CM | POA: Diagnosis not present

## 2023-07-10 MED ORDER — CARBOXYMETHYLCELLUL-GLYCERIN 1-0.9 % OP GEL
1.0000 [drp] | Freq: Four times a day (QID) | OPHTHALMIC | Status: DC
  Administered 2023-07-10 – 2023-07-25 (×56): 1 [drp] via OPHTHALMIC

## 2023-07-10 MED ORDER — GABAPENTIN 100 MG PO CAPS: 100.0000 mg | ORAL_CAPSULE | Freq: Two times a day (BID) | ORAL | Status: DC | PRN

## 2023-07-10 MED ORDER — BUTALBITAL-APAP-CAFFEINE 50-325-40 MG PO TABS
1.0000 | ORAL_TABLET | Freq: Four times a day (QID) | ORAL | Status: DC | PRN
  Administered 2023-07-10 – 2023-07-12 (×5): 1 via ORAL
  Administered 2023-07-13: 2 via ORAL
  Administered 2023-07-14 (×2): 1 via ORAL
  Filled 2023-07-10 (×5): qty 1
  Filled 2023-07-10: qty 2
  Filled 2023-07-10 (×2): qty 1

## 2023-07-10 MED ORDER — ACETAMINOPHEN 500 MG PO TABS: 1000.0000 mg | ORAL_TABLET | Freq: Three times a day (TID) | ORAL | Status: DC

## 2023-07-10 NOTE — Plan of Care (Signed)
 Behavioral Plan Guideline Note  Patient Details  Name: Peggy Ramirez MRN: [id number] Date of Birth: 04-12-1934   Behavioral Plan:     07/10/23 0700  Behavioral Plan Guideline  [place] Level V-Confused, Inappropriate Non-Agitated  Behavior to decrease/eliminate Decrease verbal agitation with staff; Decrease impulsivity; Improve pain; Increase participation; Decrease restlessness; Decrease incontinence  Changes to environment Lights on, blinds open during the day, off and closed at night; Minimize in room stimulation; Door open when staff not in room; Within arms reach during toileting  Minimize in room stimulation  TV off; Limit visitors to no more than 2 at a time  Interventions Telesitter; Bed alarm setting; Timed toileting; Limit nighttime interruptions  Bed alarm setting Medium sensitivity  Recommendations for interactions with patient Remain calm and reduce environment stimulation with agitation; Redirect behaviors to alternate tasks/topics; DO NOT argue with patient; Provide gentle orientation or indirect orientation; Offer toileting with every interaction Leave patient's shoes on even in bed  In Attendance at Behavior Plan Meeting  OT; PT; SLP; RN    This behavior plan was developed with this patient's interdisciplinary team. It will be updated weekly on the day of conference or as needed with changing behaviors to keep the patient and staff safe. A copy of the Behavior plan will be posted in the patient room, at the RN station with the charge RN and in the patient medical record. If you have any questions or need clarification, please feel free to discuss with the interdisciplinary team mentioned above.  Edwin Cap PT, DPT 07/10/2023, 11:27 AM

## 2023-07-10 NOTE — Progress Notes (Addendum)
 PROGRESS NOTE   Subjective/Complaints:  Patient seen and examined in therapy gym, complaining of left-sided headache with some light sensitivity.  Is improved when donning sunglasses.  No nausea, vomiting, or vision changes.  Not responsive to Tylenol 3 25 mg.  Otherwise, sleeping well, no other complaints.  Remains incontinent of urine.  ROS: Denies fevers, chills, N/V, abdominal pain, constipation, diarrhea, SOB, cough, chest pain, new weakness or paraesthesias.   + Headache - ongoing  Objective:   No results found. Recent Labs    07/09/23 0608  WBC 5.7  HGB 12.0  HCT 33.6*  PLT 253   Recent Labs    07/08/23 0653 07/09/23 0608  NA 130* 135  K 3.5 3.8  CL 100 102  CO2 24 27  GLUCOSE 89 96  BUN 11 9  CREATININE 0.74 0.63  CALCIUM 10.0 10.3    Intake/Output Summary (Last 24 hours) at 07/10/2023 0948 Last data filed at 07/10/2023 0700 Gross per 24 hour  Intake 560 ml  Output --  Net 560 ml        Physical Exam: Vital Signs Blood pressure 125/62, pulse 60, temperature 98.8 F (37.1 C), resp. rate 14, height 4\' 11"  (1.499 m), weight 41.9 kg, SpO2 97%. Constitutional: No apparent distress. Appropriate appearance for age.  Sitting up in therapy gym. HENT: No JVD. Neck Supple. Trachea midline. Atraumatic, normocephalic. Eyes: PERRLA. EOMI. Visual fields grossly intact.  Keeps left eye preferentially closed due to photosensitivity. Cardiovascular: RRR, no murmurs/rub/gallops. No Edema. Peripheral pulses 2+  Respiratory: CTAB. No rales, rhonchi, or wheezing. On RA.  Abdomen: + bowel sounds, normoactive. No distention or tenderness.  GU: Not examined.  Skin: C/D/I. No apparent lesions.  Small scalp laceration, clean dry, well-approximated.  Peripheral IV intact. MSK:      No apparent deformity.  Full passive range of motion all 4 extremities.       Neurologic exam:  Cognition: AAO to person, place, time and  event.  + Mild memory deficit, mild attention deficit + Perseveration   Language: Fluent, No substitutions or neoglisms. No dysarthria.  Insight: Fair to poor insight into current condition.  Mood: Moderately labile, pleasant and then crying--per son, this is her baseline Sensation: Equal and intact in BL UE and Les.  Reflexes: 2+ in BL UE and LEs.  Strength: Moving all 4 extremities antigravity against resistance, approximately equal. CN: 2-12 grossly intact.  Coordination: No apparent tremors. No ataxia on FTN, HTS bilaterally.  Spasticity: MAS 0 in all extremities.   No significant changes 3-14     Assessment/Plan: 1. Functional deficits which require 3+ hours per day of interdisciplinary therapy in a comprehensive inpatient rehab setting. Physiatrist is providing close team supervision and 24 hour management of active medical problems listed below. Physiatrist and rehab team continue to assess barriers to discharge/monitor patient progress toward functional and medical goals  Care Tool:  Bathing    Body parts bathed by patient: Right arm, Left arm, Left upper leg, Right lower leg, Chest, Abdomen, Front perineal area, Buttocks, Right upper leg, Left lower leg, Face         Bathing assist Assist Level: Minimal Assistance - Patient >  75%     Upper Body Dressing/Undressing Upper body dressing   What is the patient wearing?: Bra, Pull over shirt    Upper body assist Assist Level: Minimal Assistance - Patient > 75%    Lower Body Dressing/Undressing Lower body dressing      What is the patient wearing?: Pants, Incontinence brief     Lower body assist Assist for lower body dressing: Minimal Assistance - Patient > 75%     Toileting Toileting    Toileting assist Assist for toileting: Total Assistance - Patient < 25%     Transfers Chair/bed transfer  Transfers assist  Chair/bed transfer activity did not occur: Safety/medical concerns  Chair/bed transfer assist  level: Minimal Assistance - Patient > 75%     Locomotion Ambulation   Ambulation assist      Assist level: Minimal Assistance - Patient > 75% Assistive device: Walker-rolling Max distance: 150'   Walk 10 feet activity   Assist     Assist level: Minimal Assistance - Patient > 75% Assistive device: Walker-rolling   Walk 50 feet activity   Assist    Assist level: Minimal Assistance - Patient > 75% Assistive device: Walker-rolling    Walk 150 feet activity   Assist    Assist level: Minimal Assistance - Patient > 75% Assistive device: Walker-rolling    Walk 10 feet on uneven surface  activity   Assist     Assist level: Minimal Assistance - Patient > 75% Assistive device: Development worker, international aid     Assist Is the patient using a wheelchair?: Yes Type of Wheelchair: Manual    Wheelchair assist level: Dependent - Patient 0% Max wheelchair distance: 150    Wheelchair 50 feet with 2 turns activity    Assist        Assist Level: Dependent - Patient 0%   Wheelchair 150 feet activity     Assist      Assist Level: Dependent - Patient 0%   Blood pressure 125/62, pulse 60, temperature 98.8 F (37.1 C), resp. rate 14, height 4\' 11"  (1.499 m), weight 41.9 kg, SpO2 97%. Medical Problem List and Plan: 1. Functional deficits secondary to traumatic SDH with scalp laceration stapled in the ED as well as history Sjorgens syndrome/SLE             -patient may shower             -ELOS/Goals: 10-14 days S            -Stable to continue CIR  2.  Antithrombotics: -DVT/anticoagulation:  Mechanical: Antiembolism stockings, thigh (TED hose) Bilateral lower extremities             -antiplatelet therapy: N/A  3. Pain Management: Tylenol as needed  -No complaints of pain, headaches.  3-14: Complaining of severe right-sided headaches, 10 out of 10, not responsive to Tylenol.  Has documented history of allergy to Topamax and with elevated LFTs, will  not schedule max dose Tylenol.  Switched to Fioricet 1-2 tabs every 6 hours as needed.  4. Mood/Behavior/Sleep: Trazodone 75 mg nightly.  Provide emotional support             -antipsychotic agents: Seroquel nightly as needed  -3-13: Add as needed melatonin. Add sleep log  3/14: Sleeping well overnight--did have documented use of Seroquel, no events documented overnight, spotted well.  Continue current medications.  5. Neuropsych/cognition: This patient is not capable of making decisions on her own behalf.  -3-14: Subarachnoid hemorrhage 3-8,  should be finishing 7 days of Keppra today.  Hopefully will improve once Keppra is off.  Continue TeleSitter for now  6. Skin/Wound Care: Routine skin checks  7. Fluids/Electrolytes/Nutrition: Routine in and outs with follow-up chemistries   - AM labs stable  8.  Seizure prophylaxis.  Continue 7-day course of Keppra.   9.  History of CVA.  Plavix currently on hold due to SDH.   10.  Hyperlipidemia.  Continue Zocor   11.  Hypertension.  Restart Norvasc 2.5mg  daily.    - monitor with therapies--tolerating well    07/10/2023    6:51 AM 07/10/2023    6:39 AM 07/10/2023    5:00 AM  Vitals with BMI  Weight   92 lbs 6 oz  BMI   18.65  Systolic 125 136   Diastolic 62 60   Pulse 60      12.  Hyponatremia.  continue sodium chloride tablets.  Follow-up chemistries    - resolved on admission labs; reduce chloride tabs form TID to 1 g BID; recheck Saturday for further titration  13.  History of membranous glomerulonephritis.  Patient not on immunosuppression followed by Dr. Clelia Schaumann   - BUN and CR stable  14. Transaminitis.   - reduce PRN tylenol dose to 325 mg   - recheck Monday  LOS: 2 days A FACE TO FACE EVALUATION WAS PERFORMED  Angelina Sheriff 07/10/2023, 9:48 AM

## 2023-07-10 NOTE — Progress Notes (Signed)
 Occupational Therapy TBI Note  Patient Details  Name: Peggy Ramirez MRN: [id number] Date of Birth: 06/19/1933  Today's Date: 07/10/2023 OT Individual Time: 4098-1191 OT Individual Time Calculation (min): 75 min    Short Term Goals: Week 1:  OT Short Term Goal 1 (Week 1): Pt will perform toileting with contact guard -performing 3/3 steps OT Short Term Goal 2 (Week 1): Pt will shower sit to stand with contact guard with min cues OT Short Term Goal 3 (Week 1): Pt will pick out clothing with min cues OT Short Term Goal 4 (Week 1): Pt will transfer to toilet with LRAD with supervision OT Short Term Goal 5 (Week 1): Pt will be oriented x3 50% of the day with mod cues  Skilled Therapeutic Interventions/Progress Updates:    Pt received supine with no c/o pain, agreeable to OT session. LPN in room with medications. She was initially perseverative on soaking dentures. She was oriented to self, time, and place. She came to EOB with mod cueing for initiation. She required frequent cueing throughout session for hand placement during transfers and RW management, often reaching out for doorways. Sit > stand with min A to correct posterior bias. Pt with severe kyphotic posture. Functional mobility to the sink with the RW with min A. In standing she removed dentures and placed them in container to soak. She then transferred into the bathroom with min A and onto the toilet. Mod cueing for sequencing steps to remove clothing and sit on toilet. Continent urine void. She transferred into shower following. She completed UB and LB bathing seated with CGA. OT assisted with gentle hair care, verbal approval by PA to get staples wet. Following shower she transferred to the w/c with min A using RW. She completed UB dressing with (S). LB dressing with min A sit > stand. She was set up at the sink to complete oral care and grooming tasks. OT performed hair care to avoid staples. She asked about the time of day several times  during session and was emotional x2, unclear re source. She also asked several times whether her teeth were clean. She completed functional mobility to the side of the bed with min HHA. She transferred into supine with CGA. OT assisted with putting in eye drops per her instructions. She was left supine with all needs met, bed alarm set.   Therapy Documentation Precautions:  Precautions Precautions: None Restrictions Weight Bearing Restrictions Per Provider Order: No  Agitated Behavior Scale: TBI Observation Details Observation Environment: pt room Start of observation period - Date: 07/10/23 Start of observation period - Time: 0815 End of observation period - Date: 07/10/23 End of observation period - Time: 0930 Agitated Behavior Scale (DO NOT LEAVE BLANKS) Short attention span, easy distractibility, inability to concentrate: Present to a moderate degree Impulsive, impatient, low tolerance for pain or frustration: Absent Uncooperative, resistant to care, demanding: Absent Violent and/or threatening violence toward people or property: Absent Explosive and/or unpredictable anger: Absent Rocking, rubbing, moaning, or other self-stimulating behavior: Absent Pulling at tubes, restraints, etc.: Absent Wandering from treatment areas: Absent Restlessness, pacing, excessive movement: Absent Repetitive behaviors, motor, and/or verbal: Absent Rapid, loud, or excessive talking: Absent Sudden changes of mood: Absent Easily initiated or excessive crying and/or laughter: Absent Self-abusiveness, physical and/or verbal: Absent Agitated behavior scale total score: 16  Therapy/Group: Individual Therapy  [name] 07/10/2023, 6:41 AM

## 2023-07-10 NOTE — Progress Notes (Signed)
 Physical Therapy Session Note  Patient Details  Name: Peggy Ramirez MRN: [id number] Date of Birth: [birth year]  Today's Date: 07/10/2023 PT Individual Time: 1010-1115 PT Individual Time Calculation (min): 65 min   Short Term Goals: Week 1:  PT Short Term Goal 1 (Week 1): Pt will complete bed mobility with CGA. PT Short Term Goal 2 (Week 1): Pt will complete sit to stand transfer with CGA. PT Short Term Goal 3 (Week 1): Pt will complete bed to chair transfer with minA. PT Short Term Goal 4 (Week 1): Pt will ambulate x150' with CGA and LRAD.  Skilled Therapeutic Interventions/Progress Updates:    Pt presents in room in bed, pt daughter Peggy Ramirez present. Pt reporting headache, described as 10/10 pain and like she has "worms crawling in her head." Pt agreeable to PT. Session focused on therapeutic activities to facilitate participation with self care tasks including sequencing and attendance to tasks, gait training for tolerance to upright and navigating obstacles. Vitals monitored throughout session due to pt headache, noted below.  Pt completes bed mobility with supervision and min cues, pt agreeable to ambulate to bathroom with RW. Pt ambulates with short shuffling gait, CGA with RW to bathroom. Pt requires mod cues for managing LB dressing and brief with min assist. Pt completes transfer to toilet with CGA. Pt requires increased time seated on toilet with pt perseverating on needing to urinate however pt is continent of void. Pt becomes agitated when therapist prompts pt to stand following transfer with pt becoming upset that she is unable to urinate. Therapist gentle reorients her and pt agreeable to stand, able to manage brief and pants with CGA and min cues for attention to task. Pt ambulates to sink with RW with min assist for RW mgmt as pt demonstrating impaired attention to L side. Pt completes hand hygiene, sequencing well however requires min cues for attendance to task and locating paper  towels. Pt able to ambulate to [employer] with min cues for attention to L to locate WC.  Pt transported in [facility] dependently for time management to main gym. Pt vitals assessed, WNL. Pt ambulates with RW 90' completing L turns to promote improved attention to L side. Pt requires mod verbal cues for attention to L side and to prevent L path deviation as pt with poor spatial awareness for wall. Pt returns to sitting in [facility], requires cues for visual scanning to find WC to sit. Pt takes extended seated rest break, MD arrives to speak with pt. Pt notes light sensitivity, therapist provides sunglasses to decrease sensitivity with pt reporting improved symptoms.  Pt then completes gait training ambulating around 3 cones x4 trials, seated rest break between. Pt requires mod/max verbal cues for attendance to task and RW mgmt for L attention.  Pt provided with rest breaks throughout session, requires increased time to complete all activities and gait training due to impaired attention requiring mod cues to attend to task.  Pt returns to room and remains seated in [facility] with all needs within reach, cal light in place and chair alarm donned and activated at end of session. Pt daughter at bedside and RN notified to administer pain medication at end of session.   Vitals: Supine: BP 105/61, HR 60bpm Sitting: BP 130/64, HR 69bpm Sitting following ambulation: BP 143/74, HR 72bpm  Therapy Documentation Precautions:  Precautions Precautions: None Restrictions Weight Bearing Restrictions Per Provider Order: No   Therapy/Group: Individual Therapy  Edwin Cap PT, DPT 07/10/2023, 12:44 PM

## 2023-07-10 NOTE — IPOC Note (Signed)
 Overall Plan of Care [facility]) Patient Details Name: Peggy Ramirez MRN: [id number] DOB: [birth year]  Admitting Diagnosis: Traumatic subdural hematoma [facility])  Hospital Problems: Principal Problem:   Traumatic subdural hematoma (HCC)     Functional Problem List: Nursing Behavior, Bladder, Bowel, Endurance, Medication Management, Pain, Perception, Safety, Sensory, Skin Integrity  PT Balance, Behavior, Endurance, Motor, Pain, Perception, Safety  OT Balance, Cognition, Edema, Endurance, Motor, Pain, Safety, Sensory  SLP Cognition  TR         Basic ADL's: OT Grooming, Bathing, Dressing, Toileting     Advanced  ADL's: OT       Transfers: PT Bed Mobility, Bed to Chair, Furniture, Customer service manager, Research scientist (life sciences): PT Ambulation, Stairs     Additional Impairments: OT Fuctional Use of Upper Extremity  SLP Social Cognition   Problem Solving, Memory, Attention, Awareness  TR      Anticipated Outcomes Item Anticipated Outcome  Self Feeding n/a  Swallowing      Basic self-care  supervision  Toileting  supervision   Bathroom Transfers supervision  Bowel/Bladder  manage bowels with medications/manage bladder with time toileting  Transfers  Supervision  Locomotion  Supervision  Communication     Cognition  modA  Pain  <4 w/ prns  Safety/Judgment  manage safety withsupervision   Therapy Plan: PT Intensity: Minimum of 1-2 x/day ,45 to 90 minutes PT Frequency: 5 out of 7 days PT Duration Estimated Length of Stay: 10-14 Days OT Intensity: Minimum of 1-2 x/day, 45 to 90 minutes OT Frequency: 5 out of 7 days OT Duration/Estimated Length of Stay: ~10-14 days SLP Intensity: Minumum of 1-2 x/day, 30 to 90 minutes SLP Frequency: 3 to 5 out of 7 days SLP Duration/Estimated Length of Stay: 10-14 days   Team Interventions: Nursing Interventions Patient/Family Education, Bladder Management, Bowel Management, Disease Management/Prevention, Pain Management,  Discharge Planning, Cognitive Remediation/Compensation, Skin Care/Wound Management, Medication Management  PT interventions Ambulation/gait training, Community reintegration, DME/adaptive equipment instruction, Neuromuscular re-education, Psychosocial support, Stair training, UE/LE Strength taining/ROM, Warden/ranger, Discharge planning, Functional electrical stimulation, Pain management, UE/LE Coordination activities, Therapeutic Activities, Skin care/wound management, Cognitive remediation/compensation, Disease management/prevention, Visual/perceptual remediation/compensation, Therapeutic Exercise, Splinting/orthotics, Patient/family education, Functional mobility training  OT Interventions Balance/vestibular training, Discharge planning, Pain management, Self Care/advanced ADL retraining, Therapeutic Activities, UE/LE Coordination activities, Cognitive remediation/compensation, Disease mangement/prevention, Functional mobility training, Patient/family education, Skin care/wound managment, Therapeutic Exercise, Community reintegration, DME/adaptive equipment instruction, Neuromuscular re-education, Psychosocial support, Splinting/orthotics, UE/LE Strength taining/ROM  SLP Interventions Cognitive remediation/compensation, Internal/external aids, Financial trader, Environmental controls, Therapeutic Activities, Patient/family education, Functional tasks  TR Interventions    SW/CM Interventions Discharge Planning, Psychosocial Support, Patient/Family Education   Barriers to Discharge MD  Medical stability, Home enviroment access/loayout, Lack of/limited family support, Insurance for SNF coverage, and Behavior  Nursing Decreased caregiver support, Home environment access/layout, Incontinence, Behavior discharge:House  Home Access: Stairs to enter  Secretary/administrator of Steps: 1 to porch and then threahold  Entrance Stairs-Rails: None  Home Layout: One level  PT      OT      SLP Other  (comments) severity of deficits, advanced age  SW Decreased caregiver support, Lack of/limited family support, Community education officer for SNF coverage     Team Discharge Planning: Destination: PT-Home ,OT- Home , SLP-Home Projected Follow-up: PT-Home health PT, 24 hour supervision/assistance, OT-  Home health OT, 24 hour supervision/assistance, SLP-Home Health SLP, 24 hour supervision/assistance Projected Equipment Needs: PT-To be determined, OT- To be determined, SLP-None recommended by SLP  Equipment Details: PT- , OT-  Patient/family involved in discharge planning: PT- Patient, Family member/caregiver,  OT-Patient, SLP-Patient, Family member/caregiver  MD ELOS: 7 to 10 days Medical Rehab Prognosis:  Good Assessment: The patient has been admitted for CIR therapies with the diagnosis of SDH. The team will be addressing functional mobility, strength, stamina, balance, safety, adaptive techniques and equipment, self-care, bowel and bladder mgt, patient and caregiver education, . Goals have been set at supervision. Anticipated discharge destination is home       See Team Conference Notes for weekly updates to the plan of care

## 2023-07-10 NOTE — Progress Notes (Addendum)
 Speech Language Pathology TBI Note  Patient Details  Name: Peggy Ramirez MRN: [id number] Date of Birth: [birth year]  Today's Date: 07/10/2023 SLP Individual Time: 1430-1530 SLP Individual Time Calculation (min): 60 min  Short Term Goals: Week 1: SLP Short Term Goal 1 (Week 1): Pt will utilize external memory aids as needed to recall orientation information given maxA SLP Short Term Goal 2 (Week 1): Pt will utilize external memory aids to recall recent events with 25% accuracy and maxA SLP Short Term Goal 3 (Week 1): Pt will complete functional problem solving tasks with maxA SLP Short Term Goal 4 (Week 1): Pt will sustain attention during functional cognitive tasks for 5 mins given maxA  Skilled Therapeutic Interventions:   Pt greeted at bedside for tx session targeting cognition. She was awake/alert upon SLP arrival, calling out for help. She demonstrated a positive response to immediate verbal redirection from SLP. She was assisted to the bathroom upon request. She benefited from hand held assist to ambulate to the bathroom where she was continent of bladder. Once back in bed, she benefited from modA visual/verbal cues to utilize the calendar and verbalize orientation information. Despite orientation to time of day as well, pt was very confused and perseverative on preparing for bed. SLP provided passive re orientation each time. She sustained attention for ~3 mins w/ only modA cues during functional task of self feeding. She also benefited from maxA for problem solving and attention during card task comparing presented values. SLP assisted pt to create visual reminder given continued perseveration on prepping for bed and/or missing breakfast. At the end of tx tasks, she was left in bed with the alarm set on medium sensitivity and call light within reach. [name] also arriving upon SLP departure. Recommend cont ST per POC.   Of note, immediate slight throat clear x2 noted w/ thin liquids via  straw. Anticipate d/t large and consecutive sips, however, will continue to monitor and complete further swallow evaluation as needed.   Pain Pain Assessment Pain Scale: 0-10 Pain Score: 6  Pain Location: Head  Agitated Behavior Scale: TBI Observation Details Observation Environment: CIR Start of observation period - Date: 07/10/23 Start of observation period - Time: 1430 End of observation period - Date: 07/10/23 End of observation period - Time: 1530 Agitated Behavior Scale (DO NOT LEAVE BLANKS) Short attention span, easy distractibility, inability to concentrate: Present to a moderate degree Impulsive, impatient, low tolerance for pain or frustration: Absent Uncooperative, resistant to care, demanding: Absent Violent and/or threatening violence toward people or property: Absent Explosive and/or unpredictable anger: Absent Rocking, rubbing, moaning, or other self-stimulating behavior: Absent Pulling at tubes, restraints, etc.: Absent Wandering from treatment areas: Absent Restlessness, pacing, excessive movement: Absent Repetitive behaviors, motor, and/or verbal: Absent Rapid, loud, or excessive talking: Absent Sudden changes of mood: Absent Easily initiated or excessive crying and/or laughter: Absent Self-abusiveness, physical and/or verbal: Absent Agitated behavior scale total score: 16  Therapy/Group: Individual Therapy  [name] 07/10/2023, 4:01 PM

## 2023-07-11 DIAGNOSIS — S069XAS Unspecified intracranial injury with loss of consciousness status unknown, sequela: Secondary | ICD-10-CM | POA: Diagnosis not present

## 2023-07-11 DIAGNOSIS — S065X9D Traumatic subdural hemorrhage with loss of consciousness of unspecified duration, subsequent encounter: Secondary | ICD-10-CM | POA: Diagnosis not present

## 2023-07-11 DIAGNOSIS — R4189 Other symptoms and signs involving cognitive functions and awareness: Secondary | ICD-10-CM

## 2023-07-11 LAB — BASIC METABOLIC PANEL
Anion gap: 12 (ref 5–15)
BUN: 9 mg/dL (ref 8–23)
CO2: 26 mmol/L (ref 22–32)
Calcium: 10.4 mg/dL — ABNORMAL HIGH (ref 8.9–10.3)
Chloride: 100 mmol/L (ref 98–111)
Creatinine, Ser: 0.75 mg/dL (ref 0.44–1.00)
GFR, Estimated: >60 mL/min (ref >60)
Glucose, Bld: 94 mg/dL (ref 70–99)
Potassium: 4 mmol/L (ref 3.5–5.1)
Sodium: 138 mmol/L (ref 135–145)

## 2023-07-11 NOTE — Progress Notes (Addendum)
 PROGRESS NOTE   Subjective/Complaints:  Son at bedside, the patient had a shower with therapy today,   According to son Cherlynn Polo need to be out 2 weeks after placement, chart indicates 7 to 10 days Also son mentioned that a CT scan was to be performed 2 weeks after fall  ROS: Denies fevers, chills, N/V, abdominal pain, constipation, diarrhea, SOB, cough, chest pain, new weakness or paraesthesias.   + Headache - ongoing  Objective:   No results found. Recent Labs    07/09/23 0608  WBC 5.7  HGB 12.0  HCT 33.6*  PLT 253   Recent Labs    07/09/23 0608 07/11/23 0523  NA 135 138  K 3.8 4.0  CL 102 100  CO2 27 26  GLUCOSE 96 94  BUN 9 9  CREATININE 0.63 0.75  CALCIUM 10.3 10.4*    Intake/Output Summary (Last 24 hours) at 07/11/2023 1252 Last data filed at 07/11/2023 7253 Gross per 24 hour  Intake 720 ml  Output --  Net 720 ml        Physical Exam: Vital Signs Blood pressure 126/66, pulse 61, temperature 98.5 F (36.9 C), temperature source Oral, resp. rate 18, height 4\' 11"  (1.499 m), weight 40.7 kg, SpO2 96%.  General: No acute distress Mood and affect are appropriate Heart: Regular rate and rhythm no rubs murmurs or extra sounds Lungs: Clear to auscultation, breathing unlabored, no rales or wheezes Abdomen: Positive bowel sounds, soft nontender to palpation, nondistended Extremities: No clubbing, cyanosis, or edema   Skin: C/D/I. No apparent lesions.  Small scalp laceration, clean dry, well-approximated.  Peripheral IV intact. MSK:      No apparent deformity.  Full passive range of motion all 4 extremities.       Neurologic exam:  Cognition: AAO to person, time and event.  States she is at [facility] but thought that she was in [place] [facility] 2/3 objects after 5-minute delay  Language: Fluent, No substitutions or neoglisms. No dysarthria.  Insight: Fair to poor insight into current condition.   Mood: Moderately labile, pleasant a motor strength is 4/5 bilateral deltoid by stress of grip hip flexor knee extensor ankle dorsiflexor and plantar flexor Tone is normal    Assessment/Plan: 1. Functional deficits which require 3+ hours per day of interdisciplinary therapy in a comprehensive inpatient rehab setting. Physiatrist is providing close team supervision and 24 hour management of active medical problems listed below. Physiatrist and rehab team continue to assess barriers to discharge/monitor patient progress toward functional and medical goals  Care Tool:  Bathing    Body parts bathed by patient: Right arm, Left arm, Left upper leg, Right lower leg, Chest, Abdomen, Front perineal area, Buttocks, Right upper leg, Left lower leg, Face         Bathing assist Assist Level: Minimal Assistance - Patient > 75%     Upper Body Dressing/Undressing Upper body dressing   What is the patient wearing?: Bra, Pull over shirt    Upper body assist Assist Level: Minimal Assistance - Patient > 75%    Lower Body Dressing/Undressing Lower body dressing      What is the patient wearing?: Pants, Incontinence brief  Lower body assist Assist for lower body dressing: Minimal Assistance - Patient > 75%     Toileting Toileting    Toileting assist Assist for toileting: Total Assistance - Patient < 25%     Transfers Chair/bed transfer  Transfers assist  Chair/bed transfer activity did not occur: Safety/medical concerns  Chair/bed transfer assist level: Minimal Assistance - Patient > 75%     Locomotion Ambulation   Ambulation assist      Assist level: Minimal Assistance - Patient > 75% Assistive device: Walker-rolling Max distance: 150'   Walk 10 feet activity   Assist     Assist level: Minimal Assistance - Patient > 75% Assistive device: Walker-rolling   Walk 50 feet activity   Assist    Assist level: Minimal Assistance - Patient > 75% Assistive device:  Walker-rolling    Walk 150 feet activity   Assist    Assist level: Minimal Assistance - Patient > 75% Assistive device: Walker-rolling    Walk 10 feet on uneven surface  activity   Assist     Assist level: Minimal Assistance - Patient > 75% Assistive device: Development worker, international aid     Assist Is the patient using a wheelchair?: Yes Type of Wheelchair: Manual    Wheelchair assist level: Dependent - Patient 0% Max wheelchair distance: 150    Wheelchair 50 feet with 2 turns activity    Assist        Assist Level: Dependent - Patient 0%   Wheelchair 150 feet activity     Assist      Assist Level: Dependent - Patient 0%   Blood pressure 126/66, pulse 61, temperature 98.5 F (36.9 C), temperature source Oral, resp. rate 18, height 4\' 11"  (1.499 m), weight 40.7 kg, SpO2 96%. Medical Problem List and Plan: 1. Functional deficits secondary to traumatic SDH with scalp laceration stapled in the ED as well as history Sjorgens syndrome/SLE             -patient may shower             -ELOS/Goals: 10-14 days S            -Stable to continue CIR  2.  Antithrombotics: -DVT/anticoagulation:  Mechanical: Antiembolism stockings, thigh (TED hose) Bilateral lower extremities             -antiplatelet therapy: N/A  3. Pain Management: Tylenol as needed  -No complaints of pain, headaches.  3-14: Complaining of severe right-sided headaches, 10 out of 10, not responsive to Tylenol.  Has documented history of allergy to Topamax and with elevated LFTs, will not schedule max dose Tylenol.  Switched to Fioricet 1-2 tabs every 6 hours as needed.  4. Mood/Behavior/Sleep: Trazodone 75 mg nightly.  Provide emotional support             -antipsychotic agents: Seroquel nightly as needed  -3-13: Add as needed melatonin. Add sleep log  3/14: Sleeping well overnight--did have documented use of Seroquel, no events documented overnight, spotted well.  Continue current  medications.  5. Neuropsych/cognition: This patient is not capable of making decisions on her own behalf.  -3-14: Subarachnoid hemorrhage 3-8, should be finishing 7 days of Keppra today.  Hopefully will improve once Keppra is off.  Continue TeleSitter for now  6. Skin/Wound Care: Routine skin checks, should be ready for staple removal on 3/17  7. Fluids/Electrolytes/Nutrition: Routine in and outs with follow-up chemistries   - AM labs stable  8.  Seizure prophylaxis.  Continue  7-day course of Keppra.   9.  History of CVA.  Plavix currently on hold due to SDH.   10.  Hyperlipidemia.  Continue Zocor   11.  Hypertension.  Restart Norvasc 2.5mg  daily.    - monitor with therapies--tolerating well    07/11/2023    5:27 AM 07/11/2023    5:26 AM 07/10/2023    7:36 PM  Vitals with BMI  Weight 89 lbs 12 oz    BMI 18.11    Systolic  126 147  Diastolic  66 66  Pulse  61 64    12.  Hyponatremia.  continue sodium chloride tablets.  Follow-up chemistries    - resolved on admission labs; reduce chloride tabs form TID to 1 g BID; recheck Saturday for further titration    Latest Ref Rng & Units 07/11/2023    5:23 AM 07/09/2023    6:08 AM 07/08/2023    6:53 AM  BMP  Glucose 70 - 99 mg/dL 94  96  89   BUN 8 - 23 mg/dL 9  9  11    Creatinine 0.44 - 1.00 mg/dL 4.09  8.11  9.14   Sodium 135 - 145 mmol/L 138  135  130   Potassium 3.5 - 5.1 mmol/L 4.0  3.8  3.5   Chloride 98 - 111 mmol/L 100  102  100   CO2 22 - 32 mmol/L 26  27  24    Calcium 8.9 - 10.3 mg/dL 78.2  95.6  21.3     13.  History of membranous glomerulonephritis.  Patient not on immunosuppression followed by Dr. Clelia Schaumann   - BUN and CR stable  14. Transaminitis.   - reduce PRN tylenol dose to 325 mg   - recheck Monday  LOS: 3 days A FACE TO FACE EVALUATION WAS PERFORMED  Erick Colace 07/11/2023, 12:52 PM

## 2023-07-11 NOTE — Progress Notes (Signed)
 Speech Language Pathology TBI Note  Patient Details  Name: Peggy Ramirez MRN: [id number] Date of Birth: 1933-06-30  Today's Date: 07/11/2023 SLP Individual Time: 4098-1191 SLP Individual Time Calculation (min): 44 min  Short Term Goals: Week 1: SLP Short Term Goal 1 (Week 1): Pt will utilize external memory aids as needed to recall orientation information given maxA SLP Short Term Goal 2 (Week 1): Pt will utilize external memory aids to recall recent events with 25% accuracy and maxA SLP Short Term Goal 3 (Week 1): Pt will complete functional problem solving tasks with maxA SLP Short Term Goal 4 (Week 1): Pt will sustain attention during functional cognitive tasks for 5 mins given maxA  Skilled Therapeutic Interventions: Pt seen for skilled ST with focus on cognitive communication goals, pt in wheelchair in room and agreeable to therapeutic tasks. Pt requires assistance placing hearing aids and requests to utilize bathroom. SLP facilitating transfers by providing mod A cues for safety and sequencing, pt with continent bladder void and transferred back to wheelchair and brought to ST room for tx. Pt endorses strong headache (nursing made aware) which impacts overall attention to tasks during session, environmental changes made to attempt to reduce head pain. Pt responsive to verbal cues throughout structured tasks and able to ID/fix errors on ~50% opportunities. SLP facilitating sustained attention during functional tasks by providing mod-max A cues for 4-5 minute increments. Pt with perseverative movements, sounds and questions throughout, easily redirected. Pt returned to room and left in wheelchair with alarm belt and telesitter intact and all needs within reach, cont ST POC.   Pain Pain Assessment Pain Scale: 0-10 Pain Score: 7  Pain Type: Acute pain Pain Location: Head Pain Intervention(s): RN made aware  Agitated Behavior Scale: TBI Observation Details Observation Environment:  CIR Start of observation period - Date: 07/11/23 Start of observation period - Time: 0930 End of observation period - Date: 07/11/23 End of observation period - Time: 1015 Agitated Behavior Scale (DO NOT LEAVE BLANKS) Short attention span, easy distractibility, inability to concentrate: Present to a moderate degree Impulsive, impatient, low tolerance for pain or frustration: Absent Uncooperative, resistant to care, demanding: Absent Violent and/or threatening violence toward people or property: Absent Explosive and/or unpredictable anger: Absent Rocking, rubbing, moaning, or other self-stimulating behavior: Absent Pulling at tubes, restraints, etc.: Absent Wandering from treatment areas: Absent Restlessness, pacing, excessive movement: Absent Repetitive behaviors, motor, and/or verbal: Present to a slight degree Rapid, loud, or excessive talking: Absent Sudden changes of mood: Absent Easily initiated or excessive crying and/or laughter: Absent Self-abusiveness, physical and/or verbal: Absent Agitated behavior scale total score: 17  Therapy/Group: Individual Therapy  Tacey Ruiz 07/11/2023, 10:01 AM

## 2023-07-11 NOTE — Progress Notes (Signed)
 Occupational Therapy TBI Note  Patient Details  Name: Peggy Ramirez MRN: [id number] Date of Birth: 12-17-1933  Session 1 Today's Date: 07/11/2023 OT Individual Time: 6295-2841 OT Individual Time Calculation (min): 60 min     Session 2 Today's Date: 07/11/2023 OT Individual Time: 3244-0102 OT Individual Time Calculation (min): 43 min    Short Term Goals: Week 1:  OT Short Term Goal 1 (Week 1): Pt will perform toileting with contact guard -performing 3/3 steps OT Short Term Goal 2 (Week 1): Pt will shower sit to stand with contact guard with min cues OT Short Term Goal 3 (Week 1): Pt will pick out clothing with min cues OT Short Term Goal 4 (Week 1): Pt will transfer to toilet with LRAD with supervision OT Short Term Goal 5 (Week 1): Pt will be oriented x3 50% of the day with mod cues  Skilled Therapeutic Interventions/Progress Updates:    Session 1 Pt received supine with no c/o pain, agreeable to OT session. She came to EOB with (S). Improvement in mobility this session but pt still requiring cueing for sequencing and initiation. Functional mobility into the bathroom with the RW with CGA. She completed 3/3 toileting tasks with mod cueing. Pt donned a new brief and washed up peri areas with min A. She returned to her w/c and then donned pants with min A, requiring assist to correctly orient pants. She completed oral care in standing at the sink- maintaining static balance for 5 minutes with close (S) ,leaning on sink slightly. She was then set up to eat breakfast. Supervision provided as well as intermittent cueing for bite size management and pacing. Occasionally she coughed with big sips of coffee. Pt was fully oriented but was slightly perseverative on getting her teeth in and getting her eye drops throughout session.  Left her a written note to provide an external cue for memory. Pt did complete toileting one more time- same assist as above. Pt left sitting up in the w/c with all needs  met, chair alarm set.   Session 2 Pt received in the w/c with c/o mild headache, shower used as intervention. Pt stood from the w/c with close (S) using the RW. She completed functional mobility into the bathroom and transferred to the toilet with CGA using RW. She required min cueing for sequencing toileting tasks but CGA physically. She transferred into the shower, using BSC as shower chair. She completed UB bathing seated with (S), LB with (S) as well using grab bar appropriately. She transferred back to w/c following shower with CGA. She donned a bra with assist to fasten posteriorly. She donned a shirt with min A, getting disoriented when LUE came out. Underwear and pants with min A. She was left sitting up with her son Peggy Ramirez present, all needs within reach.   Therapy Documentation Precautions:  Precautions Precautions: None Restrictions Weight Bearing Restrictions Per Provider Order: No  Agitated Behavior Scale: TBI Observation Details Observation Environment: CIR Start of observation period - Date: 07/11/23 Start of observation period - Time: 0730 End of observation period - Date: 07/11/23 End of observation period - Time: 0815 Agitated Behavior Scale (DO NOT LEAVE BLANKS) Short attention span, easy distractibility, inability to concentrate: Present to a moderate degree Impulsive, impatient, low tolerance for pain or frustration: Absent Uncooperative, resistant to care, demanding: Absent Violent and/or threatening violence toward people or property: Absent Explosive and/or unpredictable anger: Absent Rocking, rubbing, moaning, or other self-stimulating behavior: Absent Pulling at tubes, restraints, etc.: Absent  Wandering from treatment areas: Absent Restlessness, pacing, excessive movement: Absent Repetitive behaviors, motor, and/or verbal: Present to a slight degree Rapid, loud, or excessive talking: Absent Sudden changes of mood: Absent Easily initiated or excessive crying  and/or laughter: Absent Self-abusiveness, physical and/or verbal: Absent Agitated behavior scale total score: 17  Therapy/Group: Individual Therapy  [name] 07/11/2023, 8:16 AM

## 2023-07-12 DIAGNOSIS — K5901 Slow transit constipation: Secondary | ICD-10-CM

## 2023-07-12 DIAGNOSIS — I10 Essential (primary) hypertension: Secondary | ICD-10-CM

## 2023-07-12 DIAGNOSIS — S065X9D Traumatic subdural hemorrhage with loss of consciousness of unspecified duration, subsequent encounter: Secondary | ICD-10-CM

## 2023-07-12 MED ORDER — DOCUSATE SODIUM 100 MG PO CAPS
100.0000 mg | ORAL_CAPSULE | Freq: Two times a day (BID) | ORAL | Status: DC
  Administered 2023-07-12 – 2023-07-25 (×26): 100 mg via ORAL
  Filled 2023-07-12 (×26): qty 1

## 2023-07-12 NOTE — Plan of Care (Signed)
  Problem: RH SAFETY Goal: RH STG ADHERE TO SAFETY PRECAUTIONS W/ASSISTANCE/DEVICE Description: STG Adhere to Safety Precautions With supervision Assistance/Device. Outcome: Progressing

## 2023-07-12 NOTE — Progress Notes (Signed)
 PROGRESS NOTE   Subjective/Complaints:  Pt doing ok, thinks she slept ok. Pain overall managed, complains of HA but fioricet not yet given. Unsure of LBM, documented as either 3/12 or 3/13. Urinating ok. Denies any other complaints or concerns today although her history is potentially limited due to cognition.   ROS: Denies fevers, chills, CP, SOB, N/V, abdominal pain, diarrhea.   + Headache - ongoing  Objective:   No results found. No results for input(s): "WBC", "HGB", "HCT", "PLT" in the last 72 hours.  Recent Labs    07/11/23 0523  NA 138  K 4.0  CL 100  CO2 26  GLUCOSE 94  BUN 9  CREATININE 0.75  CALCIUM 10.4*    Intake/Output Summary (Last 24 hours) at 07/12/2023 0815 Last data filed at 07/12/2023 0100 Gross per 24 hour  Intake 100 ml  Output --  Net 100 ml        Physical Exam: Vital Signs Blood pressure 135/62, pulse 64, temperature 98.7 F (37.1 C), temperature source Oral, resp. rate 17, height 4\' 11"  (1.499 m), weight 41.7 kg, SpO2 98%.  General: No acute distress, resting in bed comfortably Mood and affect are appropriate, maybe slightly anxious Heart: Regular rate and rhythm no rubs murmurs or extra sounds appreciated Lungs: Clear to auscultation, breathing unlabored, no rales or wheezes Abdomen: Positive bowel sounds, soft nontender to palpation, nondistended Extremities: No clubbing, cyanosis, or edema  PRIOR EXAMS: Skin: C/D/I. No apparent lesions.  Small scalp laceration, clean dry, well-approximated.  Peripheral IV intact. MSK:      No apparent deformity.  Full passive range of motion all 4 extremities.       Neurologic exam:  Cognition: AAO to person, time and event.  States she is at [facility] but thought that she was in [facility] 2/3 objects after 5-minute delay  Language: Fluent, No substitutions or neoglisms. No dysarthria.  Insight: Fair to poor insight into current  condition.  Mood: Moderately labile, pleasant a motor strength is 4/5 bilateral deltoid by stress of grip hip flexor knee extensor ankle dorsiflexor and plantar flexor Tone is normal    Assessment/Plan: 1. Functional deficits which require 3+ hours per day of interdisciplinary therapy in a comprehensive inpatient rehab setting. Physiatrist is providing close team supervision and 24 hour management of active medical problems listed below. Physiatrist and rehab team continue to assess barriers to discharge/monitor patient progress toward functional and medical goals  Care Tool:  Bathing    Body parts bathed by patient: Right arm, Left arm, Left upper leg, Right lower leg, Chest, Abdomen, Front perineal area, Buttocks, Right upper leg, Left lower leg, Face         Bathing assist Assist Level: Minimal Assistance - Patient > 75%     Upper Body Dressing/Undressing Upper body dressing   What is the patient wearing?: Bra, Pull over shirt    Upper body assist Assist Level: Minimal Assistance - Patient > 75%    Lower Body Dressing/Undressing Lower body dressing      What is the patient wearing?: Pants, Incontinence brief     Lower body assist Assist for lower body dressing: Minimal Assistance -  Patient > 75%     Toileting Toileting    Toileting assist Assist for toileting: Total Assistance - Patient < 25%     Transfers Chair/bed transfer  Transfers assist  Chair/bed transfer activity did not occur: Safety/medical concerns  Chair/bed transfer assist level: Minimal Assistance - Patient > 75%     Locomotion Ambulation   Ambulation assist      Assist level: Minimal Assistance - Patient > 75% Assistive device: Walker-rolling Max distance: 150'   Walk 10 feet activity   Assist     Assist level: Minimal Assistance - Patient > 75% Assistive device: Walker-rolling   Walk 50 feet activity   Assist    Assist level: Minimal Assistance - Patient >  75% Assistive device: Walker-rolling    Walk 150 feet activity   Assist    Assist level: Minimal Assistance - Patient > 75% Assistive device: Walker-rolling    Walk 10 feet on uneven surface  activity   Assist     Assist level: Minimal Assistance - Patient > 75% Assistive device: Development worker, international aid     Assist Is the patient using a wheelchair?: Yes Type of Wheelchair: Manual    Wheelchair assist level: Dependent - Patient 0% Max wheelchair distance: 150    Wheelchair 50 feet with 2 turns activity    Assist        Assist Level: Dependent - Patient 0%   Wheelchair 150 feet activity     Assist      Assist Level: Dependent - Patient 0%   Blood pressure 135/62, pulse 64, temperature 98.7 F (37.1 C), temperature source Oral, resp. rate 17, height 4\' 11"  (1.499 m), weight 41.7 kg, SpO2 98%. Medical Problem List and Plan: 1. Functional deficits secondary to traumatic SDH with scalp laceration stapled in the ED as well as history Sjorgens syndrome/SLE             -patient may shower             -ELOS/Goals: 10-14 days S            -Stable to continue CIR  2.  Antithrombotics: -DVT/anticoagulation:  Mechanical: Antiembolism stockings, thigh (TED hose) Bilateral lower extremities             -antiplatelet therapy: N/A  3. Pain Management: Tylenol as needed  -No complaints of pain, headaches. 3-14: Complaining of severe right-sided headaches, 10 out of 10, not responsive to Tylenol.  Has documented history of allergy to Topamax and with elevated LFTs, will not schedule max dose Tylenol.  Switched to Fioricet 1-2 tabs every 6 hours as needed. -07/12/23 HA this morning, but not given fioricet yet. Asked nurse to give this. No other reports afterwards from nursing; monitor  4. Mood/Behavior/Sleep: Trazodone 75 mg nightly.  Provide emotional support             -antipsychotic agents: Seroquel nightly as needed  -3-13: Add as needed melatonin. Add  sleep log 3/14: Sleeping well overnight--did have documented use of Seroquel, no events documented overnight, spotted well.  Continue current medications.  5. Neuropsych/cognition: This patient is not capable of making decisions on her own behalf. -3-14: Subarachnoid hemorrhage 3-8, should be finishing 7 days of Keppra today.  Hopefully will improve once Keppra is off.  Continue TeleSitter for now  6. Skin/Wound Care: Routine skin checks, should be ready for staple removal on 3/17  7. Fluids/Electrolytes/Nutrition: Routine in and outs with follow-up chemistries, continue vitamins and supplements  -07/12/23  BMP yesterday basically WNL except Ca 10.4; monitor routine labs  8.  Seizure prophylaxis.  Completed 7-day course of Keppra.   9.  History of CVA.  Plavix currently on hold due to SDH.   10.  Hyperlipidemia.  Continue Zocor 20mg  daily   11.  Hypertension.  Restart Norvasc 2.5mg  daily.    - monitor with therapies--tolerating well  -07/12/23 BPs good, monitor Vitals:   07/08/23 1923 07/09/23 0526 07/09/23 1949 07/10/23 0639  BP: (!) 153/64 (!) 140/75 (!) 147/74 136/60   07/10/23 0651 07/10/23 1358 07/10/23 1936 07/11/23 0526  BP: 125/62 127/61 (!) 147/66 126/66   07/11/23 1423 07/11/23 1945 07/12/23 0527  BP: 113/70 (!) 159/69 135/62     12.  Hyponatremia.  continue sodium chloride tablets.  Follow-up chemistries - resolved on admission labs; reduce chloride tabs form TID to 1 g BID; recheck Saturday for further titration -07/12/23 labs yesterday with appropriate NaCl levels, continue dose for now    Latest Ref Rng & Units 07/11/2023    5:23 AM 07/09/2023    6:08 AM 07/08/2023    6:53 AM  BMP  Glucose 70 - 99 mg/dL 94  96  89   BUN 8 - 23 mg/dL 9  9  11    Creatinine 0.44 - 1.00 mg/dL 1.61  0.96  0.45   Sodium 135 - 145 mmol/L 138  135  130   Potassium 3.5 - 5.1 mmol/L 4.0  3.8  3.5   Chloride 98 - 111 mmol/L 100  102  100   CO2 22 - 32 mmol/L 26  27  24    Calcium 8.9 - 10.3  mg/dL 40.9  81.1  91.4     13.  History of membranous glomerulonephritis.  Patient not on immunosuppression followed by Dr. Clelia Schaumann   - BUN and CR stable  14. Transaminitis.   - reduce PRN tylenol dose to 325 mg   - recheck Monday-- ordered  15. GERD: continue protonix 40mg  daily  16. Constipation: -07/12/23 no BM in 3-4 days, will increase colace to BID, monitor, if no BM by tomorrow, may need sorbitol or some other stimulant laxative.    LOS: 4 days A FACE TO FACE EVALUATION WAS PERFORMED  [id number] Linley Moskal 07/12/2023, 8:15 AM

## 2023-07-12 NOTE — Plan of Care (Signed)
  Problem: RH SKIN INTEGRITY Goal: RH STG SKIN FREE OF INFECTION/BREAKDOWN Description: Manage skin free of infection/ breakdown with supervision Assistance. Outcome: Progressing

## 2023-07-13 ENCOUNTER — Inpatient Hospital Stay (HOSPITAL_COMMUNITY)

## 2023-07-13 DIAGNOSIS — S065X9S Traumatic subdural hemorrhage with loss of consciousness of unspecified duration, sequela: Secondary | ICD-10-CM | POA: Diagnosis not present

## 2023-07-13 LAB — COMPREHENSIVE METABOLIC PANEL
ALT: 67 U/L — ABNORMAL HIGH (ref 0–44)
AST: 34 U/L (ref 15–41)
Albumin: 2.8 g/dL — ABNORMAL LOW (ref 3.5–5.0)
Alkaline Phosphatase: 78 U/L (ref 38–126)
Anion gap: 3 — ABNORMAL LOW (ref 5–15)
BUN: 15 mg/dL (ref 8–23)
CO2: 29 mmol/L (ref 22–32)
Calcium: 10 mg/dL (ref 8.9–10.3)
Chloride: 101 mmol/L (ref 98–111)
Creatinine, Ser: 0.74 mg/dL (ref 0.44–1.00)
GFR, Estimated: >60 mL/min (ref >60)
Glucose, Bld: 93 mg/dL (ref 70–99)
Potassium: 4.1 mmol/L (ref 3.5–5.1)
Sodium: 133 mmol/L — ABNORMAL LOW (ref 135–145)
Total Bilirubin: 0.6 mg/dL (ref 0.0–1.2)
Total Protein: 5.9 g/dL — ABNORMAL LOW (ref 6.5–8.1)

## 2023-07-13 LAB — CBC
HCT: 33.7 % — ABNORMAL LOW (ref 36.0–46.0)
Hemoglobin: 11.7 g/dL — ABNORMAL LOW (ref 12.0–15.0)
MCH: 32 pg (ref 26.0–34.0)
MCHC: 34.7 g/dL (ref 30.0–36.0)
MCV: 92.1 fL (ref 80.0–100.0)
Platelets: 318 10*3/uL (ref 150–400)
RBC: 3.66 MIL/uL — ABNORMAL LOW (ref 3.87–5.11)
RDW: 12 % (ref 11.5–15.5)
WBC: 5.8 10*3/uL (ref 4.0–10.5)
nRBC: 0 % (ref 0.0–0.2)

## 2023-07-13 MED ORDER — AMLODIPINE BESYLATE 5 MG PO TABS
5.0000 mg | ORAL_TABLET | Freq: Every day | ORAL | Status: DC
  Administered 2023-07-14: 5 mg via ORAL
  Filled 2023-07-13: qty 1

## 2023-07-13 MED ORDER — SENNOSIDES-DOCUSATE SODIUM 8.6-50 MG PO TABS
1.0000 | ORAL_TABLET | Freq: Two times a day (BID) | ORAL | Status: DC
  Administered 2023-07-13 – 2023-07-25 (×25): 1 via ORAL
  Filled 2023-07-13 (×25): qty 1

## 2023-07-13 NOTE — Progress Notes (Signed)
 Occupational Therapy TBI Note  Patient Details  Name: Peggy Ramirez MRN: [id number] Date of Birth: 27-Jun-1933  Today's Date: 07/13/2023 OT Individual Time: 7829-5621 OT Individual Time Calculation (min): 42 min  and Today's Date: 07/13/2023 OT Missed Time: 30 Minutes Missed Time Reason: CT/MRI   Short Term Goals: Week 1:  OT Short Term Goal 1 (Week 1): Pt will perform toileting with contact guard -performing 3/3 steps OT Short Term Goal 2 (Week 1): Pt will shower sit to stand with contact guard with min cues OT Short Term Goal 3 (Week 1): Pt will pick out clothing with min cues OT Short Term Goal 4 (Week 1): Pt will transfer to toilet with LRAD with supervision OT Short Term Goal 5 (Week 1): Pt will be oriented x3 50% of the day with mod cues  Skilled Therapeutic Interventions/Progress Updates:    Pt received leaving the bathroom with NT who reported pt has tried to pee several times this morning with little output. Pt requesting to take shower. She completed functional mobility into the bathroom with min A, shuffling gait. She required mod cueing to sequence transfer into the shower. When turning pt left her left hand behind gripping the RW. She was unable to remove it with verbal cueing, requiring OT assist to remove hand. She then demonstrated worse/new proprioceptive deficits with the L hand and had a difficult time getting it onto the grab bar like she usually does. When questioned how she was feeling pt stated "swimmy headed and drunk". OT determined it was no longer safe to initiate a shower and assisted pt back to her w/c. Pt keeping her eyes closed more than usual. Obtained BP x2- 165/78 L. 160/72 R. Notified RN, MD and PA of LUE changes. PA in room to assess. Performed brief neuro screen without substantial R vs L differences other than aforementioned L ataxia. She was assisted back to bed and left supine with her son present, awaiting transport to stat CT.   30 min missed.    Therapy Documentation Precautions:  Precautions Precautions: None Restrictions Weight Bearing Restrictions Per Provider Order: No  Therapy/Group: Individual Therapy  [name] 07/13/2023, 8:40 AM

## 2023-07-13 NOTE — Progress Notes (Signed)
 PROGRESS NOTE   Subjective/Complaints:  Mildly hypertensive, 150s over 70s.  Otherwise vital stable. Single episode of urinary incontinence this a.m.; otherwise mostly continent of bowel and bladder Hyponatremic this a.m. 133, LFTs downtrending.  Other labs stable.  Patient complaining of needing to go to the bathroom in bed.  Does not remember who provider is, denies any needs at this time.  Denies headache at this time.  ROS: Denies fevers, chills, CP, SOB, N/V, abdominal pain, diarrhea.   + Headache -not currently on exam  Objective:   No results found. Recent Labs    07/13/23 0537  WBC 5.8  HGB 11.7*  HCT 33.7*  PLT 318    Recent Labs    07/11/23 0523 07/13/23 0537  NA 138 133*  K 4.0 4.1  CL 100 101  CO2 26 29  GLUCOSE 94 93  BUN 9 15  CREATININE 0.75 0.74  CALCIUM 10.4* 10.0    Intake/Output Summary (Last 24 hours) at 07/13/2023 0801 Last data filed at 07/12/2023 1745 Gross per 24 hour  Intake 436 ml  Output --  Net 436 ml        Physical Exam: Vital Signs Blood pressure (!) 157/72, pulse 65, temperature 97.8 F (36.6 C), resp. rate 16, height 4\' 11"  (1.499 m), weight 41.7 kg, SpO2 97%.  General: No acute distress, laying in bed. Heart: Regular rate and rhythm ; mild systolic murmur Lungs: Clear to auscultation, breathing unlabored, no rales or wheezes Abdomen: Positive bowel sounds, soft nontender to palpation, nondistended Extremities: No clubbing, cyanosis, or edema   Skin: C/D/I. No apparent lesions.   Small scalp laceration-healing.    MSK:      No apparent deformity.  Full passive range of motion all 4 extremities.       Neurologic exam:  Cognition: AAO to person, place, and partially time and event.   + Moderate memory deficits Insight: Fair to poor insight into current condition.  Mood: Moderately labile, pleasant  Motor: Antigravity against resistance in all 4 extremities, no  apparent deficits.   Sensory intact.    Assessment/Plan: 1. Functional deficits which require 3+ hours per day of interdisciplinary therapy in a comprehensive inpatient rehab setting. Physiatrist is providing close team supervision and 24 hour management of active medical problems listed below. Physiatrist and rehab team continue to assess barriers to discharge/monitor patient progress toward functional and medical goals  Care Tool:  Bathing    Body parts bathed by patient: Right arm, Left arm, Left upper leg, Right lower leg, Chest, Abdomen, Front perineal area, Buttocks, Right upper leg, Left lower leg, Face         Bathing assist Assist Level: Minimal Assistance - Patient > 75%     Upper Body Dressing/Undressing Upper body dressing   What is the patient wearing?: Bra, Pull over shirt    Upper body assist Assist Level: Minimal Assistance - Patient > 75%    Lower Body Dressing/Undressing Lower body dressing      What is the patient wearing?: Pants, Incontinence brief     Lower body assist Assist for lower body dressing: Minimal Assistance - Patient > 75%     Toileting  Toileting    Toileting assist Assist for toileting: Total Assistance - Patient < 25%     Transfers Chair/bed transfer  Transfers assist  Chair/bed transfer activity did not occur: Safety/medical concerns  Chair/bed transfer assist level: Minimal Assistance - Patient > 75%     Locomotion Ambulation   Ambulation assist      Assist level: Minimal Assistance - Patient > 75% Assistive device: Walker-rolling Max distance: 150'   Walk 10 feet activity   Assist     Assist level: Minimal Assistance - Patient > 75% Assistive device: Walker-rolling   Walk 50 feet activity   Assist    Assist level: Minimal Assistance - Patient > 75% Assistive device: Walker-rolling    Walk 150 feet activity   Assist    Assist level: Minimal Assistance - Patient > 75% Assistive device:  Walker-rolling    Walk 10 feet on uneven surface  activity   Assist     Assist level: Minimal Assistance - Patient > 75% Assistive device: Development worker, international aid     Assist Is the patient using a wheelchair?: Yes Type of Wheelchair: Manual    Wheelchair assist level: Dependent - Patient 0% Max wheelchair distance: 150    Wheelchair 50 feet with 2 turns activity    Assist        Assist Level: Dependent - Patient 0%   Wheelchair 150 feet activity     Assist      Assist Level: Dependent - Patient 0%   Blood pressure (!) 157/72, pulse 65, temperature 97.8 F (36.6 C), resp. rate 16, height 4\' 11"  (1.499 m), weight 41.7 kg, SpO2 97%. Medical Problem List and Plan: 1. Functional deficits secondary to traumatic SDH with scalp laceration stapled in the ED as well as history Sjorgens syndrome/SLE             -patient may shower             -ELOS/Goals: 10-14 days S            -Stable to continue CIR  2.  Antithrombotics: -DVT/anticoagulation:  Mechanical: Antiembolism stockings, thigh (TED hose) Bilateral lower extremities             -antiplatelet therapy: N/A  3. Pain Management: Tylenol as needed  -No complaints of pain, headaches. 3-14: Complaining of severe right-sided headaches, 10 out of 10, not responsive to Tylenol.  Has documented history of allergy to Topamax and with elevated LFTs, will not schedule max dose Tylenol.  Switched to Fioricet 1-2 tabs every 6 hours as needed. -07/12/23 HA this morning, but not given fioricet yet. Asked nurse to give this. No other reports afterwards from nursing; monitor  4. Mood/Behavior/Sleep: Trazodone 75 mg nightly.  Provide emotional support             -antipsychotic agents: Seroquel nightly as needed  -3-13: Add as needed melatonin. Add sleep log 3/14: Sleeping well overnight--did have documented use of Seroquel, no events documented overnight, spotted well.  Continue current medications.  5.  Neuropsych/cognition: This patient is not capable of making decisions on her own behalf. -3-14: Subarachnoid hemorrhage 3-8, should be finishing 7 days of Keppra today.  Hopefully will improve once Keppra is off.  Continue TeleSitter for now  6. Skin/Wound Care: Routine skin checks, should be ready for staple removal on 3/17  7. Fluids/Electrolytes/Nutrition: Routine in and outs with follow-up chemistries, continue vitamins and supplements  -07/12/23 BMP yesterday basically WNL except Ca 10.4; monitor routine labs  8.  Seizure prophylaxis.  Completed 7-day course of Keppra.   9.  History of CVA.  Plavix currently on hold due to SDH.   10.  Hyperlipidemia.  Continue Zocor 20mg  daily   11.  Hypertension.  Restart Norvasc 2.5mg  daily.    - monitor with therapies--tolerating well  -07/12/23 BPs good, monitor  3-17: Some intermittent hypertension overnight, increase Norvasc to 5 mg daily Vitals:   07/09/23 1949 07/10/23 0639 07/10/23 0651 07/10/23 1358  BP: (!) 147/74 136/60 125/62 127/61   07/10/23 1936 07/11/23 0526 07/11/23 1423 07/11/23 1945  BP: (!) 147/66 126/66 113/70 (!) 159/69   07/12/23 0527 07/12/23 1321 07/12/23 1806 07/13/23 0358  BP: 135/62 137/72 (!) 151/73 (!) 157/72     12.  Hyponatremia.  continue sodium chloride tablets.  Follow-up chemistries - resolved on admission labs; reduce chloride tabs form TID to 1 g BID; recheck Saturday for further titration -07/12/23 labs yesterday with appropriate NaCl levels, continue dose for now . 3/ 17: Mild hyponatremia 133 this a.m.; placed 1200 cc fluid restriction and continue salt tabs.  Repeat in 2 to 3 days    Latest Ref Rng & Units 07/13/2023    5:37 AM 07/11/2023    5:23 AM 07/09/2023    6:08 AM  BMP  Glucose 70 - 99 mg/dL 93  94  96   BUN 8 - 23 mg/dL 15  9  9    Creatinine 0.44 - 1.00 mg/dL 8.41  3.24  4.01   Sodium 135 - 145 mmol/L 133  138  135   Potassium 3.5 - 5.1 mmol/L 4.1  4.0  3.8   Chloride 98 - 111 mmol/L 101  100   102   CO2 22 - 32 mmol/L 29  26  27    Calcium 8.9 - 10.3 mg/dL 02.7  25.3  66.4     13.  History of membranous glomerulonephritis.  Patient not on immunosuppression followed by Dr. Clelia Schaumann   - BUN and CR stable  14. Transaminitis.   - reduce PRN tylenol dose to 325 mg   - recheck Monday-- ordered  15. GERD: continue protonix 40mg  daily  16. Constipation: -07/12/23 no BM in 3-4 days, will increase colace to BID, monitor, if no BM by tomorrow, may need sorbitol or some other stimulant laxative.  3-17: No recorded bowel movement, add Senokot S1 tablet twice daily and give as needed MiraLAX today   LOS: 5 days A FACE TO FACE EVALUATION WAS PERFORMED  Angelina Sheriff 07/13/2023, 8:01 AM

## 2023-07-13 NOTE — Progress Notes (Signed)
 Physical Therapy Session Note  Patient Details  Name: Peggy Ramirez MRN: [id number] Date of Birth: Jan 09, 1934  Today's Date: 07/13/2023 PT Individual Time: 4098-1191 PT Individual Time Calculation (min): 41 min   Today's Date: 07/13/2023 PT Individual Time: 4782-9562 PT Individual Time Calculation (min): 40 min   Short Term Goals: Week 1:  PT Short Term Goal 1 (Week 1): Pt will complete bed mobility with CGA. PT Short Term Goal 2 (Week 1): Pt will complete sit to stand transfer with CGA. PT Short Term Goal 3 (Week 1): Pt will complete bed to chair transfer with minA. PT Short Term Goal 4 (Week 1): Pt will ambulate x150' with CGA and LRAD.  Skilled Therapeutic Interventions/Progress Updates:     1st Session: Pt received seated at edge of bed and agrees to therapy. Reports pain in head. PT provides rest breaks as needed and RN provides pain medicine to address headache. Pt reports needing to use toilet. Pt performs ambulatory transfer to toilet with RW and minA, with cues for safe AD management, sequencing, and use of grab bars for transfer. Pt then completes toileting and pericare, and requires minA for trunk support as she pulls up pants. Pt ambulates to sink to wash hands with cues for safe positioning. WC transport to gym. Following rest break, pt stands and ambulates x175' without AD to challenge balance and provide intensive gait training. Pt requires minA at trunk to promote stability, with cues for upright gaze to improve posture and balance, as well as increasing stride length and trunk rotation to decrease risk for falls. Pt takes seated rest break prior to ambulating x100' back to room with RW and minA. Left seated in WC with alarm intact and all needs within reach.   2nd Session: Pt received seated in [facility] and agrees to therapy. No complaint of pain. Pt does verbalize wanting to urinate. P tambultes to toilet with RW and minA< with cues for use of grab bars and PT assisting to pull  pants down. Pt urinates and performs pericare with cues to utilize LUE to assist with tearing toilet paper. Pt ambulates to sink with minA and RW, with cues for upright posture to improve balance, and reaching outside base of support to use soap to wash hands. Pt then ambulates to day room with RW and minA, with cues for safe AD management. Pt then completes Nustep for endurance training and reciprocal coordination training. Pt completes 2x5:00 at workload of 4 with average steps per minute ~49. PT provides cues for hand and foot placement and completing full available ROM. Following, pt ambulates x100' with RW and requests to urinate again. Pt voids small amount of urine and performs pericare vigorously. Pt ambulates back to WC with minA and RW, with cues for hand placement for safety. Left seated in WC with alarm intact and all needs within reach.    Therapy Documentation Precautions:  Precautions Precautions: None Restrictions Weight Bearing Restrictions Per Provider Order: No   Therapy/Group: Individual Therapy  Beau Fanny, PT, DPT 07/13/2023, 4:04 PM

## 2023-07-13 NOTE — Progress Notes (Signed)
 Met with patient and daughter to review current situation, team conference and plan of care. Reviewed behavioral plan for the patient with daughter. Daughter was very supportive of the plan. Continue to follow along to provide educational needs in   preparation for discharge.

## 2023-07-13 NOTE — Progress Notes (Signed)
 Speech Language Pathology TBI Note  Patient Details  Name: Peggy Ramirez MRN: [id number] Date of Birth: Mar 31, 1934  Today's Date: 07/13/2023 SLP Individual Time: 0930-1030 SLP Individual Time Calculation (min): 60 min  Short Term Goals: Week 1: SLP Short Term Goal 1 (Week 1): Pt will utilize external memory aids as needed to recall orientation information given maxA SLP Short Term Goal 2 (Week 1): Pt will utilize external memory aids to recall recent events with 25% accuracy and maxA SLP Short Term Goal 3 (Week 1): Pt will complete functional problem solving tasks with maxA SLP Short Term Goal 4 (Week 1): Pt will sustain attention during functional cognitive tasks for 5 mins given maxA  Skilled Therapeutic Interventions:   Pt greeted in her room and SLP facilitated functional tx tasks targeting cognition. She was awake/alert upon SLP arrival, requesting assistance to the bathroom. She benefited from hand held assist to the bathroom and minA overall for toileting. Attempted to void though unsuccessful. She benefited from [facility] for sequencing and problem solving during personal hygiene at the sink. She was perseverative on getting dressed, so SLP assisted pt with upper and lower body dressing. She required hand held assist to walk to the cabinet and gather her desired clothes. She required maxA for lower body dressing d/t problem solving deficits re sequencing and shoe interference. Only set up and s cues required for upper body dressing. She requested assistance to the restroom again and was continent of bladder this time. Upon return to her chair, she c/o headache. Nursing notified and administered pain medication. SLP provided modA visual/verbal cues during orientation review via calendar. She also provided errorless learning re daily schedule and times for eye drops d/t perseverations. At the end of tx tasks, she was left in her chair with the alarm set and call light within reach. Recommend cont  ST per POC.   Pain Pain Assessment Pain Scale: PAINAD Pain Intervention(s): Medication (See eMAR) PAINAD (Pain Assessment in Advanced Dementia) Breathing: occasional labored breathing, short period of hyperventilation Negative Vocalization: occasional moan/groan, low speech, negative/disapproving quality Facial Expression: sad, frightened, frown Body Language: tense, distressed pacing, fidgeting Consolability: distracted or reassured by voice/touch PAINAD Score: 5  Agitated Behavior Scale: TBI Observation Details Observation Environment: CIR Start of observation period - Date: 07/13/23 Start of observation period - Time: 0930 End of observation period - Date: 07/13/23 End of observation period - Time: 1030 Agitated Behavior Scale (DO NOT LEAVE BLANKS) Short attention span, easy distractibility, inability to concentrate: Present to a moderate degree Impulsive, impatient, low tolerance for pain or frustration: Absent Uncooperative, resistant to care, demanding: Absent Violent and/or threatening violence toward people or property: Absent Explosive and/or unpredictable anger: Absent Rocking, rubbing, moaning, or other self-stimulating behavior: Absent Pulling at tubes, restraints, etc.: Absent Wandering from treatment areas: Absent Restlessness, pacing, excessive movement: Absent Repetitive behaviors, motor, and/or verbal: Present to a slight degree Rapid, loud, or excessive talking: Absent Sudden changes of mood: Absent Easily initiated or excessive crying and/or laughter: Absent Self-abusiveness, physical and/or verbal: Absent Agitated behavior scale total score: 17  Therapy/Group: Individual Therapy  Peggy Ramirez 07/13/2023, 10:29 AM

## 2023-07-14 DIAGNOSIS — S065X9S Traumatic subdural hemorrhage with loss of consciousness of unspecified duration, sequela: Secondary | ICD-10-CM

## 2023-07-14 LAB — URINALYSIS, W/ REFLEX TO CULTURE (INFECTION SUSPECTED)
Bilirubin Urine: NEGATIVE
Glucose, UA: NEGATIVE mg/dL
Hgb urine dipstick: NEGATIVE
Ketones, ur: NEGATIVE mg/dL
Nitrite: POSITIVE — AB
Protein, ur: NEGATIVE mg/dL
Specific Gravity, Urine: 1.008 (ref 1.005–1.030)
WBC, UA: >50 WBC/hpf (ref 0–5)
pH: 7 (ref 5.0–8.0)

## 2023-07-14 MED ORDER — ALPRAZOLAM 0.25 MG PO TABS: 0.2500 mg | ORAL_TABLET | Freq: Every day | ORAL | Status: DC

## 2023-07-14 MED ORDER — DIPHENHYDRAMINE HCL 25 MG PO CAPS
25.0000 mg | ORAL_CAPSULE | Freq: Three times a day (TID) | ORAL | Status: DC | PRN
  Administered 2023-07-19: 25 mg via ORAL
  Filled 2023-07-14: qty 1

## 2023-07-14 MED ORDER — TOPIRAMATE 25 MG PO TABS
25.0000 mg | ORAL_TABLET | Freq: Two times a day (BID) | ORAL | Status: DC
  Administered 2023-07-14 – 2023-07-15 (×3): 25 mg via ORAL
  Filled 2023-07-14 (×4): qty 1

## 2023-07-14 MED ORDER — METOPROLOL TARTRATE 12.5 MG HALF TABLET: 12.5000 mg | ORAL_TABLET | Freq: Two times a day (BID) | ORAL | Status: DC

## 2023-07-14 MED ORDER — SORBITOL 70 % SOLN: 30.0000 mL | Freq: Once | Status: DC

## 2023-07-14 MED ORDER — ALPRAZOLAM 0.25 MG PO TABS
0.2500 mg | ORAL_TABLET | Freq: Every evening | ORAL | Status: DC | PRN
  Administered 2023-07-14 – 2023-07-21 (×6): 0.25 mg via ORAL
  Filled 2023-07-14 (×7): qty 1

## 2023-07-14 MED ORDER — DIPHENHYDRAMINE HCL 50 MG/ML IJ SOLN: 25.0000 mg | Freq: Three times a day (TID) | INTRAMUSCULAR | Status: DC | PRN

## 2023-07-14 MED ORDER — LOSARTAN POTASSIUM 25 MG PO TABS
25.0000 mg | ORAL_TABLET | Freq: Every day | ORAL | Status: DC
  Administered 2023-07-15: 25 mg via ORAL
  Filled 2023-07-14 (×2): qty 1

## 2023-07-14 MED ORDER — CEPHALEXIN 250 MG PO CAPS
500.0000 mg | ORAL_CAPSULE | Freq: Two times a day (BID) | ORAL | Status: DC
  Administered 2023-07-14 – 2023-07-15 (×3): 500 mg via ORAL
  Filled 2023-07-14 (×4): qty 2

## 2023-07-14 NOTE — Patient Care Conference (Signed)
 Inpatient RehabilitationTeam Conference and Plan of Care Update Date: 07/14/2023   Time: 1016 am    Patient Name: Peggy Ramirez      Medical Record Number: [id number]  Date of Birth: December 28, 1933 Sex: Female         Room/Bed: 4W15C/4W15C-01 Payor Info: Payor: Advertising copywriter MEDICARE / Plan: [facility] [place] MEDICARE / Product Type: *No Product type* /    Admit Date/Time:  07/08/2023  7:07 PM  Primary Diagnosis:  Traumatic subdural hematoma [facility])  Hospital Problems: Principal Problem:   Traumatic subdural hematoma [facility])    Expected Discharge Date: Expected Discharge Date: 07/16/23  Team Members Present: Physician leading conference: Dr. Elijah Birk Social Worker Present: Cecile Sheerer, LCSWA Nurse Present: Konrad Dolores, RN PT Present: Malachi Pro, PT OT Present: Jake Shark, OT SLP Present: Other (comment) Alvera Novel SLP) PPS Coordinator present : Fae Pippin, SLP     Current Status/Progress Goal Weekly Team Focus  Bowel/Bladder   Cont of B/B PT having incresed frequency   Q2 tolieting or when Pt calls   remain cont to BB    Swallow/Nutrition/ Hydration   regular/thin           ADL's   Close to baseline physically- requires CGA for ADLs and CGA- (S) for ADL transfers. Slight L inattention/ataxia present. Very anxious. Able to cognitively sequence through ADLs but very poor memory/perseverative. Would likely do best in her home routine.   Supervision   ADLs, transfers, cognitive retraining, d/c planning    Mobility   supervision bed mobliity, CGA transfers, CGA/minA gait with RW x300'   Supervision  Family ed, DC prep    Communication   [facility]            Safety/Cognition/ Behavioral Observations  maxA for problem solving, sequencing, and attention; totalA for memory   maxA   functional problem solving, orientation, visual aids for memory    Pain   Pt c/o of headace   Pt headache pain will be resolved   assess pain q shift and PRN    Skin   Skin  tact   Skin reamins intact without breakdown         Discharge Planning:  Pt will discharge to home and [name], as well as other children are willing to assist. SW will confirm there are no barriers to discharge.   Team Discussion: Patient admitted post fall with traumatic SDH and scalp laceration Patient with urinary tract infection,  hyponatremia, severe headaches: medications adjusted by MD. Patient limited by confusion,ataxia, anxiety, poor memory, poor carry over ,poor appetite and perseveration.   Patient on target to meet rehab goals: yes, Patient requires contact guard assist with ADLs. Patient requires contact guard assist to supervision with transfers. Patient requires contact guard assist - supervision using RW with ambulation. Over all goals for discharge set at supervision.  *See Care Plan and progress notes for long and short-term goals.   Revisions to Treatment Plan:  Telesitter Behavior Plan initiated   Teaching Needs: Safety, medications, transfers, toileting, etc.   Current Barriers to Discharge: Decreased caregiver support, Incontinence, and Behavior  Possible Resolutions to Barriers: Family Education Home health follow-up DME: walker     Medical Summary Current Status: medically complicated by severe headaches, memory/cognitive deficits, hypertension, poor PO intakes, hyponatremia  Barriers to Discharge: Behavior/Mood;Electrolyte abnormality;Inadequate Nutritional Intake;Uncontrolled Hypertension;Uncontrolled Pain;Medical stability;Self-care education   Possible Resolutions to [employer] Focus: promotion of sleep/wake cycle, pain medication adjustment for headaches, encourage PO intakes, adjust BP regimen,  monitor sodium   Continued Need for Acute Rehabilitation Level of Care: The patient requires daily medical management by a physician with specialized training in physical medicine and rehabilitation for the following reasons: Direction of a  multidisciplinary physical rehabilitation program to maximize functional independence : Yes Medical management of patient stability for increased activity during participation in an intensive rehabilitation regime.: Yes Analysis of laboratory values and/or radiology reports with any subsequent need for medication adjustment and/or medical intervention. : Yes   I attest that I was present, lead the team conference, and concur with the assessment and plan of the team.   Gwenyth Allegra 07/15/2023, 7:50 AM

## 2023-07-14 NOTE — Progress Notes (Signed)
 PROGRESS NOTE   Subjective/Complaints:  Restless overnight, mildly hypertensive.  Ongoing left upper extremity neglect mildly worse than intake, with CT head yesterday negative for worsening SDH. Increasing urinary frequency per nursing.  Family reports patient always has urinary frequency and some urgency, but this is off from baseline.  Patient denies any dysuria, but does states she has some lower abdominal pain.  Headache ongoing, bilateral, intermittent, and poorly controlled with Fioricet although this does help more than the Tylenol.  Patient denies any known side effects to topiramate, family believes only medication side effects are from sulfa drugs.  Allergy listed is unknown in chart.  Family states that, at home, patient takes Xanax 0.25 mg with dinner for restlessness in the evenings.  She then takes trazodone 75 mg at night, which is her current dose.  Continent, medium BM 3/17.   ROS: Denies fevers, chills, CP, SOB, diarrhea.   + Headache -ongoing, bilateral, severe + Lower abdominal pain/nausea  Objective:   CT HEAD WO CONTRAST ( ) Result Date: 07/13/2023 CLINICAL DATA:  Subdural hematoma with worsening neuro deficits. EXAM: CT HEAD WITHOUT CONTRAST TECHNIQUE: Contiguous axial images were obtained from the base of the skull through the vertex without intravenous contrast. RADIATION DOSE REDUCTION: This exam was performed according to the departmental dose-optimization program which includes automated exposure control, adjustment of the mA and/or kV according to patient size and/or use of iterative reconstruction technique. COMPARISON:  CT head 07/04/2023. FINDINGS: Brain: Redemonstrated subdural hematoma over the right cerebral convexity which measures up to 1.1 cm in maximum thickness, previously 1.2 cm. Overall there is decreased attenuation of the blood products compatible with evolving acute to subacute hemorrhage.  There is a significant decrease in blood products over the right temporal lobe compared to prior. Decreased scattered subarachnoid hemorrhage compared to 07/04/2023. Small focus of residual subarachnoid hemorrhage noted over the anterior left frontal lobe and right frontoparietal lobes. No new or enlarging intracranial hemorrhage. Similar mild mass effect and sulcal effacement in the right cerebral hemisphere. No significant midline shift. The basilar cisterns are patent. Ventricles: Prominence of the ventricles suggesting underlying parenchymal volume loss. Vascular: Atherosclerotic calcifications of the carotid siphons and intracranial vertebral arteries. No hyperdense vessel. Skull: No acute or aggressive finding. Orbits: Orbits are symmetric. Sinuses: Focal mucosal thickening in the posterior right maxillary sinus. Other: Trace fluid in the mastoid air cells. Skin staples in the right parietal scalp. IMPRESSION: Similar maximum size of right cerebral convexity subdural hematoma compared to 07/04/2023. Slightly decreased component over the right temporal lobe. Similar mass effect. No significant midline shift. Scattered subarachnoid hemorrhage is significantly decreased. Electronically Signed   By: Emily Filbert M.D.   On: 07/13/2023 13:39   Recent Labs    07/13/23 0537  WBC 5.8  HGB 11.7*  HCT 33.7*  PLT 318    Recent Labs    07/13/23 0537  NA 133*  K 4.1  CL 101  CO2 29  GLUCOSE 93  BUN 15  CREATININE 0.74  CALCIUM 10.0    Intake/Output Summary (Last 24 hours) at 07/14/2023 1011 Last data filed at 07/13/2023 1300 Gross per 24 hour  Intake 200 ml  Output --  Net 200 ml        Physical Exam: Vital Signs Blood pressure (!) 158/64, pulse 68, temperature 98.8 F (37.1 C), resp. rate 18, height 4\' 11"  (1.499 m), weight 41.7 kg, SpO2 95%.  General: No acute distress, sitting up at bedside. Heart: Regular rate and rhythm ; mild systolic murmur Lungs: Clear to auscultation,  breathing unlabored, no rales or wheezes Abdomen: Positive bowel sounds, soft nontender to palpation, nondistended Extremities: No clubbing, cyanosis, or edema Skin: C/D/I. No apparent lesions.   Small scalp laceration-healing.    MSK:      No apparent deformity.  Full passive range of motion all 4 extremities.       Neurologic exam:  Cognition: AAO to person, place, and partially time and event.   + Moderate memory deficits + Mild left hemineglect, can activate with encouragement but forgetful limb is up in space. Insight:  poor insight into current condition.  Mood: Moderately labile, pleasant  Motor: Antigravity against resistance in all 4 extremities, mild left upper and lower extremity weakness 5-/5.  + Left upper extremity ataxia Sensory intact.    Assessment/Plan: 1. Functional deficits which require 3+ hours per day of interdisciplinary therapy in a comprehensive inpatient rehab setting. Physiatrist is providing close team supervision and 24 hour management of active medical problems listed below. Physiatrist and rehab team continue to assess barriers to discharge/monitor patient progress toward functional and medical goals  Care Tool:  Bathing    Body parts bathed by patient: Right arm, Left arm, Left upper leg, Right lower leg, Chest, Abdomen, Front perineal area, Buttocks, Right upper leg, Left lower leg, Face         Bathing assist Assist Level: Minimal Assistance - Patient > 75%     Upper Body Dressing/Undressing Upper body dressing   What is the patient wearing?: Bra, Pull over shirt    Upper body assist Assist Level: Minimal Assistance - Patient > 75%    Lower Body Dressing/Undressing Lower body dressing      What is the patient wearing?: Pants, Incontinence brief     Lower body assist Assist for lower body dressing: Minimal Assistance - Patient > 75%     Toileting Toileting    Toileting assist Assist for toileting: Total Assistance - Patient <  25%     Transfers Chair/bed transfer  Transfers assist  Chair/bed transfer activity did not occur: Safety/medical concerns  Chair/bed transfer assist level: Minimal Assistance - Patient > 75%     Locomotion Ambulation   Ambulation assist      Assist level: Minimal Assistance - Patient > 75% Assistive device: Walker-rolling Max distance: 150'   Walk 10 feet activity   Assist     Assist level: Minimal Assistance - Patient > 75% Assistive device: Walker-rolling   Walk 50 feet activity   Assist    Assist level: Minimal Assistance - Patient > 75% Assistive device: Walker-rolling    Walk 150 feet activity   Assist    Assist level: Minimal Assistance - Patient > 75% Assistive device: Walker-rolling    Walk 10 feet on uneven surface  activity   Assist     Assist level: Minimal Assistance - Patient > 75% Assistive device: Development worker, international aid     Assist Is the patient using a wheelchair?: Yes Type of Wheelchair: Manual    Wheelchair assist level: Dependent - Patient 0% Max wheelchair distance: 150    Wheelchair 50 feet with 2 turns activity  Assist        Assist Level: Dependent - Patient 0%   Wheelchair 150 feet activity     Assist      Assist Level: Dependent - Patient 0%   Blood pressure (!) 158/64, pulse 68, temperature 98.8 F (37.1 C), resp. rate 18, height 4\' 11"  (1.499 m), weight 41.7 kg, SpO2 95%. Medical Problem List and Plan: 1. Functional deficits secondary to traumatic SDH with scalp laceration stapled in the ED as well as history Sjorgens syndrome/SLE             -patient may shower             -ELOS/Goals: 10-14 days S--3/20 DC with HH            -Stable to continue CIR  2.  Antithrombotics: -DVT/anticoagulation:  Mechanical: Antiembolism stockings, thigh (TED hose) Bilateral lower extremities             -antiplatelet therapy: N/A  3. Pain Management: Tylenol as needed  -No complaints of pain,  headaches. 3-14: Complaining of severe right-sided headaches, 10 out of 10, not responsive to Tylenol.  Has documented history of allergy to Topamax and with elevated LFTs, will not schedule max dose Tylenol.  Switched to Fioricet 1-2 tabs every 6 hours as needed. -07/12/23 HA this morning, but not given fioricet yet. Asked nurse to give this. No other reports afterwards from nursing; monitor 3-18: Headaches poorly controlled with Fioricet.  Have documented allergy to Topamax, however patient and family are not aware what this is, no description comments.  Will start Topamax 25 mg twice daily, with p.o. and IV Benadryl as needed in case of allergy reaction.  4. Mood/Behavior/Sleep: Trazodone 75 mg nightly.  Provide emotional support             -antipsychotic agents: Seroquel nightly as needed  -3-13: Add as needed melatonin. Add sleep log 3/14: Sleeping well overnight--did have documented use of Seroquel, no events documented overnight, spotted well.  Continue current medications. 3-18: Intermittently agitated overnight, per family get Xanax with dinner at baseline.  Will add back Xanax 0.25 mg as as needed for anxiety.  Continue sleep log.  5. Neuropsych/cognition: This patient is not capable of making decisions on her own behalf. -3-14: Subarachnoid hemorrhage 3-8, should be finishing 7 days of Keppra today.  Hopefully will improve once Keppra is off.   3-18: Per family, it is approximately at cognitive baseline; ongoing left hemineglect is worse - Continue TeleSitter for now  6. Skin/Wound Care: Routine skin checks, should be ready for staple removal on 3/17   - 3/18: Order for staple removal placed  7. Fluids/Electrolytes/Nutrition: Routine in and outs with follow-up chemistries, continue vitamins and supplements  -07/12/23 BMP yesterday basically WNL except Ca 10.4; monitor routine labs 3-17: Mild hyponatremia as below.  P.o. intakes are stable, albumin stable at 2.8.  8.  Seizure  prophylaxis.  Completed 7-day course of Keppra.   9.  History of CVA.  Plavix currently on hold due to SDH.   10.  Hyperlipidemia.  Continue Zocor 20mg  daily   11.  Hypertension.  Restart Norvasc 2.5mg  daily.    - monitor with therapies--tolerating well  -07/12/23 BPs good, monitor  3-17: Some intermittent hypertension overnight, discussed increase of Norvasc to 5 mg daily--family concerned that this this was a contributor to her fall, so we will switch from this to losartan 25 mg daily Vitals:   07/10/23 1358 07/10/23 1936 07/11/23 0526 07/11/23 1423  BP: 127/61 (!) 147/66 126/66 113/70   07/11/23 1945 07/12/23 0527 07/12/23 1321 07/12/23 1806  BP: (!) 159/69 135/62 137/72 (!) 151/73   07/13/23 0358 07/13/23 1500 07/13/23 1917 07/14/23 0409  BP: (!) 157/72 (!) 152/79 (!) 152/65 (!) 158/64     12.  Hyponatremia.  continue sodium chloride tablets.  Follow-up chemistries - resolved on admission labs; reduce chloride tabs form TID to 1 g BID; recheck Saturday for further titration -07/12/23 labs yesterday with appropriate NaCl levels, continue dose for now . 3/ 17: Mild hyponatremia 133 this a.m.; placed 1200 cc fluid restriction and continue salt tabs.  Repeat in 2 to 3 days    Latest Ref Rng & Units 07/13/2023    5:37 AM 07/11/2023    5:23 AM 07/09/2023    6:08 AM  BMP  Glucose 70 - 99 mg/dL 93  94  96   BUN 8 - 23 mg/dL 15  9  9    Creatinine 0.44 - 1.00 mg/dL 1.61  0.96  0.45   Sodium 135 - 145 mmol/L 133  138  135   Potassium 3.5 - 5.1 mmol/L 4.1  4.0  3.8   Chloride 98 - 111 mmol/L 101  100  102   CO2 22 - 32 mmol/L 29  26  27    Calcium 8.9 - 10.3 mg/dL 40.9  81.1  91.4     13.  History of membranous glomerulonephritis.  Patient not on immunosuppression followed by Dr. Clelia Schaumann   - BUN and CR stable  14. Transaminitis.   - reduce PRN tylenol dose to 325 mg   - recheck Monday-- ordered  15. GERD: continue protonix 40mg  daily  16. Constipation: -07/12/23 no BM in 3-4  days, will increase colace to BID, monitor, if no BM by tomorrow, may need sorbitol or some other stimulant laxative.  3-17: No recorded bowel movement, add Senokot S1 tablet twice daily and give as needed MiraLAX today 3/17 LBM, medium  17. UTI/urinary urgency/incontinence   - 3-18 urinalysis significant for infection; culture not ordered with urinalysis so added on today   - Start Keflex 500 mg twice daily for 5 days; follow cultures sensitivity  LOS: 6 days A FACE TO FACE EVALUATION WAS PERFORMED  Angelina Sheriff 07/14/2023, 10:11 AM

## 2023-07-14 NOTE — Progress Notes (Deleted)
 Speech Language Pathology Daily Session Note  Patient Details  Name: Peggy Ramirez MRN: [id number] Date of Birth: 04/27/1934  Today's Date: 07/14/2023 SLP Individual Time: 1100-1200 SLP Individual Time Calculation (min): 60 min  Short Term Goals: Week 1: SLP Short Term Goal 1 (Week 1): Pt will utilize external memory aids as needed to recall orientation information given maxA SLP Short Term Goal 2 (Week 1): Pt will utilize external memory aids to recall recent events with 25% accuracy and maxA SLP Short Term Goal 3 (Week 1): Pt will complete functional problem solving tasks with maxA SLP Short Term Goal 4 (Week 1): Pt will sustain attention during functional cognitive tasks for 5 mins given maxA  Skilled Therapeutic Interventions:   Pt greeted in her room. She was awake/alert in her wheelchair talking to her son upon SLP arrival. SLP facilitated functional tx tasks targeting cognition. She was able to recall the month and year independently and benefited from maxA cues to utilize the calendar and recall remaining information. She also benefited from maxA cues for problem solving, attention, and organization during written organization task fo 4 categories. SLP provided education to pt's son re upcoming d/c and potential UTI. She benefited from frequent redirections throughout tx tasks and education given perseveration on going to the bathroom and changing. Of note, pt presented w/ increased spasticity in her L hand. Anticipate pt anxiety and perseveration negatively impact success as well, as fist clenching reduced when pt was provided w/ a task requiring both hands. At the end of tx tasks, she was left in her chair with the alarm set. Call light within reach. Recommend cont ST per POC.  Pain  No pain reported  Therapy/Group: Individual Therapy  [name] 07/14/2023, 12:41 PM

## 2023-07-14 NOTE — Progress Notes (Signed)
 Speech Language Pathology TBI Note  Patient Details  Name: Peggy Ramirez MRN: [id number] Date of Birth: Nov 22, 1933  Today's Date: 07/14/2023 SLP Individual Time: 1100-1200 SLP Individual Time Calculation (min): 60 min  Short Term Goals: Week 1: SLP Short Term Goal 1 (Week 1): Pt will utilize external memory aids as needed to recall orientation information given maxA SLP Short Term Goal 2 (Week 1): Pt will utilize external memory aids to recall recent events with 25% accuracy and maxA SLP Short Term Goal 3 (Week 1): Pt will complete functional problem solving tasks with maxA SLP Short Term Goal 4 (Week 1): Pt will sustain attention during functional cognitive tasks for 5 mins given maxA  Skilled Therapeutic Interventions:   Pt greeted in her room. She was awake/alert in her wheelchair talking to her son upon SLP arrival. SLP facilitated functional tx tasks targeting cognition. She was able to recall the month and year independently and benefited from maxA cues to utilize the calendar and recall remaining information. She also benefited from maxA cues for problem solving, attention, and organization during written organization task fo 4 categories. SLP provided education to pt's son re upcoming d/c and potential UTI. She benefited from frequent redirections throughout tx tasks and education given perseveration on going to the bathroom and changing. Of note, pt presented w/ increased spasticity in her L hand. Anticipate pt anxiety and perseveration negatively impact success as well, as fist clenching reduced when pt was provided w/ a task requiring both hands. At the end of tx tasks, she was left in her chair with the alarm set. Call light within reach. Recommend cont ST per POC.   Pain  No pain reported  Agitated Behavior Scale: TBI Observation Details Observation Environment: CIR Start of observation period - Date: 07/14/23 Start of observation period - Time: 1100 End of observation  period - Date: 07/14/23 End of observation period - Time: 1200 Agitated Behavior Scale (DO NOT LEAVE BLANKS) Short attention span, easy distractibility, inability to concentrate: Present to a moderate degree Impulsive, impatient, low tolerance for pain or frustration: Absent Uncooperative, resistant to care, demanding: Absent Violent and/or threatening violence toward people or property: Absent Explosive and/or unpredictable anger: Absent Rocking, rubbing, moaning, or other self-stimulating behavior: Present to a slight degree Pulling at tubes, restraints, etc.: Absent Wandering from treatment areas: Absent Restlessness, pacing, excessive movement: Absent Repetitive behaviors, motor, and/or verbal: Present to a slight degree Rapid, loud, or excessive talking: Absent Sudden changes of mood: Absent Easily initiated or excessive crying and/or laughter: Absent Self-abusiveness, physical and/or verbal: Absent Agitated behavior scale total score: 18  Therapy/Group: Individual Therapy  [name] 07/14/2023, 1:18 PM

## 2023-07-14 NOTE — Progress Notes (Signed)
 Patient ID: Peggy Ramirez, female   DOB: 03-02-1934, 89 y.o.   MRN: [id number]   07/13/23-   07/14/23-   Cecile Sheerer, MSW, LCSW Office: [phone] Cell: [phone] Fax: [phone]

## 2023-07-14 NOTE — Progress Notes (Signed)
 Pt restless most of the night. Pt with increased urinating Q 15-30 min for 10 hours. Pt denies pain or burning. Pt is not easily redirected by RN or tele sitter. RN had to sit one to one with Pt for 2 hours to constantly redirect and toilet.

## 2023-07-14 NOTE — Progress Notes (Signed)
 Removed staples from patients posterior head. No drainage. Patient tolerated well.  Peggy Ramirez

## 2023-07-14 NOTE — Progress Notes (Signed)
 Physical Therapy Session Note  Patient Details  Name: Peggy Ramirez MRN: [id number] Date of Birth: 05/18/1933  Today's Date: 07/14/2023 PT Individual Time: 0802-0914 PT Individual Time Calculation (min): 72 min   Short Term Goals: Week 1:  PT Short Term Goal 1 (Week 1): Pt will complete bed mobility with CGA. PT Short Term Goal 2 (Week 1): Pt will complete sit to stand transfer with CGA. PT Short Term Goal 3 (Week 1): Pt will complete bed to chair transfer with minA. PT Short Term Goal 4 (Week 1): Pt will ambulate x150' with CGA and LRAD.  Skilled Therapeutic Interventions/Progress Updates:     Pt received semi reclined in bed in bed and agrees to therapy. No complaint of pain but does perseverate on needing to urinate throughout session, despite RN reporting that pt had just voided bladder. Per RN, who spoke with family, pt's perseveration on needing to urinate is a baseline behavior. Pt performs supine to sit with cues for sequencing and positioning at EOB. Pt dons pants at EOB with minA. Pt then stands with CGA and cues for sequencing and hand placement, and ambulates x100' to dayroom with RW and cues for posture and increasing gait speed to improve balance. Pt performs Nustep for reciprocal coordination training. Pt completes at workload of 5 with average steps per minute ~45. PT provides cueing to take breaks as needed and to attend to Lt hand, due to pt occasionally releasing grip on Lt and not attempting to re-grab the handle.   Pt takes seated rest break, then ambulates x350' with RW and CGA, with cues for upright posture and increasing gait speed to improve balance.   Pt ambulates x150' to room with RW and CGA, requiring minA only for navigation around foor frame in crowded environment. Pt transfer to toilet with minA and cues for use of grab bars for safety, as well as positioning. Pt is continent of bowel and bladder and requires assistance for pericare. Following, pt ambulates to  sink to wash hands with cues for posture, then back to bed. Pt left supine in bed with alarm intact and all needs within reach.   Therapy Documentation Precautions:  Precautions Precautions: None Restrictions Weight Bearing Restrictions Per Provider Order: No    Therapy/Group: Individual Therapy  Beau Fanny, PT, DPT 07/14/2023, 3:56 PM

## 2023-07-14 NOTE — Progress Notes (Signed)
 Occupational Therapy TBI Note  Patient Details  Name: Peggy Ramirez MRN: [id number] Date of Birth: 1933/05/07  Today's Date: 07/14/2023 OT Individual Time: 4696-2952 OT Individual Time Calculation (min): 60 min    Short Term Goals: Week 1:  OT Short Term Goal 1 (Week 1): Pt will perform toileting with contact guard -performing 3/3 steps OT Short Term Goal 2 (Week 1): Pt will shower sit to stand with contact guard with min cues OT Short Term Goal 3 (Week 1): Pt will pick out clothing with min cues OT Short Term Goal 4 (Week 1): Pt will transfer to toilet with LRAD with supervision OT Short Term Goal 5 (Week 1): Pt will be oriented x3 50% of the day with mod cues  Skilled Therapeutic Interventions/Progress Updates:    Pt received sitting in her w/c with no c/o pain. Her son Peggy Ramirez was present and assisting her with oral care. Discussed d/c planning and OT POC. Pt stood from the w/c with close (S). She used the RW to complete functional mobility into the bathroom with close (S). She transferred to the toilet and completed toileting tasks with close (S). She required mod cueing to sequence doffing LB clothing. She had a very difficult time sequencing transfer into the walk in shower with her RW and required min A for RW management. She completed UB bathing seated with (S) and LB in standing with CGA. She returned to EOB following and was provided assist to dry off thoroughly. She completed UB dressing with mod cueing to thread LUE but was able to do so with CGA. She required extra time to don pants with very poor sequencing, requiring mod cueing overall and CGA sit <> stand. She had poor sustained attention to task and was internally distracted. She put her dentures back in with, again poor sequencing to orient them correctly, requiring min A from son. She ended with 125 ft of functional mobility with the RW for first 75 ft and (S), other than occasional min A to manage RW d/t running into things on  the L. HHA provided for final 50 ft with CGA. Signed off her son for transfers and two family members, Peggy Ramirez and Peggy Ramirez. Pt left siting up with RN in room removing staples.    Therapy Documentation Precautions:  Precautions Precautions: None Restrictions Weight Bearing Restrictions Per Provider Order: No  Agitated Behavior Scale: TBI Observation Details Observation Environment: CIR Start of observation period - Date: 07/14/23 Start of observation period - Time: 1300 End of observation period - Date: 07/14/23 End of observation period - Time: 1400 Agitated Behavior Scale (DO NOT LEAVE BLANKS) Short attention span, easy distractibility, inability to concentrate: Present to a moderate degree Impulsive, impatient, low tolerance for pain or frustration: Absent Uncooperative, resistant to care, demanding: Absent Violent and/or threatening violence toward people or property: Absent Explosive and/or unpredictable anger: Absent Rocking, rubbing, moaning, or other self-stimulating behavior: Absent Pulling at tubes, restraints, etc.: Absent Wandering from treatment areas: Absent Restlessness, pacing, excessive movement: Absent Repetitive behaviors, motor, and/or verbal: Present to a slight degree Rapid, loud, or excessive talking: Absent Sudden changes of mood: Absent Easily initiated or excessive crying and/or laughter: Absent Self-abusiveness, physical and/or verbal: Absent Agitated behavior scale total score: 17  Therapy/Group: Individual Therapy  [name] 07/14/2023, 3:16 PM

## 2023-07-15 ENCOUNTER — Other Ambulatory Visit (HOSPITAL_COMMUNITY): Payer: Self-pay

## 2023-07-15 DIAGNOSIS — S065X9S Traumatic subdural hemorrhage with loss of consciousness of unspecified duration, sequela: Secondary | ICD-10-CM | POA: Diagnosis not present

## 2023-07-15 LAB — COMPREHENSIVE METABOLIC PANEL
ALT: 52 U/L — ABNORMAL HIGH (ref 0–44)
AST: 35 U/L (ref 15–41)
Albumin: 2.7 g/dL — ABNORMAL LOW (ref 3.5–5.0)
Alkaline Phosphatase: 69 U/L (ref 38–126)
Anion gap: 8 (ref 5–15)
BUN: 15 mg/dL (ref 8–23)
CO2: 28 mmol/L (ref 22–32)
Calcium: 10.2 mg/dL (ref 8.9–10.3)
Chloride: 94 mmol/L — ABNORMAL LOW (ref 98–111)
Creatinine, Ser: 0.67 mg/dL (ref 0.44–1.00)
GFR, Estimated: >60 mL/min (ref >60)
Glucose, Bld: 96 mg/dL (ref 70–99)
Potassium: 3.6 mmol/L (ref 3.5–5.1)
Sodium: 130 mmol/L — ABNORMAL LOW (ref 135–145)
Total Bilirubin: 0.5 mg/dL (ref 0.0–1.2)
Total Protein: 5.9 g/dL — ABNORMAL LOW (ref 6.5–8.1)

## 2023-07-15 LAB — CBC WITH DIFFERENTIAL/PLATELET
Abs Immature Granulocytes: 0.02 10*3/uL (ref 0.00–0.07)
Basophils Absolute: 0 10*3/uL (ref 0.0–0.1)
Basophils Relative: 1 %
Eosinophils Absolute: 0 10*3/uL (ref 0.0–0.5)
Eosinophils Relative: 1 %
HCT: 34.1 % — ABNORMAL LOW (ref 36.0–46.0)
Hemoglobin: 11.9 g/dL — ABNORMAL LOW (ref 12.0–15.0)
Immature Granulocytes: 0 %
Lymphocytes Relative: 11 %
Lymphs Abs: 0.7 10*3/uL (ref 0.7–4.0)
MCH: 31.8 pg (ref 26.0–34.0)
MCHC: 34.9 g/dL (ref 30.0–36.0)
MCV: 91.2 fL (ref 80.0–100.0)
Monocytes Absolute: 0.7 10*3/uL (ref 0.1–1.0)
Monocytes Relative: 12 %
Neutro Abs: 4.6 10*3/uL (ref 1.7–7.7)
Neutrophils Relative %: 75 %
Platelets: 352 10*3/uL (ref 150–400)
RBC: 3.74 MIL/uL — ABNORMAL LOW (ref 3.87–5.11)
RDW: 12 % (ref 11.5–15.5)
WBC: 6.1 10*3/uL (ref 4.0–10.5)
nRBC: 0 % (ref 0.0–0.2)

## 2023-07-15 MED ORDER — DOCUSATE SODIUM 100 MG PO CAPS: 100.0000 mg | ORAL_CAPSULE | Freq: Two times a day (BID) | ORAL | Status: DC

## 2023-07-15 MED ORDER — LOSARTAN POTASSIUM 25 MG PO TABS
25.0000 mg | ORAL_TABLET | Freq: Every day | ORAL | 0 refills | Status: DC
  Filled 2023-07-15: qty 30, 30d supply, fill #0

## 2023-07-15 MED ORDER — ALPRAZOLAM 0.25 MG PO TABS
0.2500 mg | ORAL_TABLET | Freq: Every evening | ORAL | 0 refills | Status: DC | PRN
  Filled 2023-07-15: qty 30, 30d supply, fill #0

## 2023-07-15 MED ORDER — TRAZODONE HCL 50 MG PO TABS
75.0000 mg | ORAL_TABLET | Freq: Every day | ORAL | 0 refills | Status: DC
  Filled 2023-07-15: qty 30, 20d supply, fill #0

## 2023-07-15 MED ORDER — ACETAMINOPHEN 500 MG PO TABS
1000.0000 mg | ORAL_TABLET | Freq: Four times a day (QID) | ORAL | Status: DC | PRN
  Administered 2023-07-16: 1000 mg via ORAL

## 2023-07-15 MED ORDER — TOPIRAMATE 25 MG PO TABS
25.0000 mg | ORAL_TABLET | Freq: Two times a day (BID) | ORAL | 0 refills | Status: DC
  Filled 2023-07-15: qty 60, 30d supply, fill #0

## 2023-07-15 MED ORDER — SODIUM CHLORIDE 1 G PO TABS
1.0000 g | ORAL_TABLET | Freq: Three times a day (TID) | ORAL | Status: DC
  Administered 2023-07-15 – 2023-07-16 (×3): 1 g via ORAL
  Filled 2023-07-15: qty 1

## 2023-07-15 MED ORDER — QUETIAPINE FUMARATE 25 MG PO TABS
12.5000 mg | ORAL_TABLET | Freq: Every evening | ORAL | 0 refills | Status: DC | PRN
  Filled 2023-07-15: qty 20, 40d supply, fill #0

## 2023-07-15 MED ORDER — BUTALBITAL-APAP-CAFFEINE 50-325-40 MG PO TABS
1.0000 | ORAL_TABLET | Freq: Four times a day (QID) | ORAL | 0 refills | Status: DC | PRN
  Filled 2023-07-15: qty 14, 3d supply, fill #0

## 2023-07-15 MED ORDER — PANTOPRAZOLE SODIUM 40 MG PO TBEC
40.0000 mg | DELAYED_RELEASE_TABLET | Freq: Every day | ORAL | 0 refills | Status: DC
  Filled 2023-07-15: qty 30, 30d supply, fill #0

## 2023-07-15 MED ORDER — VITAMIN C 1000 MG PO TABS
1000.0000 mg | ORAL_TABLET | Freq: Every day | ORAL | 0 refills | Status: DC
  Filled 2023-07-15: qty 30, 30d supply, fill #0

## 2023-07-15 MED ORDER — SIMVASTATIN 20 MG PO TABS
20.0000 mg | ORAL_TABLET | Freq: Every day | ORAL | 0 refills | Status: AC
  Filled 2023-07-15: qty 30, 30d supply, fill #0

## 2023-07-15 NOTE — Progress Notes (Signed)
 Patient ID: Peggy Ramirez, female   DOB: Sep 28, 1933, 89 y.o.   MRN: [id number]  SW informed by medical team, pt will now discharge to home on Saturday per family request with hopes medication for UTI will clear up confusion.Discharge date now Saturday.   *SW met with pt and pt son todd in room. Pt talking to his sister and discussed discharge. Family inquired about placement process. SW explained in length ALF vs SNF, and short term rehab SNF and requiring insurance approval. Ultimately, pt son would like to take his mother home and understands he will be main caregiver with limited support from siblings. Preferred [facility]- [facility].   SW sent HHPT/OT/SLP/aide referral to [facility] and waiting on follow-up.  Cecile Sheerer, MSW, LCSW Office: [phone] Cell: [phone] Fax: [phone]

## 2023-07-15 NOTE — Progress Notes (Signed)
 Speech Language Pathology TBI Note  Patient Details  Name: Peggy Ramirez MRN: [id number] Date of Birth: Feb 07, 1934  Today's Date: 07/15/2023 SLP Individual Time: 4098-1191 SLP Individual Time Calculation (min): 45 min  Short Term Goals: Week 1: SLP Short Term Goal 1 (Week 1): Pt will utilize external memory aids as needed to recall orientation information given maxA SLP Short Term Goal 2 (Week 1): Pt will utilize external memory aids to recall recent events with 25% accuracy and maxA SLP Short Term Goal 3 (Week 1): Pt will complete functional problem solving tasks with maxA SLP Short Term Goal 4 (Week 1): Pt will sustain attention during functional cognitive tasks for 5 mins given maxA  Skilled Therapeutic Interventions:   Structured and functional tasks completed targeting cognition. Initially, the pt and her family were greeted in the bathroom. Pt's family reported a successful BM. SLP assisted family with completion of toileting and peri care. She benefited from maxA for problem solving, sequencing, and safety awareness throughout. Attempted to ambulate back to her chair via rolling walker, though improved coordination and safety noted w/o the walker. Pt noted to hold the walker with only her R hand which resulted in consistent entanglement with her feet during ambulation. Pt was also significantly limited by anxiety today. Anticipate family presence and over cueing heightened her baseline anxiety.  Once seated, SLP facilitated orientation review via calendar. She required modA visual/verbal cues to ID information given L inattention. With maxA cues, she was able to sustain attention for ~5 mins during very functional generative naming task (5 items). MaxA also required for functional problem solving and organization. SLP provided extensive education to the family re current cognitive deficits, safety in the home environment, memory aids, and overstimulation. They verbalized understanding of  education and additional questions were answered. She was left in her chair with her alarm set and call light within reach. Family remained present upon SLP departure. Recommend cont ST per POC.   Pain  No pain reported  Agitated Behavior Scale: TBI Observation Details Observation Environment: cir Start of observation period - Date: 07/15/23 Start of observation period - Time: 0945 End of observation period - Date: 07/15/23 End of observation period - Time: 1030 Agitated Behavior Scale (DO NOT LEAVE BLANKS) Short attention span, easy distractibility, inability to concentrate: Present to a moderate degree Impulsive, impatient, low tolerance for pain or frustration: Absent Uncooperative, resistant to care, demanding: Absent Violent and/or threatening violence toward people or property: Absent Explosive and/or unpredictable anger: Absent Rocking, rubbing, moaning, or other self-stimulating behavior: Absent Pulling at tubes, restraints, etc.: Absent Wandering from treatment areas: Absent Restlessness, pacing, excessive movement: Absent Repetitive behaviors, motor, and/or verbal: Present to a slight degree Rapid, loud, or excessive talking: Absent Sudden changes of mood: Absent Easily initiated or excessive crying and/or laughter: Absent Self-abusiveness, physical and/or verbal: Absent Agitated behavior scale total score: 17  Therapy/Group: Individual Therapy  [name] 07/15/2023, 11:34 AM

## 2023-07-15 NOTE — Progress Notes (Signed)
 Occupational Therapy Session Note  Patient Details  Name: Peggy Ramirez MRN: [id number] Date of Birth: 01/10/1934  Today's Date: 07/15/2023 OT Individual Time: 6578-4696 OT Individual Time Calculation (min): 64 min    Short Term Goals: Week 1:  OT Short Term Goal 1 (Week 1): Pt will perform toileting with contact guard -performing 3/3 steps OT Short Term Goal 2 (Week 1): Pt will shower sit to stand with contact guard with min cues OT Short Term Goal 3 (Week 1): Pt will pick out clothing with min cues OT Short Term Goal 4 (Week 1): Pt will transfer to toilet with LRAD with supervision OT Short Term Goal 5 (Week 1): Pt will be oriented x3 50% of the day with mod cues  Skilled Therapeutic Interventions/Progress Updates:    Family education session completed with pt and her son Peggy Ramirez and DIL Peggy Ramirez. Verbal education provided re fall risk reduction, energy conservation strategies, home carryover of transfer training, ADLs, and IADLs. Demonstration and hands on training completed for pt performance of UB/LB bathing and dressing at (S)- CGA level, toileting hygiene and transfers, and shower transfers. She completed a full shower with mod cueing for L attention and UE use. She was able to complete UB and LB bathing with close (S) sit <> stand. She benefited from direct cueing for LUE use/attention and sequencing. She returned to her w/c following. DIL assisted with dressing- encouraged allowing pt to be as independent as possible. Reviewed importance/benefit of a daily routine at home. Provided HEP for [facility] and theraputty. Reviewed that best HEP is engagement in familiar IADL activities and meaningful tasks. Discussed d/c at length and relayed concerns to team. Pt left sitting up with family present, all needs met.    Therapy Documentation Precautions:  Precautions Precautions: None Restrictions Weight Bearing Restrictions Per Provider Order: No    Therapy/Group: Individual Therapy  [name] 07/15/2023, 9:51 AM

## 2023-07-15 NOTE — Progress Notes (Signed)
 Physical Therapy Session Note  Patient Details  Name: Peggy Ramirez MRN: [id number] Date of Birth: Sep 13, 1933  Today's Date: 07/15/2023 PT Individual Time: 1105-1200 PT Individual Time Calculation (Ramirez): 55 Ramirez   Today's Date: 07/15/2023 PT Individual Time: 1330-1414 PT Individual Time Calculation (Ramirez): 44 Ramirez   Short Term Goals: Week 1:  PT Short Term Goal 1 (Week 1): Pt will complete bed mobility with CGA. PT Short Term Goal 2 (Week 1): Pt will complete sit to stand transfer with CGA. PT Short Term Goal 3 (Week 1): Pt will complete bed to chair transfer with minA. PT Short Term Goal 4 (Week 1): Pt will ambulate x150' with CGA and LRAD.  Skilled Therapeutic Interventions/Progress Updates:     1st Session: Pt received seated in [facility] and agrees to therapy. Reports pain in back of head. PT provides education regarding pain and rest breaks as needed to manage pain. [name] and daughter in law present for family education. PT provides update on pt's mobility status and progress with therapy, as well as recommendations for safe mobility following discharge, including 24/7 supervision/CGA with mobility. Pt requesting to use restroom. [name] and daughter in law both assist pt to toilet, providing assistance on both sides. PT provides cues for safe guarding technique, including utilizing gait belt to allow pt freedom of movement in upper body for increased independence and natural trunk rotation and arm swing. Following toileting, pt is transported to gym for time management. Pt given time with rec therapist and therapy dog to provide therapeutic activity in hopes of easing pt's anxiety. Pt attempts to ambulate with therapist, but demonstrates extreme anxiety of falling, limiting ability to ambulate safely. Pt ambulates x25' with modA/maxA, and pt having difficulty following commands, grabbing therapist with both hands and noting to drag LLE during gait cycle. PT assesses pt for focal deficits and pt  is not noted to have increase in Lt sided weakness. Therapist educates family on potential rationale for pt's presentation, including hospital acquired delirium, anxiety, and systemic infection. Pt requesting additional days prior to DC. PT relays information to care team. Pt left seated in [facility] with alarm intact and all needs within reach.   2nd Session: Pt received seated in [facility] and agrees to therapy. No complaint of pain. Pt appears less anxious than AM session. WC transport to gym. Pt performs sit to stand with minA, then immediately begins to demonstrate high anxiety, turning to grab onto PT with both hands and saying she is "Holding on so [I] don't fall". PT provides gentle redirection and Rt hand held assistance. Pt ambulates x450' with CGA/minA, with cues for upright gaze to improve posture and balance, and increasing gait speed and stride length to decrease risk for falls. Seated rest break. Pt ambulates x200' to room with similar assistance and cues, uses toilet and is contient of bowel with PT assisting for pericare, then ambulates additional x300'. Pt left seated in WC with alarm intact and all needs within reach.    Therapy Documentation Precautions:  Precautions Precautions: None Restrictions Weight Bearing Restrictions Per Provider Order: No    Therapy/Group: Individual Therapy  Beau Fanny, PT, DPT 07/15/2023, 4:10 PM

## 2023-07-15 NOTE — Progress Notes (Signed)
 PROGRESS NOTE   Subjective/Complaints:  BP elevated overnight, 171/82, normotensive this a.m. A.m. labs with worsening hyponatremia now at 130, BUN/creatinine stable, LFTs downtrending.  Otherwise stable. Urine culture remains in process.  Multiple type VI bowel movements overnight.  Continent of bowel, mixed continent of bladder.  Pain recorded has been 0 since starting Topamax.  Has not used fioricet since yesterday morning.  ROS: Denies fevers, chills, CP, SOB, diarrhea.   + Headache -ongoing, bilateral, severe + Lower abdominal pain/nausea  Objective:   CT HEAD WO CONTRAST ( ) Result Date: 07/13/2023 CLINICAL DATA:  Subdural hematoma with worsening neuro deficits. EXAM: CT HEAD WITHOUT CONTRAST TECHNIQUE: Contiguous axial images were obtained from the base of the skull through the vertex without intravenous contrast. RADIATION DOSE REDUCTION: This exam was performed according to the departmental dose-optimization program which includes automated exposure control, adjustment of the mA and/or kV according to patient size and/or use of iterative reconstruction technique. COMPARISON:  CT head 07/04/2023. FINDINGS: Brain: Redemonstrated subdural hematoma over the right cerebral convexity which measures up to 1.1 cm in maximum thickness, previously 1.2 cm. Overall there is decreased attenuation of the blood products compatible with evolving acute to subacute hemorrhage. There is a significant decrease in blood products over the right temporal lobe compared to prior. Decreased scattered subarachnoid hemorrhage compared to 07/04/2023. Small focus of residual subarachnoid hemorrhage noted over the anterior left frontal lobe and right frontoparietal lobes. No new or enlarging intracranial hemorrhage. Similar mild mass effect and sulcal effacement in the right cerebral hemisphere. No significant midline shift. The basilar cisterns are patent.  Ventricles: Prominence of the ventricles suggesting underlying parenchymal volume loss. Vascular: Atherosclerotic calcifications of the carotid siphons and intracranial vertebral arteries. No hyperdense vessel. Skull: No acute or aggressive finding. Orbits: Orbits are symmetric. Sinuses: Focal mucosal thickening in the posterior right maxillary sinus. Other: Trace fluid in the mastoid air cells. Skin staples in the right parietal scalp. IMPRESSION: Similar maximum size of right cerebral convexity subdural hematoma compared to 07/04/2023. Slightly decreased component over the right temporal lobe. Similar mass effect. No significant midline shift. Scattered subarachnoid hemorrhage is significantly decreased. Electronically Signed   By: Emily Filbert M.D.   On: 07/13/2023 13:39   Recent Labs    07/13/23 0537 07/15/23 0509  WBC 5.8 6.1  HGB 11.7* 11.9*  HCT 33.7* 34.1*  PLT 318 352    Recent Labs    07/13/23 0537 07/15/23 0509  NA 133* 130*  K 4.1 3.6  CL 101 94*  CO2 29 28  GLUCOSE 93 96  BUN 15 15  CREATININE 0.74 0.67  CALCIUM 10.0 10.2    Intake/Output Summary (Last 24 hours) at 07/15/2023 5638 Last data filed at 07/15/2023 0800 Gross per 24 hour  Intake 1097 ml  Output --  Net 1097 ml        Physical Exam: Vital Signs Blood pressure (!) 123/95, pulse 83, temperature 99.1 F (37.3 C), temperature source Oral, resp. rate 14, height 4\' 11"  (1.499 m), weight 42.3 kg, SpO2 97%.  General: No acute distress, sitting up at bedside. Heart: Regular rate and rhythm ; mild systolic murmur Lungs: Clear to auscultation, breathing unlabored,  no rales or wheezes Abdomen: Positive bowel sounds, soft nontender to palpation, nondistended Extremities: No clubbing, cyanosis, or edema Skin: C/D/I. No apparent lesions.   Small scalp laceration-healing.    MSK:      No apparent deformity.  Full passive range of motion all 4 extremities.       Neurologic exam:  Cognition: AAO to person,  place, and partially time and event.   + Moderate memory deficits + Mild left hemineglect, can activate with encouragement but forgetful limb is up in space. Insight:  poor insight into current condition.  Mood: Moderately labile, pleasant  Motor: Antigravity against resistance in all 4 extremities, mild left upper and lower extremity weakness 5-/5.  + Left upper extremity ataxia Sensory intact.    Assessment/Plan: 1. Functional deficits which require 3+ hours per day of interdisciplinary therapy in a comprehensive inpatient rehab setting. Physiatrist is providing close team supervision and 24 hour management of active medical problems listed below. Physiatrist and rehab team continue to assess barriers to discharge/monitor patient progress toward functional and medical goals  Care Tool:  Bathing    Body parts bathed by patient: Right arm, Left arm, Left upper leg, Right lower leg, Chest, Abdomen, Front perineal area, Buttocks, Right upper leg, Left lower leg, Face         Bathing assist Assist Level: Minimal Assistance - Patient > 75%     Upper Body Dressing/Undressing Upper body dressing   What is the patient wearing?: Bra, Pull over shirt    Upper body assist Assist Level: Minimal Assistance - Patient > 75%    Lower Body Dressing/Undressing Lower body dressing      What is the patient wearing?: Pants, Incontinence brief     Lower body assist Assist for lower body dressing: Minimal Assistance - Patient > 75%     Toileting Toileting    Toileting assist Assist for toileting: Total Assistance - Patient < 25%     Transfers Chair/bed transfer  Transfers assist  Chair/bed transfer activity did not occur: Safety/medical concerns  Chair/bed transfer assist level: Minimal Assistance - Patient > 75%     Locomotion Ambulation   Ambulation assist      Assist level: Minimal Assistance - Patient > 75% Assistive device: Walker-rolling Max distance: 150'    Walk 10 feet activity   Assist     Assist level: Minimal Assistance - Patient > 75% Assistive device: Walker-rolling   Walk 50 feet activity   Assist    Assist level: Minimal Assistance - Patient > 75% Assistive device: Walker-rolling    Walk 150 feet activity   Assist    Assist level: Minimal Assistance - Patient > 75% Assistive device: Walker-rolling    Walk 10 feet on uneven surface  activity   Assist     Assist level: Minimal Assistance - Patient > 75% Assistive device: Development worker, international aid     Assist Is the patient using a wheelchair?: Yes Type of Wheelchair: Manual    Wheelchair assist level: Dependent - Patient 0% Max wheelchair distance: 150    Wheelchair 50 feet with 2 turns activity    Assist        Assist Level: Dependent - Patient 0%   Wheelchair 150 feet activity     Assist      Assist Level: Dependent - Patient 0%   Blood pressure (!) 123/95, pulse 83, temperature 99.1 F (37.3 C), temperature source Oral, resp. rate 14, height 4\' 11"  (1.499 m), weight 42.3  kg, SpO2 97%. Medical Problem List and Plan: 1. Functional deficits secondary to traumatic SDH with scalp laceration stapled in the ED as well as history Sjorgens syndrome/SLE             -patient may shower             -ELOS/Goals: 10-14 days S--3/20 DC with HH            -Stable to continue CIR  2.  Antithrombotics: -DVT/anticoagulation:  Mechanical: Antiembolism stockings, thigh (TED hose) Bilateral lower extremities             -antiplatelet therapy: N/A  3. Pain Management: Tylenol as needed  -No complaints of pain, headaches. 3-14: Complaining of severe right-sided headaches, 10 out of 10, not responsive to Tylenol.  Has documented history of allergy to Topamax and with elevated LFTs, will not schedule max dose Tylenol.  Switched to Fioricet 1-2 tabs every 6 hours as needed. -07/12/23 HA this morning, but not given fioricet yet. Asked nurse to give  this. No other reports afterwards from nursing; monitor 3-18: Headaches poorly controlled with Fioricet.  Have documented allergy to Topamax, however patient and family are not aware what this is, no description comments.  Will start Topamax 25 mg twice daily, with p.o. and IV Benadryl as needed in case of allergy reaction.  4. Mood/Behavior/Sleep: Trazodone 75 mg nightly.  Provide emotional support             -antipsychotic agents: Seroquel nightly as needed  -3-13: Add as needed melatonin. Add sleep log 3/14: Sleeping well overnight--did have documented use of Seroquel, no events documented overnight, spotted well.  Continue current medications. 3-18: Intermittently agitated overnight, per family get Xanax with dinner at baseline.  Will add back Xanax 0.25 mg as as needed for anxiety.  Continue sleep log. 3-19: Use Xanax and melatonin overnight.***  5. Neuropsych/cognition: This patient is not capable of making decisions on her own behalf. -3-14: Subarachnoid hemorrhage 3-8, should be finishing 7 days of Keppra today.  Hopefully will improve once Keppra is off.   3-18: Per family, it is approximately at cognitive baseline; ongoing left hemineglect is worse - Continue TeleSitter for now  6. Skin/Wound Care: Routine skin checks, should be ready for staple removal on 3/17   - 3/18: Order for staple removal placed  7. Fluids/Electrolytes/Nutrition: Routine in and outs with follow-up chemistries, continue vitamins and supplements  -07/12/23 BMP yesterday basically WNL except Ca 10.4; monitor routine labs 3-17: Mild hyponatremia as below.  P.o. intakes are stable, albumin stable at 2.8.  8.  Seizure prophylaxis.  Completed 7-day course of Keppra.   9.  History of CVA.  Plavix currently on hold due to SDH.   10.  Hyperlipidemia.  Continue Zocor 20mg  daily   11.  Hypertension.  Restart Norvasc 2.5mg  daily.    - monitor with therapies--tolerating well  -07/12/23 BPs good, monitor  3-17: Some  intermittent hypertension overnight, discussed increase of Norvasc to 5 mg daily--family concerned that this this was a contributor to her fall, so we will switch from this to losartan 25 mg daily  3-19: More hypertensive overnight, normotensive this a.m.  Just darted losartan today, so will monitor for effect.  May need to switch BP medications overnight. Vitals:   07/11/23 1423 07/11/23 1945 07/12/23 0527 07/12/23 1321  BP: 113/70 (!) 159/69 135/62 137/72   07/12/23 1806 07/13/23 0358 07/13/23 1500 07/13/23 1917  BP: (!) 151/73 (!) 157/72 (!) 152/79 (!) 152/65  07/14/23 0409 07/14/23 1411 07/14/23 2029 07/15/23 0235  BP: (!) 158/64 132/71 (!) 171/82 (!) 123/95     12.  Hyponatremia.  continue sodium chloride tablets.  Follow-up chemistries - resolved on admission labs; reduce chloride tabs form TID to 1 g BID; recheck Saturday for further titration -07/12/23 labs yesterday with appropriate NaCl levels, continue dose for now . 3/ 17: Mild hyponatremia 133 this a.m.; placed 1200 cc fluid restriction and continue salt tabs.  Repeat in 2 to 3 days 3/19: NA now down to 130; increase salt tabs to 3 times daily    Latest Ref Rng & Units 07/15/2023    5:09 AM 07/13/2023    5:37 AM 07/11/2023    5:23 AM  BMP  Glucose 70 - 99 mg/dL 96  93  94   BUN 8 - 23 mg/dL 15  15  9    Creatinine 0.44 - 1.00 mg/dL 2.02  5.42  7.06   Sodium 135 - 145 mmol/L 130  133  138   Potassium 3.5 - 5.1 mmol/L 3.6  4.1  4.0   Chloride 98 - 111 mmol/L 94  101  100   CO2 22 - 32 mmol/L 28  29  26    Calcium 8.9 - 10.3 mg/dL 23.7  62.8  31.5     13.  History of membranous glomerulonephritis.  Patient not on immunosuppression followed by Dr. Clelia Schaumann   - BUN and CR stable  14. Transaminitis.   - reduce PRN tylenol dose to 325 mg   - recheck Monday-- ordered  15. GERD: continue protonix 40mg  daily  16. Constipation: -07/12/23 no BM in 3-4 days, will increase colace to BID, monitor, if no BM by tomorrow, may need  sorbitol or some other stimulant laxative.  3-17: No recorded bowel movement, add Senokot S1 tablet twice daily and give as needed MiraLAX today 3/18 LBM  17. UTI/urinary urgency/incontinence   - 3-18 urinalysis significant for infection; culture not ordered with urinalysis so added on today   - Start Keflex 500 mg twice daily for 5 days; follow cultures sensitivity  3/19: Continence seems to be improving somewhat, now mixed.  Cultures pending  LOS: 7 days A FACE TO FACE EVALUATION WAS PERFORMED  Angelina Sheriff 07/15/2023, 8:21 AM

## 2023-07-16 ENCOUNTER — Inpatient Hospital Stay (HOSPITAL_COMMUNITY)

## 2023-07-16 ENCOUNTER — Other Ambulatory Visit (HOSPITAL_COMMUNITY): Payer: Self-pay

## 2023-07-16 DIAGNOSIS — N390 Urinary tract infection, site not specified: Secondary | ICD-10-CM

## 2023-07-16 DIAGNOSIS — E871 Hypo-osmolality and hyponatremia: Secondary | ICD-10-CM

## 2023-07-16 DIAGNOSIS — R404 Transient alteration of awareness: Secondary | ICD-10-CM

## 2023-07-16 LAB — OSMOLALITY: Osmolality: 270 mosm/kg — ABNORMAL LOW (ref 275–295)

## 2023-07-16 LAB — OSMOLALITY, URINE: Osmolality, Ur: 276 mosm/kg — ABNORMAL LOW (ref 300–900)

## 2023-07-16 LAB — BASIC METABOLIC PANEL
Anion gap: 10 (ref 5–15)
Anion gap: 10 (ref 5–15)
BUN: 13 mg/dL (ref 8–23)
BUN: 13 mg/dL (ref 8–23)
CO2: 22 mmol/L (ref 22–32)
CO2: 22 mmol/L (ref 22–32)
Calcium: 10.2 mg/dL (ref 8.9–10.3)
Calcium: 9.5 mg/dL (ref 8.9–10.3)
Chloride: 95 mmol/L — ABNORMAL LOW (ref 98–111)
Chloride: 92 mmol/L — ABNORMAL LOW (ref 98–111)
Creatinine, Ser: 0.66 mg/dL (ref 0.44–1.00)
Creatinine, Ser: 0.66 mg/dL (ref 0.44–1.00)
GFR, Estimated: >60 mL/min (ref >60)
GFR, Estimated: >60 mL/min (ref >60)
Glucose, Bld: 91 mg/dL (ref 70–99)
Glucose, Bld: 88 mg/dL (ref 70–99)
Potassium: 3.9 mmol/L (ref 3.5–5.1)
Potassium: 3.6 mmol/L (ref 3.5–5.1)
Sodium: 127 mmol/L — ABNORMAL LOW (ref 135–145)
Sodium: 124 mmol/L — ABNORMAL LOW (ref 135–145)

## 2023-07-16 LAB — SODIUM, URINE, RANDOM: Sodium, Ur: 14 mmol/L

## 2023-07-16 LAB — SODIUM: Sodium: 125 mmol/L — ABNORMAL LOW (ref 135–145)

## 2023-07-16 MED ORDER — MELATONIN 5 MG PO TABS
5.0000 mg | ORAL_TABLET | Freq: Every day | ORAL | 0 refills | Status: DC
Start: 2023-07-16 — End: 2023-07-24
  Filled 2023-07-16: qty 30, 30d supply, fill #0

## 2023-07-16 MED ORDER — SODIUM CHLORIDE 1 G PO TABS
2.0000 g | ORAL_TABLET | Freq: Two times a day (BID) | ORAL | Status: DC
  Administered 2023-07-16 – 2023-07-18 (×4): 2 g via ORAL
  Filled 2023-07-16 (×3): qty 2

## 2023-07-16 MED ORDER — SODIUM CHLORIDE 0.9 % IV SOLN
500.0000 mg | Freq: Two times a day (BID) | INTRAVENOUS | Status: DC
  Administered 2023-07-17 (×2): 500 mg via INTRAVENOUS
  Filled 2023-07-16 (×4): qty 10

## 2023-07-16 MED ORDER — POLYETHYLENE GLYCOL 3350 17 G PO PACK
17.0000 g | PACK | Freq: Every day | ORAL | Status: DC | PRN
Start: 2023-07-16 — End: 2023-07-24

## 2023-07-16 MED ORDER — MELATONIN 5 MG PO TABS
5.0000 mg | ORAL_TABLET | Freq: Every day | ORAL | Status: DC
  Administered 2023-07-16 – 2023-07-18 (×3): 5 mg via ORAL
  Filled 2023-07-16 (×3): qty 1

## 2023-07-16 MED ORDER — SODIUM CHLORIDE 1 G PO TABS
2.0000 g | ORAL_TABLET | Freq: Two times a day (BID) | ORAL | 0 refills | Status: DC
  Filled 2023-07-16: qty 60, 15d supply, fill #0

## 2023-07-16 MED ORDER — SODIUM CHLORIDE 0.9 % IV SOLN: INTRAVENOUS | Status: AC

## 2023-07-16 MED ORDER — CEPHALEXIN 500 MG PO CAPS
500.0000 mg | ORAL_CAPSULE | Freq: Two times a day (BID) | ORAL | 0 refills | Status: DC
  Filled 2023-07-16: qty 2, 1d supply, fill #0

## 2023-07-16 NOTE — Progress Notes (Signed)
 Occupational Therapy Session Note  Patient Details  Name: Peggy Ramirez MRN: [id number] Date of Birth: [birth year]  Today's Date: 07/16/2023 OT Individual Time: 0935-1030 OT Individual Time Calculation (min): 55 min    Short Term Goals: Week 1:  OT Short Term Goal 1 (Week 1): Pt will perform toileting with contact guard -performing 3/3 steps OT Short Term Goal 2 (Week 1): Pt will shower sit to stand with contact guard with min cues OT Short Term Goal 3 (Week 1): Pt will pick out clothing with min cues OT Short Term Goal 4 (Week 1): Pt will transfer to toilet with LRAD with supervision OT Short Term Goal 5 (Week 1): Pt will be oriented x3 50% of the day with mod cues Week 2:    Week 3:     Skilled Therapeutic Interventions/Progress Updates:    1:1 Pt received from SLP in the w/c. Pt with increased confusion today with requiring increased time to answer questions and follow commands. Pt engaged in bathing at shower level. Pt with difficulty using RW today due to increased inattention to left hand and maintaining a flexor synergic withdrawal at her chest. Pt able to come out of it with mod cues within a functional task; especially with bilateral UE tasks. Pt transfers with min A HHA; taking short small steps.  Pt showered with mod to max cues for sequencing and HHA to to use left hand to wash right UE. Pt continued to keep hand fisted in front of her. Pt would hold on to the grab bar with her left hand with assist but then would have difficulty to release.  Pt transferred to the w/c to dress with max cues and assistance for attention to left field and to incorporate her left hand to dress. Mod A to don shirt today and mod A don pants. Pt continued to ask therapist to assist her with minimal awareness reporting that she isn't preforming as well today.  Pt ambulated from room around the unit with min A with goal to increased speed and attention to the left. RW again no successful today due to  decr attention and use of left hand.  Pt transitioned back into bed at end of session with CGA with extra time.   Therapy Documentation Precautions:  Precautions Precautions: None Restrictions Weight Bearing Restrictions Per Provider Order: No  Pain: Pt reports head ache but can continue   Therapy/Group: Individual Therapy  Roney Mans [facility] 07/16/2023, 10:27 AM

## 2023-07-16 NOTE — Progress Notes (Addendum)
 Patient ID: Peggy Ramirez, female   DOB: March 17, 1934, 89 y.o.   MRN: [id number]  HHPT/OT/SLP/aide referral declined by [facility]  due to staffing limitations.  SW met with pt son [name] to inform on above. Amenable to SW finding HHA. He also asked if pt can have a w/c. SW informed will speak with medical team to determine if this is appropriate. SW sent referral to [facility] and waiting on follow-up.   *SW updated pt son [name] on DME ordered, and informed if copay, will need to pay prior to mother's discharge.   Cecile Sheerer, MSW, LCSW Office: [phone] Cell: [phone] Fax: [phone]

## 2023-07-16 NOTE — Progress Notes (Signed)
 Physical Therapy Session Note  Patient Details  Name: Peggy Ramirez MRN: [id number] Date of Birth: 27-Oct-1933  Today's Date: 07/16/2023 PT Missed Time: 45 Minutes Missed Time Reason: CT/MRI  Short Term Goals: Week 1:  PT Short Term Goal 1 (Week 1): Pt will complete bed mobility with CGA. PT Short Term Goal 2 (Week 1): Pt will complete sit to stand transfer with CGA. PT Short Term Goal 3 (Week 1): Pt will complete bed to chair transfer with minA. PT Short Term Goal 4 (Week 1): Pt will ambulate x150' with CGA and LRAD.  Skilled Therapeutic Interventions/Progress Updates:     Pt off floor for CT scan. PT will follow up as able.   Therapy Documentation Precautions:  Precautions Precautions: None Restrictions Weight Bearing Restrictions Per Provider Order: No    Therapy/Group: Individual Therapy  Beau Fanny 07/16/2023, 3:43 PM

## 2023-07-16 NOTE — Consult Note (Signed)
 Initial Consultation Note   Patient: Peggy Ramirez [id number] DOB: Oct 19, 1933 PCP: Estanislado Pandy, MD DOA: 07/08/2023 DOS: the patient was seen and examined on 07/16/2023 Primary service: Angelina Sheriff, DO  Referring physician: Dr Shearon Stalls Reason for consult: worsening hyponatremia  Assessment/Plan: Assessment and Plan:  Hyponatremia -the patient appears euvolemic at this time.  She has been receiving IV fluids all day.  She was extremely lethargic at the time of my evaluation and I see she has a UTI so I suspect she has not been eating well and that early dehydration may have been playing a role in the decrease in her sodium.  Her urine sodium is still pending so her Fena can be calculated. - At this time would continue the normal saline.  Have a low threshold for starting hypertonic saline which will definitely improve the course.  I am hesitant at this time given the increase size of her subdural with mass effect.  2. UTI w >100000 Enterococcus and Pseudomonas.  Specificities pending. - Will start cefepime as the patient did have a low-grade fever tonight and this could explain her decreased level of consciousness.  3. Increased size of subdural with increased midline shift  4. Decreased LOC - Likely multifactorial, all of the above.   TRH will continue to follow the patient.  HPI: Peggy Ramirez is a 89 y.o. female with past medical history of traumatic subdural hematoma on seizure prophylaxis medications,, lupus, Sjogren syndrome, membranous glomerulonephritis, history of CVA who is in the acute rehab unit after sustaining the subdural hematoma.  She has been on rehab for 2 weeks.  The patient did have trouble with hyponatremia when she was hospitalized and was on DDAVP told. Na = 133 - 130 - 127- 124- 125  With a sodium level of 127 with a.m. labs the patient was started on normal saline at 75 cc an hour and salt tablets.  Her next sodium in the evening was down to 124.   That is when we were called to make recommendations.  Without any change in fluids the next sodium level has gone up to 125.  Review of Systems: unable to review all systems due to the inability of the patient to answer questions. Past Medical History:  Diagnosis Date   Acute metabolic encephalopathy    Chronic constipation    Chronic left lower quadrant pain    GERD (gastroesophageal reflux disease)    Headache(784.0)    High cholesterol    Hydrosalpinx    LEFT   Hypertension    Lupus    Membranous glomerulonephritis    followed by Dr. Clelia Schaumann   Ovarian fibroma    RIGHT   Pelvic adhesions    Sjogren's syndrome (HCC)    Stroke [facility])    Past Surgical History:  Procedure Laterality Date   APPENDECTOMY  04/28/2000   BIOPSY  12/17/2020   Procedure: BIOPSY;  Surgeon: Lanelle Bal, DO;  Location: [facility] ENDO SUITE;  Service: Endoscopy;;   COLONOSCOPY WITH PROPOFOL N/A 12/17/2020   Procedure: COLONOSCOPY WITH PROPOFOL;  Surgeon: Lanelle Bal, DO;  Location: [facility] ENDO SUITE;  Service: Endoscopy;  Laterality: N/A;   FRACTURE SURGERY     HIP PINNING,CANNULATED Left 12/15/2019   Procedure: CANNULATED HIP PINNING;  Surgeon: Roby Lofts, MD;  Location: [facility] OR;  Service: Orthopedics;  Laterality: Left;   LAPAROSCOPIC CHOLECYSTECTOMY  04/28/2000   TONSILLECTOMY  04/29/1939   TOTAL ABDOMINAL HYSTERECTOMY W/ BILATERAL SALPINGOOPHORECTOMY  04/29/2003  VAGINAL HYSTERECTOMY  04/29/1979   Social History:  reports that she has never smoked. She has never used smokeless tobacco. She reports that she does not drink alcohol and does not use drugs.  Allergies  Allergen Reactions   Atorvastatin Other (See Comments)    Muscle aches   Cellcept [Mycophenolate Mofetil] Other (See Comments)    unknown   Medrol [Methylprednisolone] Other (See Comments)    unknown   Mycophenolate Mofetil Other (See Comments)    unknown   Penicillins Itching   Sulfonamide Derivatives Nausea Only    Topiramate Other (See Comments)    unknown   Verapamil Other (See Comments)    Felt shakey   Levofloxacin Rash    Family History  Problem Relation Age of Onset   Heart disease Other    Anxiety disorder Other    Depression Other     Prior to Admission medications   Medication Sig Start Date End Date Taking? Authorizing Provider  ALPRAZolam (XANAX) 0.25 MG tablet Take 1 tablet (0.25 mg total) by mouth at bedtime as needed for anxiety. 07/15/23   Angiulli, Mcarthur Rossetti, PA-C  amLODipine (NORVASC) 2.5 MG tablet Take 1 tablet (2.5 mg total) by mouth daily. 05/21/23   Antoine Poche, MD  Ascorbic Acid (VITAMIN C) 1000 MG tablet Take 1 tablet (1,000 mg total) by mouth daily. 07/15/23   Angiulli, Mcarthur Rossetti, PA-C  Carboxymethylcellulose Sodium (REFRESH LIQUIGEL OP) Place 1 drop into both eyes in the morning, at noon, in the evening, and at bedtime. Gel drops    [provider]  cephALEXin (KEFLEX) 500 MG capsule Take 1 capsule (500 mg total) by mouth every 12 (twelve) hours. 07/16/23   Angiulli, Mcarthur Rossetti, PA-C  docusate sodium (COLACE) 100 MG capsule Take 100 mg by mouth daily as needed for moderate constipation.    [provider]  docusate sodium (COLACE) 100 MG capsule Take 1 capsule (100 mg total) by mouth 2 (two) times daily. 07/15/23   Angiulli, Mcarthur Rossetti, PA-C  levETIRAcetam (KEPPRA) 500 MG tablet Take 1 tablet (500 mg total) by mouth 2 (two) times daily for 4 days. 07/08/23 07/12/23  Rai, Delene Ruffini, MD  loratadine (CLARITIN) 10 MG tablet Take 10 mg by mouth daily as needed for allergies.    [provider]  losartan (COZAAR) 25 MG tablet Take 1 tablet (25 mg total) by mouth daily. 07/15/23   Angiulli, Mcarthur Rossetti, PA-C  melatonin 5 MG TABS Take 1 tablet (5 mg total) by mouth at bedtime. 07/16/23   Angiulli, Mcarthur Rossetti, PA-C  Misc. Devices (3-IN-1 BEDSIDE TOILET) MISC Patient needs 3 in 1 bedside toilet. Recent hip fracture with repair 12/17/19   Roberto Scales D, MD  Multiple  Vitamin (MULTIVITAMIN) tablet Take 1 tablet by mouth daily.    [provider]  pantoprazole (PROTONIX) 40 MG tablet Take 1 tablet (40 mg total) by mouth daily. 07/15/23   Angiulli, Mcarthur Rossetti, PA-C  polyethylene glycol powder (GLYCOLAX/MIRALAX) 17 GM/SCOOP powder Take 17 g by mouth daily as needed for mild constipation. 12/17/20   Vassie Loll, MD  simvastatin (ZOCOR) 20 MG tablet Take 1 tablet (20 mg total) by mouth daily. 07/15/23   Angiulli, Mcarthur Rossetti, PA-C  sodium chloride 1 g tablet Take 1 tablet (1 g total) by mouth 3 (three) times daily with meals. 07/08/23   Rai, Ripudeep K, MD  sodium chloride 1 g tablet Take 2 tablets (2 g total) by mouth 2 (two) times daily with a meal.  07/16/23   Charlton Amor, PA-C    Physical Exam: Vitals:   07/15/23 2115 07/16/23 0340 07/16/23 0500 07/16/23 2122  BP: (!) 106/54 139/77  (!) 139/59  Pulse: 68 (!) 57  (!) 57  Resp: 18 18  16   Temp: (!) 97.2 F (36.2 C) 97.9 F (36.6 C)  99.5 F (37.5 C)  TempSrc:    Oral  SpO2: 97% 99%  100%  Weight:   42.3 kg   Height:       Physical Exam:  General: lethargic, frail, malnourished HEENT: Normocephalic, atraumatic, PERRL Cardiovascular: Normal rate and rhythm. Distal pulses intact. Pulmonary: Normal pulmonary effort, normal breath sounds Gastrointestinal: Nondistended abdomen, soft, non-tender, normoactive bowel sounds Musculoskeletal:Normal ROM, no lower ext edema Skin: Skin is warm and dry. Neuro: Could not stay awake to follow commands PSYCH: lethargic  Data Reviewed:   Results for orders placed or performed during the hospital encounter of 07/08/23 (from the past 24 hours)  Basic metabolic panel     Status: Abnormal   Collection Time: 07/16/23  6:04 AM  Result Value Ref Range   Sodium 127 (L) 135 - 145 mmol/L   Potassium 3.9 3.5 - 5.1 mmol/L   Chloride 95 (L) 98 - 111 mmol/L   CO2 22 22 - 32 mmol/L   Glucose, Bld 91 70 - 99 mg/dL   BUN 13 8 - 23 mg/dL   Creatinine, Ser 1.61 0.44  - 1.00 mg/dL   Calcium 09.6 8.9 - 04.5 mg/dL   GFR, Estimated >40 >98 mL/min   Anion gap 10 5 - 15  Osmolality     Status: Abnormal   Collection Time: 07/16/23 10:51 AM  Result Value Ref Range   Osmolality 270 (L) 275 - 295 mOsm/kg  Basic metabolic panel     Status: Abnormal   Collection Time: 07/16/23  4:55 PM  Result Value Ref Range   Sodium 124 (L) 135 - 145 mmol/L   Potassium 3.6 3.5 - 5.1 mmol/L   Chloride 92 (L) 98 - 111 mmol/L   CO2 22 22 - 32 mmol/L   Glucose, Bld 88 70 - 99 mg/dL   BUN 13 8 - 23 mg/dL   Creatinine, Ser 1.19 0.44 - 1.00 mg/dL   Calcium 9.5 8.9 - 14.7 mg/dL   GFR, Estimated >82 >95 mL/min   Anion gap 10 5 - 15  Sodium, urine, random     Status: None   Collection Time: 07/16/23  6:58 PM  Result Value Ref Range   Sodium, Ur 14 mmol/L  Osmolality, urine     Status: Abnormal   Collection Time: 07/16/23  6:58 PM  Result Value Ref Range   Osmolality, Ur 276 (L) 300 - 900 mOsm/kg  Sodium     Status: Abnormal   Collection Time: 07/16/23 10:04 PM  Result Value Ref Range   Sodium 125 (L) 135 - 145 mmol/L      Family Communication: none Primary team communication: Will follow up in the am. Thank you very much for involving [place] in the care of your patient.  Author: Buena Irish, MD 07/16/2023 11:07 PM  For on call review [email or web address].

## 2023-07-16 NOTE — Progress Notes (Addendum)
 Speech Language Pathology TBI Note  Patient Details  Name: Peggy Ramirez MRN: [id number] Date of Birth: [birth year]  Today's Date: 07/16/2023 SLP Individual Time: 8413-2440 SLP Individual Time Calculation (min): 45 min  Today's Date: 07/16/2023 SLP Individual Time: 1330-1430 SLP Individual Time Calculation (min): 60 min  Short Term Goals: Week 1: SLP Short Term Goal 1 (Week 1): Pt will utilize external memory aids as needed to recall orientation information given maxA SLP Short Term Goal 2 (Week 1): Pt will utilize external memory aids to recall recent events with 25% accuracy and maxA SLP Short Term Goal 3 (Week 1): Pt will complete functional problem solving tasks with maxA SLP Short Term Goal 4 (Week 1): Pt will sustain attention during functional cognitive tasks for 5 mins given maxA  Skilled Therapeutic Interventions: 0845- 0930: Pt greeted in bed, asleep upon SLP arrival. SLP facilitated a variety of functional tx tasks targeting cognition. She was difficult to wake, requiring mod-max verbal tactile cues. A warm washcloth was helpful to increase arousal. She required maxA verbal cues for sequencing, safety awareness, problem solving, and initiation during upper/lower body dressing. Pt also assisted to the bathroom with hand held assist. She was continent of bowel and bladder, but required max-DEP verbal/tactile cues to follow one step directions throughout toileting. Second staff member called in for safety given difficulty following directions and reduced initiation. After toileting, she was assisted back to her chair w/ hand held assist and SLP facilitated orientation review via calendar. She required additional processing time and repetitions of questions as compared to prev tx sessions. Vitals were WFL, anticipate reduced success today was d/t exacerbation of deficits given UTI. She was left in her chair with the alarm set. Direct handoff completed with OT. Recommend cont ST.    1330-1430: Pt and her son greeted in the room. She was up in her wheelchair upon SLP arrival. She appeared fatigued, though improved attention/alertness as compared to the morning. SLP facilitated a variety of functional tx tasks targeting cognition. Reviewed orientation information via calendar again given STM deficits. She benefited from maxA cues for attention, problem solving, and processing during written task re biographical info. MaxA also required during a card task comparing values. She required multiple rest breaks and frequent cueing to remain awake throughout structured tasks. She was assisted to the bathroom upon request where she was continent of bladder. She benefited from minA physical assistance to ambulate to the toilet. MaxA required for toileting overall d/t problem solving deficits and fear of falling. She was then assisted back to her chair and was eft in the chair with the alarm set and son, [name], present upon SLP departure. Recommend cont ST per POC.   Pain Pain Assessment Pain Scale: 0-10 Pain Score: 0-No pain  Agitated Behavior Scale: TBI Observation Details Observation Environment: CIR Start of observation period - Date: 07/16/23 Start of observation period - Time: 0845 End of observation period - Date: 07/16/23 End of observation period - Time: 0945 Agitated Behavior Scale (DO NOT LEAVE BLANKS) Short attention span, easy distractibility, inability to concentrate: Present to an extreme degree Impulsive, impatient, low tolerance for pain or frustration: Absent Uncooperative, resistant to care, demanding: Absent Violent and/or threatening violence toward people or property: Absent Explosive and/or unpredictable anger: Absent Rocking, rubbing, moaning, or other self-stimulating behavior: Absent Pulling at tubes, restraints, etc.: Absent Wandering from treatment areas: Absent Restlessness, pacing, excessive movement: Absent Repetitive behaviors, motor, and/or  verbal: Absent Rapid, loud, or excessive talking: Absent Sudden changes  of mood: Absent Easily initiated or excessive crying and/or laughter: Absent Self-abusiveness, physical and/or verbal: Absent Agitated behavior scale total score: 17  Therapy/Group: Individual Therapy  [name] 07/16/2023, 9:48 AM

## 2023-07-16 NOTE — Progress Notes (Addendum)
 PROGRESS NOTE   Subjective/Complaints:  BP little soft overnight, more normotensive this a.m. Reports subclinical fever 99.1 yesterday afternoon, patient got Tylenol, did not recur.  She denies any fevers, chills.  Currently have severe headache, right posterior side, 10 out of 10.  Worsening left inattention today and confusion.   ROS: Denies fevers, chills, CP, SOB, diarrhea.   + Headache -severe, right-sided, 10 out of 10 + Lower abdominal pain/nausea--no longer present + Confusion, ongoing + anxiety  Objective:   No results found.  Recent Labs    07/15/23 0509  WBC 6.1  HGB 11.9*  HCT 34.1*  PLT 352    Recent Labs    07/15/23 0509 07/16/23 0604  NA 130* 127*  K 3.6 3.9  CL 94* 95*  CO2 28 22  GLUCOSE 96 91  BUN 15 13  CREATININE 0.67 0.66  CALCIUM 10.2 10.2    Intake/Output Summary (Last 24 hours) at 07/16/2023 1158 Last data filed at 07/16/2023 0824 Gross per 24 hour  Intake 710 ml  Output --  Net 710 ml        Physical Exam: Vital Signs Blood pressure 139/77, pulse (!) 57, temperature 97.9 F (36.6 C), resp. rate 18, height 4\' 11"  (1.499 m), weight 42.3 kg, SpO2 99%.  General: Mild distress/anxiety, getting out of shower this a.m. Heart: Regular rate and rhythm ; mild systolic murmur Lungs: Clear to auscultation, breathing unlabored, no rales or wheezes.  Mild intermittent dry cough. Abdomen: Positive bowel sounds, soft nontender to palpation, nondistended Extremities: Mild edema left upper extremity. Skin: C/D/I. No apparent lesions.   Small scalp laceration-healing.  Staples removed.  MSK:      No apparent deformity.  Full passive range of motion all 4 extremities.       Neurologic exam:  Cognition: AAO to person, place, time and event without cueing. + Moderate memory deficits--ongoing + Mild left hemineglect--worse today, needs multiple stimulation to attend to left side.    Insight:  poor insight into current condition.  Mood: Moderately labile, intermittently pleasant and then tearful. Motor: Antigravity against resistance in all 4 extremities, mild left upper and lower extremity weakness 5-/5.  + Left upper extremity ataxia + Left upper extremity flexor synergy; no apparent tone or spasticity Sensory intact.    Assessment/Plan: 1. Functional deficits which require 3+ hours per day of interdisciplinary therapy in a comprehensive inpatient rehab setting. Physiatrist is providing close team supervision and 24 hour management of active medical problems listed below. Physiatrist and rehab team continue to assess barriers to discharge/monitor patient progress toward functional and medical goals  Care Tool:  Bathing    Body parts bathed by patient: Right arm, Left arm, Left upper leg, Right lower leg, Chest, Abdomen, Front perineal area, Buttocks, Right upper leg, Left lower leg, Face         Bathing assist Assist Level: Minimal Assistance - Patient > 75%     Upper Body Dressing/Undressing Upper body dressing   What is the patient wearing?: Bra, Pull over shirt    Upper body assist Assist Level: Minimal Assistance - Patient > 75%    Lower Body Dressing/Undressing Lower body dressing  What is the patient wearing?: Pants, Incontinence brief     Lower body assist Assist for lower body dressing: Minimal Assistance - Patient > 75%     Toileting Toileting    Toileting assist Assist for toileting: Total Assistance - Patient < 25%     Transfers Chair/bed transfer  Transfers assist  Chair/bed transfer activity did not occur: Safety/medical concerns  Chair/bed transfer assist level: Minimal Assistance - Patient > 75%     Locomotion Ambulation   Ambulation assist      Assist level: Minimal Assistance - Patient > 75% Assistive device: Walker-rolling Max distance: 150'   Walk 10 feet activity   Assist     Assist level:  Minimal Assistance - Patient > 75% Assistive device: Walker-rolling   Walk 50 feet activity   Assist    Assist level: Minimal Assistance - Patient > 75% Assistive device: Walker-rolling    Walk 150 feet activity   Assist    Assist level: Minimal Assistance - Patient > 75% Assistive device: Walker-rolling    Walk 10 feet on uneven surface  activity   Assist     Assist level: Minimal Assistance - Patient > 75% Assistive device: Development worker, international aid     Assist Is the patient using a wheelchair?: Yes Type of Wheelchair: Manual    Wheelchair assist level: Dependent - Patient 0% Max wheelchair distance: 150    Wheelchair 50 feet with 2 turns activity    Assist        Assist Level: Dependent - Patient 0%   Wheelchair 150 feet activity     Assist      Assist Level: Dependent - Patient 0%   Blood pressure 139/77, pulse (!) 57, temperature 97.9 F (36.6 C), resp. rate 18, height 4\' 11"  (1.499 m), weight 42.3 kg, SpO2 99%. Medical Problem List and Plan: 1. Functional deficits secondary to traumatic SDH with scalp laceration stapled in the ED as well as history Sjorgens syndrome/SLE             -patient may shower             -ELOS/Goals: 10-14 days S--3-22 DC with home health            -Stable to continue CIR  3-19: Urine culture pending, hyponatremia worsened this a.m., nightly hypertension remains with transition from amlodipine to ACE inhibitor.  Per family request, we will extend length of stay through Saturday, with expectation that left-sided hemineglect and cognition may not fully improve in that time.  2.  Antithrombotics: -DVT/anticoagulation:  Mechanical: Antiembolism stockings, thigh (TED hose) Bilateral lower extremities             -antiplatelet therapy: N/A  3. Pain Management: Tylenol as needed  -No complaints of pain, headaches. 3-14: Complaining of severe right-sided headaches, 10 out of 10, not responsive to Tylenol.  Has  documented history of allergy to Topamax and with elevated LFTs, will not schedule max dose Tylenol.  Switched to Fioricet 1-2 tabs every 6 hours as needed. -07/12/23 HA this morning, but not given fioricet yet. Asked nurse to give this. No other reports afterwards from nursing; monitor 3-18: Headaches poorly controlled with Fioricet.  Have documented allergy to Topamax, however patient and family are not aware what this is, no description comments.  Will start Topamax 25 mg twice daily, with p.o. and IV Benadryl as needed in case of allergy reaction. 3-19: No reported headaches with Topamax.  Switch Fioricet to Tylenol.  with  family concern for worsening cognition, will monitor overall attention with this and DC Topamax if necessary.  3-20: Topamax due to worsening confusion.  4. Mood/Behavior/Sleep: Trazodone 75 mg nightly.  Provide emotional support             -antipsychotic agents: Seroquel nightly as needed  -3-13: Add as needed melatonin. Add sleep log 3/14: Sleeping well overnight--did have documented use of Seroquel, no events documented overnight, spotted well.  Continue current medications. 3-18: Intermittently agitated overnight, per family get Xanax with dinner at baseline.  Will add back Xanax 0.25 mg as as needed for anxiety.  Continue sleep log. 3-19: Use Xanax and melatonin overnight; sleep improved, remains emotionally labile/anxious.  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 Neuropsych/cognition: This patient is not capable of making decisions on her own behalf. -3-14: Subarachnoid hemorrhage 3-8, should be finishing 7 days of Keppra today.  Hopefully will improve once Keppra is off.   3-18: Per family, it is approximately at cognitive baseline; ongoing left hemineglect is worse 3-19: Hemineglect appears  improved, however family concerned that still not back to where she was before rehab admission. - Continue TeleSitter for now 3-20: Worsening hemineglect today.  No other acute neurologic signs, vitally stable.  Will get repeat CT head given worsening headaches and deficits.  Treat hyponatremia as below.  6. Skin/Wound Care: Routine skin checks, should be ready for staple removal on 3/17   - 3/18: Order for staple removal placed  7. Fluids/Electrolytes/Nutrition: Routine in and outs with follow-up chemistries, continue vitamins and supplements  -07/12/23 BMP yesterday basically WNL except Ca 10.4; monitor routine labs 3-17: Mild hyponatremia as below.  P.o. intakes are stable, albumin stable at 2.8. -Hyponatremia worsened as below.  P.o. intakes do appear adequate, 75% of meals.  8.  Seizure prophylaxis.  Completed 7-day course of Keppra.   9.  History of CVA.  Plavix currently on hold due to SDH.   10.  Hyperlipidemia.  Continue Zocor 20mg  daily   11.  Hypertension.  Restart Norvasc 2.5mg  daily.    - monitor with therapies--tolerating well  -07/12/23 BPs good, monitor  3-17: Some intermittent hypertension overnight, discussed increase of Norvasc to 5 mg daily--family concerned that this this was a contributor to her fall, so we will switch from this to losartan 25 mg daily  3-19: More hypertensive overnight, normotensive this a.m.  Just started losartan today, so will monitor for effect.  May need to switch BP medications overnight.  3-20: BP improved today, but with worsening hyponatremia, will DC ACE inhibitor.  Monitor overnight and may resume amlodipine 2.5 mg in a.m.  Vitals:   07/12/23 1321 07/12/23 1806 07/13/23 0358 07/13/23 1500  BP: 137/72 (!) 151/73 (!) 157/72 (!) 152/79   07/13/23 1917 07/14/23 0409 07/14/23 1411 07/14/23 2029  BP: (!) 152/65 (!) 158/64 132/71 (!) 171/82   07/15/23 0235 07/15/23 1422 07/15/23 2115 07/16/23 0340  BP: (!) 123/95 (!) 145/85 (!) 106/54 139/77      12.  Hyponatremia.  continue sodium chloride tablets.  Follow-up chemistries - resolved on admission labs; reduce chloride tabs form TID to 1 g BID; recheck Saturday for further titration -07/12/23 labs yesterday with appropriate NaCl levels, continue dose for now . 3/ 17: Mild hyponatremia 133 this a.m.; placed 1200 cc fluid restriction and continue  salt tabs.  Repeat in 2 to 3 days 3/19: NA now down to 130; increase salt tabs to 3 times daily 3-20: NA down to 127.  Increase salt tabs to 2 tabs twice daily.  DC Topamax, trazodone, and losartan.  Urine sodium, osmolality ordered.  Chest x-ray negative.  Start IV fluids normal saline at 75 cc/h and repeat BMP every 12 hours    Latest Ref Rng & Units 07/16/2023    6:04 AM 07/15/2023    5:09 AM 07/13/2023    5:37 AM  BMP  Glucose 70 - 99 mg/dL 91  96  93   BUN 8 - 23 mg/dL 13  15  15    Creatinine 0.44 - 1.00 mg/dL 1.19  1.47  8.29   Sodium 135 - 145 mmol/L 127  130  133   Potassium 3.5 - 5.1 mmol/L 3.9  3.6  4.1   Chloride 98 - 111 mmol/L 95  94  101   CO2 22 - 32 mmol/L 22  28  29    Calcium 8.9 - 10.3 mg/dL 56.2  13.0  86.5     13.  History of membranous glomerulonephritis.  Patient not on immunosuppression followed by Dr. Clelia Schaumann   - BUN and CR stable  14. Transaminitis.   - reduce PRN tylenol dose to 325 mg   - recheck Monday-- ordered  15. GERD: continue protonix 40mg  daily  16. Constipation: -07/12/23 no BM in 3-4 days, will increase colace to BID, monitor, if no BM by tomorrow, may need sorbitol or some other stimulant laxative.  3-17: No recorded bowel movement, add Senokot S1 tablet twice daily and give as needed MiraLAX today 3/18 LBM  17. UTI/urinary urgency/incontinence   - 3-18 urinalysis significant for infection; culture not ordered with urinalysis so added on today   - Start Keflex 500 mg twice daily for 5 days; follow cultures sensitivity  3/19: Continence seems to be improving somewhat, now mixed.  Cultures  pending  3-20: Cultures negative.  DC Keflex.  LOS: 8 days A FACE TO FACE EVALUATION WAS PERFORMED  Angelina Sheriff 07/16/2023, 11:58 AM

## 2023-07-17 DIAGNOSIS — S065X0A Traumatic subdural hemorrhage without loss of consciousness, initial encounter: Secondary | ICD-10-CM

## 2023-07-17 DIAGNOSIS — E222 Syndrome of inappropriate secretion of antidiuretic hormone: Secondary | ICD-10-CM

## 2023-07-17 DIAGNOSIS — N390 Urinary tract infection, site not specified: Secondary | ICD-10-CM

## 2023-07-17 DIAGNOSIS — R32 Unspecified urinary incontinence: Secondary | ICD-10-CM

## 2023-07-17 DIAGNOSIS — N39498 Other specified urinary incontinence: Secondary | ICD-10-CM

## 2023-07-17 LAB — BASIC METABOLIC PANEL
Anion gap: 9 (ref 5–15)
Anion gap: 6 (ref 5–15)
BUN: 11 mg/dL (ref 8–23)
BUN: 15 mg/dL (ref 8–23)
CO2: 23 mmol/L (ref 22–32)
CO2: 25 mmol/L (ref 22–32)
Calcium: 10 mg/dL (ref 8.9–10.3)
Calcium: 9.5 mg/dL (ref 8.9–10.3)
Chloride: 98 mmol/L (ref 98–111)
Chloride: 97 mmol/L — ABNORMAL LOW (ref 98–111)
Creatinine, Ser: 0.78 mg/dL (ref 0.44–1.00)
Creatinine, Ser: 0.81 mg/dL (ref 0.44–1.00)
GFR, Estimated: >60 mL/min (ref >60)
GFR, Estimated: >60 mL/min (ref >60)
Glucose, Bld: 91 mg/dL (ref 70–99)
Glucose, Bld: 114 mg/dL — ABNORMAL HIGH (ref 70–99)
Potassium: 3.7 mmol/L (ref 3.5–5.1)
Potassium: 3.4 mmol/L — ABNORMAL LOW (ref 3.5–5.1)
Sodium: 130 mmol/L — ABNORMAL LOW (ref 135–145)
Sodium: 128 mmol/L — ABNORMAL LOW (ref 135–145)

## 2023-07-17 LAB — URINE CULTURE

## 2023-07-17 LAB — CREATININE, URINE, RANDOM: Creatinine, Urine: 28 mg/dL

## 2023-07-17 LAB — SODIUM, URINE, RANDOM: Sodium, Ur: 60 mmol/L

## 2023-07-17 MED ORDER — AMOXICILLIN 500 MG PO CAPS
500.0000 mg | ORAL_CAPSULE | Freq: Three times a day (TID) | ORAL | Status: AC
  Administered 2023-07-17 – 2023-07-24 (×21): 500 mg via ORAL
  Filled 2023-07-17 (×22): qty 1

## 2023-07-17 MED ORDER — SODIUM CHLORIDE 0.9 % IV SOLN: INTRAVENOUS | Status: DC

## 2023-07-17 MED ORDER — SODIUM CHLORIDE 0.9 % IV SOLN
1.0000 g | Freq: Two times a day (BID) | INTRAVENOUS | Status: DC
  Administered 2023-07-17 – 2023-07-20 (×6): 1 g via INTRAVENOUS
  Filled 2023-07-17 (×8): qty 10

## 2023-07-17 MED ORDER — ACETAMINOPHEN 500 MG PO TABS
1000.0000 mg | ORAL_TABLET | Freq: Four times a day (QID) | ORAL | Status: DC | PRN
Start: 2023-07-17 — End: 2023-07-24
  Administered 2023-07-17 – 2023-07-20 (×7): 1000 mg via ORAL
  Filled 2023-07-17 (×8): qty 2

## 2023-07-17 NOTE — Progress Notes (Signed)
 Speech Language Pathology TBI Note  Patient Details  Name: Peggy Ramirez MRN: [id number] Date of Birth: 09-21-1933  Today's Date: 07/17/2023 SLP Individual Time: 0900-1000 SLP Individual Time Calculation (min): 60 min  Short Term Goals: Week 1: SLP Short Term Goal 1 (Week 1): Pt will utilize external memory aids as needed to recall orientation information given maxA SLP Short Term Goal 2 (Week 1): Pt will utilize external memory aids to recall recent events with 25% accuracy and maxA SLP Short Term Goal 3 (Week 1): Pt will complete functional problem solving tasks with maxA SLP Short Term Goal 4 (Week 1): Pt will sustain attention during functional cognitive tasks for 5 mins given maxA  Skilled Therapeutic Interventions:   Pt greeted in her room as PT was helping her to the bathroom. Direct handoff completed and SLP assisted with remainder of transfer and toileting. She benefited from only touch assist when ambulating to/from the bathroom and modA for problem solving, sequencing, and safety awareness during toileting. She also benefited from [facility] and additional time during personal and oral care at the sink. Majority of cues required d/t sequencing deficits, however, verbal cues provided for problem solving as well. Once back in bed, she completed the remaining ~10% of her breakfast and sustained attention for 10 mins w/ only s cues. She was left in bed with the alarm set and call light within reach. She was already asleep upon SLP departure. Recommend cont ST per POC.   Pain  No pain reported  Agitated Behavior Scale: TBI Observation Details Observation Environment: CIR Start of observation period - Date: 07/17/23 Start of observation period - Time: 0903 End of observation period - Date: 07/17/23 End of observation period - Time: 1000 Agitated Behavior Scale (DO NOT LEAVE BLANKS) Short attention span, easy distractibility, inability to concentrate: Present to a slight  degree Impulsive, impatient, low tolerance for pain or frustration: Absent Uncooperative, resistant to care, demanding: Absent Violent and/or threatening violence toward people or property: Absent Explosive and/or unpredictable anger: Absent Rocking, rubbing, moaning, or other self-stimulating behavior: Absent Pulling at tubes, restraints, etc.: Absent Wandering from treatment areas: Absent Restlessness, pacing, excessive movement: Absent Repetitive behaviors, motor, and/or verbal: Present to a slight degree Rapid, loud, or excessive talking: Absent Sudden changes of mood: Absent Easily initiated or excessive crying and/or laughter: Absent Self-abusiveness, physical and/or verbal: Absent Agitated behavior scale total score: 16  Therapy/Group: Individual Therapy  [name] 07/17/2023, 9:45 AM

## 2023-07-17 NOTE — Progress Notes (Signed)
 Speech Language Pathology Weekly Progress and Session Note  Patient Details  Name: Peggy Ramirez MRN: [id number] Date of Birth: Jul 26, 1933  Beginning of progress report period: July 09, 2023 End of progress report period: July 17, 2023   Short Term Goals: Week 1: SLP Short Term Goal 1 (Week 1): Pt will utilize external memory aids as needed to recall orientation information given maxA SLP Short Term Goal 1 - Progress (Week 1): Met SLP Short Term Goal 2 (Week 1): Pt will utilize external memory aids to recall recent events with 25% accuracy and maxA SLP Short Term Goal 2 - Progress (Week 1): Progressing toward goal SLP Short Term Goal 3 (Week 1): Pt will complete functional problem solving tasks with maxA SLP Short Term Goal 3 - Progress (Week 1): Progressing toward goal SLP Short Term Goal 4 (Week 1): Pt will sustain attention during functional cognitive tasks for 5 mins given maxA SLP Short Term Goal 4 - Progress (Week 1): Progressing toward goal    New Short Term Goals: Week 2: SLP Short Term Goal 1 (Week 2): Pt will utilize external memory aids given modA to recall orientation information w/ 90% accuracy SLP Short Term Goal 2 (Week 2): Pt will complete functional problem solving tasks with maxA SLP Short Term Goal 3 (Week 2): Pt will utilize external memory aids to recall recent events with 25% accuracy and maxA SLP Short Term Goal 4 (Week 2): Pt will sustain attention during functional cognitive tasks for 5 mins given modA  Weekly Progress Updates: Limited progress this week d/t medical complications, including increase in SDH size and UTI. 3/4 STGs carried over this week given complications. However, anticipate increased success this week given medical interventions. She remains on track to meet her LTGs at this time. Pt/family education ongoing. She would benefit from continued ST to target cognitive deficits (attention, memory, orientation, problem solving, safety) and  facilitate return to previous environment, maximize pt independence, and reduce caregiver burden.    Intensity: Minumum of 1-2 x/day, 30 to 90 minutes Frequency: 3 to 5 out of 7 days Duration/Length of Stay: 3/28 Treatment/Interventions: Cognitive remediation/compensation;Internal/external aids;Cueing hierarchy;Environmental controls;Therapeutic Activities;Patient/family education;Functional tasks    [name] 07/17/2023, 4:30 PM

## 2023-07-17 NOTE — Progress Notes (Signed)
 Pharmacy Allergy Clarification  Patient with penicillin allergy listed in chart, reaction is itching. Patient is confused and unable to answer questions. Per daughter, daughter has been told her whole life that her mother has a penicillin allergy but she does not recall her mother ever having an allergic reaction. Explained that patient is growing a bacteria in her urine that best treated with amoxicillin and that people often grow out of penicillin reactions. Since her charted reaction is itching, suspect it was not a serious reaction. Daughter expresses understanding and is agreeable to patient trying amoxicillin. RN is ware and will watch for reaction. Diphenhydramine PRN is ordered.    Alphia Moh, PharmD, BCPS, BCCP Clinical Pharmacist  Please check AMION for all [facility] phone numbers After 10:00 PM, call Main Pharmacy [phone]

## 2023-07-17 NOTE — Progress Notes (Signed)
 Occupational Therapy Session Note  Patient Details  Name: Peggy Ramirez MRN: [id number] Date of Birth: 11-20-1933  Today's Date: 07/17/2023 OT Individual Time: 1400-1511 OT Individual Time Calculation (min): 71 min    Short Term Goals: Week 1:  OT Short Term Goal 1 (Week 1): Pt will perform toileting with contact guard -performing 3/3 steps OT Short Term Goal 2 (Week 1): Pt will shower sit to stand with contact guard with min cues OT Short Term Goal 3 (Week 1): Pt will pick out clothing with min cues OT Short Term Goal 4 (Week 1): Pt will transfer to toilet with LRAD with supervision OT Short Term Goal 5 (Week 1): Pt will be oriented x3 50% of the day with mod cues  Skilled Therapeutic Interventions/Progress Updates:    Pt received in the w/c with no c/o pain, daughter Peggy Ramirez present briefly but leaving after OT arrival. Pt agreeable to shower and requesting to use the bathroom. She stood from the w/c with CGA and completed functional mobility with CGA- improved from last two days, still shuffling with fixed kyphotic posture. She completed toileting tasks with min A, (+) small urine void with perseverative wiping until OT intervened. She then transferred into the shower with min A. She completed UB bathing with (S). LB with min A in standing. She returned to the w/c following. Poor sequencing/orientation when donning shirt, requiring min A and mod cueing. She frequently asked for assist before attempting task and then with encouragement was able to initiate herself. She threaded BLE into pants with poor orientation but physically able to thread. She stood with min A to pull up- OT facilitating LUE grasp on pants. Min A to don socks in figure 4 position. Pt was taken via w/c to the therapy gym for time management. Pt completed the BUE ergometer to challenge LUE NMR and endurance needed to complete ADLs and IADLs with the highest level of independence. Pt completed 5 min total, frequently changing  between forward/backward propulsion, requiring cueing for pacing and technique as well as L attention and repositioning L grasp. She then completed BITS activity to challenge L attention, scanning, and LUE NMR. She required frequent cueing for L visual scan and L hand prehension. She was able to use a large drum stick to reach screen and maintain grasp. She completed 125 ft of functional mobility back to her room with min HHA. She completed toileting tasks with similar assist and cueing as earlier. She was left supine with all needs met. Bed alarm set.   Therapy Documentation Precautions:  Precautions Precautions: None Restrictions Weight Bearing Restrictions Per Provider Order: No  Therapy/Group: Individual Therapy  [name] 07/17/2023, 2:11 PM

## 2023-07-17 NOTE — Plan of Care (Signed)
 Goals downgraded to reflect cognitive fluctuations   Problem: RH Balance Goal: LTG Patient will maintain dynamic standing with ADLs (OT) Description: LTG:  Patient will maintain dynamic standing balance with assist during activities of daily living (OT)  Flowsheets (Taken 07/17/2023 1521) LTG: Pt will maintain dynamic standing balance during ADLs with: (downgraded 3/21 -SD) Contact Guard/Touching assist   Problem: Sit to Stand Goal: LTG:  Patient will perform sit to stand in prep for activites of daily living with assistance level (OT) Description: LTG:  Patient will perform sit to stand in prep for activites of daily living with assistance level (OT) Flowsheets (Taken 07/17/2023 1521) LTG: PT will perform sit to stand in prep for activites of daily living with assistance level: (downgraded 3/21 -SD) Contact Guard/Touching assist   Problem: RH Bathing Goal: LTG Patient will bathe all body parts with assist levels (OT) Description: LTG: Patient will bathe all body parts with assist levels (OT) Flowsheets (Taken 07/17/2023 1521) LTG: Pt will perform bathing with assistance level/cueing: (downgraded 3/21 -SD) Contact Guard/Touching assist   Problem: RH Dressing Goal: LTG Patient will perform lower body dressing w/assist (OT) Description: LTG: Patient will perform lower body dressing with assist, with/without cues in positioning using equipment (OT) Flowsheets (Taken 07/17/2023 1521) LTG: Pt will perform lower body dressing with assistance level of: (downgraded 3/21 -SD) Contact Guard/Touching assist   Problem: RH Toileting Goal: LTG Patient will perform toileting task (3/3 steps) with assistance level (OT) Description: LTG: Patient will perform toileting task (3/3 steps) with assistance level (OT)  Flowsheets (Taken 07/17/2023 1521) LTG: Pt will perform toileting task (3/3 steps) with assistance level: (downgraded 3/21 -SD) Contact Guard/Touching assist   Problem: RH Toilet Transfers Goal:  LTG Patient will perform toilet transfers w/assist (OT) Description: LTG: Patient will perform toilet transfers with assist, with/without cues using equipment (OT) Flowsheets (Taken 07/17/2023 1521) LTG: Pt will perform toilet transfers with assistance level of: (downgraded 3/21 -SD) Contact Guard/Touching assist   Problem: RH Tub/Shower Transfers Goal: LTG Patient will perform tub/shower transfers w/assist (OT) Description: LTG: Patient will perform tub/shower transfers with assist, with/without cues using equipment (OT) Flowsheets (Taken 07/17/2023 1521) LTG: Pt will perform tub/shower stall transfers with assistance level of: (downgraded 3/21 -SD) Minimal Assistance - Patient > 75%   Problem: RH Memory Goal: LTG Patient will demonstrate ability for day to day recall/carry over during activities of daily living with assistance level (OT) Description: LTG:  Patient will demonstrate ability for day to day recall/carry over during activities of daily living with assistance level (OT). Flowsheets (Taken 07/17/2023 1521) LTG:  Patient will demonstrate ability for day to day recall/carry over during activities of daily living with assistance level (OT): (downgraded 3/21 -SD) Minimal Assistance - Patient > 75%   Problem: RH Attention Goal: LTG Patient will demonstrate this level of attention during functional activites (OT) Description: LTG:  Patient will demonstrate this level of attention during functional activites  (OT) Flowsheets (Taken 07/17/2023 1521) LTG: Patient will demonstrate this level of attention during functional activites (OT): (downgraded 3/21 -SD) Minimal Assistance - Patient > 75%

## 2023-07-17 NOTE — Progress Notes (Signed)
 PROGRESS NOTE  Peggy Ramirez  DOB: 06/03/1933  PCP: Estanislado Pandy, MD ZOX:[id number]  DOA: 07/08/2023  LOS: 9 days  Hospital Day: 10  Brief narrative: Peggy Ramirez is a 89 y.o. female with PMH significant for traumatic subdural hematoma on seizure prophylaxis medications, lupus, Sjogren syndrome, membranous glomerulonephritis, history of CVA Patient is currently in CIR for last 2 weeks after hospitalization for subdural hematoma.  She has been improving in the rehab but her labs have been showing dropping sodium level.  Her mental status also declined, raising suspicion of UTI and hence hospitalist service was consulted on 3/20. Of note, repeat CT head 3/26 showed enlarging subdural hematoma with midline shift  Subjective: Patient was seen and examined this morning. Lying on bed.  Open eyes to command.  Oriented to place, person and month.  Assessment and plan: Acute metabolic encephalopathy Patient was significantly altered yesterday 3/20.   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  Was also started on salt tablet yesterday Sodium level gradually improving.  Continue to monitor Recent Labs  Lab 07/11/23 0523 07/13/23 0537 07/15/23 0509 07/16/23 0604 07/16/23 1655 07/16/23 2204 07/17/23 0937  NA 138 133* 130* 127* 124* 125* 130*   UTI   >100000 Enterococcus and Pseudomonas.   Started on meropenem last night. Follow-up culture report   Subdural hematoma with midline shift Follow-up CT head showed increasing size with increased midline shift   Mobility: Encourage ambulation  Goals of care   Code Status: Full Code     DVT prophylaxis:  Place TED hose Start: 07/08/23 1925   Antimicrobials: Meropenem IV Fluid: None Family Communication: None at bedside  Level of care:    Currently in CIR.  No need to transfer the hospital service    Diet:  Diet Order             Diet regular Fluid consistency: Thin; Fluid restriction: 1200 mL Fluid  Diet effective now                   Scheduled Meds:  vitamin C  1,000 mg Oral Daily   Carboxymethylcellul-Glycerin  1 drop Ophthalmic QID   docusate sodium  100 mg Oral BID   melatonin  5 mg Oral QHS   multivitamin with minerals  1 tablet Oral Daily   senna-docusate  1 tablet Oral BID   simvastatin  20 mg Oral Daily   sodium chloride  2 g Oral BID WC    PRN meds: acetaminophen, ALPRAZolam, diphenhydrAMINE **OR** diphenhydrAMINE, polyethylene glycol, polyvinyl alcohol   Infusions:   meropenem (MERREM) IV 500 mg (07/17/23 8119)    Antimicrobials: Anti-infectives (From admission, onward)    Start     Dose/Rate Route Frequency Ordered Stop   07/17/23 0045  meropenem (MERREM) 500 mg in sodium chloride 0.9 % 100 mL IVPB        500 mg 200 mL/hr over 30 Minutes Intravenous Every 12 hours 07/16/23 2350 07/21/23 2359   07/16/23 0000  cephALEXin (KEFLEX) 500 MG capsule        500 mg Oral Every 12 hours 07/16/23 0518     07/14/23 2000  cephALEXin (KEFLEX) capsule 500 mg  Status:  Discontinued        500 mg Oral Every 12 hours 07/14/23 1344 07/16/23 0925       Objective: Vitals:  07/17/23 0546 07/17/23 1310  BP: (!) 147/56 (!) 129/59  Pulse: (!) 54 73  Resp: 15 16  Temp: 99.3 F (37.4 C) 98.9 F (37.2 C)  SpO2: 99% 100%    Intake/Output Summary (Last 24 hours) at 07/17/2023 1426 Last data filed at 07/17/2023 1311 Gross per 24 hour  Intake 1831.33 ml  Output 229 ml  Net 1602.33 ml   Filed Weights   07/15/23 0235 07/15/23 1925 07/16/23 0500  Weight: 42.3 kg 42.3 kg 42.3 kg   Weight change:  Body mass index is 18.84 kg/m.   Physical Exam: General exam: Pleasant, elderly Caucasian female.  Not in pain Skin: No rashes, lesions or ulcers. HEENT: Atraumatic, normocephalic, no obvious  bleeding Lungs: Clear to auscultation bilaterally,  CVS: S1, S2, no murmur,   GI/Abd: Soft, nontender, nondistended, bowel sound present,   CNS: Opens eyes on command.  Oriented to place, person.  Knows it is in the month of March Psychiatry: Mood appropriate,  Extremities: No pedal edema, no calf tenderness,   Data Review: I have personally reviewed the laboratory data and studies available.  F/u labs ordered Unresulted Labs (From admission, onward)     Start     Ordered   07/16/23 1700  Basic metabolic panel  5A & 5P,   R      07/16/23 0929   07/13/23 0500  CBC  Every Monday (0500),   R     Question:  Specimen collection method  Answer:  Lab=Lab collect   07/12/23 1218   07/13/23 0500  Comprehensive metabolic panel  Every Monday (0272),   R      07/12/23 1220           Total time spent in review of labs and imaging, patient evaluation, formulation of plan, documentation and communication with family: 45 minutes  Signed, Lorin Glass, MD [facility] 07/17/2023

## 2023-07-17 NOTE — Progress Notes (Signed)
 PROGRESS NOTE   Subjective/Complaints:  Overnight, patient with mildly low diastolic blood pressure, max 99.5.  Started on IV cefepime due to urine cultures returning with Pseudomonas and Enterococcus after reincubation.  Stayed on IV fluids overnight for hyponatremia, which appears improved this a.m.  patient much more awake, alert, attentive, and less anxious today.  Denies headache, states she needs to pee.   ROS: Denies fevers, chills, CP, SOB, diarrhea.   + Headache -severe, right-sided--not currently present + Lower abdominal pain/nausea--no longer present + Confusion, ongoing--improved + anxiety  Objective:   CT HEAD WO CONTRAST ( ) Result Date: 07/16/2023 CLINICAL DATA:  Headache, neuro deficit. Follow-up subdural hematoma. EXAM: CT HEAD WITHOUT CONTRAST TECHNIQUE: Contiguous axial images were obtained from the base of the skull through the vertex without intravenous contrast. RADIATION DOSE REDUCTION: This exam was performed according to the departmental dose-optimization program which includes automated exposure control, adjustment of the mA and/or kV according to patient size and/or use of iterative reconstruction technique. COMPARISON:  CT head without contrast 07/13/2023 and 07/04/2023. FINDINGS: Brain: The subdural hematoma over the right convexity has increased in size, measuring 13 mm maximally compared with 10.5 mm maximally on the prior study. The more hyperdense area is stable. Like scratched at the collection extends lateral to the temporal lobe more inferiorly. Partial effacement of the right lateral ventricle site scratched at slight mass effect on right lateral ventricle partial effacement the sulci has increased. Significant midline shift is present. Moderate generalized atrophy and white matter disease is otherwise stable. Remote infarct in the anterior left frontal lobe is stable. Deep brain nuclei are within  normal limits. The brainstem and cerebellum are within normal limits. Midline structures are within normal limits. Vascular: Atherosclerotic calcifications are present within the cavernous internal carotid arteries bilaterally. No hyperdense vessel is present. Skull: Calvarium is intact. No focal lytic or blastic lesions are present. No significant extracranial soft tissue lesion is present. Sinuses/Orbits: The paranasal sinuses and mastoid air cells are clear. Bilateral lens replacements are noted. Globes and orbits are otherwise unremarkable. IMPRESSION: 1. Interval increase in size of subdural hematoma over the right convexity, measuring 13 mm maximally compared with 10.5 mm maximally on the prior study. 2. Increased mass effect on the right lateral ventricle and partial effacement of the sulci. 3. No significant midline shift. 4. Stable moderate generalized atrophy and white matter disease. This likely reflects the sequela of chronic microvascular ischemia. 5. Stable remote infarct in the anterior left frontal lobe. Critical Value/emergent results were called by telephone at the time of interpretation on 07/16/2023 at 4:11 pm to provider [facility] , who verbally acknowledged these results. Electronically Signed   By: Marin Roberts M.D.   On: 07/16/2023 16:12   DG CHEST PORT 1 VIEW Result Date: 07/16/2023 CLINICAL DATA:  Cough EXAM: PORTABLE CHEST 1 VIEW COMPARISON:  X-ray 07/14/2022 FINDINGS: Normal cardiopericardial silhouette tortuous aorta. Kyphotic x-ray obscures the apices. No pneumothorax, edema or consolidation. Slight blunting of left costophrenic angle. Tiny left effusion is possible. Prominent calcifications along the tracheobronchial tree. IMPRESSION: Kyphotic x-ray.  Question tiny left effusion. Electronically Signed   By: Karen Kays M.D.   On: 07/16/2023 13:42  Recent Labs    07/15/23 0509  WBC 6.1  HGB 11.9*  HCT 34.1*  PLT 352    Recent Labs    07/16/23 0604  07/16/23 1655 07/16/23 2204  NA 127* 124* 125*  K 3.9 3.6  --   CL 95* 92*  --   CO2 22 22  --   GLUCOSE 91 88  --   BUN 13 13  --   CREATININE 0.66 0.66  --   CALCIUM 10.2 9.5  --     Intake/Output Summary (Last 24 hours) at 07/17/2023 0946 Last data filed at 07/17/2023 0600 Gross per 24 hour  Intake 1311.33 ml  Output 229 ml  Net 1082.33 ml        Physical Exam: Vital Signs Blood pressure (!) 147/56, pulse (!) 54, temperature 99.3 F (37.4 C), resp. rate 15, height 4\' 11"  (1.499 m), weight 42.3 kg, SpO2 99%.  General: No apparent distress.  Laying in bed. Heart: Regular rate and rhythm ; mild systolic murmur Lungs: Clear to auscultation, breathing unlabored, no rales or wheezes.  Abdomen: Positive bowel sounds, soft nontender to palpation, nondistended Extremities: Decreasing edema in left upper extremity; otherwise no edema or deformity Skin: C/D/I. No apparent lesions.   Small scalp laceration-healing.  Staples removed.  MSK:      No apparent deformity.  Full passive range of motion all 4 extremities.       Neurologic exam:  Cognition: AAO to person, place, time and event without cueing. + Moderate memory deficits--ongoing + Mild left hemineglect--back to prior baseline today  Insight:  poor insight into current condition.  Mood: Moderately labile, intermittently pleasant and then tearful. Motor: Antigravity against resistance in all 4 extremities, mild left upper and lower extremity weakness 5-/5.  + Left upper extremity ataxia + Left upper extremity flexor synergy; no apparent tone or spasticity--better able to overcome 3-21 Sensory intact.    Assessment/Plan: 1. Functional deficits which require 3+ hours per day of interdisciplinary therapy in a comprehensive inpatient rehab setting. Physiatrist is providing close team supervision and 24 hour management of active medical problems listed below. Physiatrist and rehab team continue to assess barriers to  discharge/monitor patient progress toward functional and medical goals  Care Tool:  Bathing    Body parts bathed by patient: Right arm, Left arm, Left upper leg, Right lower leg, Chest, Abdomen, Front perineal area, Buttocks, Right upper leg, Left lower leg, Face         Bathing assist Assist Level: Minimal Assistance - Patient > 75%     Upper Body Dressing/Undressing Upper body dressing   What is the patient wearing?: Bra, Pull over shirt    Upper body assist Assist Level: Minimal Assistance - Patient > 75%    Lower Body Dressing/Undressing Lower body dressing      What is the patient wearing?: Pants, Incontinence brief     Lower body assist Assist for lower body dressing: Minimal Assistance - Patient > 75%     Toileting Toileting    Toileting assist Assist for toileting: Total Assistance - Patient < 25%     Transfers Chair/bed transfer  Transfers assist  Chair/bed transfer activity did not occur: Safety/medical concerns  Chair/bed transfer assist level: Minimal Assistance - Patient > 75%     Locomotion Ambulation   Ambulation assist      Assist level: Minimal Assistance - Patient > 75% Assistive device: Walker-rolling Max distance: 150'   Walk 10 feet activity   Assist  Assist level: Minimal Assistance - Patient > 75% Assistive device: Walker-rolling   Walk 50 feet activity   Assist    Assist level: Minimal Assistance - Patient > 75% Assistive device: Walker-rolling    Walk 150 feet activity   Assist    Assist level: Minimal Assistance - Patient > 75% Assistive device: Walker-rolling    Walk 10 feet on uneven surface  activity   Assist     Assist level: Minimal Assistance - Patient > 75% Assistive device: Walker-rolling   Wheelchair     Assist Is the patient using a wheelchair?: Yes Type of Wheelchair: Manual    Wheelchair assist level: Dependent - Patient 0% Max wheelchair distance: 150    Wheelchair 50  feet with 2 turns activity    Assist        Assist Level: Dependent - Patient 0%   Wheelchair 150 feet activity     Assist      Assist Level: Dependent - Patient 0%   Blood pressure (!) 147/56, pulse (!) 54, temperature 99.3 F (37.4 C), resp. rate 15, height 4\' 11"  (1.499 m), weight 42.3 kg, SpO2 99%. Medical Problem List and Plan: 1. Functional deficits secondary to traumatic SDH with scalp laceration stapled in the ED as well as history Sjorgens syndrome/SLE             -patient may shower             -ELOS/Goals: 10-14 days S- DC held due to increased SDH and hyponatremia as below, tentatively 3-29             -Stable to continue CIR  3-19: Urine culture pending, hyponatremia worsened this a.m., nightly hypertension remains with transition from amlodipine to ACE inhibitor.  Per family request, we will extend length of stay through Saturday, with expectation that left-sided hemineglect and cognition may not fully improve in that time.  3-20: CT head with increasing SDH from 10.5 mm to 13 mm, with increased mass effect on the right lateral ventricle and partial sulci effacement.  Discussed with Dr. Maisie Fus, do not feel urgent intervention is warranted, recommended repeat CT head in 1 week to see if bleed has liquefied and possible burr hole evacuation at that time.  Monitor closely for status changes--clinically improved 3-21  2.  Antithrombotics: -DVT/anticoagulation:  Mechanical: Antiembolism stockings, thigh (TED hose) Bilateral lower extremities             -antiplatelet therapy: N/A  3. Pain Management: Tylenol as needed  -No complaints of pain, headaches. 3-14: Complaining of severe right-sided headaches, 10 out of 10, not responsive to Tylenol.  Has documented history of allergy to Topamax and with elevated LFTs, will not schedule max dose Tylenol.  Switched to Fioricet 1-2 tabs every 6 hours as needed. -07/12/23 HA this morning, but not given fioricet yet. Asked nurse  to give this. No other reports afterwards from nursing; monitor 3-18: Headaches poorly controlled with Fioricet.  Have documented allergy to Topamax, however patient and family are not aware what this is, no description comments.  Will start Topamax 25 mg twice daily, with p.o. and IV Benadryl as needed in case of allergy reaction. 3-19: No reported headaches with Topamax.  Switch Fioricet to Tylenol.  with family concern for worsening cognition, will monitor overall attention with this and DC Topamax if necessary.  3-20: DC Topamax due to worsening confusion.--Has Tylenol as needed for headaches  4. Mood/Behavior/Sleep: Trazodone 75 mg nightly.  Provide emotional support             -  antipsychotic agents: Seroquel nightly as needed  -3-13: Add as needed melatonin. Add sleep log 3/14: Sleeping well overnight--did have documented use of Seroquel, no events documented overnight, spotted well.  Continue current medications. 3-18: Intermittently agitated overnight, per family get Xanax with dinner at baseline.  Will add back Xanax 0.25 mg as as needed for anxiety.  Continue sleep log. 3-19: Use Xanax and melatonin overnight; sleep improved, remains emotionally labile/anxious.  Agree with therapy assessment that this is likely to only improve when she is back in a home environment 3-20: DC trazodone due to possible contributor to SIADH; continue with nightly melatonin and as needed Xanax (home medication)  5. Neuropsych/cognition: This patient is not capable of making decisions on her own behalf. -3-14: Subarachnoid hemorrhage 3-8, should be finishing 7 days of Keppra today.  Hopefully will improve once Keppra is off.   3-18: Per family, it is approximately at cognitive baseline; ongoing left hemineglect is worse 3-19: Hemineglect appears improved, however family concerned that still not back to where she was before rehab admission. - Continue TeleSitter for now 3-20: Worsening hemineglect today.  No  other acute neurologic signs, vitally stable.  Will get repeat CT head given worsening headaches and deficits.  Treat hyponatremia as below. 3-21: CT head as above with worsening SDH, mass effect from 10 to 13 mm.  Discussed with Dr. Maisie Fus, neurosurgery.  Treating UTI and SIADH as below; seems to be improving her cognition.  6. Skin/Wound Care: Routine skin checks, should be ready for staple removal on 3/17   - 3/18: Order for staple removal placed  7. Fluids/Electrolytes/Nutrition: Routine in and outs with follow-up chemistries, continue vitamins and supplements  -07/12/23 BMP yesterday basically WNL except Ca 10.4; monitor routine labs 3-17: Mild hyponatremia as below.  P.o. intakes are stable, albumin stable at 2.8. -Hyponatremia as below.  P.o. intakes do appear adequate, 75% of meals.  8.  Seizure prophylaxis.  Completed 7-day course of Keppra.   9.  History of CVA.  Plavix currently on hold due to SDH.   10.  Hyperlipidemia.  Continue Zocor 20mg  daily   11.  Hypertension.  Restart Norvasc 2.5mg  daily.    - monitor with therapies--tolerating well  -07/12/23 BPs good, monitor  3-17: Some intermittent hypertension overnight, discussed increase of Norvasc to 5 mg daily--family concerned that this this was a contributor to her fall, so we will switch from this to losartan 25 mg daily  3-19: More hypertensive overnight, normotensive this a.m.  Just started losartan today, so will monitor for effect.  May need to switch BP medications overnight.  3-20: BP improved today, but with worsening hyponatremia, will DC ACE inhibitor.  Monitor overnight and may resume amlodipine 2.5 mg in a.m.  3-21: Diastolic remains soft, continue IV fluids and holding amlodipine.   Vitals:   07/13/23 0358 07/13/23 1500 07/13/23 1917 07/14/23 0409  BP: (!) 157/72 (!) 152/79 (!) 152/65 (!) 158/64   07/14/23 1411 07/14/23 2029 07/15/23 0235 07/15/23 1422  BP: 132/71 (!) 171/82 (!) 123/95 (!) 145/85   07/15/23 2115  07/16/23 0340 07/16/23 2122 07/17/23 0546  BP: (!) 106/54 139/77 (!) 139/59 (!) 147/56     12.  Hyponatremia/SIADH.  continue sodium chloride tablets.  Follow-up chemistries - resolved on admission labs; reduce chloride tabs form TID to 1 g BID; recheck Saturday for further titration -07/12/23 labs yesterday with appropriate NaCl levels, continue dose for now . 3/ 17: Mild hyponatremia 133 this a.m.; placed 1200 cc fluid restriction  and continue salt tabs.  Repeat in 2 to 3 days 3/19: NA now down to 130; increase salt tabs to 3 times daily 3-20: NA down to 127.  Increase salt tabs to 2 tabs twice daily.  DC Topamax, trazodone, and losartan.  Urine sodium, osmolality ordered.  Chest x-ray negative.  Start IV fluids normal saline at 75 cc/h and repeat BMP every 12 hours 3-21: NA down to 124 overnight after 3 hours of IV fluid and increasing salt tabs; hospitalist did, appreciate recommendations and care.  Urine studies back, consistent with SIADH.  Without further adjustment, sodium improved to 130 this a.m.  Repeat BMP this afternoon and daily over the weekend to ensure stability, hospitalist to monitor over the weekend     Latest Ref Rng & Units 07/16/2023   10:04 PM 07/16/2023    4:55 PM 07/16/2023    6:04 AM  BMP  Glucose 70 - 99 mg/dL  88  91   BUN 8 - 23 mg/dL  13  13   Creatinine 1.61 - 1.00 mg/dL  0.96  0.45   Sodium 409 - 145 mmol/L 125  124  127   Potassium 3.5 - 5.1 mmol/L  3.6  3.9   Chloride 98 - 111 mmol/L  92  95   CO2 22 - 32 mmol/L  22  22   Calcium 8.9 - 10.3 mg/dL  9.5  81.1     13.  History of membranous glomerulonephritis.  Patient not on immunosuppression followed by Dr. Clelia Schaumann   - BUN and CR stable  14. Transaminitis.   - reduce PRN tylenol dose to 325 mg   - recheck Monday-- ordered  15. GERD: continue protonix 40mg  daily  16. Constipation: -07/12/23 no BM in 3-4 days, will increase colace to BID, monitor, if no BM by tomorrow, may need sorbitol or some  other stimulant laxative.  3-17: No recorded bowel movement, add Senokot S1 tablet twice daily and give as needed MiraLAX today 3/18 LBM  17. UTI/urinary urgency/incontinence   - 3-18 urinalysis significant for infection; culture not ordered with urinalysis so added on today   - Start Keflex 500 mg twice daily for 5 days; follow cultures sensitivity  3/19: Continence seems to be improving somewhat, now mixed.  Cultures pending  3-20: Cultures NGTD.  DC Keflex.  3-21: Cultures reincubated and yesterday afternoon grew Pseudomonas and Enterococcus.  Patient started on IV Merrem 500 mg every 12 per hospitalist, with good effect.  Would continue IV given history sulfa and penicillin allergies for at least 5 days.  Appreciate hospitalist input.  LOS: 9 days A FACE TO FACE EVALUATION WAS PERFORMED  Angelina Sheriff 07/17/2023, 9:46 AM

## 2023-07-17 NOTE — Progress Notes (Addendum)
 Patient ID: Peggy Ramirez, female   DOB: 01-11-1934, 89 y.o.   MRN: [id number]  SW received updates from attending pt has medical concerns preventing from discharge. D/c extended to 3/29.   0947- SW called pt son [name] to inform on change in d/c date. Recalls speaking to PA-Dan yesterday about CT scan. SW shared will provide updates next week.    HHA- Angie/[facility] does not have speech at this time. SLP reports speech is needed.   SW sent [facility] referral to Cheryl[facility] and waiting on follow-up *No SLP in area.   SW sent referral to Calvin./[facility] and waiting on follow-up.   Cecile Sheerer, MSW, LCSW Office: [phone] Cell: [phone] Fax: [phone]

## 2023-07-17 NOTE — Progress Notes (Signed)
 Occupational Therapy Weekly Progress Note  Patient Details  Name: Peggy Ramirez MRN: [id number] Date of Birth: 1933-07-22  Beginning of progress report period: July 09, 2023 End of progress report period: July 17, 2023   Patient has met 3 of 5 short term goals.  [name] has had ongoing medical issues that have led to large fluctuations in her functional performance. She is often as good as supervision level with ADLs and transfers but with ongoing sodium and now worsening SDH her LUE has been more ataxic and L inattention worse and she is more at a CGA level. Family education was completed with her children. She is on a medical hold for d/c.   Patient continues to demonstrate the following deficits: muscle weakness, decreased cardiorespiratoy endurance, decreased attention to left, decreased initiation, decreased attention, decreased awareness, decreased problem solving, decreased safety awareness, decreased memory, delayed processing, and demonstrates behaviors consistent with [place] Level VI, and decreased sitting balance, decreased standing balance, decreased postural control, hemiplegia, and decreased balance strategies and therefore will continue to benefit from skilled OT intervention to enhance overall performance with BADL and iADL.  Patient not progressing toward long term goals.  See goal revision..  Plan of care revisions: Downgraded to CGA.  OT Short Term Goals Week 1:  OT Short Term Goal 1 (Week 1): Pt will perform toileting with contact guard -performing 3/3 steps OT Short Term Goal 1 - Progress (Week 1): Progressing toward goal OT Short Term Goal 2 (Week 1): Pt will shower sit to stand with contact guard with min cues OT Short Term Goal 2 - Progress (Week 1): Met OT Short Term Goal 3 (Week 1): Pt will pick out clothing with min cues OT Short Term Goal 3 - Progress (Week 1): Met OT Short Term Goal 4 (Week 1): Pt will transfer to toilet with LRAD with supervision OT Short  Term Goal 4 - Progress (Week 1): Progressing toward goal OT Short Term Goal 5 (Week 1): Pt will be oriented x3 50% of the day with mod cues OT Short Term Goal 5 - Progress (Week 1): Met Week 2:  OT Short Term Goal 1 (Week 2): STG= LTG d/t ELOS  [name] 07/17/2023, 3:00 PM

## 2023-07-17 NOTE — Progress Notes (Signed)
 [facility]AST consult for PIV placement. Arrived to unit. Patient getting up to bathroom. Notified RN to consult VAST when patient back to bed. Tomasita Morrow, RN VAST

## 2023-07-17 NOTE — Progress Notes (Signed)
 Physical Therapy Weekly Progress Note  Patient Details  Name: FAYTH TREFRY MRN: [id number] Date of Birth: April 10, 1934  Beginning of progress report period: July 09, 2023 End of progress report period: July 17, 2023  Today's Date: 07/17/2023 PT Individual Time: 7829-5621 PT Individual Time Calculation (min): 54 min   Today's Date: 07/17/2023 PT Individual Time: 1305-1330 PT Individual Time Calculation (min): 25 min   Patient has met 2 of 4 short term goals.  Pt is progressing toward mobility goals, improving independence with bed mobility and transfers, but not meeting ambulation goals secondary to fluctuations in presentation. Pt has been able to ambulate with CGA at times, but has also required up to modA at other times when it appears that anxiety or other internal factors affect safety awareness and presentation. Pt has received family education and will continue to benefit from skilled PT.   Patient continues to demonstrate the following deficits muscle weakness, decreased cardiorespiratoy endurance, decreased coordination, decreased attention to left, decreased attention, decreased awareness, decreased problem solving, decreased safety awareness, decreased memory, and delayed processing, and decreased sitting balance, decreased standing balance, decreased postural control, hemiplegia, and decreased balance strategies and therefore will continue to benefit from skilled PT intervention to increase functional independence with mobility.  Patient progressing toward long term goals..  Continue plan of care.  PT Short Term Goals Week 1:  PT Short Term Goal 1 (Week 1): Pt will complete bed mobility with CGA. PT Short Term Goal 1 - Progress (Week 1): Met PT Short Term Goal 2 (Week 1): Pt will complete sit to stand transfer with CGA. PT Short Term Goal 2 - Progress (Week 1): Progressing toward goal PT Short Term Goal 3 (Week 1): Pt will complete bed to chair transfer with minA. PT Short  Term Goal 3 - Progress (Week 1): Met PT Short Term Goal 4 (Week 1): Pt will ambulate x150' with CGA and LRAD. PT Short Term Goal 4 - Progress (Week 1): Progressing toward goal Week 2:  PT Short Term Goal 1 (Week 2): STGs = LTGs  Skilled Therapeutic Interventions/Progress Updates:  Ambulation/gait training;Community reintegration;DME/adaptive equipment instruction;Neuromuscular re-education;Psychosocial support;Stair training;UE/LE Strength taining/ROM;Balance/vestibular training;Discharge planning;Functional electrical stimulation;Pain management;UE/LE Coordination activities;Therapeutic Activities;Skin care/wound management;Cognitive remediation/compensation;Disease management/prevention;Visual/perceptual remediation/compensation;Therapeutic Exercise;Splinting/orthotics;Patient/family education;Functional mobility training   1st Session: Pt received semi reclined in bed and eating breakfast. Pt is agreeable to therapy and does not complain of pain. Pt does request to void bladder. Pt performs supine to sit with cues for sequencing and edge of bed. PT assists to don shoes. Pt performs sit to stand with CGA/minA to gently facilitate anterior weight shift. Pt ambulates to toilet with CGA/minA at trunk for stability, with cues for upright gaze to improve posture and balance, as well as cueing for positioning to safely transfer to toilet. Following toileting pt performs sit to stand with minA and PT assists to pull up brief. Pt ambulates to sink to wash hands with minA for navigation and safety. Following, PT guides pt ambulating backward with minA to transition to sitting in WC. WC transport to gym. Pt transfers to Nustep with minA bilateral HHA, with cues for upright posture and safe positioning. Pt completes x10:00 at workload of 4 with average steps per minute ~40. PT provides cues for hand and foot placement and completing full available ROM. Pt ambulates back to room with same assistance and cues. Left  seated in WC with alarm intact and all needs within reach.   2nd Session: Pt received seated in [facility]  and agrees to therapy. No complaint of pain but does want to go to the bathroom. Pt ambulates to sink with minA and cues for posture and positioning. PT assists to reach outside base of support to reach for soap to wash hands. Pt then ambulates x250' with CGA/minA and cues for upright gaze to improve posture and balance, and increasing gait speed and stride length to decrease risk for falls. Following rest break, pt stands and ambulates additional x150' with same assistance and cues. Left seated in WC with alarm intact and all needs within reach.   Therapy Documentation Precautions:  Precautions Precautions: None Restrictions Weight Bearing Restrictions Per Provider Order: No   Therapy/Group: Individual Therapy  Beau Fanny, PT, DPT 07/17/2023, 3:43 PM

## 2023-07-18 ENCOUNTER — Other Ambulatory Visit (HOSPITAL_COMMUNITY): Payer: Self-pay

## 2023-07-18 LAB — BASIC METABOLIC PANEL
Anion gap: 6 (ref 5–15)
BUN: 11 mg/dL (ref 8–23)
CO2: 23 mmol/L (ref 22–32)
Calcium: 9.9 mg/dL (ref 8.9–10.3)
Chloride: 104 mmol/L (ref 98–111)
Creatinine, Ser: 0.62 mg/dL (ref 0.44–1.00)
GFR, Estimated: >60 mL/min (ref >60)
Glucose, Bld: 89 mg/dL (ref 70–99)
Potassium: 3.3 mmol/L — ABNORMAL LOW (ref 3.5–5.1)
Sodium: 133 mmol/L — ABNORMAL LOW (ref 135–145)

## 2023-07-18 MED ORDER — POTASSIUM CHLORIDE CRYS ER 10 MEQ PO TBCR
40.0000 meq | EXTENDED_RELEASE_TABLET | Freq: Once | ORAL | Status: AC
  Administered 2023-07-18: 40 meq via ORAL
  Filled 2023-07-18: qty 4

## 2023-07-18 MED ORDER — SODIUM CHLORIDE 1 G PO TABS
1.0000 g | ORAL_TABLET | Freq: Every day | ORAL | Status: DC
  Administered 2023-07-19 – 2023-07-23 (×5): 1 g via ORAL
  Filled 2023-07-18 (×5): qty 1

## 2023-07-18 NOTE — Progress Notes (Addendum)
 PROGRESS NOTE  Peggy Ramirez  DOB: 11-10-1933  PCP: Estanislado Pandy, MD ZOX:[id number]  DOA: 07/08/2023  LOS: 10 days  Hospital Day: 11  Brief narrative: Peggy Ramirez is a 89 y.o. female with PMH significant for traumatic subdural hematoma on seizure prophylaxis medications, lupus, Sjogren syndrome, membranous glomerulonephritis, history of CVA Patient is currently in CIR for last 2 weeks after hospitalization for subdural hematoma.  She has been improving in the rehab but her labs have been showing dropping sodium level.  Her mental status also declined, raising suspicion of UTI and hence hospitalist service was consulted on 3/20. Of note, repeat CT head 3/26 showed enlarging subdural hematoma with midline shift  Subjective: Patient was seen and examined this morning. Lying on bed.  Opens eyes to command.  Has lost speech.  Oriented to place and person. Sodium level improving. Urine culture grew more than 1000 CFU per mL of Pseudomonas aeruginosa Enterococcus faecalis  Assessment and plan: Acute metabolic encephalopathy Patient was significantly altered yesterday 3/20.   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 Sodium level gradually improving.  Adequately hydrated.  Stop IV fluid today.  Reduce salt tablet to 1 g daily.  Continue to monitor Recent Labs  Lab 07/13/23 0537 07/15/23 0509 07/16/23 0604 07/16/23 1655 07/16/23 2204 07/17/23 0937 07/17/23 1546 07/18/23 0538  NA 133* 130* 127* 124* 125* 130* 128* 133*   UTI  Urine culture grew>100000 Enterococcus and Pseudomonas.   Was initially started on meropenem.   Currently on oral amoxicillin and IV cefepime Follow-up culture report   Subdural hematoma with midline shift Follow-up CT head showed increasing size with increased  midline shift.  Per rehab team, this was discussed with neurosurgeon.  Recommended no surgical intervention.  Hypokalemia Potassium level was low at 3.3 today.  Replacement given. Recent Labs  Lab 07/16/23 0604 07/16/23 1655 07/17/23 0937 07/17/23 1546 07/18/23 0538  K 3.9 3.6 3.7 3.4* 3.3*   Mobility: Encourage ambulation  Goals of care   Code Status: Full Code     DVT prophylaxis:  Place TED hose Start: 07/08/23 1925   Antimicrobials: Oral amoxicillin and IV cefepime Fluid: None Family Communication: None at bedside  Level of care:   Currently in CIR.  No need to transfer the hospital service. Will continue to follow for next 1 to 2 days    Diet:  Diet Order             Diet regular Fluid consistency: Thin; Fluid restriction: 1200 mL Fluid  Diet effective now                   Scheduled Meds:  amoxicillin  500 mg Oral Q8H   vitamin C  1,000 mg Oral Daily   Carboxymethylcellul-Glycerin  1 drop Ophthalmic QID   docusate sodium  100 mg Oral BID   melatonin  5 mg Oral QHS   multivitamin with minerals  1 tablet Oral Daily   senna-docusate  1 tablet Oral BID   simvastatin  20 mg Oral Daily   [START ON 07/19/2023] sodium chloride  1 g Oral Daily    PRN meds: acetaminophen, ALPRAZolam, diphenhydrAMINE **OR** diphenhydrAMINE, polyethylene glycol, polyvinyl alcohol   Infusions:   ceFEPime (MAXIPIME) IV 1 g (07/18/23 1029)    Antimicrobials: Anti-infectives (From admission, onward)    Start  Dose/Rate Route Frequency Ordered Stop   07/17/23 2200  amoxicillin (AMOXIL) capsule 500 mg        500 mg Oral Every 8 hours 07/17/23 1626     07/17/23 2000  ceFEPIme (MAXIPIME) 1 g in sodium chloride 0.9 % 100 mL IVPB        1 g 200 mL/hr over 30 Minutes Intravenous Every 12 hours 07/17/23 1626     07/17/23 0045  meropenem (MERREM) 500 mg in sodium chloride 0.9 % 100 mL IVPB  Status:  Discontinued        500 mg 200 mL/hr over 30 Minutes Intravenous Every 12  hours 07/16/23 2350 07/17/23 1626   07/16/23 0000  cephALEXin (KEFLEX) 500 MG capsule        500 mg Oral Every 12 hours 07/16/23 0518     07/14/23 2000  cephALEXin (KEFLEX) capsule 500 mg  Status:  Discontinued        500 mg Oral Every 12 hours 07/14/23 1344 07/16/23 0925       Objective: Vitals:   07/18/23 1251 07/18/23 1345  BP: (!) 113/93 (!) 168/67  Pulse: 80 82  Resp: 16 16  Temp: 98.7 F (37.1 C) 99.4 F (37.4 C)  SpO2: 100% 100%    Intake/Output Summary (Last 24 hours) at 07/18/2023 1422 Last data filed at 07/18/2023 1254 Gross per 24 hour  Intake 470.73 ml  Output --  Net 470.73 ml   Filed Weights   07/15/23 1925 07/16/23 0500 07/18/23 0500  Weight: 42.3 kg 42.3 kg 43.5 kg   Weight change:  Body mass index is 19.37 kg/m.   Physical Exam: General exam: Pleasant, elderly Caucasian female.  Not in pain Skin: No rashes, lesions or ulcers. HEENT: Atraumatic, normocephalic, no obvious bleeding Lungs: Clear to auscultation bilaterally,  CVS: S1, S2, no murmur,   GI/Abd: Soft, nontender, nondistended, bowel sound present,   CNS: Opens eyes on command.  Oriented to place, person.  Knows it is in the month of March Psychiatry: Mood appropriate,  Extremities: No pedal edema, no calf tenderness,   Data Review: I have personally reviewed the laboratory data and studies available.  F/u labs ordered Unresulted Labs (From admission, onward)     Start     Ordered   07/17/23 1456  Basic metabolic panel  Daily,   R      07/17/23 1455   07/13/23 0500  CBC  Every Monday (0500),   R     Question:  Specimen collection method  Answer:  Lab=Lab collect   07/12/23 1218   07/13/23 0500  Comprehensive metabolic panel  Every Monday (1610),   R      07/12/23 1220           Total time spent in review of labs and imaging, patient evaluation, formulation of plan, documentation and communication with family: 45 minutes  Signed, Lorin Glass, MD [facility] 07/18/2023

## 2023-07-18 NOTE — Progress Notes (Addendum)
 Amoxicillin given PO to patient. No complaints of itching or any allergic reaction. Will continue to monitor.  6 am amoxicillin PO given, no c/o itching, rash or allergic reaction. Will continue to monitor.

## 2023-07-18 NOTE — Plan of Care (Signed)
  Problem: RH BOWEL ELIMINATION Goal: RH STG MANAGE BOWEL WITH ASSISTANCE Description: STG Manage Bowel with supervision Assistance. Outcome: Progressing   Problem: RH SKIN INTEGRITY Goal: RH STG SKIN FREE OF INFECTION/BREAKDOWN Description: Manage skin free of infection/ breakdown with supervision Assistance. Outcome: Progressing   Problem: Consults Goal: RH BRAIN INJURY PATIENT EDUCATION Description: Description: See Patient Education module for eduction specifics Outcome: Not Progressing Note: Patient is not able to retain information   Problem: RH BLADDER ELIMINATION Goal: RH STG MANAGE BLADDER WITH ASSISTANCE Description: STG Manage Bladder With supervision Assistance Outcome: Not Progressing Flowsheets (Taken 07/18/2023 1710) STG: Pt will manage bladder with assistance: 3-Moderate assistance Note: Still needs complete supervision, has confusion when sitting and knowing when she is actually sitting, Has urgency and the urge to pee 3-6 times a hour, calls out and doesn't remember going to the bathroom   Problem: RH BLADDER ELIMINATION Goal: RH STG MANAGE BLADDER WITH ASSISTANCE Description: STG Manage Bladder With supervision Assistance Outcome: Not Progressing Flowsheets (Taken 07/18/2023 1710) STG: Pt will manage bladder with assistance: 3-Moderate assistance Note: Still needs complete supervision, has confusion when sitting and knowing when she is actually sitting, Has urgency and the urge to pee 3-6 times a hour, calls out and doesn't remember going to the bathroom

## 2023-07-18 NOTE — Progress Notes (Addendum)
 Patient's  right PIV infiltrated this am. Removed PIV, clean and intact. Elevated right arm on 2 pillows and warm pack applied. Patent denies pain on site. Pharmacy notified. IV team consult order placed for PIV placement. Reported to day shift rn.

## 2023-07-18 NOTE — Progress Notes (Signed)
 PROGRESS NOTE   Subjective/Complaints:  Pt doing ok, endorses R sided HA but not seeming in much discomfort, hadn't mentioned it to LPN prior to eval. Thinks she didn't sleep well but isn't sure. Pain otherwise ok, she says. Unsure of LBM, looks like it was yesterday. Urinating fine, continent per nursing. No other complaints or concerns.    ROS: Denies fevers, chills, CP, SOB, diarrhea.   + Headache -severe, right-sided--not currently present + Lower abdominal pain/nausea--no longer present + Confusion, ongoing--improved + anxiety  Objective:   CT HEAD WO CONTRAST ( ) Result Date: 07/16/2023 CLINICAL DATA:  Headache, neuro deficit. Follow-up subdural hematoma. EXAM: CT HEAD WITHOUT CONTRAST TECHNIQUE: Contiguous axial images were obtained from the base of the skull through the vertex without intravenous contrast. RADIATION DOSE REDUCTION: This exam was performed according to the departmental dose-optimization program which includes automated exposure control, adjustment of the mA and/or kV according to patient size and/or use of iterative reconstruction technique. COMPARISON:  CT head without contrast 07/13/2023 and 07/04/2023. FINDINGS: Brain: The subdural hematoma over the right convexity has increased in size, measuring 13 mm maximally compared with 10.5 mm maximally on the prior study. The more hyperdense area is stable. Like scratched at the collection extends lateral to the temporal lobe more inferiorly. Partial effacement of the right lateral ventricle site scratched at slight mass effect on right lateral ventricle partial effacement the sulci has increased. Significant midline shift is present. Moderate generalized atrophy and white matter disease is otherwise stable. Remote infarct in the anterior left frontal lobe is stable. Deep brain nuclei are within normal limits. The brainstem and cerebellum are within normal limits. Midline  structures are within normal limits. Vascular: Atherosclerotic calcifications are present within the cavernous internal carotid arteries bilaterally. No hyperdense vessel is present. Skull: Calvarium is intact. No focal lytic or blastic lesions are present. No significant extracranial soft tissue lesion is present. Sinuses/Orbits: The paranasal sinuses and mastoid air cells are clear. Bilateral lens replacements are noted. Globes and orbits are otherwise unremarkable. IMPRESSION: 1. Interval increase in size of subdural hematoma over the right convexity, measuring 13 mm maximally compared with 10.5 mm maximally on the prior study. 2. Increased mass effect on the right lateral ventricle and partial effacement of the sulci. 3. No significant midline shift. 4. Stable moderate generalized atrophy and white matter disease. This likely reflects the sequela of chronic microvascular ischemia. 5. Stable remote infarct in the anterior left frontal lobe. Critical Value/emergent results were called by telephone at the time of interpretation on 07/16/2023 at 4:11 pm to provider [facility] , who verbally acknowledged these results. Electronically Signed   By: Marin Roberts M.D.   On: 07/16/2023 16:12    No results for input(s): "WBC", "HGB", "HCT", "PLT" in the last 72 hours.   Recent Labs    07/17/23 1546 07/18/23 0538  NA 128* 133*  K 3.4* 3.3*  CL 97* 104  CO2 25 23  GLUCOSE 114* 89  BUN 15 11  CREATININE 0.81 0.62  CALCIUM 9.5 9.9    Intake/Output Summary (Last 24 hours) at 07/18/2023 1229 Last data filed at 07/18/2023 0814 Gross per 24 hour  Intake 600.73  ml  Output --  Net 600.73 ml        Physical Exam: Vital Signs Blood pressure (!) 158/70, pulse 61, temperature 98.4 F (36.9 C), resp. rate 17, height 4\' 11"  (1.499 m), weight 43.5 kg, SpO2 98%.  General: No apparent distress.  Laying in bed. Looks comfortable Heart: Regular rate and rhythm ; mild systolic murmur Lungs: Clear to  auscultation, breathing unlabored, no rales or wheezes.  Abdomen: Positive bowel sounds, soft nontender to palpation, nondistended Extremities: mild edema in right upper extremity; otherwise no edema or deformity. IV in L wrist Skin: C/D/I. No apparent lesions.   Small scalp laceration-healing.  Staples removed. MSK:      No apparent deformity.  Full passive range of motion all 4 extremities.      PRIOR EXAMS: Neurologic exam:  Cognition: AAO to person, place, time and event without cueing. + Moderate memory deficits--ongoing + Mild left hemineglect--back to prior baseline today  Insight:  poor insight into current condition.  Mood: Moderately labile, intermittently pleasant and then tearful. Motor: Antigravity against resistance in all 4 extremities, mild left upper and lower extremity weakness 5-/5.  + Left upper extremity ataxia + Left upper extremity flexor synergy; no apparent tone or spasticity--better able to overcome 3-21 Sensory intact.    Assessment/Plan: 1. Functional deficits which require 3+ hours per day of interdisciplinary therapy in a comprehensive inpatient rehab setting. Physiatrist is providing close team supervision and 24 hour management of active medical problems listed below. Physiatrist and rehab team continue to assess barriers to discharge/monitor patient progress toward functional and medical goals  Care Tool:  Bathing    Body parts bathed by patient: Right arm, Left arm, Left upper leg, Right lower leg, Chest, Abdomen, Front perineal area, Buttocks, Right upper leg, Left lower leg, Face         Bathing assist Assist Level: Minimal Assistance - Patient > 75%     Upper Body Dressing/Undressing Upper body dressing   What is the patient wearing?: Bra, Pull over shirt    Upper body assist Assist Level: Minimal Assistance - Patient > 75%    Lower Body Dressing/Undressing Lower body dressing      What is the patient wearing?: Pants, Incontinence  brief     Lower body assist Assist for lower body dressing: Minimal Assistance - Patient > 75%     Toileting Toileting    Toileting assist Assist for toileting: Total Assistance - Patient < 25%     Transfers Chair/bed transfer  Transfers assist  Chair/bed transfer activity did not occur: Safety/medical concerns  Chair/bed transfer assist level: Minimal Assistance - Patient > 75%     Locomotion Ambulation   Ambulation assist      Assist level: Minimal Assistance - Patient > 75% Assistive device: Walker-rolling Max distance: 150'   Walk 10 feet activity   Assist     Assist level: Minimal Assistance - Patient > 75% Assistive device: Walker-rolling   Walk 50 feet activity   Assist    Assist level: Minimal Assistance - Patient > 75% Assistive device: Walker-rolling    Walk 150 feet activity   Assist    Assist level: Minimal Assistance - Patient > 75% Assistive device: Walker-rolling    Walk 10 feet on uneven surface  activity   Assist     Assist level: Minimal Assistance - Patient > 75% Assistive device: Walker-rolling   Wheelchair     Assist Is the patient using a wheelchair?: Yes Type of  Wheelchair: Chief of Staff assist level: Dependent - Patient 0% Max wheelchair distance: 150    Wheelchair 50 feet with 2 turns activity    Assist        Assist Level: Dependent - Patient 0%   Wheelchair 150 feet activity     Assist      Assist Level: Dependent - Patient 0%   Blood pressure (!) 158/70, pulse 61, temperature 98.4 F (36.9 C), resp. rate 17, height 4\' 11"  (1.499 m), weight 43.5 kg, SpO2 98%. Medical Problem List and Plan: 1. Functional deficits secondary to traumatic SDH with scalp laceration stapled in the ED as well as history Sjorgens syndrome/SLE             -patient may shower             -ELOS/Goals: 10-14 days S- DC held due to increased SDH and hyponatremia as below, tentatively 3-29              -Stable to continue CIR  3-19: Urine culture pending, hyponatremia worsened this a.m., nightly hypertension remains with transition from amlodipine to ACE inhibitor.  Per family request, we will extend length of stay through Saturday, with expectation that left-sided hemineglect and cognition may not fully improve in that time.  3-20: CT head with increasing SDH from 10.5 mm to 13 mm, with increased mass effect on the right lateral ventricle and partial sulci effacement.  Discussed with Dr. Maisie Fus, do not feel urgent intervention is warranted, recommended repeat CT head in 1 week to see if bleed has liquefied and possible burr hole evacuation at that time.  Monitor closely for status changes--clinically improved 3-21  2.  Antithrombotics: -DVT/anticoagulation:  Mechanical: Antiembolism stockings, thigh (TED hose) Bilateral lower extremities             -antiplatelet therapy: N/A  3. Pain Management: Tylenol as needed  -No complaints of pain, headaches. 3-14: Complaining of severe right-sided headaches, 10 out of 10, not responsive to Tylenol.  Has documented history of allergy to Topamax and with elevated LFTs, will not schedule max dose Tylenol.  Switched to Fioricet 1-2 tabs every 6 hours as needed. -07/12/23 HA this morning, but not given fioricet yet. Asked nurse to give this. No other reports afterwards from nursing; monitor 3-18: Headaches poorly controlled with Fioricet.  Have documented allergy to Topamax, however patient and family are not aware what this is, no description comments.  Will start Topamax 25 mg twice daily, with p.o. and IV Benadryl as needed in case of allergy reaction. 3-19: No reported headaches with Topamax.  Switch Fioricet to Tylenol.  with family concern for worsening cognition, will monitor overall attention with this and DC Topamax if necessary.  3-20: DC Topamax due to worsening confusion.--Has Tylenol as needed for headaches  4. Mood/Behavior/Sleep: Trazodone 75 mg  nightly.  Provide emotional support             -antipsychotic agents: Seroquel nightly as needed  -3-13: Add as needed melatonin. Add sleep log 3/14: Sleeping well overnight--did have documented use of Seroquel, no events documented overnight, spotted well.  Continue current medications. 3-18: Intermittently agitated overnight, per family get Xanax with dinner at baseline.  Will add back Xanax 0.25 mg as as needed for anxiety.  Continue sleep log. 3-19: Use Xanax and melatonin overnight; sleep improved, remains emotionally labile/anxious.  Agree with therapy assessment that this is likely to only improve when she is back in a home environment 3-20:  DC trazodone due to possible contributor to SIADH; continue with nightly melatonin and as needed Xanax (home medication)  5. Neuropsych/cognition: This patient is not capable of making decisions on her own behalf. -3-14: Subarachnoid hemorrhage 3-8, should be finishing 7 days of Keppra today.  Hopefully will improve once Keppra is off.   3-18: Per family, it is approximately at cognitive baseline; ongoing left hemineglect is worse 3-19: Hemineglect appears improved, however family concerned that still not back to where she was before rehab admission. - Continue TeleSitter for now 3-20: Worsening hemineglect today.  No other acute neurologic signs, vitally stable.  Will get repeat CT head given worsening headaches and deficits.  Treat hyponatremia as below. 3-21: CT head as above with worsening SDH, mass effect from 10 to 13 mm.  Discussed with Dr. Maisie Fus, neurosurgery.  Treating UTI and SIADH as below; seems to be improving her cognition.  6. Skin/Wound Care: Routine skin checks, should be ready for staple removal on 3/17   - 3/18: Order for staple removal placed  7. Fluids/Electrolytes/Nutrition: Routine in and outs with follow-up chemistries, continue vitamins and supplements  -07/12/23 BMP yesterday basically WNL except Ca 10.4; monitor routine  labs 3-17: Mild hyponatremia as below.  P.o. intakes are stable, albumin stable at 2.8. -Hyponatremia as below.  P.o. intakes do appear adequate, 75% of meals.  8.  Seizure prophylaxis.  Completed 7-day course of Keppra.   9.  History of CVA.  Plavix currently on hold due to SDH.   10.  Hyperlipidemia.  Continue Zocor 20mg  daily   11.  Hypertension.  Restart Norvasc 2.5mg  daily.    - monitor with therapies--tolerating well  -07/12/23 BPs good, monitor  3-17: Some intermittent hypertension overnight, discussed increase of Norvasc to 5 mg daily--family concerned that this this was a contributor to her fall, so we will switch from this to losartan 25 mg daily  3-19: More hypertensive overnight, normotensive this a.m.  Just started losartan today, so will monitor for effect.  May need to switch BP medications overnight.  3-20: BP improved today, but with worsening hyponatremia, will DC ACE inhibitor.  Monitor overnight and may resume amlodipine 2.5 mg in a.m.  3-21: Diastolic remains soft, continue IV fluids and holding amlodipine.   -07/18/23 BPs improving, monitor with current holding of meds Vitals:   07/14/23 0409 07/14/23 1411 07/14/23 2029 07/15/23 0235  BP: (!) 158/64 132/71 (!) 171/82 (!) 123/95   07/15/23 1422 07/15/23 2115 07/16/23 0340 07/16/23 2122  BP: (!) 145/85 (!) 106/54 139/77 (!) 139/59   07/17/23 0546 07/17/23 1310 07/17/23 1931 07/18/23 0531  BP: (!) 147/56 (!) 129/59 (!) 157/64 (!) 158/70     12.  Hyponatremia/SIADH.  continue sodium chloride tablets.  Follow-up chemistries - resolved on admission labs; reduce chloride tabs form TID to 1 g BID; recheck Saturday for further titration -07/12/23 labs yesterday with appropriate NaCl levels, continue dose for now . 3/ 17: Mild hyponatremia 133 this a.m.; placed 1200 cc fluid restriction and continue salt tabs.  Repeat in 2 to 3 days 3/19: NA now down to 130; increase salt tabs to 3 times daily 3-20: NA down to 127.  Increase  salt tabs to 2 tabs twice daily.  DC Topamax, trazodone, and losartan.  Urine sodium, osmolality ordered.  Chest x-ray negative.  Start IV fluids normal saline at 75 cc/h and repeat BMP every 12 hours 3-21: NA down to 124 overnight after 3 hours of IV fluid and increasing salt tabs; hospitalist  did, appreciate recommendations and care.  Urine studies back, consistent with SIADH.  Without further adjustment, sodium improved to 130 this a.m.  Repeat BMP this afternoon and daily over the weekend to ensure stability, hospitalist to monitor over the weekend -07/18/23 Na up to 133, monitor    Latest Ref Rng & Units 07/18/2023    5:38 AM 07/17/2023    3:46 PM 07/17/2023    9:37 AM  BMP  Glucose 70 - 99 mg/dL 89  604  91   BUN 8 - 23 mg/dL 11  15  11    Creatinine 0.44 - 1.00 mg/dL 5.40  9.81  1.91   Sodium 135 - 145 mmol/L 133  128  130   Potassium 3.5 - 5.1 mmol/L 3.3  3.4  3.7   Chloride 98 - 111 mmol/L 104  97  98   CO2 22 - 32 mmol/L 23  25  23    Calcium 8.9 - 10.3 mg/dL 9.9  9.5  47.8     13.  History of membranous glomerulonephritis.  Patient not on immunosuppression followed by Dr. Clelia Schaumann   - BUN and CR stable  14. Transaminitis.   - reduce PRN tylenol dose to 325 mg   - recheck Monday-- ordered  15. GERD: continue protonix 40mg  daily  16. Constipation: -07/12/23 no BM in 3-4 days, will increase colace to BID, monitor, if no BM by tomorrow, may need sorbitol or some other stimulant laxative.  3-17: No recorded bowel movement, add Senokot S1 tablet twice daily and give as needed MiraLAX today 07/18/23 LBM yesterday, monitor  17. UTI/urinary urgency/incontinence   - 3-18 urinalysis significant for infection; culture not ordered with urinalysis so added on today   - Start Keflex 500 mg twice daily for 5 days; follow cultures sensitivity  3/19: Continence seems to be improving somewhat, now mixed.  Cultures pending  3-20: Cultures NGTD.  DC Keflex.  3-21: Cultures reincubated and  yesterday afternoon grew Pseudomonas and Enterococcus.  Patient started on IV Merrem 500 mg every 12 per hospitalist, with good effect.  Would continue IV given history sulfa and penicillin allergies for at least 5 days.  Appreciate hospitalist input.  LOS: 10 days A FACE TO FACE EVALUATION WAS PERFORMED  [address] 07/18/2023, 12:29 PM

## 2023-07-18 NOTE — Progress Notes (Signed)
 Occupational Therapy TBI Note  Patient Details  Name: Peggy Ramirez MRN: [id number] Date of Birth: 1933-08-09  Today's Date: 07/18/2023 OT Individual Time: 0805-0900 OT Individual Time Calculation (min): 55 min    Short Term Goals: Week 2:  OT Short Term Goal 1 (Week 2): STG= LTG d/t ELOS  Skilled Therapeutic Interventions/Progress Updates:  Pt received toileting with nursing students, now on IV fluids/medications. Pt performs ambulatory toilet transfer x2 with CGA + RW (assistance for IV pole management). During second round of toileting, pt completes 3/3 activities with CGA for standing balance, pericare in sitting with cuing to terminate task. Standing at sink-side with close supervision, pt performs oral care with cuing for sequencing of task. Pt dresses UB/LB with light Min A and increased levels of encouragement for independence. Pt requires Max A to place hearing aid correctly.  Pt ambulates from room<>day room, cuing for navigating environment. Pt instructed in BLE coordination activity using colored/numbered visual targets on the floor. Pt tasked to tap visual target as dictated by therapist (I.e left foot, purple), targets placed in front semi-circle layout, pt requires downgraded from 3 to 2 visual targets, completing x2 rounds.   Pt remained resting in bed with eyes closed, bed alarm activated, and LPN updated on patient positioning.   Therapy Documentation Precautions:  Precautions Precautions: None Restrictions Weight Bearing Restrictions Per Provider Order: No  Agitated Behavior Scale: TBI Observation Details Observation Environment: CIR Start of observation period - Date: 07/18/23 Start of observation period - Time: 0805 End of observation period - Date: 07/18/23 End of observation period - Time: 0900 Agitated Behavior Scale (DO NOT LEAVE BLANKS) Short attention span, easy distractibility, inability to concentrate: Present to a slight degree Impulsive,  impatient, low tolerance for pain or frustration: Absent Uncooperative, resistant to care, demanding: Absent Violent and/or threatening violence toward people or property: Absent Explosive and/or unpredictable anger: Absent Rocking, rubbing, moaning, or other self-stimulating behavior: Absent Pulling at tubes, restraints, etc.: Absent Wandering from treatment areas: Absent Restlessness, pacing, excessive movement: Absent Repetitive behaviors, motor, and/or verbal: Present to a slight degree Rapid, loud, or excessive talking: Absent Sudden changes of mood: Absent Easily initiated or excessive crying and/or laughter: Absent Self-abusiveness, physical and/or verbal: Absent Agitated behavior scale total score: 16   Therapy/Group: Individual Therapy  Lou Cal, OTR/L, MSOT  07/18/2023, 12:36 PM

## 2023-07-19 LAB — BASIC METABOLIC PANEL
Anion gap: 8 (ref 5–15)
BUN: 10 mg/dL (ref 8–23)
CO2: 25 mmol/L (ref 22–32)
Calcium: 10.1 mg/dL (ref 8.9–10.3)
Chloride: 97 mmol/L — ABNORMAL LOW (ref 98–111)
Creatinine, Ser: 0.54 mg/dL (ref 0.44–1.00)
GFR, Estimated: >60 mL/min (ref >60)
Glucose, Bld: 102 mg/dL — ABNORMAL HIGH (ref 70–99)
Potassium: 3.5 mmol/L (ref 3.5–5.1)
Sodium: 130 mmol/L — ABNORMAL LOW (ref 135–145)

## 2023-07-19 MED ORDER — BUTALBITAL-APAP-CAFFEINE 50-325-40 MG PO TABS
1.0000 | ORAL_TABLET | Freq: Once | ORAL | Status: AC
  Administered 2023-07-19: 1 via ORAL
  Filled 2023-07-19: qty 1

## 2023-07-19 MED ORDER — MELATONIN 5 MG PO TABS
5.0000 mg | ORAL_TABLET | Freq: Every evening | ORAL | Status: DC | PRN
  Administered 2023-07-19 – 2023-07-24 (×4): 5 mg via ORAL
  Filled 2023-07-19 (×5): qty 1

## 2023-07-19 NOTE — Progress Notes (Signed)
 PROGRESS NOTE  Peggy Ramirez  DOB: 02-20-1934  PCP: Estanislado Pandy, MD ZOX:[id number]  DOA: 07/08/2023  LOS: 11 days  Hospital Day: 12  Brief narrative: Peggy Ramirez is a 89 y.o. female with PMH significant for traumatic subdural hematoma on seizure prophylaxis medications, lupus, Sjogren syndrome, membranous glomerulonephritis, history of CVA. 3/8-3/12, was hospitalized for posttraumatic intracranial hemorrhage secondary to a mechanical fall and discharged to CIR Since 3/12, she was gradually improving in rehab but her labs showed stated drop in sodium level.   3/20, her mental status also declined, raising suspicion of UTI and hence hospitalist service was consulted. Of note, repeat CT head 3/26 showed enlarging subdural hematoma with midline shift.  No intervention recommended by neurosurgery. See below for details  Subjective: Patient was seen and examined this morning. Somnolent, opens eyes to command.  Able to have a conversation.  Oriented to place and person. No family at bedside.  Assessment and plan: Acute metabolic encephalopathy Multifactorial etiology: Hyponatremia, enlarging subdural hematoma, UTI Patient was significantly altered yesterday 3/20.  Mental status gradually improving now.  Hyponatremia SIADH Serum osmolality 270, urine osmolality 276, urine sodium 60 -present suspicion of SIADH from brain bleed Sodium level gradually improving with salt tab. Currently continued on reduced dose of salt tab at 1 g daily.  Continue to monitor. Repeat labs tomorrow. Recent Labs  Lab 07/13/23 0537 07/15/23 0509 07/16/23 0604 07/16/23 1655 07/16/23 2204 07/17/23 0937 07/17/23 1546 07/18/23 0538  NA 133* 130* 127* 124* 125* 130* 128* 133*   UTI  Urine culture grew>100000 Enterococcus and Pseudomonas.   Was initially started on meropenem.   Currently on oral amoxicillin and IV cefepime Follow-up culture report   Subdural hematoma with midline  shift Follow-up CT head showed increasing size with increased midline shift.  Per rehab team, this was discussed with neurosurgeon.  Recommended no surgical intervention.  Hypokalemia Potassium level was low at 3.3 today.  Replacement given. Recent Labs  Lab 07/16/23 0604 07/16/23 1655 07/17/23 0937 07/17/23 1546 07/18/23 0538  K 3.9 3.6 3.7 3.4* 3.3*   Mobility: Encourage ambulation  Goals of care   Code Status: Full Code     DVT prophylaxis:  Place TED hose Start: 07/08/23 1925   Antimicrobials: Oral amoxicillin and IV cefepime Fluid: None Family Communication: None at bedside  Level of care:   Currently in CIR.  No need to transfer the hospital service. Will continue to follow.  If BMP stable tomorrow and urine culture/sensitivity finalizes, may be able to sign off in 1 to 2 days.    Diet:  Diet Order             Diet regular Fluid consistency: Thin; Fluid restriction: 1200 mL Fluid  Diet effective now                   Scheduled Meds:  amoxicillin  500 mg Oral Q8H   vitamin C  1,000 mg Oral Daily   Carboxymethylcellul-Glycerin  1 drop Ophthalmic QID   docusate sodium  100 mg Oral BID   multivitamin with minerals  1 tablet Oral Daily   senna-docusate  1 tablet Oral BID   simvastatin  20 mg Oral Daily   sodium chloride  1 g Oral Daily    PRN meds: acetaminophen, ALPRAZolam, diphenhydrAMINE **OR** diphenhydrAMINE, melatonin, polyethylene glycol, polyvinyl alcohol   Infusions:   ceFEPime (MAXIPIME) IV 1 g (07/18/23 2028)    Antimicrobials: Anti-infectives (From admission, onward)    Start  Dose/Rate Route Frequency Ordered Stop   07/17/23 2200  amoxicillin (AMOXIL) capsule 500 mg        500 mg Oral Every 8 hours 07/17/23 1626     07/17/23 2000  ceFEPIme (MAXIPIME) 1 g in sodium chloride 0.9 % 100 mL IVPB        1 g 200 mL/hr over 30 Minutes Intravenous Every 12 hours 07/17/23 1626     07/17/23 0045  meropenem (MERREM) 500 mg in sodium  chloride 0.9 % 100 mL IVPB  Status:  Discontinued        500 mg 200 mL/hr over 30 Minutes Intravenous Every 12 hours 07/16/23 2350 07/17/23 1626   07/16/23 0000  cephALEXin (KEFLEX) 500 MG capsule        500 mg Oral Every 12 hours 07/16/23 0518     07/14/23 2000  cephALEXin (KEFLEX) capsule 500 mg  Status:  Discontinued        500 mg Oral Every 12 hours 07/14/23 1344 07/16/23 0925       Objective: Vitals:   07/18/23 1956 07/19/23 0429  BP: (!) 143/71 (!) 157/55  Pulse: 72 63  Resp: 18 18  Temp: 99.9 F (37.7 C) 98.8 F (37.1 C)  SpO2: 99% 100%    Intake/Output Summary (Last 24 hours) at 07/19/2023 0950 Last data filed at 07/19/2023 0757 Gross per 24 hour  Intake 787 ml  Output --  Net 787 ml   Filed Weights   07/16/23 0500 07/18/23 0500 07/19/23 0500  Weight: 42.3 kg 43.5 kg 44.2 kg   Weight change: 0.7 kg Body mass index is 19.68 kg/m.   Physical Exam: General exam: Pleasant, elderly Caucasian female.  Not in pain Skin: No rashes, lesions or ulcers. HEENT: Atraumatic, normocephalic, no obvious bleeding Lungs: Clear to auscultation bilaterally,  CVS: S1, S2, no murmur,   GI/Abd: Soft, nontender, nondistended, bowel sound present,   CNS: Opens eyes on command.  Oriented to place, person.  Knows it is in the month of March Psychiatry: Mood appropriate,  Extremities: No pedal edema, no calf tenderness,   Data Review: I have personally reviewed the laboratory data and studies available.  F/u labs ordered Unresulted Labs (From admission, onward)     Start     Ordered   07/17/23 1456  Basic metabolic panel  Daily,   R      07/17/23 1455   07/13/23 0500  CBC  Every Monday (0500),   R     Question:  Specimen collection method  Answer:  Lab=Lab collect   07/12/23 1218   07/13/23 0500  Comprehensive metabolic panel  Every Monday (1610),   R      07/12/23 1220           Total time spent in review of labs and imaging, patient evaluation, formulation of plan,  documentation and communication with family: 45 minutes  Signed, Lorin Glass, MD [facility] 07/19/2023

## 2023-07-19 NOTE — Plan of Care (Signed)
  Problem: RH BOWEL ELIMINATION Goal: RH STG MANAGE BOWEL WITH ASSISTANCE Description: STG Manage Bowel with supervision Assistance. Outcome: Progressing   Problem: RH BLADDER ELIMINATION Goal: RH STG MANAGE BLADDER WITH ASSISTANCE Description: STG Manage Bladder With supervision Assistance Outcome: Progressing   Problem: RH SKIN INTEGRITY Goal: RH STG SKIN FREE OF INFECTION/BREAKDOWN Description: Manage skin free of infection/ breakdown with supervision Assistance. Outcome: Progressing

## 2023-07-19 NOTE — Plan of Care (Signed)
  Problem: RH BOWEL ELIMINATION Goal: RH STG MANAGE BOWEL WITH ASSISTANCE Description: STG Manage Bowel with supervision Assistance. Outcome: Progressing   Problem: RH BLADDER ELIMINATION Goal: RH STG MANAGE BLADDER WITH ASSISTANCE Description: STG Manage Bladder With supervision Assistance Outcome: Progressing   Problem: RH SKIN INTEGRITY Goal: RH STG SKIN FREE OF INFECTION/BREAKDOWN Description: Manage skin free of infection/ breakdown with supervision Assistance. Outcome: Progressing   Problem: Consults Goal: RH BRAIN INJURY PATIENT EDUCATION Description: Description: See Patient Education module for eduction specifics Outcome: Not Applicable Patient family applicable and progressing with education

## 2023-07-19 NOTE — Progress Notes (Signed)
 PROGRESS NOTE   Subjective/Complaints:  Pt doing fine, slept ok. Denies pain except for R head, but looks comfortable. Unsure of LBM, looks like it was yesterday per report. Urinating fine, continent per nursing. No other complaints or concerns.    ROS: Denies fevers, chills, CP, SOB, diarrhea.   + Headache -severe, right-sided--improving? + Lower abdominal pain/nausea--no longer present + Confusion, ongoing--improved + anxiety  Objective:   No results found.   No results for input(s): "WBC", "HGB", "HCT", "PLT" in the last 72 hours.   Recent Labs    07/18/23 0538 07/19/23 0920  NA 133* 130*  K 3.3* 3.5  CL 104 97*  CO2 23 25  GLUCOSE 89 102*  BUN 11 10  CREATININE 0.62 0.54  CALCIUM 9.9 10.1    Intake/Output Summary (Last 24 hours) at 07/19/2023 1114 Last data filed at 07/19/2023 0757 Gross per 24 hour  Intake 787 ml  Output --  Net 787 ml        Physical Exam: Vital Signs Blood pressure (!) 157/55, pulse 63, temperature 98.8 F (37.1 C), resp. rate 18, height 4\' 11"  (1.499 m), weight 44.2 kg, SpO2 100%.  General: No apparent distress.  Laying in bed. Looks comfortable Heart: Regular rate and rhythm ; mild systolic murmur Lungs: Clear to auscultation, breathing unlabored, no rales or wheezes.  Abdomen: Positive bowel sounds, soft nontender to palpation, nondistended Extremities: mild edema in right upper extremity; otherwise no edema or deformity. IV in L wrist Skin: C/D/I. No apparent lesions.   Small scalp laceration-healing.  Staples removed. Psych: a little anxious at times      PRIOR EXAMS: MSK:      No apparent deformity.  Full passive range of motion all 4 extremities. Neurologic exam:  Cognition: AAO to person, place, time and event without cueing. + Moderate memory deficits--ongoing + Mild left hemineglect--back to prior baseline today  Insight:  poor insight into current condition.   Mood: Moderately labile, intermittently pleasant and then tearful. Motor: Antigravity against resistance in all 4 extremities, mild left upper and lower extremity weakness 5-/5.  + Left upper extremity ataxia + Left upper extremity flexor synergy; no apparent tone or spasticity--better able to overcome 3-21 Sensory intact.    Assessment/Plan: 1. Functional deficits which require 3+ hours per day of interdisciplinary therapy in a comprehensive inpatient rehab setting. Physiatrist is providing close team supervision and 24 hour management of active medical problems listed below. Physiatrist and rehab team continue to assess barriers to discharge/monitor patient progress toward functional and medical goals  Care Tool:  Bathing    Body parts bathed by patient: Right arm, Left arm, Left upper leg, Right lower leg, Chest, Abdomen, Front perineal area, Buttocks, Right upper leg, Left lower leg, Face         Bathing assist Assist Level: Minimal Assistance - Patient > 75%     Upper Body Dressing/Undressing Upper body dressing   What is the patient wearing?: Bra, Pull over shirt    Upper body assist Assist Level: Minimal Assistance - Patient > 75%    Lower Body Dressing/Undressing Lower body dressing      What is the patient wearing?: Pants,  Incontinence brief     Lower body assist Assist for lower body dressing: Minimal Assistance - Patient > 75%     Toileting Toileting    Toileting assist Assist for toileting: Moderate Assistance - Patient 50 - 74%     Transfers Chair/bed transfer  Transfers assist  Chair/bed transfer activity did not occur: Safety/medical concerns  Chair/bed transfer assist level: Minimal Assistance - Patient > 75%     Locomotion Ambulation   Ambulation assist      Assist level: Minimal Assistance - Patient > 75% Assistive device: Walker-rolling Max distance: 150'   Walk 10 feet activity   Assist     Assist level: Minimal  Assistance - Patient > 75% Assistive device: Walker-rolling   Walk 50 feet activity   Assist    Assist level: Minimal Assistance - Patient > 75% Assistive device: Walker-rolling    Walk 150 feet activity   Assist    Assist level: Minimal Assistance - Patient > 75% Assistive device: Walker-rolling    Walk 10 feet on uneven surface  activity   Assist     Assist level: Minimal Assistance - Patient > 75% Assistive device: Development worker, international aid     Assist Is the patient using a wheelchair?: Yes Type of Wheelchair: Manual    Wheelchair assist level: Dependent - Patient 0% Max wheelchair distance: 150    Wheelchair 50 feet with 2 turns activity    Assist        Assist Level: Dependent - Patient 0%   Wheelchair 150 feet activity     Assist      Assist Level: Dependent - Patient 0%   Blood pressure (!) 157/55, pulse 63, temperature 98.8 F (37.1 C), resp. rate 18, height 4\' 11"  (1.499 m), weight 44.2 kg, SpO2 100%. Medical Problem List and Plan: 1. Functional deficits secondary to traumatic SDH with scalp laceration stapled in the ED as well as history Sjorgens syndrome/SLE             -patient may shower             -ELOS/Goals: 10-14 days S- DC held due to increased SDH and hyponatremia as below, tentatively 3-29             -Stable to continue CIR  3-19: Urine culture pending, hyponatremia worsened this a.m., nightly hypertension remains with transition from amlodipine to ACE inhibitor.  Per family request, we will extend length of stay through Saturday, with expectation that left-sided hemineglect and cognition may not fully improve in that time.  3-20: CT head with increasing SDH from 10.5 mm to 13 mm, with increased mass effect on the right lateral ventricle and partial sulci effacement.  Discussed with Dr. Maisie Fus, do not feel urgent intervention is warranted, recommended repeat CT head in 1 week to see if bleed has liquefied and possible  burr hole evacuation at that time.  Monitor closely for status changes--clinically improved 3/21-23  2.  Antithrombotics: -DVT/anticoagulation:  Mechanical: Antiembolism stockings, thigh (TED hose) Bilateral lower extremities             -antiplatelet therapy: N/A  3. Pain Management: Tylenol as needed  -No complaints of pain, headaches. 3-14: Complaining of severe right-sided headaches, 10 out of 10, not responsive to Tylenol.  Has documented history of allergy to Topamax and with elevated LFTs, will not schedule max dose Tylenol.  Switched to Fioricet 1-2 tabs every 6 hours as needed. -07/12/23 HA this morning, but not given fioricet  yet. Asked nurse to give this. No other reports afterwards from nursing; monitor 3-18: Headaches poorly controlled with Fioricet.  Have documented allergy to Topamax, however patient and family are not aware what this is, no description comments.  Will start Topamax 25 mg twice daily, with p.o. and IV Benadryl as needed in case of allergy reaction. 3-19: No reported headaches with Topamax.  Switch Fioricet to Tylenol.  with family concern for worsening cognition, will monitor overall attention with this and DC Topamax if necessary.  3-20: DC Topamax due to worsening confusion.--Has Tylenol as needed for headaches  4. Mood/Behavior/Sleep: Trazodone 75 mg nightly.  Provide emotional support             -antipsychotic agents: Seroquel nightly as needed  -3-13: Add as needed melatonin. Add sleep log 3/14: Sleeping well overnight--did have documented use of Seroquel, no events documented overnight, spotted well.  Continue current medications. 3-18: Intermittently agitated overnight, per family get Xanax with dinner at baseline.  Will add back Xanax 0.25 mg as as needed for anxiety.  Continue sleep log. 3-19: Use Xanax and melatonin overnight; sleep improved, remains emotionally labile/anxious.  Agree with therapy assessment that this is likely to only improve when she is  back in a home environment 3-20: DC trazodone due to possible contributor to SIADH; continue with nightly melatonin and as needed Xanax (home medication)  5. Neuropsych/cognition: This patient is not capable of making decisions on her own behalf. -3-14: Subarachnoid hemorrhage 3-8, should be finishing 7 days of Keppra today.  Hopefully will improve once Keppra is off.   3-18: Per family, it is approximately at cognitive baseline; ongoing left hemineglect is worse 3-19: Hemineglect appears improved, however family concerned that still not back to where she was before rehab admission. - Continue TeleSitter for now 3-20: Worsening hemineglect today.  No other acute neurologic signs, vitally stable.  Will get repeat CT head given worsening headaches and deficits.  Treat hyponatremia as below. 3-21: CT head as above with worsening SDH, mass effect from 10 to 13 mm.  Discussed with Dr. Maisie Fus, neurosurgery.  Treating UTI and SIADH as below; seems to be improving her cognition.  6. Skin/Wound Care: Routine skin checks, should be ready for staple removal on 3/17   - 3/18: Order for staple removal placed  7. Fluids/Electrolytes/Nutrition: Routine in and outs with follow-up chemistries, continue vitamins and supplements  -07/12/23 BMP yesterday basically WNL except Ca 10.4; monitor routine labs 3-17: Mild hyponatremia as below.  P.o. intakes are stable, albumin stable at 2.8. -Hyponatremia as below.  P.o. intakes do appear adequate, 75% of meals.  8.  Seizure prophylaxis.  Completed 7-day course of Keppra.   9.  History of CVA.  Plavix currently on hold due to SDH.   10.  Hyperlipidemia.  Continue Zocor 20mg  daily   11.  Hypertension.  Restart Norvasc 2.5mg  daily.    - monitor with therapies--tolerating well  -07/12/23 BPs good, monitor  3-17: Some intermittent hypertension overnight, discussed increase of Norvasc to 5 mg daily--family concerned that this this was a contributor to her fall, so we  will switch from this to losartan 25 mg daily  3-19: More hypertensive overnight, normotensive this a.m.  Just started losartan today, so will monitor for effect.  May need to switch BP medications overnight.  3-20: BP improved today, but with worsening hyponatremia, will DC ACE inhibitor.  Monitor overnight and may resume amlodipine 2.5 mg in a.m.  3-21: Diastolic remains soft, continue IV  fluids and holding amlodipine.   -07/18/23 BPs improving, monitor with current holding of meds -07/19/23 BPs a little up, but don't think we need to adjust meds further today Vitals:   07/15/23 1422 07/15/23 2115 07/16/23 0340 07/16/23 2122  BP: (!) 145/85 (!) 106/54 139/77 (!) 139/59   07/17/23 0546 07/17/23 1310 07/17/23 1931 07/18/23 0531  BP: (!) 147/56 (!) 129/59 (!) 157/64 (!) 158/70   07/18/23 1251 07/18/23 1345 07/18/23 1956 07/19/23 0429  BP: (!) 113/93 (!) 168/67 (!) 143/71 (!) 157/55     12.  Hyponatremia/SIADH.  continue sodium chloride tablets.  Follow-up chemistries - resolved on admission labs; reduce chloride tabs form TID to 1 g BID; recheck Saturday for further titration -07/12/23 labs yesterday with appropriate NaCl levels, continue dose for now . 3/ 17: Mild hyponatremia 133 this a.m.; placed 1200 cc fluid restriction and continue salt tabs.  Repeat in 2 to 3 days 3/19: NA now down to 130; increase salt tabs to 3 times daily 3-20: NA down to 127.  Increase salt tabs to 2 tabs twice daily.  DC Topamax, trazodone, and losartan.  Urine sodium, osmolality ordered.  Chest x-ray negative.  Start IV fluids normal saline at 75 cc/h and repeat BMP every 12 hours 3-21: NA down to 124 overnight after 3 hours of IV fluid and increasing salt tabs; hospitalist did, appreciate recommendations and care.  Urine studies back, consistent with SIADH.  Without further adjustment, sodium improved to 130 this a.m.  Repeat BMP this afternoon and daily over the weekend to ensure stability, hospitalist to monitor over  the weekend -07/19/23 Na 130 today, hospitalist following, appreciate their assistance, continue reduced dose salt tab 1g daily    Latest Ref Rng & Units 07/19/2023    9:20 AM 07/18/2023    5:38 AM 07/17/2023    3:46 PM  BMP  Glucose 70 - 99 mg/dL 098  89  119   BUN 8 - 23 mg/dL 10  11  15    Creatinine 0.44 - 1.00 mg/dL 1.47  8.29  5.62   Sodium 135 - 145 mmol/L 130  133  128   Potassium 3.5 - 5.1 mmol/L 3.5  3.3  3.4   Chloride 98 - 111 mmol/L 97  104  97   CO2 22 - 32 mmol/L 25  23  25    Calcium 8.9 - 10.3 mg/dL 13.0  9.9  9.5     13.  History of membranous glomerulonephritis.  Patient not on immunosuppression followed by Dr. Clelia Schaumann   - BUN and CR stable  14. Transaminitis.   - reduce PRN tylenol dose to 325 mg   - recheck Monday-- ordered  15. GERD: continue protonix 40mg  daily  16. Constipation: -07/12/23 no BM in 3-4 days, will increase colace to BID, monitor, if no BM by tomorrow, may need sorbitol or some other stimulant laxative.  3-17: No recorded bowel movement, add Senokot S1 tablet twice daily and give as needed MiraLAX today 07/19/23 LBM yesterday, monitor  17. UTI/urinary urgency/incontinence   - 3-18 urinalysis significant for infection; culture not ordered with urinalysis so added on today   - Start Keflex 500 mg twice daily for 5 days; follow cultures sensitivity  3/19: Continence seems to be improving somewhat, now mixed.  Cultures pending  3-20: Cultures NGTD.  DC Keflex.  3-21: Cultures reincubated and yesterday afternoon grew Pseudomonas and Enterococcus.  Patient started on IV Merrem 500 mg every 12 per hospitalist, with good effect.  Would continue IV given history sulfa and penicillin allergies for at least 5 days.  Appreciate hospitalist input.  -07/19/23 per hospitalist, currently on amox and IV cefepime, following cx  LOS: 11 days A FACE TO FACE EVALUATION WAS PERFORMED  [address] 07/19/2023, 11:14 AM

## 2023-07-20 ENCOUNTER — Other Ambulatory Visit (HOSPITAL_COMMUNITY): Payer: Self-pay

## 2023-07-20 LAB — COMPREHENSIVE METABOLIC PANEL
ALT: 46 U/L — ABNORMAL HIGH (ref 0–44)
AST: 30 U/L (ref 15–41)
Albumin: 2.9 g/dL — ABNORMAL LOW (ref 3.5–5.0)
Alkaline Phosphatase: 69 U/L (ref 38–126)
Anion gap: 9 (ref 5–15)
BUN: 8 mg/dL (ref 8–23)
CO2: 25 mmol/L (ref 22–32)
Calcium: 10 mg/dL (ref 8.9–10.3)
Chloride: 98 mmol/L (ref 98–111)
Creatinine, Ser: 0.52 mg/dL (ref 0.44–1.00)
GFR, Estimated: >60 mL/min (ref >60)
Glucose, Bld: 90 mg/dL (ref 70–99)
Potassium: 3.4 mmol/L — ABNORMAL LOW (ref 3.5–5.1)
Sodium: 132 mmol/L — ABNORMAL LOW (ref 135–145)
Total Bilirubin: 0.8 mg/dL (ref 0.0–1.2)
Total Protein: 6.4 g/dL — ABNORMAL LOW (ref 6.5–8.1)

## 2023-07-20 LAB — CBC
HCT: 36.5 % (ref 36.0–46.0)
Hemoglobin: 12.7 g/dL (ref 12.0–15.0)
MCH: 31.3 pg (ref 26.0–34.0)
MCHC: 34.8 g/dL (ref 30.0–36.0)
MCV: 89.9 fL (ref 80.0–100.0)
Platelets: 406 10*3/uL — ABNORMAL HIGH (ref 150–400)
RBC: 4.06 MIL/uL (ref 3.87–5.11)
RDW: 12.2 % (ref 11.5–15.5)
WBC: 6.8 10*3/uL (ref 4.0–10.5)
nRBC: 0 % (ref 0.0–0.2)

## 2023-07-20 MED ORDER — CIPROFLOXACIN HCL 250 MG PO TABS
250.0000 mg | ORAL_TABLET | Freq: Two times a day (BID) | ORAL | Status: AC
  Administered 2023-07-20 – 2023-07-24 (×8): 250 mg via ORAL
  Filled 2023-07-20 (×8): qty 1

## 2023-07-20 MED ORDER — AMLODIPINE BESYLATE 2.5 MG PO TABS
2.5000 mg | ORAL_TABLET | Freq: Every day | ORAL | Status: DC
  Administered 2023-07-21 – 2023-07-25 (×5): 2.5 mg via ORAL
  Filled 2023-07-20 (×5): qty 1

## 2023-07-20 MED ORDER — SACCHAROMYCES BOULARDII 250 MG PO CAPS
250.0000 mg | ORAL_CAPSULE | Freq: Two times a day (BID) | ORAL | Status: DC
  Administered 2023-07-20 – 2023-07-25 (×10): 250 mg via ORAL
  Filled 2023-07-20 (×10): qty 1

## 2023-07-20 MED ORDER — POTASSIUM CHLORIDE 20 MEQ PO PACK
40.0000 meq | PACK | Freq: Once | ORAL | Status: AC
  Administered 2023-07-20: 40 meq via ORAL
  Filled 2023-07-20: qty 2

## 2023-07-20 NOTE — Progress Notes (Signed)
 Patient ID: Peggy Ramirez, female   DOB: Sep 19, 1933, 89 y.o.   MRN: [id number]  Referral declined by calvin/[facility] and Lynette/[facility].   SW sent referral to Aaron[facility] and waiting on follow-up.  *referral accepted.   *Only current acceptance if Angie/[facility] and they do not have speech at this time.   Declined HHAs Calvin/[facility] Cory/[facility] Lynette/[facility] Cheryl/[facility]- no SLP

## 2023-07-20 NOTE — Progress Notes (Signed)
 Occupational Therapy Session Note  Patient Details  Name: Peggy Ramirez MRN: [id number] Date of Birth: 1933/10/23  Today's Date: 07/20/2023 OT Individual Time: 1478-2956 OT Individual Time Calculation (min): 70 min   Short Term Goals: Week 2:  OT Short Term Goal 1 (Week 2): STG= LTG d/t ELOS  Skilled Therapeutic Interventions/Progress Updates:  Pt received sitting in [facility] with son present. Pt with un-rated headache, low stimulating environment provided to manage pain. Pt dependently transported to all therapy locations. In ortho gym, pt completes x2 reps of un-standardized 9-hole peg test targeting FMC/attention to L-hand. Pt requires Max multimodal cuing to complete, including HOH for effective use of hand. In ADL apartment, pt instructed in x2 standing IADL activities, targeting standing balance/tolerance and attention to familiar task. Pt able to wash x5 "fruits" and fold 2x5 washcloths, all with close supervision + no UE support (stomach observed to be resting on sink-edge). Pt requires Mod-Max verbal cuing to integrate LUE into the above tasks, continued distractibility/perseveration with receiving medications and donning hearing aids (despite both already done). Pt performs WC>EOB with CGA + no AD, supervision/cuing for return to supine. Pt remained resting in bed, bed alarm activated, all immediate needs met.  Therapy Documentation Precautions:  Precautions Precautions: None Restrictions Weight Bearing Restrictions Per Provider Order: No   Therapy/Group: Individual Therapy  Lou Cal, OTR/L, MSOT  07/20/2023, 6:09 AM

## 2023-07-20 NOTE — Progress Notes (Signed)
 Physical Therapy Session Note  Patient Details  Name: Peggy Ramirez MRN: [id number] Date of Birth: 1934-03-20  Today's Date: 07/20/2023 PT Missed Time: 30 Minutes Missed Time Reason: Patient fatigue  Short Term Goals: Week 2:  PT Short Term Goal 1 (Week 2): STGs = LTGs  Skilled Therapeutic Interventions/Progress Updates:     Pt asleep upon PT arrival. PT will follow up as able.   Therapy Documentation Precautions:  Precautions Precautions: None Restrictions Weight Bearing Restrictions Per Provider Order: No General: PT Amount of Missed Time (min): 30 Minutes PT Missed Treatment Reason: Patient fatigue   Therapy/Group: Individual Therapy  Beau Fanny 07/20/2023, 4:17 PM

## 2023-07-20 NOTE — Progress Notes (Signed)
 PROGRESS NOTE  Peggy Ramirez  DOB: [birth year]  PCP: Estanislado Pandy, MD WUJ:[id number]  DOA: 07/08/2023  LOS: 12 days  Hospital Day: 13  Brief narrative: Peggy Ramirez is a 89 y.o. female with PMH significant for traumatic subdural hematoma on seizure prophylaxis medications, lupus, Sjogren syndrome, membranous glomerulonephritis, history of CVA. 3/8-3/12, was hospitalized for posttraumatic intracranial hemorrhage secondary to a mechanical fall and discharged to CIR Since 3/12, she was gradually improving in rehab but her labs showed stated drop in sodium level.   3/20, her mental status also declined, raising suspicion of UTI and hence hospitalist service was consulted. Of note, repeat CT head 3/26 showed enlarging subdural hematoma with midline shift.  No intervention recommended by neurosurgery. See below for details  Subjective: Patient was seen and examined this morning. Sitting up in chair.  Taking breakfast.  Take helping.  Able to talk, answer some yes/no questions.  Oriented to place and person.  Assessment and plan: Acute metabolic encephalopathy Multifactorial etiology: Hyponatremia, enlarging subdural hematoma, UTI Mental status gradually improving. 3/23, I switched from scheduled melatonin to as needed melatonin.  Hyponatremia SIADH Serum osmolality 270, urine osmolality 276, urine sodium 60 -present suspicion of SIADH from brain bleed Sodium level gradually improving with salt tab. Currently continued on reduced dose of salt tab at 1 g daily.  Continue to monitor. Labs stable.  If continues to remain stable, we will sign off tomorrow. Recent Labs  Lab 07/15/23 0509 07/16/23 0604 07/16/23 1655 07/16/23 2204 07/17/23 0937 07/17/23 1546 07/18/23 0538 07/19/23 0920 07/20/23 0606  NA 130* 127* 124* 125* 130* 128* 133* 130* 132*   UTI  Urine culture grew>100000 Enterococcus and Pseudomonas.   Was initially started on meropenem.   Currently on oral  amoxicillin and IV cefepime.  Discussed with pharmacy.  Will give 5 more days of oral Cipro and oral amoxicillin with probiotics.  Subdural hematoma with midline shift Follow-up CT head showed increasing size with increased midline shift.  Per rehab team, this was discussed with neurosurgeon.  Recommended no surgical intervention.  Hypokalemia Potassium level low at 3.4 today.  Oral replacement given. Recent Labs  Lab 07/17/23 [phone] 07/17/23 1546 07/18/23 0538 07/19/23 0920 07/20/23 0606  K 3.7 3.4* 3.3* 3.5 3.4*   Mobility: Encourage ambulation  Goals of care   Code Status: Full Code     DVT prophylaxis:  Place TED hose Start: 07/08/23 1925   Antimicrobials: Oral amoxicillin and IV cefepime Fluid: None Family Communication: None at bedside  Level of care:   Currently in CIR.  No need to transfer the hospital service. Will continue to follow.  If BMP stable tomorrow and urine culture/sensitivity finalizes, may be able to sign off in 1 to 2 days.    Diet:  Diet Order             Diet regular Fluid consistency: Thin; Fluid restriction: 1200 mL Fluid  Diet effective now                   Scheduled Meds:  amoxicillin  500 mg Oral Q8H   vitamin C  1,000 mg Oral Daily   Carboxymethylcellul-Glycerin  1 drop Ophthalmic QID   ciprofloxacin  250 mg Oral BID   docusate sodium  100 mg Oral BID   multivitamin with minerals  1 tablet Oral Daily   saccharomyces boulardii  250 mg Oral BID   senna-docusate  1 tablet Oral BID   simvastatin  20 mg Oral  Daily   sodium chloride  1 g Oral Daily    PRN meds: acetaminophen, ALPRAZolam, diphenhydrAMINE **OR** diphenhydrAMINE, melatonin, polyethylene glycol, polyvinyl alcohol   Infusions:     Antimicrobials: Anti-infectives (From admission, onward)    Start     Dose/Rate Route Frequency Ordered Stop   07/20/23 2000  ciprofloxacin (CIPRO) tablet 250 mg        250 mg Oral 2 times daily 07/20/23 1141 07/24/23 1959    07/17/23 2200  amoxicillin (AMOXIL) capsule 500 mg        500 mg Oral Every 8 hours 07/17/23 1626 07/24/23 2159   07/17/23 2000  ceFEPIme (MAXIPIME) 1 g in sodium chloride 0.9 % 100 mL IVPB  Status:  Discontinued        1 g 200 mL/hr over 30 Minutes Intravenous Every 12 hours 07/17/23 1626 07/20/23 1140   07/17/23 0045  meropenem (MERREM) 500 mg in sodium chloride 0.9 % 100 mL IVPB  Status:  Discontinued        500 mg 200 mL/hr over 30 Minutes Intravenous Every 12 hours 07/16/23 2350 07/17/23 1626   07/16/23 0000  cephALEXin (KEFLEX) 500 MG capsule        500 mg Oral Every 12 hours 07/16/23 0518     07/14/23 2000  cephALEXin (KEFLEX) capsule 500 mg  Status:  Discontinued        500 mg Oral Every 12 hours 07/14/23 1344 07/16/23 0925       Objective: Vitals:   07/20/23 0514 07/20/23 1300  BP: (!) 143/51 (!) 140/65  Pulse: (!) 52 60  Resp: 17 18  Temp: 99 F (37.2 C) 98.7 F (37.1 C)  SpO2: 100% 100%    Intake/Output Summary (Last 24 hours) at 07/20/2023 1448 Last data filed at 07/20/2023 1300 Gross per 24 hour  Intake 717 ml  Output 120 ml  Net 597 ml   Filed Weights   07/19/23 0500 07/19/23 1426 07/20/23 0500  Weight: 44.2 kg 44.2 kg 40.7 kg   Weight change: 0 kg Body mass index is 18.12 kg/m.   Physical Exam: General exam: Pleasant, elderly Caucasian female.  Not in pain. Skin: No rashes, lesions or ulcers. HEENT: Atraumatic, normocephalic, no obvious bleeding Lungs: Clear to auscultation bilaterally,  CVS: S1, S2, no murmur,   GI/Abd: Soft, nontender, nondistended, bowel sound present,   CNS: Alert, awake, oriented to place and person. Psychiatry: Mood appropriate,  Extremities: No pedal edema, no calf tenderness,   Data Review: I have personally reviewed the laboratory data and studies available.  F/u labs ordered Unresulted Labs (From admission, onward)     Start     Ordered   07/21/23 0500  Basic metabolic panel  Tomorrow morning,   R        07/20/23  1044   07/13/23 0500  CBC  Every Monday (0500),   R     Question:  Specimen collection method  Answer:  Lab=Lab collect   07/12/23 1218   07/13/23 0500  Comprehensive metabolic panel  Every Monday (4782),   R      07/12/23 1220           Total time spent in review of labs and imaging, patient evaluation, formulation of plan, documentation and communication with family: 45 minutes  Signed, Lorin Glass, MD [facility] 07/20/2023

## 2023-07-20 NOTE — Progress Notes (Signed)
 Physical Therapy Session Note  Patient Details  Name: Peggy Ramirez MRN: [id number] Date of Birth: 1933/10/26  Today's Date: 07/20/2023 PT Individual Time: 1002-1045 PT Individual Time Calculation (min): 43 min   Short Term Goals: Week 2:  PT Short Term Goal 1 (Week 2): STGs = LTGs  Skilled Therapeutic Interventions/Progress Updates:     Pt received seated in WC and agrees to therapy. No complaint of pain. Pt ambulates to toilet with minA and cues for positioning and safe sequencing. Pt stands from toilet multiple times and requires cueing for safety and to ensure pt remains seated while voiding bladder. Pt ambulates to sink with minA and cues for navigation and stability. Following, pt ambulates multiple bouts for endurance training and balance and gait training. Pt ambulates with CGA/minA at trunk for stability, with consistent cueing for upright gaze to improve posture and balance, and increasing stride length and gait speed to decrease risk for falls. Pt ambulates x100', x150', x350', and x150' with seated rest breaks. Pt left seated in WC with alarm intact and all needs within reach.   Therapy Documentation Precautions:  Precautions Precautions: None Restrictions Weight Bearing Restrictions Per Provider Order: No   Therapy/Group: Individual Therapy  Beau Fanny, PT, DPT 07/20/2023, 3:42 PM

## 2023-07-20 NOTE — Progress Notes (Signed)
 PROGRESS NOTE   Subjective/Complaints:  No events overnight.  Patient mildly anxious this a.m., states she has not gotten her medications yet and has a headache.  Otherwise, no complaints. AM NA stable 132; K 3.4; other labs stable BP 50s overngiht, mild HTN SBP 150-140s. Diastolic remains low. Continent b/b, LBM 3/24   ROS: Denies fevers, chills, CP, SOB, diarrhea.   + Headache -severe, right-sided--intermittent, ongoing + Confusion--improved + anxiety  Objective:   No results found.   Recent Labs    07/20/23 0606  WBC 6.8  HGB 12.7  HCT 36.5  PLT 406*     Recent Labs    07/19/23 0920 07/20/23 0606  NA 130* 132*  K 3.5 3.4*  CL 97* 98  CO2 25 25  GLUCOSE 102* 90  BUN 10 8  CREATININE 0.54 0.52  CALCIUM 10.1 10.0    Intake/Output Summary (Last 24 hours) at 07/20/2023 0757 Last data filed at 07/19/2023 1700 Gross per 24 hour  Intake 118 ml  Output 120 ml  Net -2 ml        Physical Exam: Vital Signs Blood pressure (!) 143/51, pulse (!) 52, temperature 99 F (37.2 C), resp. rate 17, height 4\' 11"  (1.499 m), weight 40.7 kg, SpO2 100%.  General: No apparent distress.  Sitting up at bedside. Heart: Regular rate and rhythm ; mild systolic murmur Lungs: Clear to auscultation, breathing unlabored, no rales or wheezes.  Abdomen: Positive bowel sounds, soft nontender to palpation, nondistended Extremities: mild edema in right upper extremity; otherwise no edema or deformity. IV in L wrist Skin: C/D/I. No apparent lesions.   Small scalp laceration-healing.  Staples removed. Psych: a little anxious at times   MSK:      No apparent deformity.  Full passive range of motion all 4 extremities. Neurologic exam:  Cognition: AAO to person, place and day with cues, month and year without cues + Moderate memory deficits--ongoing + Mild left hemineglect--ongoing, waxes and wanes with cognition  Insight:  poor  insight into current condition.  Mood: Anxious Motor: Antigravity against resistance in all 4 extremities, mild left upper and lower extremity weakness 5-/5.  + Left upper extremity ataxia + Left upper extremity flexor synergy; no apparent tone or spasticity-ongoing   Assessment/Plan: 1. Functional deficits which require 3+ hours per day of interdisciplinary therapy in a comprehensive inpatient rehab setting. Physiatrist is providing close team supervision and 24 hour management of active medical problems listed below. Physiatrist and rehab team continue to assess barriers to discharge/monitor patient progress toward functional and medical goals  Care Tool:  Bathing    Body parts bathed by patient: Right arm, Left arm, Left upper leg, Right lower leg, Chest, Abdomen, Front perineal area, Buttocks, Right upper leg, Left lower leg, Face         Bathing assist Assist Level: Minimal Assistance - Patient > 75%     Upper Body Dressing/Undressing Upper body dressing   What is the patient wearing?: Bra, Pull over shirt    Upper body assist Assist Level: Minimal Assistance - Patient > 75%    Lower Body Dressing/Undressing Lower body dressing      What is the  patient wearing?: Pants, Incontinence brief     Lower body assist Assist for lower body dressing: Minimal Assistance - Patient > 75%     Toileting Toileting    Toileting assist Assist for toileting: Moderate Assistance - Patient 50 - 74%     Transfers Chair/bed transfer  Transfers assist  Chair/bed transfer activity did not occur: Safety/medical concerns  Chair/bed transfer assist level: Minimal Assistance - Patient > 75%     Locomotion Ambulation   Ambulation assist      Assist level: Minimal Assistance - Patient > 75% Assistive device: Walker-rolling Max distance: 150'   Walk 10 feet activity   Assist     Assist level: Minimal Assistance - Patient > 75% Assistive device: Walker-rolling   Walk  50 feet activity   Assist    Assist level: Minimal Assistance - Patient > 75% Assistive device: Walker-rolling    Walk 150 feet activity   Assist    Assist level: Minimal Assistance - Patient > 75% Assistive device: Walker-rolling    Walk 10 feet on uneven surface  activity   Assist     Assist level: Minimal Assistance - Patient > 75% Assistive device: Development worker, international aid     Assist Is the patient using a wheelchair?: Yes Type of Wheelchair: Manual    Wheelchair assist level: Dependent - Patient 0% Max wheelchair distance: 150    Wheelchair 50 feet with 2 turns activity    Assist        Assist Level: Dependent - Patient 0%   Wheelchair 150 feet activity     Assist      Assist Level: Dependent - Patient 0%   Blood pressure (!) 143/51, pulse (!) 52, temperature 99 F (37.2 C), resp. rate 17, height 4\' 11"  (1.499 m), weight 40.7 kg, SpO2 100%. Medical Problem List and Plan: 1. Functional deficits secondary to traumatic SDH with scalp laceration stapled in the ED as well as history Sjorgens syndrome/SLE             -patient may shower             -ELOS/Goals: 10-14 days S- DC held due to increased SDH and hyponatremia as below, tentatively 3-29             -Stable to continue CIR  3-19: Urine culture pending, hyponatremia worsened this a.m., nightly hypertension remains with transition from amlodipine to ACE inhibitor.  Per family request, we will extend length of stay through Saturday, with expectation that left-sided hemineglect and cognition may not fully improve in that time.  3-20: CT head with increasing SDH from 10.5 mm to 13 mm, with increased mass effect on the right lateral ventricle and partial sulci effacement.  Discussed with Dr. Maisie Fus, do not feel urgent intervention is warranted, recommended repeat CT head in 1 week to see if bleed has liquefied and possible burr hole evacuation at that time.  Monitor closely for status  changes--clinically improved 3/21-23  2.  Antithrombotics: -DVT/anticoagulation:  Mechanical: Antiembolism stockings, thigh (TED hose) Bilateral lower extremities             -antiplatelet therapy: N/A  3. Pain Management: Tylenol as needed  -No complaints of pain, headaches. 3-14: Complaining of severe right-sided headaches, 10 out of 10, not responsive to Tylenol.  Has documented history of allergy to Topamax and with elevated LFTs, will not schedule max dose Tylenol.  Switched to Fioricet 1-2 tabs every 6 hours as needed. -07/12/23 HA this morning,  but not given fioricet yet. Asked nurse to give this. No other reports afterwards from nursing; monitor 3-18: Headaches poorly controlled with Fioricet.  Have documented allergy to Topamax, however patient and family are not aware what this is, no description comments.  Will start Topamax 25 mg twice daily, with p.o. and IV Benadryl as needed in case of allergy reaction. 3-19: No reported headaches with Topamax.  Switch Fioricet to Tylenol.  with family concern for worsening cognition, will monitor overall attention with this and DC Topamax if necessary.  3-20: DC Topamax due to worsening confusion.--Has Tylenol as needed for headaches  3-24: Intermittent Fioricet over the weekend for headaches, with benefit.  Likely to further confused due to barbital, continue Tylenol  4. Mood/Behavior/Sleep: Trazodone 75 mg nightly.  Provide emotional support             -antipsychotic agents: Seroquel nightly as needed  -3-13: Add as needed melatonin. Add sleep log 3/14: Sleeping well overnight--did have documented use of Seroquel, no events documented overnight, spotted well.  Continue current medications. 3-18: Intermittently agitated overnight, per family get Xanax with dinner at baseline.  Will add back Xanax 0.25 mg as as needed for anxiety.  Continue sleep log. 3-19: Use Xanax and melatonin overnight; sleep improved, remains emotionally labile/anxious.   Agree with therapy assessment that this is likely to only improve when she is back in a home environment 3-20: DC trazodone due to possible contributor to SIADH; continue with nightly melatonin and as needed Xanax (home medication) 3-24: Slept approximately 4 hours per log.  Melatonin taken from scheduled to as needed yesterday.  Monitor 1 day, if no further sleep would schedule again.  5. Neuropsych/cognition: This patient is not capable of making decisions on her own behalf. -3-14: Subarachnoid hemorrhage 3-8, should be finishing 7 days of Keppra today.  Hopefully will improve once Keppra is off.   3-18: Per family, it is approximately at cognitive baseline; ongoing left hemineglect is worse 3-19: Hemineglect appears improved, however family concerned that still not back to where she was before rehab admission. - Continue TeleSitter for now 3-20: Worsening hemineglect today.  No other acute neurologic signs, vitally stable.  Will get repeat CT head given worsening headaches and deficits.  Treat hyponatremia as below. 3-21: CT head as above with worsening SDH, mass effect from 10 to 13 mm.  Discussed with Dr. Maisie Fus, neurosurgery.  Treating UTI and SIADH as below; seems to be improving her cognition. 3-24: Will reach out to Dr. Maisie Fus this week about when to repeat CT head  6. Skin/Wound Care: Routine skin checks, should be ready for staple removal on 3/17   - 3/18: Order for staple removal placed  7. Fluids/Electrolytes/Nutrition: Routine in and outs with follow-up chemistries, continue vitamins and supplements  -07/12/23 BMP yesterday basically WNL except Ca 10.4; monitor routine labs 3-17: Mild hyponatremia as below.  P.o. intakes are stable, albumin stable at 2.8. -Hyponatremia as below.  P.o. intakes do appear adequate, 75% of meals. 3/24: K 40 meq today for repletion  8.  Seizure prophylaxis.  Completed 7-day course of Keppra.   9.  History of CVA.  Plavix currently on hold due to  SDH.   10.  Hyperlipidemia.  Continue Zocor 20mg  daily   11.  Hypertension.  Restart Norvasc 2.5mg  daily.    - monitor with therapies--tolerating well  -07/12/23 BPs good, monitor  3-17: Some intermittent hypertension overnight, discussed increase of Norvasc to 5 mg daily--family concerned that this this  was a contributor to her fall, so we will switch from this to losartan 25 mg daily  3-19: More hypertensive overnight, normotensive this a.m.  Just started losartan today, so will monitor for effect.  May need to switch BP medications overnight.  3-20: BP improved today, but with worsening hyponatremia, will DC ACE inhibitor.  Monitor overnight and may resume amlodipine 2.5 mg in a.m.  3-21: Diastolic remains soft, continue IV fluids and holding amlodipine.   -07/18/23 BPs improving, monitor with current holding of meds -07/19/23 BPs a little up, but don't think we need to adjust meds further today 3-24: BPs remain a little high, resume amlodipine 2.5 mg daily Vitals:   07/16/23 2122 07/17/23 0546 07/17/23 1310 07/17/23 1931  BP: (!) 139/59 (!) 147/56 (!) 129/59 (!) 157/64   07/18/23 0531 07/18/23 1251 07/18/23 1345 07/18/23 1956  BP: (!) 158/70 (!) 113/93 (!) 168/67 (!) 143/71   07/19/23 0429 07/19/23 1430 07/19/23 2051 07/20/23 0514  BP: (!) 157/55 (!) 168/82 (!) 163/75 (!) 143/51     12.  Hyponatremia/SIADH.  continue sodium chloride tablets.  Follow-up chemistries - resolved on admission labs; reduce chloride tabs form TID to 1 g BID; recheck Saturday for further titration -07/12/23 labs yesterday with appropriate NaCl levels, continue dose for now . 3/ 17: Mild hyponatremia 133 this a.m.; placed 1200 cc fluid restriction and continue salt tabs.  Repeat in 2 to 3 days 3/19: NA now down to 130; increase salt tabs to 3 times daily 3-20: NA down to 127.  Increase salt tabs to 2 tabs twice daily.  DC Topamax, trazodone, and losartan.  Urine sodium, osmolality ordered.  Chest x-ray negative.   Start IV fluids normal saline at 75 cc/h and repeat BMP every 12 hours 3-21: NA down to 124 overnight after 3 hours of IV fluid and increasing salt tabs; hospitalist did, appreciate recommendations and care.  Urine studies back, consistent with SIADH.  Without further adjustment, sodium improved to 130 this a.m.  Repeat BMP this afternoon and daily over the weekend to ensure stability, hospitalist to monitor over the weekend -07/19/23 Na 130 today, hospitalist following, appreciate their assistance, continue reduced dose salt tab 1g daily 3/24: Na 132 on NA 1 G PO daily only and fluid restriction; appreciate hospitalist recs     Latest Ref Rng & Units 07/20/2023    6:06 AM 07/19/2023    9:20 AM 07/18/2023    5:38 AM  BMP  Glucose 70 - 99 mg/dL 90  440  89   BUN 8 - 23 mg/dL 8  10  11    Creatinine 0.44 - 1.00 mg/dL 3.47  4.25  9.56   Sodium 135 - 145 mmol/L 132  130  133   Potassium 3.5 - 5.1 mmol/L 3.4  3.5  3.3   Chloride 98 - 111 mmol/L 98  97  104   CO2 22 - 32 mmol/L 25  25  23    Calcium 8.9 - 10.3 mg/dL 38.7  56.4  9.9     13.  History of membranous glomerulonephritis.  Patient not on immunosuppression followed by Dr. Clelia Schaumann   - BUN and CR stable  14. Transaminitis.   - reduce PRN tylenol dose to 325 mg   - recheck Monday-- ordered  15. GERD: continue protonix 40mg  daily  16. Constipation: -07/12/23 no BM in 3-4 days, will increase colace to BID, monitor, if no BM by tomorrow, may need sorbitol or some other stimulant laxative.  3-17:  No recorded bowel movement, add Senokot S1 tablet twice daily and give as needed MiraLAX today 07/19/23 LBM yesterday, monitor  17. UTI/urinary urgency/incontinence   - 3-18 urinalysis significant for infection; culture not ordered with urinalysis so added on today   - Start Keflex 500 mg twice daily for 5 days; follow cultures sensitivity  3/19: Continence seems to be improving somewhat, now mixed.  Cultures pending  3-20: Cultures NGTD.  DC  Keflex.  3-21: Cultures reincubated and yesterday afternoon grew Pseudomonas and Enterococcus.     -07/19/23 per hospitalist, currently on amox and IV cefepime, following cx  3-24: Continence improving with treatment.  LOS: 12 days A FACE TO FACE EVALUATION WAS PERFORMED  Angelina Sheriff 07/20/2023, 7:57 AM

## 2023-07-20 NOTE — Progress Notes (Signed)
 Speech Language Pathology TBI Note  Patient Details  Name: Peggy Ramirez MRN: [id number] Date of Birth: [birth year]  Today's Date: 07/20/2023 SLP Individual Time: 0900-1000 SLP Individual Time Calculation (min): 60 min  Short Term Goals: Week 2: SLP Short Term Goal 1 (Week 2): Pt will utilize external memory aids given modA to recall orientation information w/ 90% accuracy SLP Short Term Goal 2 (Week 2): Pt will complete functional problem solving tasks with maxA SLP Short Term Goal 3 (Week 2): Pt will utilize external memory aids to recall recent events with 25% accuracy and maxA SLP Short Term Goal 4 (Week 2): Pt will sustain attention during functional cognitive tasks for 5 mins given modA  Skilled Therapeutic Interventions:   Pt was greeted in her room for tx tasks targeting cognition. She was awake in her wheelchair, eating her morning meal upon SLP arrival. She requested assistance with oral and personal care and benefited from CGA to ambulate to the sink w/ a walker. Once at the sink, she required modA cues for sequencing and problem solving and maxA cues for attention to task given perseverations on face washing. She varied from supervision to minA physical assistance while standing at the sink given anxiety re falling, however, she responded well to verbal redirections. Once back in her wheelchair, she required minA visual/verbal cues to utilize the calendar and provide orientation information. SLP also assisted pt to create a "to do list" for the day given perseverations on washing her face, putting in her teeth, and other personal care tasks already completed. She then benefited from [facility] verbal/visual cues for problem solving and sequencing during toileting (continent of bladder) at the end of tx session. She was assisted back to her chair and direct hand off was completed with PT. Recommend cont ST per POC.   Pain Pain Assessment Pain Scale: 0-10 Pain Score: 6  Pain Location:  Head  Agitated Behavior Scale: TBI Observation Details Observation Environment: CIR Start of observation period - Date: 07/20/23 Start of observation period - Time: 0900 End of observation period - Date: 07/20/23 End of observation period - Time: 1000 Agitated Behavior Scale (DO NOT LEAVE BLANKS) Short attention span, easy distractibility, inability to concentrate: Present to a slight degree Impulsive, impatient, low tolerance for pain or frustration: Absent Uncooperative, resistant to care, demanding: Absent Violent and/or threatening violence toward people or property: Absent Explosive and/or unpredictable anger: Absent Rocking, rubbing, moaning, or other self-stimulating behavior: Absent Pulling at tubes, restraints, etc.: Absent Wandering from treatment areas: Absent Restlessness, pacing, excessive movement: Absent Repetitive behaviors, motor, and/or verbal: Present to a slight degree Rapid, loud, or excessive talking: Absent Sudden changes of mood: Absent Easily initiated or excessive crying and/or laughter: Absent Self-abusiveness, physical and/or verbal: Absent Agitated behavior scale total score: 16  Therapy/Group: Individual Therapy  [name] 07/20/2023, 9:46 AM

## 2023-07-21 LAB — BASIC METABOLIC PANEL
Anion gap: 9 (ref 5–15)
BUN: 8 mg/dL (ref 8–23)
CO2: 24 mmol/L (ref 22–32)
Calcium: 10 mg/dL (ref 8.9–10.3)
Chloride: 100 mmol/L (ref 98–111)
Creatinine, Ser: 0.59 mg/dL (ref 0.44–1.00)
GFR, Estimated: >60 mL/min (ref >60)
Glucose, Bld: 94 mg/dL (ref 70–99)
Potassium: 3.2 mmol/L — ABNORMAL LOW (ref 3.5–5.1)
Sodium: 133 mmol/L — ABNORMAL LOW (ref 135–145)

## 2023-07-21 MED ORDER — TRAZODONE HCL 50 MG PO TABS
50.0000 mg | ORAL_TABLET | Freq: Every day | ORAL | Status: DC
  Administered 2023-07-21 – 2023-07-22 (×2): 50 mg via ORAL
  Filled 2023-07-21 (×2): qty 1

## 2023-07-21 MED ORDER — POTASSIUM CHLORIDE 20 MEQ PO PACK
20.0000 meq | PACK | Freq: Two times a day (BID) | ORAL | Status: AC
  Administered 2023-07-21 – 2023-07-22 (×4): 20 meq via ORAL
  Filled 2023-07-21 (×4): qty 1

## 2023-07-21 NOTE — Plan of Care (Signed)
  Problem: RH Cognition - SLP Goal: RH LTG Patient will demonstrate orientation with cues Description:  LTG:  Patient will demonstrate orientation to person/place/time/situation with cues (SLP)   Note: Utilize calendar w/ modA cues to provide orientation information    Problem: RH Problem Solving Goal: LTG Patient will demonstrate problem solving for (SLP) Description: LTG:  Patient will demonstrate problem solving for basic/complex daily situations with cues  (SLP) Flowsheets (Taken 07/21/2023 0941) LTG Patient will demonstrate problem solving for: Maximal Assistance - Patient 25 - 49%   Problem: RH Attention Goal: LTG Patient will demonstrate this level of attention during functional activites (SLP) Description: LTG:  Patient will will demonstrate this level of attention during functional activites (SLP) Flowsheets (Taken 07/21/2023 0941) LTG: Patient will demonstrate this level of attention during cognitive/linguistic activities with assistance of (SLP): Maximal Assistance - Patient 25 - 49%

## 2023-07-21 NOTE — Progress Notes (Signed)
 Patient has been awake and calling for various care needs several times an hour this shift. She called for eye drops, which she was given as scheduled. She called for help to get up to void. She was helped up multiple times and voided small amounts each time. She did pull her IV out once while trying to get up. She was anxious about having this nurse assess to place a new IV.  She does not have IV antibiotics ordered at this time.  Telesitter ordered and present in the room.  She was given reassurance and supportive listening throughout the shift. Will continue to monitor.

## 2023-07-21 NOTE — Patient Care Conference (Signed)
 Inpatient RehabilitationTeam Conference and Plan of Care Update Date: 07/21/2023   Time: 1029 am    Patient Name: Peggy Ramirez      Medical Record Number: [id number]  Date of Birth: [birth year] Sex: Female         Room/Bed: 4W15C/4W15C-01 Payor Info: Payor: [employer]vertising [employer] MEDICARE / Plan: [facility] MEDICARE / Product Type: *No Product type* /    Admit Date/Time:  07/08/2023  7:07 PM  Primary Diagnosis:  Traumatic subdural hematoma [facility])  Hospital Problems: Principal Problem:   Traumatic subdural hematoma (HCC) Active Problems:   Absence of bladder continence   SIADH (syndrome of inappropriate ADH production) (HCC)   Urinary tract infection without hematuria    Expected Discharge Date: Expected Discharge Date: 07/25/23  Team Members Present: Physician leading conference: Dr. Elijah Birk Social Worker Present: Cecile Sheerer, LCSWA Nurse Present: Konrad Dolores, RN PT Present: Malachi Pro, PT OT Present: Jake Shark, OT SLP Present: Other (comment) Jarold Song SLP)     Current Status/Progress Goal Weekly Team Focus  Bowel/Bladder   Patient has completed IV antibitoics for UTI and is now on oral antibiotics. She continues to have frequency and urgency all hours of the night. Her last BM was 07/20/23. She remains continent of bowel and bladder.      Continue with time toileting    Remain continent of bowel and bladder  Swallow/Nutrition/ Hydration               ADL's   Setup/supervision for UB ADLs; CGA for LB ADLs & toileting. Increased anxiety and perseverative thoughts/behaviors. Requires encouragement for independence (learned helplessness?).   Supervision   Discharge planning, cognitive retraining, ongoing caregiver education    Mobility   supervision bed mobility, CGA transfers, CGA/minA gait with RW x300'   Supervision  balance, ambulation, endurance, DC prep    Communication   [facility]            Safety/Cognition/ Behavioral Observations  maxA for  problem solving, sequencing, attention, memory; can use the calendar with modA to provide orientation info   downgraded to maxA   functional problem solving, orientation, visual aids for memory    Pain   She has been gettign PRN tylenol at bedtime to assure she is not in pain when trying to sleep. No other pain medication given or requested.      <4 w/ prn's     <4 w/ prns  Skin   Patient skin is intact.    Skin free from infection  entire stay on rehab with minimal assistance   Skin free from infection  entire stay on rehab with minimal assistance    Discharge Planning:  Pt will discharge to home and [name], as well as other children are willing to assist. Fam edu completed. [facility] for HHPT/OT/SLP/aide. SW will confirm there are no barriers to discharge.   Team Discussion: Patient admitted post fall with traumatic SDH and scalp laceration. Patient with urinary tract infection,  headaches, sleeplessness , fluctuating blood pressure : medications adjusted by MD. Patient limited by inattention, feeling of helplessness,  anxiety, perseveration and cognitive deficits.    Patient on target to meet rehab goals: yes, patient requires Setup/supervision for UB ADLs; Patient requires CGA for LB ADLs , toileting and transfers. Patient is able to ambulate CGA/minA up to 300' using a  RW.  *See Care Plan and progress notes for long and short-term goals.   Revisions to Treatment Plan:  Telesitter Behavior plan initiated  Teaching Needs: Safety, medications, toileting, transfers, etc.   Current Barriers to Discharge: Decreased caregiver support and Behavior  Possible Resolutions to Barriers: Family education Home health follow up DME: walker     Medical Summary Current Status: Medically complicated by cognitive deficits, left-sided hemineglect, severe headaches, UTI on antibiotics, hypertension, and SIADH  Barriers to Discharge: Behavior/Mood;Electrolyte  abnormality;Uncontrolled Hypertension;Uncontrolled Pain;Medical stability;Self-care education;Infection/IV Antibiotics   Possible Resolutions to [employer]: pain medication adjustment for headaches, encourage PO intakes, adjust BP regimen, monitor sodium, finish antibiotics for UTI, repeat CT head towards the end of this week for surgical planning for possible bur hole   Continued Need for Acute Rehabilitation Level of Care: The patient requires daily medical management by a physician with specialized training in physical medicine and rehabilitation for the following reasons: Direction of a multidisciplinary physical rehabilitation program to maximize functional independence : Yes Medical management of patient stability for increased activity during participation in an intensive rehabilitation regime.: Yes Analysis of laboratory values and/or radiology reports with any subsequent need for medication adjustment and/or medical intervention. : Yes   I attest that I was present, lead the team conference, and concur with the assessment and plan of the team.   Gwenyth Allegra 07/21/2023, 1:10 PM

## 2023-07-21 NOTE — Progress Notes (Signed)
 Physical Therapy Session Note  Patient Details  Name: Peggy Ramirez MRN: [id number] Date of Birth: 1934-04-26  Today's Date: 07/21/2023 PT Individual Time: 1032-1100 PT Individual Time Calculation (min): 28 min   Today's Date: 07/21/2023 PT Individual Time: 1402-1500 PT Individual Time Calculation (min): 58 min   Short Term Goals: Week 2:  PT Short Term Goal 1 (Week 2): STGs = LTGs  Skilled Therapeutic Interventions/Progress Updates:     1st Session: Pt received seated at RN station and agrees to therapy. No complaint of pain. WC transport to gym for time management. Pt transfers to Nustep with CGA and cues for positioning. Pt completes Nustep for endurance training, reciprocal coordination training, and challenging attention to task. Pt completes x10:00 at workload of 5 with average steps per minute ~40. PT provides cues for hand and foot placement and completing full available ROM. WC transport back to room. Pt transfers to toilet with minA and cues for use of grab bars, as well as assistance pulling down pants. Pt transfers back to [facility] with CGA/minA. Pt left seated at RN station with all needs within reach.   2nd Session: Pt received seated in [facility] with son in room, putting jigsaw puzzle together. No complaint of pain and agrees to therapy. Son requesting that pt practic stepping up on a stool to be able to access high vehicles. Pt stands from [facility] with CGA and cues for initiation, then ambulates x300' without AD, with consistent cueing to maintain upright gaze to improve posture and balance, and increasing stride length and gait speed to decrease risk for falls. Pt takes seated rest break. Pt then completes car transfer with 8 inch platform step, and minA with cues for step sequencing and positioning. Pt requires modA to exit vehicle due to difficulty understanding PT cues and safe sequencing of transition. Seated rest break. Pt ambulates x300' back to room with same assistance and cues, then  completes transfer to toilet with imnA and cues for positioning. Pt ambulates from room to dayroom without AD. Pt performs high level gait training with 6" step, 5" step, and 8" step. Pt ambulates x60' total and completes each step twice, with PT providing minA and up to [facility] for high steps, with cues for step sequencing. Pt ambulates back to [facility] with same assistance and cues. Left seated in WC at RN station.   Therapy Documentation Precautions:  Precautions Precautions: None Restrictions Weight Bearing Restrictions Per Provider Order: No    Therapy/Group: Individual Therapy  Beau Fanny, PT, DPT 07/21/2023, 4:32 PM

## 2023-07-21 NOTE — Progress Notes (Signed)
{  Inpatient Rehab Care Coordinator [phone]

## 2023-07-21 NOTE — Progress Notes (Addendum)
 Speech Language Pathology Daily Session Note  Patient Details  Name: Peggy Ramirez MRN: [id number] Date of Birth: 10/30/1933  Today's Date: 07/21/2023 SLP Individual Time: 0900-1000 SLP Individual Time Calculation (min): 60 min  Short Term Goals: Week 2: SLP Short Term Goal 1 (Week 2): Pt will utilize external memory aids given modA to recall orientation information w/ 90% accuracy SLP Short Term Goal 2 (Week 2): Pt will complete functional problem solving tasks with maxA SLP Short Term Goal 3 (Week 2): Pt will utilize external memory aids to recall recent events with 25% accuracy and maxA SLP Short Term Goal 4 (Week 2): Pt will sustain attention during functional cognitive tasks for 5 mins given modA  Skilled Therapeutic Interventions:   Pt greeted in her room; up in her wheelchair eating breakfast. She was communicating w/ nursing via the call light upon SLP arrival and requesting her eye drops. She benefited from maxA cues to utilize visual aid (schedule) and ID personal care already completed (including eye drops). She was also able to utilize her calendar w/ only minA visual/verbal cues to provide orientation information. SLP then facilitated verbal category ID task targeting attention, organization, and functional problem solving and the pt benefited from maxA verbal cues to complete. She also benefited from maxA cues to follow directions, reduce perseverations re sorting by #s vs suit, and remain attentive during card sorting task from fo 4. Of note, she also benefited from intermittent rest breaks throughout tx tasks given notable fatigue. Upon SLP departure, she was left with a written task to complete while in her chair with the alarm set and call light within reach. POC updated given inconsistent mastery of original cog goals. Cont w/ POC otherwise.     Pain Pain Assessment Pain Scale: 0-10 Pain Score: 0-No pain  Therapy/Group: Individual Therapy  [name] 07/21/2023,  9:33 AM

## 2023-07-21 NOTE — Progress Notes (Signed)
 Occupational Therapy Session Note  Patient Details  Name: Peggy Ramirez MRN: [id number] Date of Birth: 1933/12/01  Today's Date: 07/21/2023 OT Individual Time: 4098-1191 OT Individual Time Calculation (min): 59 min    Short Term Goals: Week 2:  OT Short Term Goal 1 (Week 2): STG= LTG d/t ELOS  Skilled Therapeutic Interventions/Progress Updates:    Pt received supine with no c/o pain, agreeable to OT session. She was confused on time of day, thinking it was night time but overall oriented to place and time of year/month. She requested to take a shower. She came to EOB with (S). She stood with supervision using the RW and completed functional mobility into the bathroom with close (S), shuffling gait but overall safe with the RW. She required min cueing for sequencing safe transfer to the toilet. She demonstrated continued learned helplessness throughout session, asking for help before attempting self care tasks. She requested assist to pull down pants but with cueing did so with (S). She voided very small amount of urine and reported pain in the lower R quadrant of her abdomen. She required mod cueing to doff clothing for shower and min A. Walk in shower transfer with CGA, mod cueing. She completed hair care and UB bathing seated with min cueing for perseverative washing and rewashing of hair especially ,but no physical assist needed. LB bathing in standing with close (S). She transferred back to the w/c following. She donned a shirt with mod cueing for LUE attention/inclusion into task. She was able to don deodorant with good incorporation of the LUE but poor proprioception/attention to limb. Pants donned with cueing for attempting herself before insisting on help and min A overall. Oral care at the sink with min A. Hearing aids donned by OT. She initiated eating breakfast and used BUE but often L inattention would limit functional LUE use. OT created a daily checklist to assist with memory of daily  routine/tasks. She was able to recall needing to put salt on her eggs d/t low sodium. Pt was left sitting up in the w/c with all needs met, chair alarm set, and call bell within reach.   Therapy Documentation Precautions:  Precautions Precautions: None Restrictions Weight Bearing Restrictions Per Provider Order: No  Therapy/Group: Individual Therapy  [name] 07/21/2023, 6:26 AM

## 2023-07-21 NOTE — Progress Notes (Addendum)
 PROGRESS NOTE  Peggy Ramirez  DOB: 12-09-1933  PCP: Estanislado Pandy, MD ZOX:[id number]  DOA: 07/08/2023  LOS: 13 days  Hospital Day: 14  Brief narrative: Peggy Ramirez is a 89 y.o. female with PMH significant for traumatic subdural hematoma on seizure prophylaxis medications, lupus, Sjogren syndrome, membranous glomerulonephritis, history of CVA. 3/8-3/12, was hospitalized for posttraumatic intracranial hemorrhage secondary to a mechanical fall and discharged to CIR Since 3/12, she was gradually improving in rehab but her labs showed stated drop in sodium level.   3/20, her mental status also declined, raising suspicion of UTI and hence hospitalist service was consulted. Of note, repeat CT head 3/26 showed enlarging subdural hematoma with midline shift.  No intervention recommended by neurosurgery. See below for details  Subjective: Patient was seen and examined this morning. Sitting in a chair at nursing station, playing with puzzles.  Alert, awake, not restless or agitated.  Assessment and plan: Acute metabolic encephalopathy Multifactorial etiology: Hyponatremia, enlarging subdural hematoma, UTI Mental status gradually improving. 3/23, I switched from scheduled melatonin to as needed melatonin.  Hyponatremia SIADH Serum osmolality 270, urine osmolality 276, urine sodium 60 -suspect SIADH from brain bleed Sodium level gradually improving with salt tab. Currently continued on reduced dose of salt tab at 1 g daily.  Continue to monitor. Labs stable.  If continues to remain stable, we will sign off tomorrow. Recent Labs  Lab 07/15/23 0509 07/16/23 0604 07/16/23 1655 07/16/23 2204 07/17/23 0937 07/17/23 1546 07/18/23 0538 07/19/23 0920 07/20/23 0606 07/21/23 0516  NA 130* 127* 124* 125* 130* 128* 133* 130* 132* 133*   UTI  Urine culture grew>100000 Enterococcus and Pseudomonas.   Was initially started on meropenem.   Currently on oral amoxicillin and IV cefepime.   Discussed with pharmacy.   Started on 5 more days of oral Cipro and oral amoxicillin with probiotics.  Subdural hematoma with midline shift Follow-up CT head showed increasing size with increased midline shift.  Per rehab team, this was discussed with neurosurgeon.  Recommended no surgical intervention. Noted a plan for repeat CT scan tomorrow.  Hypokalemia Potassium level low at 3.2 today.  Replacement ordered. Recent Labs  Lab 07/17/23 1546 07/18/23 0538 07/19/23 0920 07/20/23 0606 07/21/23 0516  K 3.4* 3.3* 3.5 3.4* 3.2*   Mobility: Encourage ambulation  Goals of care   Code Status: Full Code     DVT prophylaxis:  Place TED hose Start: 07/08/23 1925   Antimicrobials: Oral amoxicillin and ciprofloxacin Fluid: None Family Communication: None at bedside  Level of care:   Currently in CIR.   TRH will sign off today.  Discussed with CIR Dr. Shearon Stalls.   Diet:  Diet Order             Diet regular Fluid consistency: Thin; Fluid restriction: 1200 mL Fluid  Diet effective now                   Scheduled Meds:  amLODipine  2.5 mg Oral Daily   amoxicillin  500 mg Oral Q8H   vitamin C  1,000 mg Oral Daily   Carboxymethylcellul-Glycerin  1 drop Ophthalmic QID   ciprofloxacin  250 mg Oral BID   docusate sodium  100 mg Oral BID   multivitamin with minerals  1 tablet Oral Daily   potassium chloride  20 mEq Oral BID   saccharomyces boulardii  250 mg Oral BID   senna-docusate  1 tablet Oral BID   simvastatin  20 mg Oral Daily  sodium chloride  1 g Oral Daily   traZODone  50 mg Oral QHS    PRN meds: acetaminophen, ALPRAZolam, diphenhydrAMINE **OR** diphenhydrAMINE, melatonin, polyethylene glycol, polyvinyl alcohol   Infusions:     Antimicrobials: Anti-infectives (From admission, onward)    Start     Dose/Rate Route Frequency Ordered Stop   07/20/23 2000  ciprofloxacin (CIPRO) tablet 250 mg        250 mg Oral 2 times daily 07/20/23 1141 07/24/23 1959    07/17/23 2200  amoxicillin (AMOXIL) capsule 500 mg        500 mg Oral Every 8 hours 07/17/23 1626 07/24/23 2159   07/17/23 2000  ceFEPIme (MAXIPIME) 1 g in sodium chloride 0.9 % 100 mL IVPB  Status:  Discontinued        1 g 200 mL/hr over 30 Minutes Intravenous Every 12 hours 07/17/23 1626 07/20/23 1140   07/17/23 0045  meropenem (MERREM) 500 mg in sodium chloride 0.9 % 100 mL IVPB  Status:  Discontinued        500 mg 200 mL/hr over 30 Minutes Intravenous Every 12 hours 07/16/23 2350 07/17/23 1626   07/16/23 0000  cephALEXin (KEFLEX) 500 MG capsule        500 mg Oral Every 12 hours 07/16/23 0518     07/14/23 2000  cephALEXin (KEFLEX) capsule 500 mg  Status:  Discontinued        500 mg Oral Every 12 hours 07/14/23 1344 07/16/23 0925       Objective: Vitals:   07/20/23 1927 07/21/23 0552  BP: (!) 149/63 (!) 156/79  Pulse: 88 62  Resp: 19 17  Temp: 99.6 F (37.6 C) 99.1 F (37.3 C)  SpO2: 100% 98%    Intake/Output Summary (Last 24 hours) at 07/21/2023 1243 Last data filed at 07/20/2023 1903 Gross per 24 hour  Intake 477 ml  Output --  Net 477 ml   Filed Weights   07/19/23 1426 07/20/23 0500 07/21/23 0500  Weight: 44.2 kg 40.7 kg 40 kg   Weight change: -4.2 kg Body mass index is 17.81 kg/m.   Physical Exam: General exam: Pleasant, elderly Caucasian female.  Not in pain. Skin: No rashes, lesions or ulcers. HEENT: Atraumatic, normocephalic, no obvious bleeding Lungs: Clear to auscultation bilaterally,  CVS: S1, S2, no murmur,   GI/Abd: Soft, nontender, nondistended, bowel sound present,   CNS: Alert, awake, oriented to place and person. Psychiatry: Mood appropriate,  Extremities: No pedal edema, no calf tenderness,   Data Review: I have personally reviewed the laboratory data and studies available.  F/u labs ordered Unresulted Labs (From admission, onward)     Start     Ordered   07/23/23 0500  Basic metabolic panel  Once,   R        07/21/23 1234   07/13/23 0500   CBC  Every Monday (0500),   R     Question:  Specimen collection method  Answer:  Lab=Lab collect   07/12/23 1218   07/13/23 0500  Comprehensive metabolic panel  Every Monday (4098),   R      07/12/23 1220           Total time spent in review of labs and imaging, patient evaluation, formulation of plan, documentation and communication with family: 45 minutes  Signed, Lorin Glass, MD [facility] 07/21/2023

## 2023-07-21 NOTE — Progress Notes (Signed)
 PROGRESS NOTE   Subjective/Complaints:  Vitals overnight significant for intermittent low-grade fever, Tmax 99.5. Remains hypertensive but improved. Labs a.m. significant for stable sodium 133, downtrending potassium 3.2. Last bowel movement 3-24, medium.  Continent of bowel and bladder.  Patient mildly anxious this a.m., wanting to get assistance with putting on her shoes.  No other complaints.  No current headache.  ROS: Denies fevers, chills, CP, SOB, diarrhea.   + Headache -severe, right-sided--intermittent, ongoing + Confusion--improved + anxiety  Objective:   No results found.   Recent Labs    07/20/23 0606  WBC 6.8  HGB 12.7  HCT 36.5  PLT 406*     Recent Labs    07/20/23 0606 07/21/23 0516  NA 132* 133*  K 3.4* 3.2*  CL 98 100  CO2 25 24  GLUCOSE 90 94  BUN 8 8  CREATININE 0.52 0.59  CALCIUM 10.0 10.0    Intake/Output Summary (Last 24 hours) at 07/21/2023 8295 Last data filed at 07/20/2023 1903 Gross per 24 hour  Intake 477 ml  Output --  Net 477 ml        Physical Exam: Vital Signs Blood pressure (!) 156/79, pulse 62, temperature 99.1 F (37.3 C), resp. rate 17, height 4\' 11"  (1.499 m), weight 40 kg, SpO2 98%.  General: No apparent distress.  Sitting up at bedside. Heart: Regular rate and rhythm ; mild systolic murmur Lungs: Clear to auscultation, breathing unlabored, no rales or wheezes.  Abdomen: Positive bowel sounds, soft nontender to palpation, nondistended Extremities: mild edema in right upper extremity; otherwise no edema or deformity. IV in L wrist Skin: C/D/I. No apparent lesions.   Small scalp laceration-healing.  Staples removed. Psych: a little anxious at times   MSK:      No apparent deformity.  Full passive range of motion all 4 extremities. Neurologic exam:  Cognition: AAO to person, place and day with cues, month and year without cues + Moderate memory  deficits--ongoing + Mild left hemineglect--ongoing, waxes and wanes with cognition  Insight:  poor insight into current condition.  Mood: Anxious Motor: Antigravity against resistance in all 4 extremities, mild left upper and lower extremity weakness 5-/5.  + Left upper extremity ataxia--ongoing, mild + Left upper extremity flexor synergy; no apparent tone or spasticity-ongoing  Unchanged 3-25  Assessment/Plan: 1. Functional deficits which require 3+ hours per day of interdisciplinary therapy in a comprehensive inpatient rehab setting. Physiatrist is providing close team supervision and 24 hour management of active medical problems listed below. Physiatrist and rehab team continue to assess barriers to discharge/monitor patient progress toward functional and medical goals  Care Tool:  Bathing    Body parts bathed by patient: Right arm, Left arm, Left upper leg, Right lower leg, Chest, Abdomen, Front perineal area, Buttocks, Right upper leg, Left lower leg, Face         Bathing assist Assist Level: Contact Guard/Touching assist     Upper Body Dressing/Undressing Upper body dressing   What is the patient wearing?: Bra, Pull over shirt    Upper body assist Assist Level: Minimal Assistance - Patient > 75%    Lower Body Dressing/Undressing Lower body dressing  What is the patient wearing?: Pants, Incontinence brief     Lower body assist Assist for lower body dressing: Minimal Assistance - Patient > 75%     Toileting Toileting    Toileting assist Assist for toileting: Minimal Assistance - Patient > 75%     Transfers Chair/bed transfer  Transfers assist  Chair/bed transfer activity did not occur: Safety/medical concerns  Chair/bed transfer assist level: Contact Guard/Touching assist     Locomotion Ambulation   Ambulation assist      Assist level: Minimal Assistance - Patient > 75% Assistive device: Walker-rolling Max distance: 150'   Walk 10 feet  activity   Assist     Assist level: Minimal Assistance - Patient > 75% Assistive device: Walker-rolling   Walk 50 feet activity   Assist    Assist level: Minimal Assistance - Patient > 75% Assistive device: Walker-rolling    Walk 150 feet activity   Assist    Assist level: Minimal Assistance - Patient > 75% Assistive device: Walker-rolling    Walk 10 feet on uneven surface  activity   Assist     Assist level: Minimal Assistance - Patient > 75% Assistive device: Development worker, international aid     Assist Is the patient using a wheelchair?: Yes Type of Wheelchair: Manual    Wheelchair assist level: Dependent - Patient 0% Max wheelchair distance: 150    Wheelchair 50 feet with 2 turns activity    Assist        Assist Level: Dependent - Patient 0%   Wheelchair 150 feet activity     Assist      Assist Level: Dependent - Patient 0%   Blood pressure (!) 156/79, pulse 62, temperature 99.1 F (37.3 C), resp. rate 17, height 4\' 11"  (1.499 m), weight 40 kg, SpO2 98%. Medical Problem List and Plan: 1. Functional deficits secondary to traumatic SDH with scalp laceration stapled in the ED as well as history Sjorgens syndrome/SLE             -patient may shower             -ELOS/Goals: 10-14 days S- DC held due to increased SDH and hyponatremia as below, tentatively 3-29             -Stable to continue CIR  3-19: Urine culture pending, hyponatremia worsened this a.m., nightly hypertension remains with transition from amlodipine to ACE inhibitor.  Per family request, we will extend length of stay through Saturday, with expectation that left-sided hemineglect and cognition may not fully improve in that time.  3-20: CT head with increasing SDH from 10.5 mm to 13 mm, with increased mass effect on the right lateral ventricle and partial sulci effacement.  Discussed with Dr. Maisie Fus, do not feel urgent intervention is warranted, recommended repeat CT head in 1  week to see if bleed has liquefied and possible burr hole evacuation at that time.  Monitor closely for status changes--clinically improved 3/21-23  3/25: Supervision UBD, CGA LBD, complicated by learned helplessness. Max A for SLP cog. Complicated by very poor sleep over last 2-3 days per nursing.   -Per Dr. Maisie Fus, repeat CT head 3-26, depending on results we will follow-up outpatient versus schedule bur hole procedure  2.  Antithrombotics: -DVT/anticoagulation:  Mechanical: Antiembolism stockings, thigh (TED hose) Bilateral lower extremities             -antiplatelet therapy: N/A  3. Pain Management: Tylenol as needed  -No complaints of pain, headaches. 3-14: Complaining  of severe right-sided headaches, 10 out of 10, not responsive to Tylenol.  Has documented history of allergy to Topamax and with elevated LFTs, will not schedule max dose Tylenol.  Switched to Fioricet 1-2 tabs every 6 hours as needed. -07/12/23 HA this morning, but not given fioricet yet. Asked nurse to give this. No other reports afterwards from nursing; monitor 3-18: Headaches poorly controlled with Fioricet.  Have documented allergy to Topamax, however patient and family are not aware what this is, no description comments.  Will start Topamax 25 mg twice daily, with p.o. and IV Benadryl as needed in case of allergy reaction. 3-19: No reported headaches with Topamax.  Switch Fioricet to Tylenol.  with family concern for worsening cognition, will monitor overall attention with this and DC Topamax if necessary.  3-20: DC Topamax due to worsening confusion.--Has Tylenol as needed for headaches  3-24: Intermittent Fioricet over the weekend for headaches, with benefit.  Likely to further confused due to barbital, continue Tylenol  4. Mood/Behavior/Sleep: Trazodone 75 mg nightly.  Provide emotional support             -antipsychotic agents: Seroquel nightly as needed  -3-13: Add as needed melatonin. Add sleep log 3/14: Sleeping  well overnight--did have documented use of Seroquel, no events documented overnight, spotted well.  Continue current medications. 3-18: Intermittently agitated overnight, per family get Xanax with dinner at baseline.  Will add back Xanax 0.25 mg as as needed for anxiety.  Continue sleep log. 3-19: Use Xanax and melatonin overnight; sleep improved, remains emotionally labile/anxious.  Agree with therapy assessment that this is likely to only improve when she is back in a home environment 3-20: DC trazodone due to possible contributor to SIADH; continue with nightly melatonin and as needed Xanax (home medication) 3-24: Slept approximately 4 hours per log.  Melatonin taken from scheduled to as needed yesterday.  Monitor 1 day, if no further sleep would schedule again.  5. Neuropsych/cognition: This patient is not capable of making decisions on her own behalf. -3-14: Subarachnoid hemorrhage 3-8, should be finishing 7 days of Keppra today.  Hopefully will improve once Keppra is off.   3-18: Per family, it is approximately at cognitive baseline; ongoing left hemineglect is worse 3-19: Hemineglect appears improved, however family concerned that still not back to where she was before rehab admission. - Continue TeleSitter for now 3-20: Worsening hemineglect today.  No other acute neurologic signs, vitally stable.  Will get repeat CT head given worsening headaches and deficits.  Treat hyponatremia as below. 3-21: CT head as above with worsening SDH, mass effect from 10 to 13 mm.  Discussed with Dr. Maisie Fus, neurosurgery.  Treating UTI and SIADH as below; seems to be improving her cognition. 3-24: Will reach out to Dr. Maisie Fus this week about when to repeat CT head  6. Skin/Wound Care: Routine skin checks, should be ready for staple removal on 3/17   - 3/18: Order for staple removal placed  7. Fluids/Electrolytes/Nutrition: Routine in and outs with follow-up chemistries, continue vitamins and supplements   -07/12/23 BMP yesterday basically WNL except Ca 10.4; monitor routine labs 3-17: Mild hyponatremia as below.  P.o. intakes are stable, albumin stable at 2.8. -Hyponatremia as below.  P.o. intakes do appear adequate, 75% of meals. 3/24: K 40 meq today for repletion 3-25: Potassium 3.2 today; add 20 mEq twice daily for 2 days, then recheck  8.  Seizure prophylaxis.  Completed 7-day course of Keppra.   9.  History of CVA.  Plavix currently on hold due to SDH.   10.  Hyperlipidemia.  Continue Zocor 20mg  daily   11.  Hypertension.  Restart Norvasc 2.5mg  daily.    - monitor with therapies--tolerating well  -07/12/23 BPs good, monitor  3-17: Some intermittent hypertension overnight, discussed increase of Norvasc to 5 mg daily--family concerned that this this was a contributor to her fall, so we will switch from this to losartan 25 mg daily  3-19: More hypertensive overnight, normotensive this a.m.  Just started losartan today, so will monitor for effect.  May need to switch BP medications overnight.  3-20: BP improved today, but with worsening hyponatremia, will DC ACE inhibitor.  Monitor overnight and may resume amlodipine 2.5 mg in a.m.  3-21: Diastolic remains soft, continue IV fluids and holding amlodipine.   -07/18/23 BPs improving, monitor with current holding of meds -07/19/23 BPs a little up, but don't think we need to adjust meds further today 3-24: BPs remain a little high, resume amlodipine 2.5 mg daily Vitals:   07/18/23 0531 07/18/23 1251 07/18/23 1345 07/18/23 1956  BP: (!) 158/70 (!) 113/93 (!) 168/67 (!) 143/71   07/19/23 0429 07/19/23 1430 07/19/23 2051 07/20/23 0514  BP: (!) 157/55 (!) 168/82 (!) 163/75 (!) 143/51   07/20/23 1300 07/20/23 1600 07/20/23 1927 07/21/23 0552  BP: (!) 140/65 (!) (P) 150/66 (!) 149/63 (!) 156/79     12.  Hyponatremia/SIADH.  continue sodium chloride tablets.  Follow-up chemistries - resolved on admission labs; reduce chloride tabs form TID to 1 g  BID; recheck Saturday for further titration -07/12/23 labs yesterday with appropriate NaCl levels, continue dose for now . 3/ 17: Mild hyponatremia 133 this a.m.; placed 1200 cc fluid restriction and continue salt tabs.  Repeat in 2 to 3 days 3/19: NA now down to 130; increase salt tabs to 3 times daily 3-20: NA down to 127.  Increase salt tabs to 2 tabs twice daily.  DC Topamax, trazodone, and losartan.  Urine sodium, osmolality ordered.  Chest x-ray negative.  Start IV fluids normal saline at 75 cc/h and repeat BMP every 12 hours 3-21: NA down to 124 overnight after 3 hours of IV fluid and increasing salt tabs; hospitalist did, appreciate recommendations and care.  Urine studies back, consistent with SIADH.  Without further adjustment, sodium improved to 130 this a.m.  Repeat BMP this afternoon and daily over the weekend to ensure stability, hospitalist to monitor over the weekend -07/19/23 Na 130 today, hospitalist following, appreciate their assistance, continue reduced dose salt tab 1g daily 3/24: Na 132 on NA 1 G PO daily only and fluid restriction; appreciate hospitalist recs 3-25: NA stable 133; hospitalist likely to sign off, DC IV.  Can likely move labs to every other day     Latest Ref Rng & Units 07/21/2023    5:16 AM 07/20/2023    6:06 AM 07/19/2023    9:20 AM  BMP  Glucose 70 - 99 mg/dL 94  90  914   BUN 8 - 23 mg/dL 8  8  10    Creatinine 0.44 - 1.00 mg/dL 7.82  9.56  2.13   Sodium 135 - 145 mmol/L 133  132  130   Potassium 3.5 - 5.1 mmol/L 3.2  3.4  3.5   Chloride 98 - 111 mmol/L 100  98  97   CO2 22 - 32 mmol/L 24  25  25    Calcium 8.9 - 10.3 mg/dL 08.6  57.8  46.9  13.  History of membranous glomerulonephritis.  Patient not on immunosuppression followed by Dr. Clelia Schaumann   - BUN and CR stable  14. Transaminitis.   - reduce PRN tylenol dose to 325 mg   - recheck Monday-- ordered  15. GERD: continue protonix 40mg  daily  16. Constipation: -07/12/23 no BM in 3-4 days,  will increase colace to BID, monitor, if no BM by tomorrow, may need sorbitol or some other stimulant laxative.  3-17: No recorded bowel movement, add Senokot S1 tablet twice daily and give as needed MiraLAX today 3-24 last bowel movement  17. UTI/urinary urgency/incontinence   - 3-18 urinalysis significant for infection; culture not ordered with urinalysis so added on today   - Start Keflex 500 mg twice daily for 5 days; follow cultures sensitivity  3/19: Continence seems to be improving somewhat, now mixed.  Cultures pending  3-20: Cultures NGTD.  DC Keflex.  3-21: Cultures reincubated and yesterday afternoon grew Pseudomonas and Enterococcus.     -07/19/23 per hospitalist, currently on amox and IV cefepime, following cx  3-24: Continence improving with treatment.  LOS: 13 days A FACE TO FACE EVALUATION WAS PERFORMED  Angelina Sheriff 07/21/2023, 9:09 AM

## 2023-07-22 ENCOUNTER — Inpatient Hospital Stay (HOSPITAL_COMMUNITY)

## 2023-07-22 LAB — CBC WITH DIFFERENTIAL/PLATELET
Abs Immature Granulocytes: 0.02 10*3/uL (ref 0.00–0.07)
Basophils Absolute: 0 10*3/uL (ref 0.0–0.1)
Basophils Relative: 1 %
Eosinophils Absolute: 0 10*3/uL (ref 0.0–0.5)
Eosinophils Relative: 0 %
HCT: 35.1 % — ABNORMAL LOW (ref 36.0–46.0)
Hemoglobin: 12 g/dL (ref 12.0–15.0)
Immature Granulocytes: 0 %
Lymphocytes Relative: 8 %
Lymphs Abs: 0.5 10*3/uL — ABNORMAL LOW (ref 0.7–4.0)
MCH: 31.2 pg (ref 26.0–34.0)
MCHC: 34.2 g/dL (ref 30.0–36.0)
MCV: 91.2 fL (ref 80.0–100.0)
Monocytes Absolute: 0.7 10*3/uL (ref 0.1–1.0)
Monocytes Relative: 10 %
Neutro Abs: 5.3 10*3/uL (ref 1.7–7.7)
Neutrophils Relative %: 81 %
Platelets: 419 10*3/uL — ABNORMAL HIGH (ref 150–400)
RBC: 3.85 MIL/uL — ABNORMAL LOW (ref 3.87–5.11)
RDW: 12.3 % (ref 11.5–15.5)
WBC: 6.6 10*3/uL (ref 4.0–10.5)
nRBC: 0 % (ref 0.0–0.2)

## 2023-07-22 MED ORDER — ALPRAZOLAM 0.25 MG PO TABS
0.2500 mg | ORAL_TABLET | Freq: Once | ORAL | Status: AC
  Administered 2023-07-22: 0.25 mg via ORAL
  Filled 2023-07-22: qty 1

## 2023-07-22 NOTE — Progress Notes (Signed)
 PROGRESS NOTE   Subjective/Complaints:  Another fever overnight, Tmax 100.  Other vital stable. No further complaints of headache per pain assessment scores; patient does seem a little bit more anxious this a.m., not complaining of headache but complains of pain in lower dentures that resolves with removal.  Last bowel movement 3-24; mixed continence CT head pending    ROS: Denies fevers, chills, CP, SOB, diarrhea.   + Headache -severe, right-sided--intermittent  + Confusion--improved, intermittent + anxiety--ongoing  Objective:   No results found.   Recent Labs    07/20/23 0606  WBC 6.8  HGB 12.7  HCT 36.5  PLT 406*     Recent Labs    07/20/23 0606 07/21/23 0516  NA 132* 133*  K 3.4* 3.2*  CL 98 100  CO2 25 24  GLUCOSE 90 94  BUN 8 8  CREATININE 0.52 0.59  CALCIUM 10.0 10.0   No intake or output data in the 24 hours ending 07/22/23 0740       Physical Exam: Vital Signs Blood pressure (!) 154/60, pulse 70, temperature 97.9 F (36.6 C), resp. rate 18, height 4\' 11"  (1.499 m), weight 40 kg, SpO2 98%.  General: No apparent distress.  Sitting up in bedside wheelchair. Heart: Regular rate and rhythm ; mild systolic murmur Lungs: Clear to auscultation, breathing unlabored, no rales or wheezes.  Abdomen: Positive bowel sounds, soft nontender to palpation, nondistended Extremities: mild edema in right upper extremity; otherwise no edema or deformity. IV in L wrist Skin: C/D/I. No apparent lesions.   Psych: a little anxious at times--unchanged   MSK:      No apparent deformity.  Full passive range of motion all 4 extremities.  Prefers to keep left upper extremity in flexor position unless stimulated.  Neurologic exam:  Cognition: AAO to person, place and day with cues, month and year without cues + Moderate memory deficits--ongoing + Mild left hemineglect--ongoing, waxes and wanes with  cognition  Insight:  poor insight into current condition.  Motor: Antigravity against resistance in all 4 extremities, mild left upper and lower extremity weakness 5-/5.  + Left upper extremity ataxia--ongoing, mild   Unchanged 3-26  Assessment/Plan: 1. Functional deficits which require 3+ hours per day of interdisciplinary therapy in a comprehensive inpatient rehab setting. Physiatrist is providing close team supervision and 24 hour management of active medical problems listed below. Physiatrist and rehab team continue to assess barriers to discharge/monitor patient progress toward functional and medical goals  Care Tool:  Bathing    Body parts bathed by patient: Right arm, Left arm, Left upper leg, Right lower leg, Chest, Abdomen, Front perineal area, Buttocks, Right upper leg, Left lower leg, Face         Bathing assist Assist Level: Contact Guard/Touching assist     Upper Body Dressing/Undressing Upper body dressing   What is the patient wearing?: Bra, Pull over shirt    Upper body assist Assist Level: Minimal Assistance - Patient > 75%    Lower Body Dressing/Undressing Lower body dressing      What is the patient wearing?: Pants, Incontinence brief     Lower body assist Assist for lower body dressing: Minimal  Assistance - Patient > 75%     Editor, commissioning assist Assist for toileting: Minimal Assistance - Patient > 75%     Transfers Chair/bed transfer  Transfers assist  Chair/bed transfer activity did not occur: Safety/medical concerns  Chair/bed transfer assist level: Contact Guard/Touching assist     Locomotion Ambulation   Ambulation assist      Assist level: Minimal Assistance - Patient > 75% Assistive device: Walker-rolling Max distance: 150'   Walk 10 feet activity   Assist     Assist level: Minimal Assistance - Patient > 75% Assistive device: Walker-rolling   Walk 50 feet activity   Assist    Assist level:  Minimal Assistance - Patient > 75% Assistive device: Walker-rolling    Walk 150 feet activity   Assist    Assist level: Minimal Assistance - Patient > 75% Assistive device: Walker-rolling    Walk 10 feet on uneven surface  activity   Assist     Assist level: Minimal Assistance - Patient > 75% Assistive device: Development worker, international aid     Assist Is the patient using a wheelchair?: Yes Type of Wheelchair: Manual    Wheelchair assist level: Dependent - Patient 0% Max wheelchair distance: 150    Wheelchair 50 feet with 2 turns activity    Assist        Assist Level: Dependent - Patient 0%   Wheelchair 150 feet activity     Assist      Assist Level: Dependent - Patient 0%   Blood pressure (!) 154/60, pulse 70, temperature 97.9 F (36.6 C), resp. rate 18, height 4\' 11"  (1.499 m), weight 40 kg, SpO2 98%. Medical Problem List and Plan: 1. Functional deficits secondary to traumatic SDH with scalp laceration stapled in the ED as well as history Sjorgens syndrome/SLE             -patient may shower             -ELOS/Goals: 10-14 days S- DC held due to increased SDH and hyponatremia as below, tentatively 3-29             -Stable to continue CIR  3-19: Urine culture pending, hyponatremia worsened this a.m., nightly hypertension remains with transition from amlodipine to ACE inhibitor.  Per family request, we will extend length of stay through Saturday, with expectation that left-sided hemineglect and cognition may not fully improve in that time.  3-20: CT head with increasing SDH from 10.5 mm to 13 mm, with increased mass effect on the right lateral ventricle and partial sulci effacement.  Discussed with Dr. Maisie Fus, do not feel urgent intervention is warranted, recommended repeat CT head in 1 week to see if bleed has liquefied and possible burr hole evacuation at that time.  Monitor closely for status changes--clinically improved 3/21-23  3/25: Supervision  UBD, CGA LBD, complicated by learned helplessness. Max A for SLP cog. Complicated by very poor sleep over last 2-3 days per nursing.    -3-26 Per Dr. Maisie Fus, repeat CT head, depending on results we will follow-up outpatient versus schedule bur hole procedure--results do show continued subacute expansion, he is discussing with Dr. Conchita Paris MMA embolization today, tentatively may perform over the weekend with routine monitoring and discharge home  2.  Antithrombotics: -DVT/anticoagulation:  Mechanical: Antiembolism stockings, thigh (TED hose) Bilateral lower extremities             -antiplatelet therapy: N/A  3. Pain Management: Tylenol as needed  -No  complaints of pain, headaches. 3-14: Complaining of severe right-sided headaches, 10 out of 10, not responsive to Tylenol.  Has documented history of allergy to Topamax and with elevated LFTs, will not schedule max dose Tylenol.  Switched to Fioricet 1-2 tabs every 6 hours as needed. -07/12/23 HA this morning, but not given fioricet yet. Asked nurse to give this. No other reports afterwards from nursing; monitor 3-18: Headaches poorly controlled with Fioricet.  Have documented allergy to Topamax, however patient and family are not aware what this is, no description comments.  Will start Topamax 25 mg twice daily, with p.o. and IV Benadryl as needed in case of allergy reaction. 3-19: No reported headaches with Topamax.  Switch Fioricet to Tylenol.  with family concern for worsening cognition, will monitor overall attention with this and DC Topamax if necessary.  3-20: DC Topamax due to worsening confusion.--Has Tylenol as needed for headaches  3-24: Intermittent Fioricet over the weekend for headaches, with benefit.  Likely to further confused due to barbital, continue Tylenol  3-26: Pain well-controlled  4. Mood/Behavior/Sleep: Trazodone 75 mg nightly.  Provide emotional support             -antipsychotic agents: Seroquel nightly as needed  -3-13: Add  as needed melatonin. Add sleep log 3/14: Sleeping well overnight--did have documented use of Seroquel, no events documented overnight, spotted well.  Continue current medications. 3-18: Intermittently agitated overnight, per family get Xanax with dinner at baseline.  Will add back Xanax 0.25 mg as as needed for anxiety.  Continue sleep log. 3-19: Use Xanax and melatonin overnight; sleep improved, remains emotionally labile/anxious.  Agree with therapy assessment that this is likely to only improve when she is back in a home environment 3-20: DC trazodone due to possible contributor to SIADH; continue with nightly melatonin and as needed Xanax (home medication) 3-24: Slept approximately 4 hours per log.  Melatonin taken from scheduled to as needed yesterday.  Monitor 1 day, if no further sleep would schedule again. 3-26: Resumed nightly trazodone 75 mg; repeat labs in a.m.  5. Neuropsych/cognition: This patient is not capable of making decisions on her own behalf. -3-14: Subarachnoid hemorrhage 3-8, should be finishing 7 days of Keppra today.  Hopefully will improve once Keppra is off.   3-18: Per family, it is approximately at cognitive baseline; ongoing left hemineglect is worse 3-19: Hemineglect appears improved, however family concerned that still not back to where she was before rehab admission. - Continue TeleSitter for now 3-20: Worsening hemineglect today.  No other acute neurologic signs, vitally stable.  Will get repeat CT head given worsening headaches and deficits.  Treat hyponatremia as below. 3-21: CT head as above with worsening SDH, mass effect from 10 to 13 mm.  Discussed with Dr. Maisie Fus, neurosurgery.  Treating UTI and SIADH as below; seems to be improving her cognition. 3-24: Will reach out to Dr. Maisie Fus this week about when to repeat CT head--see above  6. Skin/Wound Care: Routine skin checks, should be ready for staple removal on 3/17   - 3/18: Order for staple removal  placed  7. Fluids/Electrolytes/Nutrition: Routine in and outs with follow-up chemistries, continue vitamins and supplements  -07/12/23 BMP yesterday basically WNL except Ca 10.4; monitor routine labs 3-17: Mild hyponatremia as below.  P.o. intakes are stable, albumin stable at 2.8. -Hyponatremia as below.  P.o. intakes do appear adequate, 75% of meals. 3/24: K 40 meq today for repletion 3-25: Potassium 3.2 today; add 20 mEq twice daily for 2  days, then recheck  8.  Seizure prophylaxis.  Completed 7-day course of Keppra.   9.  History of CVA.  Plavix currently on hold due to SDH.   10.  Hyperlipidemia.  Continue Zocor 20mg  daily   11.  Hypertension.  Restart Norvasc 2.5mg  daily.    - monitor with therapies--tolerating well  -07/12/23 BPs good, monitor  3-17: Some intermittent hypertension overnight, discussed increase of Norvasc to 5 mg daily--family concerned that this this was a contributor to her fall, so we will switch from this to losartan 25 mg daily  3-19: More hypertensive overnight, normotensive this a.m.  Just started losartan today, so will monitor for effect.  May need to switch BP medications overnight.  3-20: BP improved today, but with worsening hyponatremia, will DC ACE inhibitor.  Monitor overnight and may resume amlodipine 2.5 mg in a.m.  3-21: Diastolic remains soft, continue IV fluids and holding amlodipine.   -07/18/23 BPs improving, monitor with current holding of meds -07/19/23 BPs a little up, but don't think we need to adjust meds further today 3-24: BPs remain a little high, resume amlodipine 2.5 mg daily--improved Vitals:   07/18/23 1956 07/19/23 0429 07/19/23 1430 07/19/23 2051  BP: (!) 143/71 (!) 157/55 (!) 168/82 (!) 163/75   07/20/23 0514 07/20/23 1300 07/20/23 1600 07/20/23 1927  BP: (!) 143/51 (!) 140/65 (!) (P) 150/66 (!) 149/63   07/21/23 0552 07/21/23 1322 07/21/23 2044 07/22/23 0606  BP: (!) 156/79 132/66 138/63 (!) 154/60     12.  Hyponatremia/SIADH.   continue sodium chloride tablets.  Follow-up chemistries - resolved on admission labs; reduce chloride tabs form TID to 1 g BID; recheck Saturday for further titration -07/12/23 labs yesterday with appropriate NaCl levels, continue dose for now . 3/ 17: Mild hyponatremia 133 this a.m.; placed 1200 cc fluid restriction and continue salt tabs.  Repeat in 2 to 3 days 3/19: NA now down to 130; increase salt tabs to 3 times daily 3-20: NA down to 127.  Increase salt tabs to 2 tabs twice daily.  DC Topamax, trazodone, and losartan.  Urine sodium, osmolality ordered.  Chest x-ray negative.  Start IV fluids normal saline at 75 cc/h and repeat BMP every 12 hours 3-21: NA down to 124 overnight after 3 hours of IV fluid and increasing salt tabs; hospitalist did, appreciate recommendations and care.  Urine studies back, consistent with SIADH.  Without further adjustment, sodium improved to 130 this a.m.  Repeat BMP this afternoon and daily over the weekend to ensure stability, hospitalist to monitor over the weekend -07/19/23 Na 130 today, hospitalist following, appreciate their assistance, continue reduced dose salt tab 1g daily 3/24: Na 132 on NA 1 G PO daily only and fluid restriction; appreciate hospitalist recs 3-25: NA stable 133; hospitalist likely to sign off, DC IV.  Can likely move labs to every other day Repeat labs in a.m.     Latest Ref Rng & Units 07/21/2023    5:16 AM 07/20/2023    6:06 AM 07/19/2023    9:20 AM  BMP  Glucose 70 - 99 mg/dL 94  90  644   BUN 8 - 23 mg/dL 8  8  10    Creatinine 0.44 - 1.00 mg/dL 0.34  7.42  5.95   Sodium 135 - 145 mmol/L 133  132  130   Potassium 3.5 - 5.1 mmol/L 3.2  3.4  3.5   Chloride 98 - 111 mmol/L 100  98  97   CO2 22 -  32 mmol/L 24  25  25    Calcium 8.9 - 10.3 mg/dL 93.7  16.9  67.8     13.  History of membranous glomerulonephritis.  Patient not on immunosuppression followed by Dr. Clelia Schaumann   - BUN and CR stable  14. Transaminitis.   - reduce PRN  tylenol dose to 325 mg   - recheck Monday-- ordered  15. GERD: continue protonix 40mg  daily  16. Constipation: -07/12/23 no BM in 3-4 days, will increase colace to BID, monitor, if no BM by tomorrow, may need sorbitol or some other stimulant laxative.  3-17: No recorded bowel movement, add Senokot S1 tablet twice daily and give as needed MiraLAX today 3-24 last bowel movement--if none today, may need to add as needed sorbitol  17. UTI/urinary urgency/incontinence   - 3-18 urinalysis significant for infection; culture not ordered with urinalysis so added on today   - Start Keflex 500 mg twice daily for 5 days; follow cultures sensitivity  3/19: Continence seems to be improving somewhat, now mixed.  Cultures pending  3-20: Cultures NGTD.  DC Keflex.  3-21: Cultures reincubated and yesterday afternoon grew Pseudomonas and Enterococcus.     -07/19/23 per hospitalist, currently on amox and IV cefepime, following cx  3-24: Continence improving with treatment.   LOS: 14 days A FACE TO FACE EVALUATION WAS PERFORMED  Peggy Ramirez 07/22/2023, 7:40 AM

## 2023-07-22 NOTE — Progress Notes (Signed)
 Occupational Therapy Session Note  Patient Details  Name: Peggy Ramirez MRN: [id number] Date of Birth: 1933/06/06  Today's Date: 07/22/2023 OT Individual Time: 7846-9629 OT Individual Time Calculation (min): 10 min  and Today's Date: 07/22/2023 OT Missed Time: 35 Minutes Missed Time Reason: CT/MRI   Short Term Goals: Week 2:  OT Short Term Goal 1 (Week 2): STG= LTG d/t ELOS  Skilled Therapeutic Interventions/Progress Updates:   Pt received supine with no c/o pain, agreeable to OT session. She requested to go to the bathroom. She came to EOB with min cueing for initiation and (S). She stood from the EOB with close (S). Functional mobility with CGA into the bathroom with the RW, shuffling gait. She completed toileting tasks with min A. Transport arrived to take pt to CT scan. She voided urine and then returned to EOB. She returned to supine and transport took over at bed level.  35 min missed d/t CT scan, will make up as available.    Therapy Documentation Precautions:  Precautions Precautions: None Restrictions Weight Bearing Restrictions Per Provider Order: No  Therapy/Group: Individual Therapy  [name] 07/22/2023, 7:37 AM

## 2023-07-22 NOTE — Progress Notes (Signed)
 Occupational Therapy Session Note  Patient Details  Name: Peggy Ramirez MRN: [id number] Date of Birth: 1933/06/27  Today's Date: 07/22/2023 OT Individual Time: 1445-1510 OT Individual Time Calculation (min): 25 min    Short Term Goals: Week 2:  OT Short Term Goal 1 (Week 2): STG= LTG d/t ELOS  Skilled Therapeutic Interventions/Progress Updates:    Pt received in her w/c with socks and shoes throw across floor. Pt initially sleeping but awoken easily and requesting to go to the bathroom. Per PT- pt had panic attack during session and was given medication following. Pt more confused overall during session. She initiated mobility into the bathroom but had very difficult time sequencing transfer to toilet. She doffed pants and then walked forward and was unable to sequence stepping backward to toilet. Eventually OT pulled pants up, had pt exit bathroom and then re-do entire transfer. This was more successful and pt was able to motor plan with mod cueing. She voided urine. She required mod A with clothing management. She returned to bed, min A for transfer. She was left supine with all needs met, bed alarm set. Pt already asleep.   Therapy Documentation Precautions:  Precautions Precautions: None Restrictions Weight Bearing Restrictions Per Provider Order: No  Therapy/Group: Individual Therapy  [name] 07/22/2023, 3:34 PM

## 2023-07-22 NOTE — Progress Notes (Signed)
 Physical Therapy Session Note  Patient Details  Name: [name] MRN: [id number] Date of Birth: 13-Feb-1934  Today's Date: 07/22/2023 PT Individual Time: 1302-1358 PT Individual Time Calculation (min): 56 min   Short Term Goals: Week 2:  PT Short Term Goal 1 (Week 2): STGs = LTGs  Skilled Therapeutic Interventions/Progress Updates:     Pt received seated in WC and agrees to therapy. NO complaint of pain. Pt requests to urinate. Pt ambulates to toilet with miNA and cues for sequencing and positioning. Pt voids bladder and performs pericare with minA. Following, pt ambulates x73' with minA/modA at trunk and no AD. PT provides consistent cues for upright gaze to improve posture and balance, and increasing stride length and gait speed to decrease risk for falls. Pt also completes ramp navigation with same assistance and cues. Pt is extremely anxious during session, and PT provides calming cues throughout, such as cues for deep, pursed lip breathing. Pt takes extended seated rest break prior to ambulating additional x350' with similar assistance and cues. Left seated in [facility] with RN present.   Therapy Documentation Precautions:  Precautions Precautions: None Restrictions Weight Bearing Restrictions Per Provider Order: No   Therapy/Group: Individual Therapy  Beau Fanny, PT, DPT 07/22/2023, 4:53 PM

## 2023-07-22 NOTE — Progress Notes (Addendum)
 Speech Language Pathology Daily Session Note  Patient Details  Name: Peggy Ramirez MRN: [id number] Date of Birth: 04/15/1934  Today's Date: 07/22/2023 SLP Individual Time: 0900-0958 SLP Individual Time Calculation (min): 58 min  Short Term Goals: Week 2: SLP Short Term Goal 1 (Week 2): Pt will utilize external memory aids given modA to recall orientation information w/ 90% accuracy SLP Short Term Goal 2 (Week 2): Pt will complete functional problem solving tasks with maxA SLP Short Term Goal 3 (Week 2): Pt will utilize external memory aids to recall recent events with 25% accuracy and maxA SLP Short Term Goal 4 (Week 2): Pt will sustain attention during functional cognitive tasks for 5 mins given modA  Skilled Therapeutic Interventions: Skilled therapy session focused on cognitive goals. Upon entrance, patient requesting to go to the bathroom and for a warm blanket. Patient transferred to bathroom with RW and continent of bladder. Patient returned to chair and perseverative on needing warm blanket, teeth in, and to go to the bathroom despite all requests fulfilled by SLP. Patient oriented to self, situation, location, and month, though required modA to utilize calendar for time. Patient seemingly anxious throughout session with increased respiratory rate. SLP targeted problem solving and attention goal through categorization task. Patient required min A to categorize cards according to number and color. Patient left in chair with alarm set and call bell in reach. Continue POC.   Pain None reported to SLP  Therapy/Group: Individual Therapy  Jayleena Stille M.A., CF-SLP 07/22/2023, 7:47 AM

## 2023-07-23 ENCOUNTER — Other Ambulatory Visit (HOSPITAL_COMMUNITY): Payer: Self-pay

## 2023-07-23 LAB — CBC WITH DIFFERENTIAL/PLATELET
Abs Immature Granulocytes: 0.02 10*3/uL (ref 0.00–0.07)
Basophils Absolute: 0.1 10*3/uL (ref 0.0–0.1)
Basophils Relative: 1 %
Eosinophils Absolute: 0 10*3/uL (ref 0.0–0.5)
Eosinophils Relative: 1 %
HCT: 35.3 % — ABNORMAL LOW (ref 36.0–46.0)
Hemoglobin: 12.3 g/dL (ref 12.0–15.0)
Immature Granulocytes: 0 %
Lymphocytes Relative: 11 %
Lymphs Abs: 0.7 10*3/uL (ref 0.7–4.0)
MCH: 31.5 pg (ref 26.0–34.0)
MCHC: 34.8 g/dL (ref 30.0–36.0)
MCV: 90.5 fL (ref 80.0–100.0)
Monocytes Absolute: 0.7 10*3/uL (ref 0.1–1.0)
Monocytes Relative: 11 %
Neutro Abs: 4.9 10*3/uL (ref 1.7–7.7)
Neutrophils Relative %: 76 %
Platelets: 406 10*3/uL — ABNORMAL HIGH (ref 150–400)
RBC: 3.9 MIL/uL (ref 3.87–5.11)
RDW: 12.1 % (ref 11.5–15.5)
WBC: 6.5 10*3/uL (ref 4.0–10.5)
nRBC: 0 % (ref 0.0–0.2)

## 2023-07-23 LAB — BASIC METABOLIC PANEL WITH GFR
Anion gap: 11 (ref 5–15)
Anion gap: 11 (ref 5–15)
BUN: 13 mg/dL (ref 8–23)
BUN: 14 mg/dL (ref 8–23)
CO2: 24 mmol/L (ref 22–32)
CO2: 25 mmol/L (ref 22–32)
Calcium: 10.2 mg/dL (ref 8.9–10.3)
Calcium: 10.7 mg/dL — ABNORMAL HIGH (ref 8.9–10.3)
Chloride: 93 mmol/L — ABNORMAL LOW (ref 98–111)
Chloride: 94 mmol/L — ABNORMAL LOW (ref 98–111)
Creatinine, Ser: 0.68 mg/dL (ref 0.44–1.00)
Creatinine, Ser: 0.66 mg/dL (ref 0.44–1.00)
GFR, Estimated: >60 mL/min (ref >60)
GFR, Estimated: >60 mL/min (ref >60)
Glucose, Bld: 99 mg/dL (ref 70–99)
Glucose, Bld: 101 mg/dL — ABNORMAL HIGH (ref 70–99)
Potassium: 4.7 mmol/L (ref 3.5–5.1)
Potassium: 4.2 mmol/L (ref 3.5–5.1)
Sodium: 128 mmol/L — ABNORMAL LOW (ref 135–145)
Sodium: 130 mmol/L — ABNORMAL LOW (ref 135–145)

## 2023-07-23 MED ORDER — BUSPIRONE HCL 10 MG PO TABS
5.0000 mg | ORAL_TABLET | Freq: Two times a day (BID) | ORAL | Status: DC
  Administered 2023-07-23 – 2023-07-24 (×3): 5 mg via ORAL
  Filled 2023-07-23 (×3): qty 1

## 2023-07-23 MED ORDER — SODIUM CHLORIDE 1 G PO TABS
1.0000 g | ORAL_TABLET | Freq: Two times a day (BID) | ORAL | Status: DC
  Administered 2023-07-23 – 2023-07-25 (×4): 1 g via ORAL
  Filled 2023-07-23 (×4): qty 1

## 2023-07-23 MED ORDER — ALPRAZOLAM 0.25 MG PO TABS: 0.2500 mg | ORAL_TABLET | Freq: Two times a day (BID) | ORAL | Status: DC | PRN

## 2023-07-23 MED ORDER — SODIUM CHLORIDE 1 G PO TABS: 1.0000 g | ORAL_TABLET | Freq: Two times a day (BID) | ORAL | Status: DC

## 2023-07-23 NOTE — Progress Notes (Signed)
 Physical Therapy Session Note  Patient Details  Name: Peggy Ramirez MRN: [id number] Date of Birth: May 15, 1933  Today's Date: 07/23/2023 PT Individual Time: 2130-8657 PT Individual Time Calculation (min): 57 min   Today's Date: 07/23/2023 PT Individual Time: 1420-1530 PT Individual Time Calculation (min): 70 min    Short Term Goals: Week 2:  PT Short Term Goal 1 (Week 2): STGs = LTGs  Skilled Therapeutic Interventions/Progress Updates:     1st Session: Pt received seated in [facility] with nursing students in room. Pt does not complain of pain and agrees to therapy. WC transport to gym. Pt has anxious affect at initiation of session, so PT provides pt with warm blanket and cues for deep, pursed lip breathing to assist with mood. Pt provided with increased time to ease anxiety prior to mobility. Pt performs sit to stand with minA and cues for initiation. Pt ambulates x15' to parallel bars. Mirror provided for visual feedback. PT cues pt to perform standing abduction with RLE and LLE. Pt initially follows instructions but then begins to attempt to reach for objects around her and perseverating on going to restroom, although pt known to have voided bladder just before session. PT attempts to redirect pt, though has great difficulty and pt continues to not follow instructions. Eventually pt able to follow cue to perform mini squats. Pt performs ~20 mini squats then ambulates back to [facility] with minA and cues for positioning and safety. Extended seated rest break, in which pt continues to perseverate on needing to urinate, needing to get dressed for tomorrow, needing to take shoes off, needing to have a warm blanket, etc. Etc. Pt performs sit to stand with minA/modA, then ambulates x150' with modA at trunk to promote anterior propulsion to prevent pt from anxiously reaching for objects around her. Pt able to void bladder then ambulates back to [facility] with same assistance. Pt left seated in WC at RN station.   2nd  Session: Pt received seated in [facility] and agrees to therapy. No complaint of pain but does report wanting to urinate. PT provides minA/modA to ambulate to toilet with cues for use of grab bars and turning to safely sit on toilet. Pt ambulates to sink to wash hands with son providing assistance and PT providing cues for safe guarding. Following, pt transfer to [facility] with modA and cues for hip positioning and ensuring pt scoots back adequately for safety. WC transport to gym, in which pt begins to extend through legs and hips and nearly pushes out of WC, requiring max cueing for safety and return to seated position. Pt cannot answer why she performed this movement.   Pt participates in dance activity for therapeutic movement and aerobic exercise. PT provides encouragement throughout to participate as pt becomes easily distracted. PT also provides hand over hand assistance for LLE and LUE for therapeutic movements in coordination with visual and auditory cues. WC transport back to room. Stand step to bed with modA and cues for sequencing. Left supine in bed with alarm intact and all needs within reach.   Therapy Documentation Precautions:  Precautions Precautions: None Restrictions Weight Bearing Restrictions Per Provider Order: No   Therapy/Group: Individual Therapy  Beau Fanny, PT, DPT 07/23/2023, 4:28 PM

## 2023-07-23 NOTE — Progress Notes (Signed)
 PROGRESS NOTE   Subjective/Complaints:  Tmax 99.9 yesterday.  Blood pressure, other vital stable.  No complaints of pain, headache; complain of coldness.  Patient increasingly anxious yesterday with therapies.  Per PT, unable to sequence even basic things because she is too anxious about progressing to the next step.  Complicated by worsening and attention.  And labs significant for downtrending sodium, 128.  Potassium repleted to 4.7.  WBCs low and steady.  ROS: Denies fevers, chills, CP, SOB, diarrhea.   + Headache -severe, right-sided--intermittent  + Confusion--improved, intermittent + anxiety--ongoing, worsening  Objective:   CT HEAD WO CONTRAST ( ) Result Date: 07/22/2023 CLINICAL DATA:  89 year old female status post fall on blood thinners with right side subdural hematoma. EXAM: CT HEAD WITHOUT CONTRAST TECHNIQUE: Contiguous axial images were obtained from the base of the skull through the vertex without intravenous contrast. RADIATION DOSE REDUCTION: This exam was performed according to the departmental dose-optimization program which includes automated exposure control, adjustment of the mA and/or kV according to patient size and/or use of iterative reconstruction technique. COMPARISON:  Head CT 07/16/2023 and earlier. FINDINGS: Brain: Right side subdural hematoma has become more hypodense and more lobulated, asymmetric along the periphery of the right lateral hemisphere since 07/16/2023. Dominant right anterior convexity component is mostly hypodense and 14 mm now. Posterior and superior mildly biconvex mixed density component now is 12 mm. Versus on 07/16/2023 a more uniform 10-13 mm thick hematoma. Subsequently, mass effect on the anterior right lateral ventricle is increased. Leftward midline shift is increased and 3 mm. No ventriculomegaly. Basilar cisterns remain patent. Stable gray-white matter differentiation throughout the  brain. No new area of intracranial hemorrhage. Vascular: Calcified atherosclerosis at the skull base. No suspicious intracranial vascular hyperdensity. Skull: Stable, intact. Sinuses/Orbits: Visualized paranasal sinuses and mastoids are stable and well aerated. Other: Stable orbit and scalp soft tissues. IMPRESSION: 1. Right side Subdural Hematoma has become more lobulated and hypodense since 07/16/2023. Now maximal 14 mm thickness along the anterior frontal convexity. Subsequent increased mass effect on the right lateral ventricle and 3 mm leftward midline shift. Basilar cisterns remain patent. 2. No new intracranial abnormality. No skull fracture identified. Electronically Signed   By: Odessa Fleming M.D.   On: 07/22/2023 10:34     Recent Labs    07/22/23 0943 07/23/23 0616  WBC 6.6 6.5  HGB 12.0 12.3  HCT 35.1* 35.3*  PLT 419* 406*     Recent Labs    07/21/23 0516 07/23/23 0616  NA 133* 128*  K 3.2* 4.7  CL 100 93*  CO2 24 24  GLUCOSE 94 99  BUN 8 13  CREATININE 0.59 0.68  CALCIUM 10.0 10.2    Intake/Output Summary (Last 24 hours) at 07/23/2023 0948 Last data filed at 07/22/2023 1816 Gross per 24 hour  Intake 480 ml  Output --  Net 480 ml         Physical Exam: Vital Signs Blood pressure 130/71, pulse 87, temperature 98.4 F (36.9 C), resp. rate (!) 21, height 4\' 11"  (1.499 m), weight 41 kg, SpO2 100%.  General: No apparent distress.  Sitting up at nurses station. Heart: Regular rate and rhythm ; mild systolic  murmur Lungs: Clear to auscultation, breathing unlabored, no rales or wheezes.  Abdomen: Positive bowel sounds, soft nontender to palpation, nondistended Extremities: mild edema in right upper extremity; otherwise no edema or deformity. IV in L wrist Skin: C/D/I. No apparent lesions.   Psych: ioncreasingly anxious--unchanged   MSK:      No apparent deformity.  Full passive range of motion all 4 extremities.  Prefers to keep left upper extremity in flexor position  unless stimulated--unchanged  Neurologic exam:  Cognition: AAO to person, place and day with cues, month and year without cues + Moderate memory deficits--ongoing + Moderate left hemineglect--worsened over the last day or so  Insight:  poor insight into current condition.  Motor: Antigravity against resistance in all 4 extremities, left upper extremity 4 out of 5 mostly limited by hemineglect, left lower extremity 5- out of 5  + Left upper extremity ataxia--ongoing, mild   Unchanged 3-26  Assessment/Plan: 1. Functional deficits which require 3+ hours per day of interdisciplinary therapy in a comprehensive inpatient rehab setting. Physiatrist is providing close team supervision and 24 hour management of active medical problems listed below. Physiatrist and rehab team continue to assess barriers to discharge/monitor patient progress toward functional and medical goals  Care Tool:  Bathing    Body parts bathed by patient: Right arm, Left arm, Left upper leg, Right lower leg, Chest, Abdomen, Front perineal area, Buttocks, Right upper leg, Left lower leg, Face         Bathing assist Assist Level: Contact Guard/Touching assist     Upper Body Dressing/Undressing Upper body dressing   What is the patient wearing?: Bra, Pull over shirt    Upper body assist Assist Level: Minimal Assistance - Patient > 75%    Lower Body Dressing/Undressing Lower body dressing      What is the patient wearing?: Pants, Incontinence brief     Lower body assist Assist for lower body dressing: Minimal Assistance - Patient > 75%     Toileting Toileting    Toileting assist Assist for toileting: Minimal Assistance - Patient > 75%     Transfers Chair/bed transfer  Transfers assist  Chair/bed transfer activity did not occur: Safety/medical concerns  Chair/bed transfer assist level: Contact Guard/Touching assist     Locomotion Ambulation   Ambulation assist      Assist level: Minimal  Assistance - Patient > 75% Assistive device: Walker-rolling Max distance: 150'   Walk 10 feet activity   Assist     Assist level: Minimal Assistance - Patient > 75% Assistive device: Walker-rolling   Walk 50 feet activity   Assist    Assist level: Minimal Assistance - Patient > 75% Assistive device: Walker-rolling    Walk 150 feet activity   Assist    Assist level: Minimal Assistance - Patient > 75% Assistive device: Walker-rolling    Walk 10 feet on uneven surface  activity   Assist     Assist level: Minimal Assistance - Patient > 75% Assistive device: Development worker, international aid     Assist Is the patient using a wheelchair?: Yes Type of Wheelchair: Manual    Wheelchair assist level: Dependent - Patient 0% Max wheelchair distance: 150    Wheelchair 50 feet with 2 turns activity    Assist        Assist Level: Dependent - Patient 0%   Wheelchair 150 feet activity     Assist      Assist Level: Dependent - Patient 0%   Blood pressure  130/71, pulse 87, temperature 98.4 F (36.9 C), resp. rate (!) 21, height 4\' 11"  (1.499 m), weight 41 kg, SpO2 100%. Medical Problem List and Plan: 1. Functional deficits secondary to traumatic SDH with scalp laceration stapled in the ED as well as history Sjorgens syndrome/SLE             -patient may shower             -ELOS/Goals: 10-14 days S- DC held due to increased SDH and hyponatremia as below, tentatively 3-29             -Stable to continue CIR  3-19: Urine culture pending, hyponatremia worsened this a.m., nightly hypertension remains with transition from amlodipine to ACE inhibitor.  Per family request, we will extend length of stay through Saturday, with expectation that left-sided hemineglect and cognition may not fully improve in that time.  3-20: CT head with increasing SDH from 10.5 mm to 13 mm, with increased mass effect on the right lateral ventricle and partial sulci effacement.   Discussed with Dr. Maisie Fus, do not feel urgent intervention is warranted, recommended repeat CT head in 1 week to see if bleed has liquefied and possible burr hole evacuation at that time.  Monitor closely for status changes--clinically improved 3/21-23  3/25: Supervision UBD, CGA LBD, complicated by learned helplessness. Max A for SLP cog. Complicated by very poor sleep over last 2-3 days per nursing.    -3-26 Per Dr. Maisie Fus, repeat CT head, depending on results we will follow-up outpatient versus schedule bur hole procedure--results do show continued subacute expansion, he is discussing with Dr. Conchita Paris MMA embolization--plan for outpatient follow-up in Dr. Val Riles office early next week and outpatient procedure planning.  Discussed with family, believe they would be able to take care of patient at home given her current level of function in the interim.  2.  Antithrombotics: -DVT/anticoagulation:  Mechanical: Antiembolism stockings, thigh (TED hose) Bilateral lower extremities             -antiplatelet therapy: N/A  3. Pain Management: Tylenol as needed  -No complaints of pain, headaches. 3-14: Complaining of severe right-sided headaches, 10 out of 10, not responsive to Tylenol.  Has documented history of allergy to Topamax and with elevated LFTs, will not schedule max dose Tylenol.  Switched to Fioricet 1-2 tabs every 6 hours as needed. -07/12/23 HA this morning, but not given fioricet yet. Asked nurse to give this. No other reports afterwards from nursing; monitor 3-18: Headaches poorly controlled with Fioricet.  Have documented allergy to Topamax, however patient and family are not aware what this is, no description comments.  Will start Topamax 25 mg twice daily, with p.o. and IV Benadryl as needed in case of allergy reaction. 3-19: No reported headaches with Topamax.  Switch Fioricet to Tylenol.  with family concern for worsening cognition, will monitor overall attention with this and DC  Topamax if necessary.  3-20: DC Topamax due to worsening confusion.--Has Tylenol as needed for headaches  3-24: Intermittent Fioricet over the weekend for headaches, with benefit.  Likely to further confused due to barbital, continue Tylenol  3-26: Pain well-controlled--no further headaches or apparent phootophobia  4. Mood/Behavior/Sleep: Trazodone 75 mg nightly.  Provide emotional support             -antipsychotic agents: Seroquel nightly as needed  -3-13: Add as needed melatonin. Add sleep log 3/14: Sleeping well overnight--did have documented use of Seroquel, no events documented overnight, spotted well.  Continue current  medications. 3-18: Intermittently agitated overnight, per family get Xanax with dinner at baseline.  Will add back Xanax 0.25 mg as as needed for anxiety.  Continue sleep log. 3-19: Use Xanax and melatonin overnight; sleep improved, remains emotionally labile/anxious.  Agree with therapy assessment that this is likely to only improve when she is back in a home environment 3-20: DC trazodone due to possible contributor to SIADH; continue with nightly melatonin and as needed Xanax (home medication) 3-24: Slept approximately 4 hours per log.  Melatonin taken from scheduled to as needed yesterday.  Monitor 1 day, if no further sleep would schedule again. 3-26: Resumed nightly trazodone 75 mg; repeat labs in a.m. 3-27: Sodium back down, remove trazodone.  Worsening anxiety, significantly limiting for therapies, discussed with family and agreeable to adding on BuSpar 5 mg twice daily, increasing Xanax to 0.25 mg twice daily as needed.  Would be cautious to add additional sedating medications.  5. Neuropsych/cognition: This patient is not capable of making decisions on her own behalf. -3-14: Subarachnoid hemorrhage 3-8, should be finishing 7 days of Keppra today.  Hopefully will improve once Keppra is off.   3-18: Per family, it is approximately at cognitive baseline; ongoing left  hemineglect is worse 3-19: Hemineglect appears improved, however family concerned that still not back to where she was before rehab admission. - Continue TeleSitter for now 3-20: Worsening hemineglect today.  No other acute neurologic signs, vitally stable.  Will get repeat CT head given worsening headaches and deficits.  Treat hyponatremia as below. 3-21: CT head as above with worsening SDH, mass effect from 10 to 13 mm.  Discussed with Dr. Maisie Fus, neurosurgery.  Treating UTI and SIADH as below; seems to be improving her cognition. 3-24: Will reach out to Dr. Maisie Fus this week about when to repeat CT head--see above  6. Skin/Wound Care: Routine skin checks, should be ready for staple removal on 3/17   - 3/18: Order for staple removal placed  7. Fluids/Electrolytes/Nutrition: Routine in and outs with follow-up chemistries, continue vitamins and supplements  -07/12/23 BMP yesterday basically WNL except Ca 10.4; monitor routine labs 3-17: Mild hyponatremia as below.  P.o. intakes are stable, albumin stable at 2.8. -Hyponatremia as below.  P.o. intakes do appear adequate, 75% of meals. 3/24: K 40 meq today for repletion 3-25: Potassium 3.2 today; add 20 mEq twice daily for 2 days, then recheck 3-27: Potassium repleted to 4.7  8.  Seizure prophylaxis.  Completed 7-day course of Keppra.   9.  History of CVA.  Plavix currently on hold due to SDH.   10.  Hyperlipidemia.  Continue Zocor 20mg  daily   11.  Hypertension.  Restart Norvasc 2.5mg  daily.    - monitor with therapies--tolerating well  -07/12/23 BPs good, monitor  3-17: Some intermittent hypertension overnight, discussed increase of Norvasc to 5 mg daily--family concerned that this this was a contributor to her fall, so we will switch from this to losartan 25 mg daily  3-19: More hypertensive overnight, normotensive this a.m.  Just started losartan today, so will monitor for effect.  May need to switch BP medications overnight.  3-20: BP  improved today, but with worsening hyponatremia, will DC ACE inhibitor.  Monitor overnight and may resume amlodipine 2.5 mg in a.m.  3-21: Diastolic remains soft, continue IV fluids and holding amlodipine.   -07/18/23 BPs improving, monitor with current holding of meds -07/19/23 BPs a little up, but don't think we need to adjust meds further today 3-24: BPs remain  a little high, resume amlodipine 2.5 mg daily--improved Vitals:   07/20/23 0514 07/20/23 1300 07/20/23 1600 07/20/23 1927  BP: (!) 143/51 (!) 140/65 (!) (P) 150/66 (!) 149/63   07/21/23 0552 07/21/23 1322 07/21/23 2044 07/22/23 0606  BP: (!) 156/79 132/66 138/63 (!) 154/60   07/22/23 1429 07/22/23 2029 07/23/23 0545 07/23/23 0854  BP: 128/77 137/79 (!) 148/70 130/71     12.  Hyponatremia/SIADH.  continue sodium chloride tablets.  Follow-up chemistries - resolved on admission labs; reduce chloride tabs form TID to 1 g BID; recheck Saturday for further titration -07/12/23 labs yesterday with appropriate NaCl levels, continue dose for now . 3/ 17: Mild hyponatremia 133 this a.m.; placed 1200 cc fluid restriction and continue salt tabs.  Repeat in 2 to 3 days 3/19: NA now down to 130; increase salt tabs to 3 times daily 3-20: NA down to 127.  Increase salt tabs to 2 tabs twice daily.  DC Topamax, trazodone, and losartan.  Urine sodium, osmolality ordered.  Chest x-ray negative.  Start IV fluids normal saline at 75 cc/h and repeat BMP every 12 hours 3-21: NA down to 124 overnight after 3 hours of IV fluid and increasing salt tabs; hospitalist did, appreciate recommendations and care.  Urine studies back, consistent with SIADH.  Without further adjustment, sodium improved to 130 this a.m.  Repeat BMP this afternoon and daily over the weekend to ensure stability, hospitalist to monitor over the weekend -07/19/23 Na 130 today, hospitalist following, appreciate their assistance, continue reduced dose salt tab 1g daily 3/24: Na 132 on NA 1 G PO  daily only and fluid restriction; appreciate hospitalist recs 3-25: NA stable 133; hospitalist likely to sign off, DC IV.  Can likely move labs to every other day 3-27: Sodium down to 128.  DC trazodone as above, however likely due to expanding SDH.  See management as above.  Increase sodium tabs to 1 g twice daily, repeat BMP tonight     Latest Ref Rng & Units 07/23/2023    6:16 AM 07/21/2023    5:16 AM 07/20/2023    6:06 AM  BMP  Glucose 70 - 99 mg/dL 99  94  90   BUN 8 - 23 mg/dL 13  8  8    Creatinine 0.44 - 1.00 mg/dL 1.91  4.78  2.95   Sodium 135 - 145 mmol/L 128  133  132   Potassium 3.5 - 5.1 mmol/L 4.7  3.2  3.4   Chloride 98 - 111 mmol/L 93  100  98   CO2 22 - 32 mmol/L 24  24  25    Calcium 8.9 - 10.3 mg/dL 62.1  30.8  65.7     13.  History of membranous glomerulonephritis.  Patient not on immunosuppression followed by Dr. Clelia Schaumann   - BUN and CR stable  14. Transaminitis.   - reduce PRN tylenol dose to 325 mg   - recheck Monday-- ordered  15. GERD: continue protonix 40mg  daily  16. Constipation: -07/12/23 no BM in 3-4 days, will increase colace to BID, monitor, if no BM by tomorrow, may need sorbitol or some other stimulant laxative.  3-17: No recorded bowel movement, add Senokot S1 tablet twice daily and give as needed MiraLAX today 3-24 last bowel movement--if none today, may need to add as needed sorbitol 3-27: Give as needed sorbitol tonight if no bowel movement today; nursing informed  17. UTI/urinary urgency/incontinence   - 3-18 urinalysis significant for infection; culture not ordered with  urinalysis so added on today   - Start Keflex 500 mg twice daily for 5 days; follow cultures sensitivity  3/19: Continence seems to be improving somewhat, now mixed.  Cultures pending  3-20: Cultures NGTD.  DC Keflex.  3-21: Cultures reincubated and yesterday afternoon grew Pseudomonas and Enterococcus.     -07/19/23 per hospitalist, currently on amox and IV cefepime,  following cx  3-24: Continence improving with treatment.  3-27: Documented as incontinent again today, likely due to increased SDH and SIADH, worsening cognition.  Given intermittent fevers, will recheck urine.   18. FUO. On Tx for UTI as above. No s/s other infection, WBCs stable. Likely due to worsening SDH.   LOS: 15 days A FACE TO FACE EVALUATION WAS PERFORMED  Angelina Sheriff 07/23/2023, 9:48 AM

## 2023-07-23 NOTE — Progress Notes (Signed)
 Occupational Therapy Session Note  Patient Details  Name: Peggy Ramirez MRN: [id number] Date of Birth: March 26, 1934  Today's Date: 07/23/2023 OT Individual Time: 5638-7564 OT Individual Time Calculation (min): 26 min    Short Term Goals: Week 2:  OT Short Term Goal 1 (Week 2): STG= LTG d/t ELOS  Skilled Therapeutic Interventions/Progress Updates:     Pt received sitting up in wc at nurses desk, dozing off, presenting to be calm and receptive to skilled OT session reporting 0/10 pain- OT offering intermittent rest breaks, repositioning, and therapeutic support to optimize participation in therapy session. Focused this session on functional cognition, activity tolerance, and BADL retraining. Pt requesting to use restroom at beginning of session- Pt transported back to room in wc for toileting to adhere to timed toileting schedule and per Pt preference. Pt completed functional mobility ~8 ft to bathroom with min HHA- Pt anxious and fearful of falling, shuffling feet, and reaching for objects to grab to increase stability with maximal therapeutic support and encouragement provided. Pt was able to maintain balance during clothing management with CGA with L UE supported on grab bar and participate in doffing/donning pants with mod multimodal cues. Pt completed anterior peri-care in seated position on toilet with mod verbal cues +increased time. Engaged Pt in completing hand washing and grooming/hygiene tasks standing at sink for increased balance and activity tolerance challenge and to work on problem solving, attention, and functional skills. Incorporated L UE in hand wash and when washing face with max HOH A required. Mod multimodal cues required for sequencing during grooming/hygiene tasks and max therapeutic support provided d/t Pt significant fear of falling. [name] present at end of session. Pt was left resting in wc with call bell in reach, seatbelt alarm on, tele-sitter on, son present in room,  and all needs met.    Therapy Documentation Precautions:  Precautions Precautions: None Restrictions Weight Bearing Restrictions Per Provider Order: No   Therapy/Group: Individual Therapy  Clide Deutscher 07/23/2023, 8:00 AM

## 2023-07-23 NOTE — Progress Notes (Signed)
 Speech Language Pathology Daily Session Note  Patient Details  Name: TANVEER DOBBERSTEIN MRN: [id number] Date of Birth: 03/01/1934  Today's Date: 07/23/2023 SLP Individual Time: 7829-5621 SLP Individual Time Calculation (min): 45 min  Short Term Goals: Week 2: SLP Short Term Goal 1 (Week 2): Pt will utilize external memory aids given modA to recall orientation information w/ 90% accuracy SLP Short Term Goal 2 (Week 2): Pt will complete functional problem solving tasks with maxA SLP Short Term Goal 3 (Week 2): Pt will utilize external memory aids to recall recent events with 25% accuracy and maxA SLP Short Term Goal 4 (Week 2): Pt will sustain attention during functional cognitive tasks for 5 mins given modA  Skilled Therapeutic Interventions:   Pt greeted in her room for tx session targeting cognition. She was eating breakfast upon SLP arrival, with her orange juice spilt on her and the remainder of her plate. SLP assisted w/ clean up and provided maxA cues for problem solving. She also required maxA cues for problem solving when taking her morning medications d/t oral holding of meds w/ liquids. SLP assisted pt to the bathroom (handled assist to ambulate) upon request where she was continent of bowel. She required max-totalA visual/verbal/tactile cues for sequencing, safety awareness, and problem solving throughout toileting as she repeatedly attempted to stand up and walk away while still going to the bathroom. Once toileting was complete, attempted oral and personal care at the sink. She again required maxA cues for problem solving and sequencing to wash her hands. Unable to complete oral care d/t time constraints. She required constant cueing for problem solving when ambulating back to her wheelchair d/t pushing and her grasp on the sink. Once back in her chair, she was left with the alarm on and call light within reach. Nursing students also remained at pt's bedside upon SLP departure. Recommend  cont ST.   Pain  Headache - "hurts a little." See EMR for more info.   Therapy/Group: Individual Therapy  [name] 07/23/2023, 8:02 AM

## 2023-07-24 ENCOUNTER — Other Ambulatory Visit (HOSPITAL_COMMUNITY): Payer: Self-pay

## 2023-07-24 ENCOUNTER — Inpatient Hospital Stay (HOSPITAL_COMMUNITY)

## 2023-07-24 DIAGNOSIS — S065X9S Traumatic subdural hemorrhage with loss of consciousness of unspecified duration, sequela: Secondary | ICD-10-CM

## 2023-07-24 LAB — BASIC METABOLIC PANEL WITH GFR
Anion gap: 8 (ref 5–15)
Anion gap: 8 (ref 5–15)
BUN: 14 mg/dL (ref 8–23)
BUN: 10 mg/dL (ref 8–23)
CO2: 28 mmol/L (ref 22–32)
CO2: 23 mmol/L (ref 22–32)
Calcium: 10.5 mg/dL — ABNORMAL HIGH (ref 8.9–10.3)
Calcium: 8.1 mg/dL — ABNORMAL LOW (ref 8.9–10.3)
Chloride: 95 mmol/L — ABNORMAL LOW (ref 98–111)
Chloride: 106 mmol/L (ref 98–111)
Creatinine, Ser: 0.57 mg/dL (ref 0.44–1.00)
Creatinine, Ser: 1.13 mg/dL — ABNORMAL HIGH (ref 0.44–1.00)
GFR, Estimated: >60 mL/min (ref >60)
GFR, Estimated: 47 mL/min — ABNORMAL LOW (ref >60)
Glucose, Bld: 101 mg/dL — ABNORMAL HIGH (ref 70–99)
Glucose, Bld: 184 mg/dL — ABNORMAL HIGH (ref 70–99)
Potassium: 3.9 mmol/L (ref 3.5–5.1)
Potassium: 3.7 mmol/L (ref 3.5–5.1)
Sodium: 131 mmol/L — ABNORMAL LOW (ref 135–145)
Sodium: 137 mmol/L (ref 135–145)

## 2023-07-24 LAB — URINALYSIS, ROUTINE W REFLEX MICROSCOPIC
Bilirubin Urine: NEGATIVE
Glucose, UA: NEGATIVE mg/dL
Hgb urine dipstick: NEGATIVE
Ketones, ur: NEGATIVE mg/dL
Nitrite: NEGATIVE
Protein, ur: NEGATIVE mg/dL
Specific Gravity, Urine: 1.015 (ref 1.005–1.030)
pH: 7 (ref 5.0–8.0)

## 2023-07-24 MED ORDER — ACIDOPHILUS LACTOBACILLUS PO CAPS
250.0000 mg | ORAL_CAPSULE | Freq: Two times a day (BID) | ORAL | 0 refills | Status: DC
  Filled 2023-07-24: qty 60, 30d supply, fill #0

## 2023-07-24 MED ORDER — ALPRAZOLAM 0.25 MG PO TABS: 0.2500 mg | ORAL_TABLET | Freq: Every day | ORAL | Status: DC | PRN

## 2023-07-24 MED ORDER — ALPRAZOLAM 0.25 MG PO TABS
0.2500 mg | ORAL_TABLET | Freq: Every day | ORAL | Status: DC
  Administered 2023-07-24: 0.25 mg via ORAL
  Filled 2023-07-24: qty 1

## 2023-07-24 MED ORDER — ACETAMINOPHEN 500 MG PO TABS
1000.0000 mg | ORAL_TABLET | Freq: Three times a day (TID) | ORAL | Status: DC
  Administered 2023-07-24 – 2023-07-25 (×3): 1000 mg via ORAL
  Filled 2023-07-24 (×3): qty 2

## 2023-07-24 MED ORDER — SODIUM CHLORIDE 1 G PO TABS
1.0000 g | ORAL_TABLET | Freq: Two times a day (BID) | ORAL | 0 refills | Status: DC
Start: 2023-07-24 — End: 2023-12-20
  Filled 2023-07-24: qty 60, 30d supply, fill #0

## 2023-07-24 MED ORDER — POLYETHYLENE GLYCOL 3350 17 G PO PACK
17.0000 g | PACK | Freq: Every day | ORAL | Status: DC
  Administered 2023-07-24 – 2023-07-25 (×2): 17 g via ORAL
  Filled 2023-07-24 (×2): qty 1

## 2023-07-24 MED ORDER — MELATONIN 5 MG PO TABS
5.0000 mg | ORAL_TABLET | Freq: Every evening | ORAL | 0 refills | Status: DC | PRN
  Filled 2023-07-24: qty 30, 30d supply, fill #0

## 2023-07-24 MED ORDER — ACETAMINOPHEN 500 MG PO TABS: 1000.0000 mg | ORAL_TABLET | Freq: Four times a day (QID) | ORAL | Status: AC | PRN

## 2023-07-24 MED ORDER — ALPRAZOLAM 0.25 MG PO TABS
0.2500 mg | ORAL_TABLET | Freq: Two times a day (BID) | ORAL | 0 refills | Status: DC | PRN
  Filled 2023-07-24 (×2): qty 30, 15d supply, fill #0

## 2023-07-24 MED ORDER — AMLODIPINE BESYLATE 2.5 MG PO TABS
2.5000 mg | ORAL_TABLET | Freq: Every day | ORAL | 0 refills | Status: DC
  Filled 2023-07-24 (×2): qty 30, 30d supply, fill #0

## 2023-07-24 MED ORDER — POLYETHYLENE GLYCOL 3350 17 G PO PACK: 17.0000 g | PACK | Freq: Every day | ORAL | Status: AC

## 2023-07-24 MED ORDER — BUSPIRONE HCL 5 MG PO TABS
5.0000 mg | ORAL_TABLET | Freq: Two times a day (BID) | ORAL | 0 refills | Status: DC
  Filled 2023-07-24: qty 60, 30d supply, fill #0

## 2023-07-24 NOTE — Progress Notes (Signed)
 Inpatient Rehabilitation Care Coordinator Discharge Note   Patient Details  Name: Peggy Ramirez MRN: [id number] Date of Birth: 14-Jun-1933   Discharge location: D/c to home with family support  Length of Stay: 16 days  Discharge activity level: Min/Mod A  Home/community participation: Limited  Patient response WU:[id number] Literacy - How often do you need to have someone help you when you read instructions, pamphlets, or other written material from your doctor or pharmacy?: Never  Patient response YN:WGNFAO Isolation - How often do you feel lonely or isolated from those around you?: Patient unable to respond  Services provided included: MD, RD, PT, OT, SLP, RN, CM, TR, Pharmacy, Neuropsych, SW  Financial Services:  Field seismologist Utilized: Private Insurance [facility] Medicare  Choices offered to/list presented to: patient son [name]  Follow-up services arranged:  Patient/Family request agency HH/DME, Home Health Home Health Agency: [facility] for PT/OT/SLP/aide      HH/DME Requested Agency: [facility]  Patient response to transportation need: Is the patient able to respond to transportation needs?: Yes In the past 12 months, has lack of transportation kept you from medical appointments or from getting medications?: No In the past 12 months, has lack of transportation kept you from meetings, work, or from getting things needed for daily living?: No   Patient/Family verbalized understanding of follow-up arrangements:  Yes  Individual responsible for coordination of the follow-up plan: contact pt son [name]  Confirmed correct DME delivered: Gretchen Short 07/24/2023    Comments (or additional information):fam edu completed  Summary of Stay    Date/Time Discharge Planning CSW  07/20/23 1137 Pt will discharge to home and [name], as well as other children are willing to assist. Fam edu completed. [facility] for HHPT/OT/SLP/aide. SW will confirm there are no  barriers to discharge. AAC  07/14/23 1414 Pt will discharge to home and [name], as well as other children are willing to assist. SW will confirm there are no barriers to discharge. AAC       Kyran Whittier A Lula Olszewski

## 2023-07-24 NOTE — Progress Notes (Signed)
 Physical Therapy Session Note  Patient Details  Name: Peggy Ramirez MRN: [id number] Date of Birth: 11/28/1933  Today's Date: 07/24/2023 PT Individual Time: 0830-0940 PT Individual Time Calculation (min): 70 min   Short Term Goals: Week 1:  PT Short Term Goal 1 (Week 1): Pt will complete bed mobility with CGA. PT Short Term Goal 1 - Progress (Week 1): Met PT Short Term Goal 2 (Week 1): Pt will complete sit to stand transfer with CGA. PT Short Term Goal 2 - Progress (Week 1): Progressing toward goal PT Short Term Goal 3 (Week 1): Pt will complete bed to chair transfer with minA. PT Short Term Goal 3 - Progress (Week 1): Met PT Short Term Goal 4 (Week 1): Pt will ambulate x150' with CGA and LRAD. PT Short Term Goal 4 - Progress (Week 1): Progressing toward goal Week 2:  PT Short Term Goal 1 (Week 2): STGs = LTGs   Skilled Therapeutic Interventions/Progress Updates:    Session focused on functional mobility and cognitive remediation with focus on grad day activities in preparation of potential d/c (still awaiting neurosurgery plan). Pt performed bed mobility with extra time and verbal cues needed with supervision, repeated x 3 due to wanting to lay back down. Encouraged to don shoes, required assist and then pt kicking them off and on several times but stating she needed them on. Assisted with threading of pants and then sit > stand with CGA for pulling up pants. Noticed brief to be wet, so changed but pt impulsive and trying to walk while changing brief and performing hygiene- min assist needed for balance and safety. Brought pt to sink per request for her to put in her teeth and extra time needed as pt continuing to put them in the sink, etc having difficulty with sequencing. Pt perseverating at times on needing to go to the bathroom (reminded her we already went during the session) and redirected. Simulated car transfer with min assist overall with max cues for technique and extra time. Gait  with RW on uneven surfaces going up/down ramp with min assist overall to help steer with RW during turning and decreased attention to the L. Stair negotiation training for functional mobility and overall strength/endurance as well as attention to task x 8 steps with min assist and step by step cues for foot placement. Functional gait training with grocery cart to simulate a functional activity x 150' with overall CGA to min assist cues to continue task. Returned to w/c and left up at nurses's station with safety belt donned.   Therapy Documentation Precautions:  Precautions Precautions: None Restrictions Weight Bearing Restrictions Per Provider Order: No  Pain: Pain Assessment Pain Scale: 0-10 Pain Score: 0-No pain     Therapy/Group: Individual Therapy  Karolee Stamps Darrol Poke, PT, DPT, CBIS  07/24/2023, 9:44 AM

## 2023-07-24 NOTE — Progress Notes (Signed)
 Occupational Therapy Session Note  Patient Details  Name: Peggy Ramirez MRN: [id number] Date of Birth: 07/30/1933  Today's Date: 07/24/2023 OT Individual Time: 1048-1200 OT Individual Time Calculation (min): 72 min    Short Term Goals: Week 2:  OT Short Term Goal 1 (Week 2): STG= LTG d/t ELOS   Skilled Therapeutic Interventions/Progress Updates:    Pt received on the toilet with NT assisting with no c/o pain, agreeable to OT session. She had difficulty sequencing transfer from toilet into shower. She was unaware of having current BM. She required mod A to doff clothing. She transferred into the shower with min A. She sat on the [facility], cueing to remain seated. She washed UB with good automatic sequencing with (S). LB with CGA standing. She completed hair care with set up assist. Pt with severely kyphotic posture throughout shower. Functional mobility back to EOB with (S), shuffling gait. She donned a shirt with min A. Pants donned with mod A. Pt distracted by family in room as well as perseveration on laying back down. She laid down to take a break. Retrieved a hair dryer. She completed transfer back to the w/c with min A. She was able to dry hair with the blow dryer with min A. She completed oral care with min A. She completed 120 ft of functional mobility with one seated rest break halfway through. She returned to her room. X-ray arrived and OT assisted pt back to bed. They quickly took imaging and then family requested pt be back in w/c for lunch. Pt stood from EOB and pivot to w/c with CGA. Pt was left sitting up in the w/c with all needs met, chair alarm set, and call bell within reach.    Therapy Documentation Precautions:  Precautions Precautions: Fall Restrictions Weight Bearing Restrictions Per Provider Order: No   Therapy/Group: Individual Therapy  [name] 07/24/2023, 1:19 PM2

## 2023-07-24 NOTE — Plan of Care (Signed)
  Problem: RH Cognition - SLP Goal: RH LTG Patient will demonstrate orientation with cues Description:  LTG:  Patient will demonstrate orientation to person/place/time/situation with cues (SLP)   Outcome: Completed/Met   Problem: RH Problem Solving Goal: LTG Patient will demonstrate problem solving for (SLP) Description: LTG:  Patient will demonstrate problem solving for basic/complex daily situations with cues  (SLP) Outcome: Completed/Met   Problem: RH Memory Goal: LTG Patient will use memory compensatory aids to (SLP) Description: LTG:  Patient will use memory compensatory aids to recall biographical/new, daily complex information with cues (SLP) Outcome: Completed/Met   Problem: RH Attention Goal: LTG Patient will demonstrate this level of attention during functional activites (SLP) Description: LTG:  Patient will will demonstrate this level of attention during functional activites (SLP) Outcome: Completed/Met

## 2023-07-24 NOTE — Progress Notes (Signed)
 Physical Therapy Discharge Summary  Patient Details  Name: Peggy Ramirez MRN: [id number] Date of Birth: 1934/02/20  Date of Discharge from PT service:July 24, 2023    Patient has met 9 of 9 long term goals due to improved activity tolerance, improved balance, and improved postural control.  Patient to discharge at an ambulatory level Min Assist.   Patient's care partner is independent to provide the necessary physical and cognitive assistance at discharge. Family agreeable to care for pt at min assist level with plan for outpatient follow up with neurosurgery post discharge from CIR. Pt has fluctuated with participation and level of assist requiring at bed CGA and up to heavier min assist due to cognition and anxiety - pt continues with L sided inattention/neglect and perceptual deficits.   Reasons goals not met: n/a - all goals met  Recommendation:  Patient will benefit from ongoing skilled PT services in home health setting to continue to advance safe functional mobility, address ongoing impairments in strength, balance, functional mobility, cognition, and minimize fall risk.  Equipment: No equipment provided. Pt has RW and W/c already.  Reasons for discharge: treatment goals met and discharge from hospital  Patient/family agrees with progress made and goals achieved: Yes  PT Discharge Precautions/Restrictions Precautions Precautions: Fall Restrictions Weight Bearing Restrictions Per Provider Order: No  Pain Interference Pain Interference Pain Effect on Sleep: 2. Occasionally Pain Interference with Therapy Activities: 1. Rarely or not at all Pain Interference with Day-to-Day Activities: 1. Rarely or not at all Vision/Perception  Vision - History Ability to See in Adequate Light: 0 Adequate Perception Perception: Impaired Preception Impairment Details: Spatial orientation;Inattention/Neglect Praxis Praxis: Impaired Praxis Impairment Details: Motor  planning;Perseveration;Limb apraxia  Cognition Overall Cognitive Status: Impaired/Different from baseline Arousal/Alertness: Awake/alert Attention: Sustained Sustained Attention: Appears intact Sustained Attention Impairment: Verbal basic;Functional basic Memory: Impaired Memory Impairment: Decreased recall of new information;Decreased short term memory;Retrieval deficit;Storage deficit Decreased Short Term Memory: Verbal basic;Functional basic Awareness: Impaired Awareness Impairment: Intellectual impairment Problem Solving: Impaired Problem Solving Impairment: Functional basic;Verbal basic Executive Function: Sequencing;Reasoning;Self Monitoring Reasoning: Impaired Reasoning Impairment: Verbal basic;Functional basic Sequencing: Impaired Sequencing Impairment: Verbal basic;Functional basic Self Monitoring: Impaired Self Monitoring Impairment: Verbal basic;Functional basic Behaviors: Impulsive;Restless;Perseveration Safety/Judgment: Impaired [place] Scales of Cognitive Functioning: Confused, Appropriate: Moderate Assistance Sensation Sensation Light Touch: Appears Intact Hot/Cold: Appears Intact Proprioception: Impaired Detail Proprioception Impaired Details: Impaired LUE;Impaired LLE Coordination Gross Motor Movements are Fluid and Coordinated: No Fine Motor Movements are Fluid and Coordinated: No Coordination and Movement Description: Generalized weakness, L hemi Motor  Motor Motor: Hemiplegia;Motor apraxia;Abnormal postural alignment and control  Mobility Bed Mobility Bed Mobility: Sit to Supine;Supine to Sit Supine to Sit: Contact Guard/Touching assist;Supervision/Verbal cueing Sit to Supine: Contact Guard/Touching assist;Supervision/Verbal cueing Transfers Transfers: Sit to Stand;Stand to Sit;Stand Pivot Transfers Sit to Stand: Contact Guard/Touching assist Stand to Sit: Contact Guard/Touching assist Stand Pivot Transfers: Contact Guard/Touching  assist Stand Pivot Transfer Details: Verbal cues for sequencing;Verbal cues for technique;Verbal cues for precautions/safety Locomotion  Gait Ambulation: Yes Gait Assistance: Minimal Assistance - Patient > 75% Gait Distance (Feet): 150 Feet Assistive device: Rolling walker Gait Assistance Details: Verbal cues for sequencing;Verbal cues for technique;Verbal cues for precautions/safety;Tactile cues for sequencing Stairs / Additional Locomotion Stairs: Yes Stairs Assistance: Minimal Assistance - Patient > 75% Stair Management Technique: Two rails;Step to pattern;Alternating pattern Number of Stairs: 8 Ramp: Minimal Assistance - Patient >75% Pick up small object from the floor assist level: Moderate Assistance - Patient 50 - 74% Wheelchair Mobility Wheelchair Mobility:  No  Trunk/Postural Assessment  Cervical Assessment Cervical Assessment: Exceptions to [facility] (forward head) Thoracic Assessment Thoracic Assessment: Exceptions to [facility] (kyphotic posture) Lumbar Assessment Lumbar Assessment: Exceptions to [facility] Postural Control Postural Control: Deficits on evaluation (posterior tilt) Righting Reactions: delayed Protective Responses: delayed  Balance Balance Balance Assessed: Yes Static Sitting Balance Static Sitting - Balance Support: No upper extremity supported Static Sitting - Level of Assistance: 5: Stand by assistance Dynamic Sitting Balance Dynamic Sitting - Balance Support: Feet supported Dynamic Sitting - Level of Assistance: 5: Stand by assistance Static Standing Balance Static Standing - Balance Support: During functional activity Static Standing - Level of Assistance: 4: Min assist;5: Stand by assistance Dynamic Standing Balance Dynamic Standing - Balance Support: During functional activity Dynamic Standing - Level of Assistance: 4: Min assist Extremity Assessment  RUE Assessment RUE Assessment: Within Functional Limits LUE Assessment LUE Assessment: Exceptions to  [facility] LUE Body System: Neuro Brunstrum levels for arm and hand: Arm;Hand Brunstrum level for arm: Stage IV Movement is deviating from synergy;Stage V Relative Independence from Synergy Brunstrum level for hand: Stage VI Isolated joint movements RLE Assessment RLE Assessment: Exceptions to [facility] General Strength Comments: difficult to fully assess due to cognition; appears grossly WFL decreased muscular endurance LLE Assessment LLE Assessment: Exceptions to [facility] General Strength Comments: difficult to fully assess due to cognition; decreased muscular endurance   Delorise Royals, PT, DPT, CBIS  07/24/2023, 2:42 PM

## 2023-07-24 NOTE — Plan of Care (Signed)
  Problem: RH BOWEL ELIMINATION Goal: RH STG MANAGE BOWEL WITH ASSISTANCE Description: STG Manage Bowel with supervision Assistance. Outcome: Progressing   Problem: RH BLADDER ELIMINATION Goal: RH STG MANAGE BLADDER WITH ASSISTANCE Description: STG Manage Bladder With supervision Assistance Outcome: Progressing   Problem: RH SKIN INTEGRITY Goal: RH STG SKIN FREE OF INFECTION/BREAKDOWN Description: Manage skin free of infection/ breakdown with supervision Assistance. Outcome: Progressing   Problem: RH SAFETY Goal: RH STG ADHERE TO SAFETY PRECAUTIONS W/ASSISTANCE/DEVICE Description: STG Adhere to Safety Precautions With supervision  Assistance/Device. Outcome: Progressing   Problem: RH PAIN MANAGEMENT Goal: RH STG PAIN MANAGED AT OR BELOW PT'S PAIN GOAL Description: <4w/ prns Outcome: Progressing

## 2023-07-24 NOTE — Progress Notes (Signed)
 PROGRESS NOTE   Subjective/Complaints:  Fever again yesterday afternoon, 100, some mild hypertension this a.m.  Otherwise, vitally stable.  Patient continues to endorse feeling cold, asking for a blanket  Some mixed continence of urine yesterday.  Patient answers yes to urinary urgency and dysuria.  No notable pyuria.  She states this is not a change from when she started antibiotics.   Last bowel movement 3-27 AM, medium.    NA overnight and this a.m. uptrending, from [phone]. Urinalysis yesterday improved from prior, some leukocytosis and mild bacteria, no nitrates.  Got as needed melatonin for sleep overnight; has not been getting nightly Xanax for at least the last 2 days.  This morning, patient intermittently falling asleep on initial exam, once out of bed more awake/alert but very anxious.  During ambulation in the hallway with PT and her daughter, she says to provider "I do not think anybody cares about me".   ROS: Denies fevers, chills, CP, SOB, diarrhea.   + Headache -severe, right-sided--intermittent; not currently present + Confusion--intermittent, ongoing + anxiety--significantly worsened since last Friday; ongoing  Objective:   No results found.    Recent Labs    07/22/23 0943 07/23/23 0616  WBC 6.6 6.5  HGB 12.0 12.3  HCT 35.1* 35.3*  PLT 419* 406*     Recent Labs    07/23/23 1803 07/24/23 0703  NA 130* 131*  K 4.2 3.9  CL 94* 95*  CO2 25 28  GLUCOSE 101* 101*  BUN 14 14  CREATININE 0.66 0.57  CALCIUM 10.7* 10.5*    Intake/Output Summary (Last 24 hours) at 07/24/2023 1001 Last data filed at 07/23/2023 1900 Gross per 24 hour  Intake 195 ml  Output --  Net 195 ml         Physical Exam: Vital Signs Blood pressure (!) 160/82, pulse 76, temperature 99.4 F (37.4 C), resp. rate 18, height 4\' 11"  (1.499 m), weight 41 kg, SpO2 98%.  General: Laying in bed. Appropriate appearance. No  acute distress.  Heart: Regular rate and rhythm ; mild systolic murmur.  Lungs: Clear to auscultation, breathing unlabored, no rales or wheezes.  Abdomen: Positive bowel sounds, soft nontender to palpation, nondistended Extremities:  no edema or deformity. IV in L wrist Skin: C/D/I. No apparent lesions.   Psych: anxious--unchanged   MSK:      No apparent deformity.  Full passive range of motion all 4 extremities.  Prefers to keep left upper extremity in flexor position unless stimulated-  Neurologic exam:  Cognition: AAO to person, place and day with cues, month and year without cues + Moderate memory deficits--ongoing + Moderate left hemineglect--better attention to LUE today  Insight:  poor insight into current condition.  Motor: Antigravity against resistance in all 4 extremities, left upper extremity 4 out of 5 mostly limited by hemineglect, left lower extremity 5- out of 5  + Left upper extremity ataxia--ongoing, mild   Unchanged 3-28  Assessment/Plan: 1. Functional deficits which require 3+ hours per day of interdisciplinary therapy in a comprehensive inpatient rehab setting. Physiatrist is providing close team supervision and 24 hour management of active medical problems listed below. Physiatrist and rehab team continue to  assess barriers to discharge/monitor patient progress toward functional and medical goals  Care Tool:  Bathing    Body parts bathed by patient: Right arm, Left arm, Left upper leg, Right lower leg, Chest, Abdomen, Front perineal area, Buttocks, Right upper leg, Left lower leg, Face         Bathing assist Assist Level: Contact Guard/Touching assist     Upper Body Dressing/Undressing Upper body dressing   What is the patient wearing?: Bra, Pull over shirt    Upper body assist Assist Level: Minimal Assistance - Patient > 75%    Lower Body Dressing/Undressing Lower body dressing      What is the patient wearing?: Pants, Incontinence brief      Lower body assist Assist for lower body dressing: Minimal Assistance - Patient > 75%     Toileting Toileting    Toileting assist Assist for toileting: Minimal Assistance - Patient > 75%     Transfers Chair/bed transfer  Transfers assist  Chair/bed transfer activity did not occur: Safety/medical concerns  Chair/bed transfer assist level: Contact Guard/Touching assist     Locomotion Ambulation   Ambulation assist      Assist level: Moderate Assistance - Patient 50 - 74% Assistive device: No Device Max distance: 150'   Walk 10 feet activity   Assist     Assist level: Moderate Assistance - Patient - 50 - 74% Assistive device: No Device   Walk 50 feet activity   Assist    Assist level: Moderate Assistance - Patient - 50 - 74% Assistive device: No Device    Walk 150 feet activity   Assist    Assist level: Moderate Assistance - Patient - 50 - 74% Assistive device: No Device    Walk 10 feet on uneven surface  activity   Assist     Assist level: Minimal Assistance - Patient > 75% Assistive device: Walker-rolling   Wheelchair     Assist Is the patient using a wheelchair?: Yes Type of Wheelchair: Manual    Wheelchair assist level: Dependent - Patient 0% Max wheelchair distance: 150    Wheelchair 50 feet with 2 turns activity    Assist        Assist Level: Dependent - Patient 0%   Wheelchair 150 feet activity     Assist      Assist Level: Dependent - Patient 0%   Blood pressure (!) 160/82, pulse 76, temperature 99.4 F (37.4 C), resp. rate 18, height 4\' 11"  (1.499 m), weight 41 kg, SpO2 98%. Medical Problem List and Plan: 1. Functional deficits secondary to traumatic SDH with scalp laceration stapled in the ED as well as history Sjorgens syndrome/SLE             -patient may shower             -ELOS/Goals: 10-14 days S- DC held due to increased SDH and hyponatremia as below, tentatively 3-29             -Stable to  continue CIR  3-19: Urine culture pending, hyponatremia worsened this a.m., nightly hypertension remains with transition from amlodipine to ACE inhibitor.  Per family request, we will extend length of stay through Saturday, with expectation that left-sided hemineglect and cognition may not fully improve in that time.  3-20: CT head with increasing SDH from 10.5 mm to 13 mm, with increased mass effect on the right lateral ventricle and partial sulci effacement.  Discussed with Dr. Maisie Fus, do not feel urgent intervention is  warranted, recommended repeat CT head in 1 week to see if bleed has liquefied and possible burr hole evacuation at that time.  Monitor closely for status changes--clinically improved 3/21-23  3/25: Supervision UBD, CGA LBD, complicated by learned helplessness. Max A for SLP cog. Complicated by very poor sleep over last 2-3 days per nursing.    -3-26 Per Dr. Maisie Fus, repeat CT head, depending on results we will follow-up outpatient versus schedule bur hole procedure--results do show continued subacute expansion, he is discussing with Dr. Conchita Paris MMA embolization--plan for outpatient follow-up in Dr. Val Riles office early next week and outpatient procedure planning.  Discussed with family, offered extended LOS, son believes they would be able to take care of patient at home given her current level of function in the interim.  3/28: The patient is anticipated medically ready for discharge 3/29 to home and will need follow-up with [facility]. In addition, they will need to follow up with their PCP, Neurosurgery - Has appt scheduled with Dr. Conchita Paris Monday afternoon to discuss MMA.     2.  Antithrombotics: -DVT/anticoagulation:  Mechanical: Antiembolism stockings, thigh (TED hose) Bilateral lower extremities             -antiplatelet therapy: N/A  3. Pain Management: Tylenol as needed  -No complaints of pain, headaches. 3-14: Complaining of severe right-sided headaches, 10 out of 10,  not responsive to Tylenol.  Has documented history of allergy to Topamax and with elevated LFTs, will not schedule max dose Tylenol.  Switched to Fioricet 1-2 tabs every 6 hours as needed. -07/12/23 HA this morning, but not given fioricet yet. Asked nurse to give this. No other reports afterwards from nursing; monitor 3-18: Headaches poorly controlled with Fioricet.  Have documented allergy to Topamax, however patient and family are not aware what this is, no description comments.  Will start Topamax 25 mg twice daily, with p.o. and IV Benadryl as needed in case of allergy reaction. 3-19: No reported headaches with Topamax.  Switch Fioricet to Tylenol.  with family concern for worsening cognition, will monitor overall attention with this and DC Topamax if necessary.  3-20: DC Topamax due to worsening confusion.--Has Tylenol as needed for headaches  3-24: Intermittent Fioricet over the weekend for headaches, with benefit.  Likely to further confused due to barbital, continue Tylenol  3-26: Pain well-controlled--no further headaches or apparent phootophobia  4. Mood/Behavior/Sleep: Trazodone 75 mg nightly.  Provide emotional support             -antipsychotic agents: Seroquel nightly as needed  -3-13: Add as needed melatonin. Add sleep log 3/14: Sleeping well overnight--did have documented use of Seroquel, no events documented overnight, spotted well.  Continue current medications. 3-18: Intermittently agitated overnight, per family get Xanax with dinner at baseline.  Will add back Xanax 0.25 mg as as needed for anxiety.  Continue sleep log. 3-19: Use Xanax and melatonin overnight; sleep improved, remains emotionally labile/anxious.  Agree with therapy assessment that this is likely to only improve when she is back in a home environment 3-20: DC trazodone due to possible contributor to SIADH; continue with nightly melatonin and as needed Xanax (home medication) 3-24: Slept approximately 4 hours per log.   Melatonin taken from scheduled to as needed yesterday.  Monitor 1 day, if no further sleep would schedule again. 3-26: Resumed nightly trazodone 75 mg; repeat labs in a.m. 3-27: Sodium back down, remove trazodone.  Worsening anxiety, significantly limiting for therapies, discussed with family and agreeable to adding on  BuSpar 5 mg twice daily, increasing Xanax to 0.25 mg twice daily as needed.  Would be cautious to add additional sedating medications. 3-28: No use of as needed Xanax.  Did use melatonin for sleep yesterday. Increasing anxiety and daytime lethargy may be from withdrawal/poor sleep; resume standing at bedtime today. DC buspar due to increased lethargy.   5. Neuropsych/cognition: This patient is not capable of making decisions on her own behalf. -3-14: Subarachnoid hemorrhage 3-8, should be finishing 7 days of Keppra today.  Hopefully will improve once Keppra is off.   3-18: Per family, it is approximately at cognitive baseline; ongoing left hemineglect is worse 3-19: Hemineglect appears improved, however family concerned that still not back to where she was before rehab admission. - Continue TeleSitter for now 3-20: Worsening hemineglect today.  No other acute neurologic signs, vitally stable.  Will get repeat CT head given worsening headaches and deficits.  Treat hyponatremia as below. 3-21: CT head as above with worsening SDH, mass effect from 10 to 13 mm.  Discussed with Dr. Maisie Fus, neurosurgery.  Treating UTI and SIADH as below; seems to be improving her cognition. 3-24: Will reach out to Dr. Maisie Fus this week about when to repeat CT head--see above  6. Skin/Wound Care: Routine skin checks, should be ready for staple removal on 3/17   - 3/18: Order for staple removal placed  7. Fluids/Electrolytes/Nutrition: Routine in and outs with follow-up chemistries, continue vitamins and supplements  -07/12/23 BMP yesterday basically WNL except Ca 10.4; monitor routine labs 3-17: Mild  hyponatremia as below.  P.o. intakes are stable, albumin stable at 2.8. -Hyponatremia as below.  P.o. intakes do appear adequate, 75% of meals. 3/24: K 40 meq today for repletion 3-25: Potassium 3.2 today; add 20 mEq twice daily for 2 days, then recheck 3-27: Potassium repleted to 4.7  8.  Seizure prophylaxis.  Completed 7-day course of Keppra.   9.  History of CVA.  Plavix currently on hold due to SDH.   10.  Hyperlipidemia.  Continue Zocor 20mg  daily   11.  Hypertension.  Restart Norvasc 2.5mg  daily.    - monitor with therapies--tolerating well  -07/12/23 BPs good, monitor  3-17: Some intermittent hypertension overnight, discussed increase of Norvasc to 5 mg daily--family concerned that this this was a contributor to her fall, so we will switch from this to losartan 25 mg daily  3-19: More hypertensive overnight, normotensive this a.m.  Just started losartan today, so will monitor for effect.  May need to switch BP medications overnight.  3-20: BP improved today, but with worsening hyponatremia, will DC ACE inhibitor.  Monitor overnight and may resume amlodipine 2.5 mg in a.m.  3-21: Diastolic remains soft, continue IV fluids and holding amlodipine.   -07/18/23 BPs improving, monitor with current holding of meds -07/19/23 BPs a little up, but don't think we need to adjust meds further today 3-24: BPs remain a little high, resume amlodipine 2.5 mg daily--improved Vitals:   07/21/23 0552 07/21/23 1322 07/21/23 2044 07/22/23 0606  BP: (!) 156/79 132/66 138/63 (!) 154/60   07/22/23 1429 07/22/23 2029 07/23/23 0545 07/23/23 0854  BP: 128/77 137/79 (!) 148/70 130/71   07/23/23 1312 07/23/23 1353 07/23/23 1932 07/24/23 0630  BP: (!) 139/102 (!) 150/76 115/64 (!) 160/82     12.  Hyponatremia/SIADH.  continue sodium chloride tablets.  Follow-up chemistries - resolved on admission labs; reduce chloride tabs form TID to 1 g BID; recheck Saturday for further titration -07/12/23 labs yesterday with  appropriate NaCl levels, continue dose for now . 3/ 17: Mild hyponatremia 133 this a.m.; placed 1200 cc fluid restriction and continue salt tabs.  Repeat in 2 to 3 days 3/19: NA now down to 130; increase salt tabs to 3 times daily 3-20: NA down to 127.  Increase salt tabs to 2 tabs twice daily.  DC Topamax, trazodone, and losartan.  Urine sodium, osmolality ordered.  Chest x-ray negative.  Start IV fluids normal saline at 75 cc/h and repeat BMP every 12 hours 3-21: NA down to 124 overnight after 3 hours of IV fluid and increasing salt tabs; hospitalist did, appreciate recommendations and care.  Urine studies back, consistent with SIADH.  Without further adjustment, sodium improved to 130 this a.m.  Repeat BMP this afternoon and daily over the weekend to ensure stability, hospitalist to monitor over the weekend -07/19/23 Na 130 today, hospitalist following, appreciate their assistance, continue reduced dose salt tab 1g daily 3/24: Na 132 on NA 1 G PO daily only and fluid restriction; appreciate hospitalist recs 3-25: NA stable 133; hospitalist likely to sign off, DC IV.  Can likely move labs to every other day 3-27: Sodium down to 128.  DC trazodone as above, however likely due to expanding SDH.  See management as above.  Increase sodium tabs to 1 g twice daily, repeat BMP tonight 3-28: Sodium uptrending to 131.  Other labs look stable.  Okay to discharge on current 1 tab twice daily sodium dosing--will recheck tonight and in AM to ensure no further significant drop     Latest Ref Rng & Units 07/24/2023    7:03 AM 07/23/2023    6:03 PM 07/23/2023    6:16 AM  BMP  Glucose 70 - 99 mg/dL 119  147  99   BUN 8 - 23 mg/dL 14  14  13    Creatinine 0.44 - 1.00 mg/dL 8.29  5.62  1.30   Sodium 135 - 145 mmol/L 131  130  128   Potassium 3.5 - 5.1 mmol/L 3.9  4.2  4.7   Chloride 98 - 111 mmol/L 95  94  93   CO2 22 - 32 mmol/L 28  25  24    Calcium 8.9 - 10.3 mg/dL 86.5  78.4  69.6     13.  History of  membranous glomerulonephritis.  Patient not on immunosuppression followed by Dr. Clelia Schaumann   - BUN and CR stable  14. Transaminitis.   - reduce PRN tylenol dose to 325 mg   - recheck Monday-- essentially resolved 3@4   15. GERD: continue protonix 40mg  daily  16. Constipation: -07/12/23 no BM in 3-4 days, will increase colace to BID, monitor, if no BM by tomorrow, may need sorbitol or some other stimulant laxative.  3-17: No recorded bowel movement, add Senokot S1 tablet twice daily and give as needed MiraLAX today 3-24 last bowel movement--if none today, may need to add as needed sorbitol 3-27: Give as needed sorbitol tonight if no bowel movement today; nursing informed 3-27 last bowel movement, normal, add daily MiraLAX  17. UTI/urinary urgency/incontinence   - 3-18 urinalysis significant for infection; culture not ordered with urinalysis so added on today   - Start Keflex 500 mg twice daily for 5 days; follow cultures sensitivity  3/19: Continence seems to be improving somewhat, now mixed.  Cultures pending  3-20: Cultures NGTD.  DC Keflex.  3-21: Cultures reincubated and yesterday afternoon grew Pseudomonas and Enterococcus.     -07/19/23 per hospitalist, currently on  amox and IV cefepime, following cx  3-24: Continence improving with treatment.  3-27: Documented as incontinent again today, likely due to increased SDH and SIADH, worsening cognition.  Given intermittent fevers, will recheck urine.   3-28: Urinalysis improved.  No current symptoms other than urgency, which is more related to her anxiety.  Doreatha Martin out current treatment  18. FUO. On Tx for UTI as above. No s/s other infection, WBCs stable. Likely due to worsening SDH.   3-28: Urinalysis improved.  Will get x-ray today to evaluate for possible pneumonia, low index of suspicion, still likely d/t expanding SDH.  Scheduled Tylenol 1000 mg 3 times daily.  LOS: 16 days A FACE TO FACE EVALUATION WAS PERFORMED  Angelina Sheriff 07/24/2023, 10:01 AM

## 2023-07-24 NOTE — Progress Notes (Signed)
 Speech Language Pathology Discharge Summary  Patient Details  Name: Peggy Ramirez MRN: [id number] Date of Birth: 09-Sep-1933  Date of Discharge from SLP service:July 24, 2023  Today's Date: 07/24/2023 SLP Individual Time: 1330-1430 SLP Individual Time Calculation (min): 60 min   Skilled Therapeutic Interventions:  Pt and her son were greeted at bedside. She was awake in her wheelchair upon SLP arrival. She was pleasant and cooperative throughout functional tx tasks targeting cognition. Throughout tasks, she benefited from frequent cognitive rest breaks given evident fatigue. Once provided w/ a calendar, she required only modA visual/verbal cues to ID and verbalize orientation information. She required maxA cues for attention, problem solving, turn taking, and organization during card task comparing values. She also completed a concrete generative naming task (5 items), requiring maxA cues for attention and verbal organization. Given fatigue and attention deficits, she also benefited from frequent repetitions of directions. At the end of tx tasks, she was left in her chair with the alarm set and call light within reach. [name], [name], present upon SLP departure as well.     Patient has met 4 of 4 long term goals.  Patient to discharge at overall Max level.  Reasons goals not met: n/a - all goals met   Clinical Impression/Discharge Summary:  Despite meeting all of her goals, minimal to no progress was made d/t baseline cognitive deficits, severity of cognitive deficits, medical complications, and no carryover of information. Additionally, baseline anxiety and perseveration negatively impacted overall success as well. She remains at a maxA level for all domains of cognition. Pt/family education complete and she is set to d/c home with 24/7 supervision, as recommended. She would likely benefit from some HH ST to implement any environmental memory aids/modifications needed and provide additional  family training in the home environment.    Care Partner:  Caregiver Able to Provide Assistance: Yes  Type of Caregiver Assistance: Cognitive;Physical  Recommendation:  Home Health SLP;24 hour supervision/assistance  Rationale for SLP Follow Up: Maximize cognitive function and independence;Reduce caregiver burden   Equipment: n/a   Reasons for discharge: Discharged from hospital   Patient/Family Agrees with Progress Made and Goals Achieved: Yes    [name] 07/24/2023, 3:11 PM

## 2023-07-24 NOTE — Progress Notes (Signed)
 Occupational Therapy Discharge Summary  Patient Details  Name: Peggy Ramirez MRN: [id number] Date of Birth: 1933/05/09  Date of Discharge from OT service:July 24, 2023   Patient has met 5 of 13 long term goals due to improved activity tolerance, improved balance, and postural control.  Patient to discharge at [facility] - CGA level.  Patient's care partner is independent to provide the necessary physical and cognitive assistance at discharge.   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 At her best she is supervision with ADLs and transfers, but often more like CGA-min A depending on fatigue, anxiety levels, and confusion. Family has completed training and is ready to take her home at current level.   Recommendation:  Patient will benefit from ongoing skilled OT services in outpatient setting to continue to advance functional skills in the area of BADL and iADL.  Equipment: No equipment provided  Reasons for discharge: lack of progress toward goals and discharge from hospital  Patient/family agrees with progress made and goals achieved: Yes  OT Discharge Precautions/Restrictions  Precautions Precautions: Fall Restrictions Weight Bearing Restrictions Per Provider Order: No ADL ADL Eating: Supervision/safety Where Assessed-Eating: Edge of bed Grooming: Supervision/safety Where Assessed-Grooming: Sitting at sink Upper Body Bathing: Supervision/safety Where Assessed-Upper Body Bathing: Shower Lower Body Bathing: Contact guard Where Assessed-Lower Body Bathing: Shower Upper Body Dressing: Minimal assistance Where Assessed-Upper Body Dressing: Edge of bed Lower Body Dressing: Minimal assistance Where Assessed-Lower Body Dressing: Edge of bed Toileting: Minimal assistance Where Assessed-Toileting: Teacher, adult education: Curator Method: Event organiser: Tour manager Method: Stand pivot Vision Baseline Vision/History: 1 Wears glasses Patient Visual Report: No change from baseline Vision Assessment?: No apparent visual deficits Perception  Perception: Impaired Praxis Praxis: Impaired Praxis Impairment Details: Motor planning;Perseveration;Limb apraxia Cognition Cognition Overall Cognitive Status: Impaired/Different from baseline Arousal/Alertness: Awake/alert Orientation Level: Person;Situation;Place Person: Oriented Place: Oriented Situation: Oriented Memory: Impaired Memory Impairment: Decreased recall of new information;Decreased short term memory;Retrieval deficit;Storage deficit Decreased Short Term Memory: Verbal basic;Functional basic Attention: Sustained Sustained Attention: Appears intact Sustained Attention Impairment: Verbal basic;Functional basic Awareness: Impaired Awareness Impairment: Intellectual impairment Problem Solving: Impaired Problem Solving Impairment: Functional basic;Verbal basic Executive Function: Sequencing;Reasoning;Self Monitoring Reasoning: Impaired Reasoning Impairment: Verbal basic;Functional basic Sequencing: Impaired Sequencing Impairment: Verbal basic;Functional basic Self Monitoring: Impaired Self Monitoring Impairment: Verbal basic;Functional basic Behaviors: Impulsive;Restless;Perseveration Safety/Judgment: Impaired [employer] Scales of Cognitive Functioning: Confused, Appropriate: Moderate Assistance Brief Interview for Mental Status (BIMS) Repetition of Three Words (First Attempt): 3 Temporal Orientation: Year: Correct Temporal Orientation: Month: Accurate within 5 days Temporal Orientation: Day: Incorrect Recall: "Sock": No, could not recall Recall: "Blue": No, could not recall Recall: "Bed": No, could not recall BIMS Summary Score: 8 Sensation Sensation Light Touch: Appears Intact Hot/Cold: Appears Intact Proprioception: Impaired  Detail Proprioception Impaired Details: Impaired LUE;Impaired LLE Coordination Gross Motor Movements are Fluid and Coordinated: No Fine Motor Movements are Fluid and Coordinated: No Coordination and Movement Description: Generalized weakness, L hemi Motor  Motor Motor: Hemiplegia Mobility  Bed Mobility Bed Mobility: Sit to Supine;Supine to Sit Supine to Sit: Minimal Assistance - Patient > 75% Sit to Supine: Minimal Assistance - Patient > 75% Transfers Sit to Stand: Contact Guard/Touching assist Stand to Sit: Contact Guard/Touching assist  Trunk/Postural Assessment  Cervical Assessment Cervical Assessment: Exceptions to [facility] (forward head) Thoracic Assessment Thoracic Assessment: Exceptions to [facility] (rounded shoulders) Lumbar Assessment Lumbar Assessment: Exceptions to [facility] (posterior pelvic tilt) Postural  Control Postural Control: Deficits on evaluation Righting Reactions: delayed Protective Responses: delayed  Balance Balance Balance Assessed: Yes Static Sitting Balance Static Sitting - Balance Support: No upper extremity supported Static Sitting - Level of Assistance: 5: Stand by assistance Dynamic Sitting Balance Dynamic Sitting - Balance Support: Feet supported Dynamic Sitting - Level of Assistance: 5: Stand by assistance Static Standing Balance Static Standing - Balance Support: During functional activity Static Standing - Level of Assistance: 5: Stand by assistance Dynamic Standing Balance Dynamic Standing - Balance Support: During functional activity Dynamic Standing - Level of Assistance: 5: Stand by assistance (CGA) Extremity/Trunk Assessment RUE Assessment RUE Assessment: Within Functional Limits LUE Assessment LUE Assessment: Exceptions to [facility] LUE Body System: Neuro Brunstrum levels for arm and hand: Arm;Hand Brunstrum level for arm: Stage IV Movement is deviating from synergy;Stage V Relative Independence from Synergy Brunstrum level for hand: Stage VI  Isolated joint movements   Peggy Ramirez 07/24/2023, 12:49 PM

## 2023-07-24 NOTE — Progress Notes (Signed)
 Inpatient Rehabilitation Discharge Medication Review by a Pharmacist  A complete drug regimen review was completed for this patient to identify any potential clinically significant medication issues.  High Risk Drug Classes Is patient taking? Indication by Medication  Antipsychotic No   Anticoagulant No   Antibiotic No   Opioid No   Antiplatelet No   Hypoglycemics/insulin No   Vasoactive Medication Yes Amlodipine - HTN  Chemotherapy No   Other Yes Acetaminophen - pain management  Acidophilus- probiotic Alprazolam - anxiety Buspar- mood stabilizer Loratadine- seasonal allergies Melatonin - sleep Miralax, docusate- constipation Multivitamin, vitamin C- supplements Refresh eye drops- eye dryness Simvastatin - HLD Sodium chloride-salt supplement     Type of Medication Issue Identified Description of Issue Recommendation(s)  Drug Interaction(s) (clinically significant)     Duplicate Therapy     Allergy     No Medication Administration End Date     Incorrect Dose     Additional Drug Therapy Needed     Significant med changes from prior encounter (inform family/care partners about these prior to discharge). PTA medications:  Plavix discontinued on [facility] acute admission/discharge due to [facility].  Protonix discontinued.   Quetiapine started on [facility] acute admit was discontinued.   Communicate to patient /family/ caregiver prior to discharge. It is noted to stop taking in the discharge AVS.    Other       Clinically significant medication issues were identified that warrant physician communication and completion of prescribed/recommended actions by midnight of the next day:  No  Name of provider notified for urgent issues identified:   Provider Method of Notification:    Pharmacist comments:     Time spent performing this drug regimen review (minutes):  30 minutes     Thank you for allowing pharmacy to be part of this patients care team. Noah Delaine, RPh Clinical  Pharmacist 07/24/2023 2:50 PM

## 2023-07-25 ENCOUNTER — Other Ambulatory Visit: Payer: Self-pay | Admitting: Cardiology

## 2023-07-25 ENCOUNTER — Other Ambulatory Visit (HOSPITAL_COMMUNITY): Payer: Self-pay

## 2023-07-25 LAB — CBC WITH DIFFERENTIAL/PLATELET
Abs Immature Granulocytes: 0.01 10*3/uL (ref 0.00–0.07)
Basophils Absolute: 0.1 10*3/uL (ref 0.0–0.1)
Basophils Relative: 2 %
Eosinophils Absolute: 0.1 10*3/uL (ref 0.0–0.5)
Eosinophils Relative: 1 %
HCT: 36.1 % (ref 36.0–46.0)
Hemoglobin: 12.3 g/dL (ref 12.0–15.0)
Immature Granulocytes: 0 %
Lymphocytes Relative: 12 %
Lymphs Abs: 0.6 10*3/uL — ABNORMAL LOW (ref 0.7–4.0)
MCH: 31.3 pg (ref 26.0–34.0)
MCHC: 34.1 g/dL (ref 30.0–36.0)
MCV: 91.9 fL (ref 80.0–100.0)
Monocytes Absolute: 0.7 10*3/uL (ref 0.1–1.0)
Monocytes Relative: 15 %
Neutro Abs: 3.2 10*3/uL (ref 1.7–7.7)
Neutrophils Relative %: 70 %
Platelets: 374 10*3/uL (ref 150–400)
RBC: 3.93 MIL/uL (ref 3.87–5.11)
RDW: 12 % (ref 11.5–15.5)
WBC: 4.6 10*3/uL (ref 4.0–10.5)
nRBC: 0 % (ref 0.0–0.2)

## 2023-07-25 LAB — BASIC METABOLIC PANEL WITH GFR
Anion gap: 10 (ref 5–15)
BUN: 13 mg/dL (ref 8–23)
CO2: 25 mmol/L (ref 22–32)
Calcium: 10.5 mg/dL — ABNORMAL HIGH (ref 8.9–10.3)
Chloride: 98 mmol/L (ref 98–111)
Creatinine, Ser: 0.66 mg/dL (ref 0.44–1.00)
GFR, Estimated: >60 mL/min (ref >60)
Glucose, Bld: 93 mg/dL (ref 70–99)
Potassium: 3.8 mmol/L (ref 3.5–5.1)
Sodium: 133 mmol/L — ABNORMAL LOW (ref 135–145)

## 2023-07-25 NOTE — Progress Notes (Signed)
 PROGRESS NOTE   Subjective/Complaints:  Pt doing alright today, perseverates on getting her meds and eye drops. Says she slept ok. Pain in R head indicated when asked, but doesn't really state she's having pain, just points to her R head at times. When asked if her pain is manageable, she agrees. LBM this morning. Urinating fine. Denies any other complaints or concerns, ready to go home but again perseverates on wanting to get her meds. No further fevers/elevated temps overnight.   ROS: Denies fevers, chills, CP, SOB, diarrhea.   + Headache -severe, right-sided--intermittent + Confusion--intermittent, ongoing + anxiety--significantly worsened since last Friday; ongoing  Objective:   DG Chest 1 View Result Date: 07/24/2023 CLINICAL DATA:  Fever of unknown origin. EXAM: CHEST  1 VIEW COMPARISON:  Chest radiograph 07/16/2023 FINDINGS: The cardiomediastinal contours are normal. Aortic atherosclerosis. Pulmonary vasculature is normal. Question trace bilateral pleural effusions. Minimal ill-defined opacity at the left lung base, new from prior. No pneumothorax. No acute osseous abnormalities are seen. IMPRESSION: 1. Minimal ill-defined opacity at the left lung base, new from prior, may represent atelectasis or pneumonia. 2. Question trace bilateral pleural effusions. Electronically Signed   By: Narda Rutherford M.D.   On: 07/24/2023 15:43      Recent Labs    07/23/23 0616 07/25/23 0554  WBC 6.5 4.6  HGB 12.3 12.3  HCT 35.3* 36.1  PLT 406* 374     Recent Labs    07/24/23 1630 07/25/23 0554  NA 137 133*  K 3.7 3.8  CL 106 98  CO2 23 25  GLUCOSE 184* 93  BUN 10 13  CREATININE 1.13* 0.66  CALCIUM 8.1* 10.5*    Intake/Output Summary (Last 24 hours) at 07/25/2023 0956 Last data filed at 07/25/2023 0731 Gross per 24 hour  Intake 350 ml  Output --  Net 350 ml         Physical Exam: Vital Signs Blood pressure (!)  155/78, pulse 64, temperature 98.4 F (36.9 C), resp. rate 18, height 4\' 11"  (1.499 m), weight 43.8 kg, SpO2 96%.  General: Laying in bed. Appropriate appearance. No acute distress.  Heart: Regular rate and rhythm ; mild systolic murmur.  Lungs: Clear to auscultation, breathing unlabored, no rales or wheezes. No rhonchi or abnormal breath sounds audible.  Abdomen: Positive bowel sounds, soft nontender to palpation, nondistended Extremities:  no edema or deformity. IV in L wrist Skin: C/D/I. No apparent lesions.   Psych: anxious--unchanged   MSK:      No apparent deformity.  Full passive range of motion all 4 extremities.  Prefers to keep left upper extremity in flexor position unless stimulated-  PRIOR EXAMS: Neurologic exam:  Cognition: AAO to person, place and day with cues, month and year without cues + Moderate memory deficits--ongoing + Moderate left hemineglect--better attention to LUE today  Insight:  poor insight into current condition.  Motor: Antigravity against resistance in all 4 extremities, left upper extremity 4 out of 5 mostly limited by hemineglect, left lower extremity 5- out of 5  + Left upper extremity ataxia--ongoing, mild   Unchanged 3-28  Assessment/Plan: 1. Functional deficits which require 3+ hours per day of interdisciplinary therapy in  a comprehensive inpatient rehab setting. Physiatrist is providing close team supervision and 24 hour management of active medical problems listed below. Physiatrist and rehab team continue to assess barriers to discharge/monitor patient progress toward functional and medical goals  Care Tool:  Bathing    Body parts bathed by patient: Right arm, Left arm, Left upper leg, Right lower leg, Chest, Abdomen, Front perineal area, Buttocks, Right upper leg, Left lower leg, Face         Bathing assist Assist Level: Contact Guard/Touching assist     Upper Body Dressing/Undressing Upper body dressing   What is the patient  wearing?: Bra, Pull over shirt    Upper body assist Assist Level: Minimal Assistance - Patient > 75%    Lower Body Dressing/Undressing Lower body dressing      What is the patient wearing?: Pants, Incontinence brief     Lower body assist Assist for lower body dressing: Minimal Assistance - Patient > 75%     Toileting Toileting    Toileting assist Assist for toileting: Minimal Assistance - Patient > 75%     Transfers Chair/bed transfer  Transfers assist  Chair/bed transfer activity did not occur: Safety/medical concerns  Chair/bed transfer assist level: Contact Guard/Touching assist     Locomotion Ambulation   Ambulation assist      Assist level: Minimal Assistance - Patient > 75% Assistive device: Walker-rolling Max distance: 150'   Walk 10 feet activity   Assist     Assist level: Minimal Assistance - Patient > 75% Assistive device: Walker-rolling   Walk 50 feet activity   Assist    Assist level: Minimal Assistance - Patient > 75% Assistive device: Walker-rolling    Walk 150 feet activity   Assist    Assist level: Minimal Assistance - Patient > 75% Assistive device: Walker-rolling    Walk 10 feet on uneven surface  activity   Assist     Assist level: Minimal Assistance - Patient > 75% Assistive device: Development worker, international aid     Assist Is the patient using a wheelchair?: No Type of Wheelchair: Manual    Wheelchair assist level: Dependent - Patient 0% Max wheelchair distance: 150    Wheelchair 50 feet with 2 turns activity    Assist        Assist Level: Dependent - Patient 0%   Wheelchair 150 feet activity     Assist      Assist Level: Dependent - Patient 0%   Blood pressure (!) 155/78, pulse 64, temperature 98.4 F (36.9 C), resp. rate 18, height 4\' 11"  (1.499 m), weight 43.8 kg, SpO2 96%. Medical Problem List and Plan: 1. Functional deficits secondary to traumatic SDH with scalp laceration  stapled in the ED as well as history Sjorgens syndrome/SLE             -patient may shower             -ELOS/Goals: 10-14 days S- DC held due to increased SDH and hyponatremia as below, tentatively 3-29             -Stable to continue CIR  3-19: Urine culture pending, hyponatremia worsened this a.m., nightly hypertension remains with transition from amlodipine to ACE inhibitor.  Per family request, we will extend length of stay through Saturday, with expectation that left-sided hemineglect and cognition may not fully improve in that time.  3-20: CT head with increasing SDH from 10.5 mm to 13 mm, with increased mass effect on the right lateral  ventricle and partial sulci effacement.  Discussed with Dr. Maisie Fus, do not feel urgent intervention is warranted, recommended repeat CT head in 1 week to see if bleed has liquefied and possible burr hole evacuation at that time.  Monitor closely for status changes--clinically improved 3/21-23  3/25: Supervision UBD, CGA LBD, complicated by learned helplessness. Max A for SLP cog. Complicated by very poor sleep over last 2-3 days per nursing.    -3-26 Per Dr. Maisie Fus, repeat CT head, depending on results we will follow-up outpatient versus schedule bur hole procedure--results do show continued subacute expansion, he is discussing with Dr. Conchita Paris MMA embolization--plan for outpatient follow-up in Dr. Val Riles office early next week and outpatient procedure planning.  Discussed with family, offered extended LOS, son believes they would be able to take care of patient at home given her current level of function in the interim.  3/28: The patient is anticipated medically ready for discharge 3/29 to home and will need follow-up with [facility]. In addition, they will need to follow up with their PCP, Neurosurgery - Has appt scheduled with Dr. Conchita Paris Monday afternoon to discuss MMA.  -07/25/23 d/c today, appears stable for d/c, reviewed discharge instructions and wrote a  note on the top regarding med dosing times-- nurse to also review with family once more.   2.  Antithrombotics: -DVT/anticoagulation:  Mechanical: Antiembolism stockings, thigh (TED hose) Bilateral lower extremities             -antiplatelet therapy: N/A  3. Pain Management: Tylenol as needed  -No complaints of pain, headaches. 3-14: Complaining of severe right-sided headaches, 10 out of 10, not responsive to Tylenol.  Has documented history of allergy to Topamax and with elevated LFTs, will not schedule max dose Tylenol.  Switched to Fioricet 1-2 tabs every 6 hours as needed. -07/12/23 HA this morning, but not given fioricet yet. Asked nurse to give this. No other reports afterwards from nursing; monitor 3-18: Headaches poorly controlled with Fioricet.  Have documented allergy to Topamax, however patient and family are not aware what this is, no description comments.  Will start Topamax 25 mg twice daily, with p.o. and IV Benadryl as needed in case of allergy reaction. 3-19: No reported headaches with Topamax.  Switch Fioricet to Tylenol.  with family concern for worsening cognition, will monitor overall attention with this and DC Topamax if necessary.  3-20: DC Topamax due to worsening confusion.--Has Tylenol as needed for headaches  3-24: Intermittent Fioricet over the weekend for headaches, with benefit.  Likely to further confused due to barbital, continue Tylenol  3-26: Pain well-controlled--no further headaches or apparent phootophobia  4. Mood/Behavior/Sleep: Trazodone 75 mg nightly.  Provide emotional support             -antipsychotic agents: Seroquel nightly as needed  -3-13: Add as needed melatonin. Add sleep log 3/14: Sleeping well overnight--did have documented use of Seroquel, no events documented overnight, spotted well.  Continue current medications. 3-18: Intermittently agitated overnight, per family get Xanax with dinner at baseline.  Will add back Xanax 0.25 mg as as needed for  anxiety.  Continue sleep log. 3-19: Use Xanax and melatonin overnight; sleep improved, remains emotionally labile/anxious.  Agree with therapy assessment that this is likely to only improve when she is back in a home environment 3-20: DC trazodone due to possible contributor to SIADH; continue with nightly melatonin and as needed Xanax (home medication) 3-24: Slept approximately 4 hours per log.  Melatonin taken from scheduled to as  needed yesterday.  Monitor 1 day, if no further sleep would schedule again. 3-26: Resumed nightly trazodone 75 mg; repeat labs in a.m. 3-27: Sodium back down, remove trazodone.  Worsening anxiety, significantly limiting for therapies, discussed with family and agreeable to adding on BuSpar 5 mg twice daily, increasing Xanax to 0.25 mg twice daily as needed.  Would be cautious to add additional sedating medications. 3-28: No use of as needed Xanax.  Did use melatonin for sleep yesterday. Increasing anxiety and daytime lethargy may be from withdrawal/poor sleep; resume standing at bedtime today. DC buspar due to increased lethargy.   5. Neuropsych/cognition: This patient is not capable of making decisions on her own behalf. -3-14: Subarachnoid hemorrhage 3-8, should be finishing 7 days of Keppra today.  Hopefully will improve once Keppra is off.   3-18: Per family, it is approximately at cognitive baseline; ongoing left hemineglect is worse 3-19: Hemineglect appears improved, however family concerned that still not back to where she was before rehab admission. - Continue TeleSitter for now 3-20: Worsening hemineglect today.  No other acute neurologic signs, vitally stable.  Will get repeat CT head given worsening headaches and deficits.  Treat hyponatremia as below. 3-21: CT head as above with worsening SDH, mass effect from 10 to 13 mm.  Discussed with Dr. Maisie Fus, neurosurgery.  Treating UTI and SIADH as below; seems to be improving her cognition. 3-24: Will reach out to  Dr. Maisie Fus this week about when to repeat CT head--see above  6. Skin/Wound Care: Routine skin checks, should be ready for staple removal on 3/17   - 3/18: Order for staple removal placed  7. Fluids/Electrolytes/Nutrition: Routine in and outs with follow-up chemistries, continue vitamins and supplements  -07/12/23 BMP yesterday basically WNL except Ca 10.4; monitor routine labs 3-17: Mild hyponatremia as below.  P.o. intakes are stable, albumin stable at 2.8. -Hyponatremia as below.  P.o. intakes do appear adequate, 75% of meals. 3/24: K 40 meq today for repletion 3-25: Potassium 3.2 today; add 20 mEq twice daily for 2 days, then recheck 3-27: Potassium repleted to 4.7 -07/25/23 BMP this morning looking great, very mild stable hypoNa as below  8.  Seizure prophylaxis.  Completed 7-day course of Keppra.   9.  History of CVA.  Plavix currently on hold due to SDH.   10.  Hyperlipidemia.  Continue Zocor 20mg  daily   11.  Hypertension.  Restart Norvasc 2.5mg  daily.    - monitor with therapies--tolerating well  -07/12/23 BPs good, monitor  3-17: Some intermittent hypertension overnight, discussed increase of Norvasc to 5 mg daily--family concerned that this this was a contributor to her fall, so we will switch from this to losartan 25 mg daily  3-19: More hypertensive overnight, normotensive this a.m.  Just started losartan today, so will monitor for effect.  May need to switch BP medications overnight.  3-20: BP improved today, but with worsening hyponatremia, will DC ACE inhibitor.  Monitor overnight and may resume amlodipine 2.5 mg in a.m.  3-21: Diastolic remains soft, continue IV fluids and holding amlodipine.   -07/18/23 BPs improving, monitor with current holding of meds -07/19/23 BPs a little up, but don't think we need to adjust meds further today 3-24: BPs remain a little high, resume amlodipine 2.5 mg daily--improved -07/25/23 BPs mostly ok, occasional elevations but overall stable, cont  meds as is and f/up outpatient Vitals:   07/22/23 0606 07/22/23 1429 07/22/23 2029 07/23/23 0545  BP: (!) 154/60 128/77 137/79 (!) 148/70  07/23/23 0854 07/23/23 1312 07/23/23 1353 07/23/23 1932  BP: 130/71 (!) 139/102 (!) 150/76 115/64   07/24/23 0630 07/24/23 1334 07/24/23 1958 07/25/23 0413  BP: (!) 160/82 (!) 112/58 (!) 126/99 (!) 155/78     12.  Hyponatremia/SIADH.  continue sodium chloride tablets.  Follow-up chemistries - resolved on admission labs; reduce chloride tabs form TID to 1 g BID; recheck Saturday for further titration -07/12/23 labs yesterday with appropriate NaCl levels, continue dose for now . 3/ 17: Mild hyponatremia 133 this a.m.; placed 1200 cc fluid restriction and continue salt tabs.  Repeat in 2 to 3 days 3/19: NA now down to 130; increase salt tabs to 3 times daily 3-20: NA down to 127.  Increase salt tabs to 2 tabs twice daily.  DC Topamax, trazodone, and losartan.  Urine sodium, osmolality ordered.  Chest x-ray negative.  Start IV fluids normal saline at 75 cc/h and repeat BMP every 12 hours 3-21: NA down to 124 overnight after 3 hours of IV fluid and increasing salt tabs; hospitalist did, appreciate recommendations and care.  Urine studies back, consistent with SIADH.  Without further adjustment, sodium improved to 130 this a.m.  Repeat BMP this afternoon and daily over the weekend to ensure stability, hospitalist to monitor over the weekend -07/19/23 Na 130 today, hospitalist following, appreciate their assistance, continue reduced dose salt tab 1g daily 3/24: Na 132 on NA 1 G PO daily only and fluid restriction; appreciate hospitalist recs 3-25: NA stable 133; hospitalist likely to sign off, DC IV.  Can likely move labs to every other day 3-27: Sodium down to 128.  DC trazodone as above, however likely due to expanding SDH.  See management as above.  Increase sodium tabs to 1 g twice daily, repeat BMP tonight 3-28: Sodium uptrending to 131.  Other labs look stable.   Okay to discharge on current 1 tab twice daily sodium dosing--will recheck tonight and in AM to ensure no further significant drop -07/25/23 Na 133, stable, cont 1g BID dosing for Na. F/up outpatient     Latest Ref Rng & Units 07/25/2023    5:54 AM 07/24/2023    4:30 PM 07/24/2023    7:03 AM  BMP  Glucose 70 - 99 mg/dL 93  981  191   BUN 8 - 23 mg/dL 13  10  14    Creatinine 0.44 - 1.00 mg/dL 4.78  2.95  6.21   Sodium 135 - 145 mmol/L 133  137  131   Potassium 3.5 - 5.1 mmol/L 3.8  3.7  3.9   Chloride 98 - 111 mmol/L 98  106  95   CO2 22 - 32 mmol/L 25  23  28    Calcium 8.9 - 10.3 mg/dL 30.8  8.1  65.7     13.  History of membranous glomerulonephritis.  Patient not on immunosuppression followed by Dr. Clelia Schaumann   - BUN and CR stable  14. Transaminitis.   - reduce PRN tylenol dose to 325 mg   - recheck Monday-- essentially resolved 3@4   15. GERD: continue protonix 40mg  daily  16. Constipation: -07/12/23 no BM in 3-4 days, will increase colace to BID, monitor, if no BM by tomorrow, may need sorbitol or some other stimulant laxative.  3-17: No recorded bowel movement, add Senokot S1 tablet twice daily and give as needed MiraLAX today 3-24 last bowel movement--if none today, may need to add as needed sorbitol 3-27: Give as needed sorbitol tonight if no bowel movement today;  nursing informed 3-27 last bowel movement, normal, add daily MiraLAX -07/25/23 LBM today, monitor outpatient  17. UTI/urinary urgency/incontinence   - 3-18 urinalysis significant for infection; culture not ordered with urinalysis so added on today   - Start Keflex 500 mg twice daily for 5 days; follow cultures sensitivity  3/19: Continence seems to be improving somewhat, now mixed.  Cultures pending  3-20: Cultures NGTD.  DC Keflex.  3-21: Cultures reincubated and yesterday afternoon grew Pseudomonas and Enterococcus.     -07/19/23 per hospitalist, currently on amox and IV cefepime, following cx  3-24: Continence  improving with treatment.  3-27: Documented as incontinent again today, likely due to increased SDH and SIADH, worsening cognition.  Given intermittent fevers, will recheck urine.   3-28: Urinalysis improved.  No current symptoms other than urgency, which is more related to her anxiety.  Doreatha Martin out current treatment today  18. FUO. On Tx for UTI as above. No s/s other infection, WBCs stable. Likely due to worsening SDH.   3-28: Urinalysis improved.  Will get x-ray today to evaluate for possible pneumonia, low index of suspicion, still likely d/t expanding SDH.  Scheduled Tylenol 1000 mg 3 times daily.  -07/25/23 no further fevers, CXR yesterday with ?atelectasis vs PNA but clinically well appearing with no cough and pulm exam benign, doubt PNA. WBC down to 4.6 today. Doubt need for further abx at this time, pt stable for d/c.   LOS: 17 days A FACE TO FACE EVALUATION WAS PERFORMED  [address] 07/25/2023, 9:56 AM

## 2023-07-27 DIAGNOSIS — S065XAA Traumatic subdural hemorrhage with loss of consciousness status unknown, initial encounter: Secondary | ICD-10-CM | POA: Diagnosis not present

## 2023-07-28 ENCOUNTER — Other Ambulatory Visit: Payer: Self-pay | Admitting: Neurosurgery

## 2023-07-28 ENCOUNTER — Other Ambulatory Visit (HOSPITAL_COMMUNITY): Payer: Self-pay | Admitting: Neurosurgery

## 2023-07-28 DIAGNOSIS — D320 Benign neoplasm of cerebral meninges: Secondary | ICD-10-CM

## 2023-07-31 DIAGNOSIS — S065X0D Traumatic subdural hemorrhage without loss of consciousness, subsequent encounter: Secondary | ICD-10-CM | POA: Diagnosis not present

## 2023-07-31 DIAGNOSIS — M3500 Sicca syndrome, unspecified: Secondary | ICD-10-CM | POA: Diagnosis not present

## 2023-07-31 DIAGNOSIS — Z556 Problems related to health literacy: Secondary | ICD-10-CM | POA: Diagnosis not present

## 2023-07-31 DIAGNOSIS — E210 Primary hyperparathyroidism: Secondary | ICD-10-CM | POA: Diagnosis not present

## 2023-07-31 DIAGNOSIS — E7800 Pure hypercholesterolemia, unspecified: Secondary | ICD-10-CM | POA: Diagnosis not present

## 2023-07-31 DIAGNOSIS — E871 Hypo-osmolality and hyponatremia: Secondary | ICD-10-CM | POA: Diagnosis not present

## 2023-07-31 DIAGNOSIS — K219 Gastro-esophageal reflux disease without esophagitis: Secondary | ICD-10-CM | POA: Diagnosis not present

## 2023-07-31 DIAGNOSIS — K5909 Other constipation: Secondary | ICD-10-CM | POA: Diagnosis not present

## 2023-07-31 DIAGNOSIS — Z9181 History of falling: Secondary | ICD-10-CM | POA: Diagnosis not present

## 2023-07-31 DIAGNOSIS — Z8673 Personal history of transient ischemic attack (TIA), and cerebral infarction without residual deficits: Secondary | ICD-10-CM | POA: Diagnosis not present

## 2023-07-31 DIAGNOSIS — I10 Essential (primary) hypertension: Secondary | ICD-10-CM | POA: Diagnosis not present

## 2023-07-31 DIAGNOSIS — E785 Hyperlipidemia, unspecified: Secondary | ICD-10-CM | POA: Diagnosis not present

## 2023-08-04 ENCOUNTER — Other Ambulatory Visit: Payer: Self-pay | Admitting: Radiology

## 2023-08-05 ENCOUNTER — Encounter (HOSPITAL_COMMUNITY): Payer: Self-pay | Admitting: Neurosurgery

## 2023-08-05 ENCOUNTER — Other Ambulatory Visit: Payer: Self-pay

## 2023-08-05 ENCOUNTER — Encounter: Admitting: Registered Nurse

## 2023-08-05 ENCOUNTER — Encounter: Payer: Self-pay | Admitting: Registered Nurse

## 2023-08-05 VITALS — BP 132/71 | HR 72 | Ht 59.0 in

## 2023-08-05 DIAGNOSIS — I10 Essential (primary) hypertension: Secondary | ICD-10-CM | POA: Diagnosis not present

## 2023-08-05 DIAGNOSIS — Z882 Allergy status to sulfonamides status: Secondary | ICD-10-CM | POA: Diagnosis not present

## 2023-08-05 DIAGNOSIS — Z9049 Acquired absence of other specified parts of digestive tract: Secondary | ICD-10-CM | POA: Diagnosis not present

## 2023-08-05 DIAGNOSIS — Z87441 Personal history of nephrotic syndrome: Secondary | ICD-10-CM | POA: Diagnosis not present

## 2023-08-05 DIAGNOSIS — D320 Benign neoplasm of cerebral meninges: Secondary | ICD-10-CM | POA: Diagnosis not present

## 2023-08-05 DIAGNOSIS — E785 Hyperlipidemia, unspecified: Secondary | ICD-10-CM | POA: Diagnosis not present

## 2023-08-05 DIAGNOSIS — Z8673 Personal history of transient ischemic attack (TIA), and cerebral infarction without residual deficits: Secondary | ICD-10-CM | POA: Diagnosis not present

## 2023-08-05 DIAGNOSIS — Z881 Allergy status to other antibiotic agents status: Secondary | ICD-10-CM | POA: Diagnosis not present

## 2023-08-05 DIAGNOSIS — E222 Syndrome of inappropriate secretion of antidiuretic hormone: Secondary | ICD-10-CM | POA: Diagnosis not present

## 2023-08-05 DIAGNOSIS — S065X0D Traumatic subdural hemorrhage without loss of consciousness, subsequent encounter: Secondary | ICD-10-CM | POA: Diagnosis not present

## 2023-08-05 DIAGNOSIS — S065X0A Traumatic subdural hemorrhage without loss of consciousness, initial encounter: Secondary | ICD-10-CM | POA: Diagnosis not present

## 2023-08-05 DIAGNOSIS — E7849 Other hyperlipidemia: Secondary | ICD-10-CM | POA: Diagnosis not present

## 2023-08-05 DIAGNOSIS — M3500 Sicca syndrome, unspecified: Secondary | ICD-10-CM | POA: Diagnosis not present

## 2023-08-05 DIAGNOSIS — E7800 Pure hypercholesterolemia, unspecified: Secondary | ICD-10-CM | POA: Diagnosis not present

## 2023-08-05 DIAGNOSIS — Z888 Allergy status to other drugs, medicaments and biological substances status: Secondary | ICD-10-CM | POA: Diagnosis not present

## 2023-08-05 DIAGNOSIS — Z79899 Other long term (current) drug therapy: Secondary | ICD-10-CM | POA: Diagnosis not present

## 2023-08-05 DIAGNOSIS — Z862 Personal history of diseases of the blood and blood-forming organs and certain disorders involving the immune mechanism: Secondary | ICD-10-CM | POA: Diagnosis not present

## 2023-08-05 NOTE — Progress Notes (Addendum)
 PCP - Dr Fara Chute Cardiologist - Dr Dina Rich  Chest x-ray - 07/24/23 EKG - 07/06/23 Stress Test - 11/01/14 ECHO - 11/13/20 Cardiac Cath - n/a  ICD Pacemaker/Loop - n/a  Sleep Study -  n/a  Diabetes - n/a  ASA & Blood Thinner Instructions:  n/a  NPO  Anesthesia review: Yes  STOP now taking any Aspirin (unless otherwise instructed by your surgeon), Aleve, Naproxen, Ibuprofen, Motrin, Advil, Goody's, BC's, all herbal medications, fish oil, and all vitamins.   Coronavirus Screening Does the patient have any of the following symptoms:  Cough yes/no: No Fever (>100.57F)  yes/no: No Runny nose yes/no: No Sore throat yes/no: No Difficulty breathing/shortness of breath  yes/no: No  Has the patient traveled in the last 14 days and where? yes/no: No  Patient's Son Kennya Schwenn verbalized understanding of instructions that were given via phone.

## 2023-08-05 NOTE — Anesthesia Preprocedure Evaluation (Signed)
 Anesthesia Evaluation  Patient identified by MRN, date of birth, ID band Patient awake    Reviewed: Allergy & Precautions, NPO status , Patient's Chart, lab work & pertinent test results  Airway Mallampati: II  TM Distance: >3 FB Neck ROM: Full    Dental  (+) Missing, Dental Advisory Given,    Pulmonary neg pulmonary ROS   Pulmonary exam normal breath sounds clear to auscultation       Cardiovascular hypertension, Pt. on medications Normal cardiovascular exam Rhythm:Regular Rate:Normal  TTE 2022  1. Left ventricular ejection fraction, by estimation, is 60 to 65%. The  left ventricle has normal function. The left ventricle has no regional  wall motion abnormalities. There is mild left ventricular hypertrophy.  Left ventricular diastolic parameters  are indeterminate.   2. Right ventricular systolic function is normal. The right ventricular  size is normal. There is normal pulmonary artery systolic pressure. The  estimated right ventricular systolic pressure is 21.1 mmHg.   3. There is a trivial pericardial effusion that is circumferential.   4. The mitral valve is grossly normal. No evidence of mitral valve  regurgitation.   5. The aortic valve is tricuspid. There is mild calcification of the  aortic valve. Aortic valve regurgitation is trivial.   6. The inferior vena cava is normal in size with greater than 50%  respiratory variability, suggesting right atrial pressure of 3 mmHg.     Neuro/Psych  Headaches CVA  negative psych ROS   GI/Hepatic Neg liver ROS,GERD  ,,  Endo/Other  negative endocrine ROS  Sjogren's, Lupus  Renal/GU negative Renal ROS  negative genitourinary   Musculoskeletal negative musculoskeletal ROS (+)    Abdominal   Peds  Hematology negative hematology ROS (+)   Anesthesia Other Findings 89 y.o. female with enlarging chronic right SDH, largely asymptomatic  Other history includes never  smoker, HTN, hypercholesterolemia, left BBB, CVA, GERD, lupus, membranous glomerulonephritis, Sjogren's, TAH/BSO (2005), traumatic SDH/SAH (07/04/23).  Reproductive/Obstetrics                             Anesthesia Physical Anesthesia Plan  ASA: 3  Anesthesia Plan: General   Post-op Pain Management: Tylenol PO (pre-op)*   Induction: Intravenous  PONV Risk Score and Plan: 3 and Dexamethasone, Ondansetron and Treatment may vary due to age or medical condition  Airway Management Planned: Oral ETT  Additional Equipment: Arterial line  Intra-op Plan:   Post-operative Plan: Extubation in OR  Informed Consent: I have reviewed the patients History and Physical, chart, labs and discussed the procedure including the risks, benefits and alternatives for the proposed anesthesia with the patient or authorized representative who has indicated his/her understanding and acceptance.     Dental advisory given  Plan Discussed with: CRNA  Anesthesia Plan Comments: (PAT note written 08/05/2023 by Shonna Chock, PA-C.  )       Anesthesia Quick Evaluation

## 2023-08-05 NOTE — Patient Instructions (Signed)
 Call and schedule an appointment with your Primary Care Physician.   Call Dr. Hoyt Koch : For follow up  appointment .  [address]  [address]  [place], [place] [address]  [phone]   Dr. Clelia Schaumann [address]  [place] [address] [place], [place] [address]  [phone]

## 2023-08-05 NOTE — Progress Notes (Signed)
 Anesthesia Chart Review: SAME DAY WORK-UP  Case: [id number] Date/Time: 08/06/23 1415   Procedure: RADIOLOGY WITH ANESTHESIA - Right middle meningeal artery embolization   Anesthesia type: General   Diagnosis: Subdural hematoma (HCC) [S06.5XAA]   Pre-op diagnosis: subdural hematoma   [facility] / [facility]   Surgeons: Lisbeth Renshaw, MD       DISCUSSION: Patient is an 89 year old female scheduled for the above procedure. She had a mechanical fall on 07/04/23. She toppled over with her walker and hit her head.  Denied LOC.She was on Plavix. Head CT showed acute posttraumatic ICH with right SDH and scattered bilaterl SAH, right scalp hematoma, no skull fracture, no midline shift noted. No c-spine fracture. Serial CT scan that same day showed a significant increase in the size of her right SDH with increased mass effect. She was transferred to [facility] for neurosurgery consultation. Repeat imaging showed stable findings, and she was discharged to [facility] for therapies. Short term Keppra given for seizure prophylaxis. Plavix also held.  She was in CIR from 07/08/23 - 07/25/23.for PT/OT and ST.  Repeat head CT on 07/16/23 showed increases size in right SDH. Imaging on 07/22/23 showed SDH more lobulated and hypodense with maximal 14 mm thickness along the anterior frontal convexity and mass effect on the right lateral ventricle and 3 mm leftward midline shift. Neurosurgery planned outpatient follow-up to discuss consideration of bur hole versus embolization.    Other history includes never smoker, HTN, hypercholesterolemia, left BBB, CVA, GERD, lupus, membranous glomerulonephritis, Sjogren's, TAH/BSO (2005), traumatic SDH/SAH (07/04/23).  Last visit with cardiologist Dr. Vivien Rota was on 05/21/2023.  He notes she has a history of chest pain with tightness after swallowing pills and no exertional.  She had previous non-ischemic testing for similar symptoms. He did not recommended repeat testing. BP  at goal. Amlodipine dose lowered given mild LE edema. Continue statin therapy. Six month follow-up planned. She has had known prior left BBB (documented in ~ 2021/2022).  She is a same day work-up. Anesthesia team to evaluate on the day of surgery.    VS: Ht 4\' 11"  (1.499 m)   Wt 36.7 kg   BMI 16.34 kg/m  BP Readings from Last 3 Encounters:  08/05/23 132/71  07/25/23 (!) 155/78  07/08/23 (!) 144/68   Pulse Readings from Last 3 Encounters:  08/05/23 72  07/25/23 64  07/08/23 64     PROVIDERS: Sasser, Clarene Critchley, MD is PCP  Dina Rich, MD is cardiologist   LABS: For day of procedure as indicated. Last results in [facility] include: Lab Results  Component Value Date   WBC 4.6 07/25/2023   HGB 12.3 07/25/2023   HCT 36.1 07/25/2023   PLT 374 07/25/2023   GLUCOSE 93 07/25/2023   CHOL 163 10/16/2015   TRIG 156 (H) 10/16/2015   HDL 39 (L) 10/16/2015   LDLCALC 93 10/16/2015   ALT 46 (H) 07/20/2023   AST 30 07/20/2023   NA 133 (L) 07/25/2023   K 3.8 07/25/2023   CL 98 07/25/2023   CREATININE 0.66 07/25/2023   BUN 13 07/25/2023   CO2 25 07/25/2023   TSH 1.103 11/13/2020   INR 1.0 07/04/2023   HGBA1C 5.2 10/15/2015     IMAGES: 1V CXR 07/24/23: FINDINGS: The cardiomediastinal contours are normal. Aortic atherosclerosis. Pulmonary vasculature is normal. Question trace bilateral pleural effusions. Minimal ill-defined opacity at the left lung base, new from prior. No pneumothorax. No acute osseous abnormalities are seen. IMPRESSION: 1. Minimal  ill-defined opacity at the left lung base, new from prior, may represent atelectasis or pneumonia. 2. Question trace bilateral pleural effusions.  CT Head 07/22/23: IMPRESSION: 1. Right side Subdural Hematoma has become more lobulated and hypodense since 07/16/2023. Now maximal 14 mm thickness along the anterior frontal convexity. Subsequent increased mass effect on the right lateral ventricle and 3 mm leftward midline shift.  Basilar cisterns remain patent. 2. No new intracranial abnormality. No skull fracture identified.  US Thyroid 11/14/20: IMPRESSION: 1. Heterogeneous, enlarged and multinodular thyroid gland most consistent with multinodular goiter. 2. A 1.4 cm TI-RADS category 4 nodule in the right mid gland meets criteria for imaging surveillance. Recommend follow-up ultrasound in 1 year. 3. Additional bilateral thyroid nodules also noted incidentally but do not meet criteria to recommend further evaluation.  - The above is in keeping with the ACR TI-RADS recommendations - J Am Coll Radiol 2017;14:587-595.  CTA Neck 11/14/20: IMPRESSION: 1. Atherosclerotic plaque within the proximal right subclavian artery results in 65% stenosis. 2. The bilateral common carotid and internal carotid arteries are patent within the neck. Atherosclerotic plaque within the proximal right ICA results in 40% stenosis. Minimal atherosclerotic plaque within the left carotid system within the neck. 3. Vertebral arteries codominant and patent within the neck without stenosis. 4. Multiple thyroid nodules, the largest within the right lobe measuring 2 cm. A dedicated thyroid ultrasound may be obtained for further evaluation as clinically warranted (given the patient's advanced age).    EKG: EKG 07/06/23: Normal sinus rhythm with sinus arrhythmia Left axis deviation Left bundle branch block Inferior infarct Abnormal ECG When compared with ECG of 21-May-2023 15:02, Inferior infarct Present Confirmed by York Pellant [phone]) on 07/06/2023 9:40:17 PM   CV: Echo 11/13/20: IMPRESSIONS   1. Left ventricular ejection fraction, by estimation, is 60 to 65%. The  left ventricle has normal function. The left ventricle has no regional  wall motion abnormalities. There is mild left ventricular hypertrophy.  Left ventricular diastolic parameters  are indeterminate.   2. Right ventricular systolic function is normal. The right  ventricular  size is normal. There is normal pulmonary artery systolic pressure. The  estimated right ventricular systolic pressure is 21.1 mmHg.   3. There is a trivial pericardial effusion that is circumferential.   4. The mitral valve is grossly normal. No evidence of mitral valve  regurgitation.   5. The aortic valve is tricuspid. There is mild calcification of the  aortic valve. Aortic valve regurgitation is trivial.   6. The inferior vena cava is normal in size with greater than 50%  respiratory variability, suggesting right atrial pressure of 3 mmHg.    Long term monitor 11/03/20 - 11/13/20: 12 day monitor Rare supraventricular ectopy in the form of isolated PACs, couplets, triplets. Rare episodes of SVT up to 20 beats Rare ventricular ectopy in the form of isolated PVCs, couplets Symptoms correlate with sinus rhythm and rare PACs/PVCs    Nuclear stress test 11/01/14: The study is normal. This is a low risk study. Nuclear stress EF: 60%. There was no ST segment deviation noted during stress.   Past Medical History:  Diagnosis Date   Acute metabolic encephalopathy    Chronic constipation    Chronic left lower quadrant pain    GERD (gastroesophageal reflux disease)    Headache(784.0)    High cholesterol    Hydrosalpinx    LEFT   Hypertension    Lupus    Membranous glomerulonephritis    followed by Dr. Clelia Schaumann  Ovarian fibroma    RIGHT   Pelvic adhesions    Sjogren's syndrome (HCC)    Stroke [facility])     Past Surgical History:  Procedure Laterality Date   APPENDECTOMY  04/28/2000   BIOPSY  12/17/2020   Procedure: BIOPSY;  Surgeon: Lanelle Bal, DO;  [facility];  Service: Endoscopy;;   COLONOSCOPY WITH PROPOFOL N/A 12/17/2020   Procedure: COLONOSCOPY WITH PROPOFOL;  Surgeon: Lanelle Bal, DO;  [facility];  Service: Endoscopy;  Laterality: N/A;   EYE SURGERY Bilateral 2024   cataracts   FRACTURE SURGERY     HIP  PINNING,CANNULATED Left 12/15/2019   Procedure: CANNULATED HIP PINNING;  Surgeon: Roby Lofts, MD;  Location: [facility] OR;  Service: Orthopedics;  Laterality: Left;   LAPAROSCOPIC CHOLECYSTECTOMY  04/28/2000   TONSILLECTOMY  04/29/1939   TOTAL ABDOMINAL HYSTERECTOMY W/ BILATERAL SALPINGOOPHORECTOMY  04/29/2003   VAGINAL HYSTERECTOMY  04/29/1979    MEDICATIONS: No current facility-administered medications for this encounter.    acetaminophen (TYLENOL) 500 MG tablet   ALPRAZolam (XANAX) 0.25 MG tablet   amLODipine (NORVASC) 2.5 MG tablet   Ascorbic Acid (VITAMIN C) 1000 MG tablet   busPIRone (BUSPAR) 5 MG tablet   Carboxymethylcellulose Sodium (REFRESH LIQUIGEL OP)   docusate sodium (COLACE) 100 MG capsule   loratadine (CLARITIN) 10 MG tablet   melatonin 5 MG TABS   Multiple Vitamin (MULTIVITAMIN) tablet   polyethylene glycol (MIRALAX / GLYCOLAX) 17 g packet   Acidophilus Lactobacillus CAPS   simvastatin (ZOCOR) 20 MG tablet   sodium chloride 1 g tablet    Shonna Chock, PA-C Surgical Short Stay/Anesthesiology [facility] Phone [phone] [facility] Phone [phone] 08/05/2023 2:16 PM

## 2023-08-05 NOTE — Progress Notes (Deleted)
 Subjective:    Patient ID: Peggy Ramirez, female    DOB: 1933-07-26, 89 y.o.   MRN: [id number]  HPI  Pain Inventory Average Pain {NUMBERS; 0-10:5044} Pain Right Now {NUMBERS; 0-10:5044} My pain is {PAIN DESCRIPTION:21022940}  In the last 24 hours, has pain interfered with the following? General activity {NUMBERS; 0-10:5044} Relation with others {NUMBERS; 0-10:5044} Enjoyment of life {NUMBERS; 0-10:5044} What TIME of day is your pain at its worst? {time of day:24191} Sleep (in general) {BHH GOOD/FAIR/POOR:22877}  Pain is worse with: {ACTIVITIES:21022942} Pain improves with: {PAIN IMPROVES WGNF:62130865} Relief from Meds: {NUMBERS; 0-10:5044}  Family History  Problem Relation Age of Onset   Heart disease Other    Anxiety disorder Other    Depression Other    Social History   Socioeconomic History   Marital status: Widowed    Spouse name: Not on file   Number of children: 4   Years of education: 12   Highest education level: Not on file  Occupational History   Occupation: retired    Associate Professor: Occupational hygienist  Tobacco Use   Smoking status: Never   Smokeless tobacco: Never  Vaping Use   Vaping status: Never Used  Substance and Sexual Activity   Alcohol use: No    Alcohol/week: 0.0 standard drinks of alcohol   Drug use: Never   Sexual activity: Not Currently  Other Topics Concern   Not on file  Social History Narrative   Patient lives at home alone.   Caffeine Use: 1-2 cups daily   Right handed    Social Drivers of Health   Financial Resource Strain: Not on file  Food Insecurity: No Food Insecurity (07/05/2023)   Hunger Vital Sign    Worried About Running Out of Food in the Last Year: Never true    Ran Out of Food in the Last Year: Never true  Transportation Needs: No Transportation Needs (07/05/2023)   PRAPARE - Administrator, Civil Service (Medical): No    Lack of Transportation (Non-Medical): No  Physical Activity: Not on file  Stress: Not  on file  Social Connections: Moderately Integrated (07/06/2023)   Social Connection and Isolation Panel [NHANES]    Frequency of Communication with Friends and Family: Twice a week    Frequency of Social Gatherings with Friends and Family: More than three times a week    Attends Religious Services: More than 4 times per year    Active Member of [employer] or Organizations: Yes    Attends Banker Meetings: More than 4 times per year    Marital Status: Widowed   Past Surgical History:  Procedure Laterality Date   APPENDECTOMY  04/28/2000   BIOPSY  12/17/2020   Procedure: BIOPSY;  Surgeon: Lanelle Bal, DO;  [facility];  Service: Endoscopy;;   COLONOSCOPY WITH PROPOFOL N/A 12/17/2020   Procedure: COLONOSCOPY WITH PROPOFOL;  Surgeon: Lanelle Bal, DO;  [facility];  Service: Endoscopy;  Laterality: N/A;   FRACTURE SURGERY     HIP PINNING,CANNULATED Left 12/15/2019   Procedure: CANNULATED HIP PINNING;  Surgeon: Roby Lofts, MD;  Location[facility];  Service: Orthopedics;  Laterality: Left;   LAPAROSCOPIC CHOLECYSTECTOMY  04/28/2000   TONSILLECTOMY  04/29/1939   TOTAL ABDOMINAL HYSTERECTOMY W/ BILATERAL SALPINGOOPHORECTOMY  04/29/2003   VAGINAL HYSTERECTOMY  04/29/1979   Past Surgical History:  Procedure Laterality Date   APPENDECTOMY  04/28/2000   BIOPSY  12/17/2020   Procedure: BIOPSY;  Surgeon: Marletta Lor,  Hennie Duos, DO;  [facility];  Service: Endoscopy;;   COLONOSCOPY WITH PROPOFOL N/A 12/17/2020   Procedure: COLONOSCOPY WITH PROPOFOL;  Surgeon: Lanelle Bal, DO;  [facility];  Service: Endoscopy;  Laterality: N/A;   FRACTURE SURGERY     HIP PINNING,CANNULATED Left 12/15/2019   Procedure: CANNULATED HIP PINNING;  Surgeon: Roby Lofts, MD;  Location: [facility];  Service: Orthopedics;  Laterality: Left;   LAPAROSCOPIC CHOLECYSTECTOMY  04/28/2000   TONSILLECTOMY  04/29/1939   TOTAL ABDOMINAL HYSTERECTOMY W/ BILATERAL  SALPINGOOPHORECTOMY  04/29/2003   VAGINAL HYSTERECTOMY  04/29/1979   Past Medical History:  Diagnosis Date   Acute metabolic encephalopathy    Chronic constipation    Chronic left lower quadrant pain    GERD (gastroesophageal reflux disease)    Headache(784.0)    High cholesterol    Hydrosalpinx    LEFT   Hypertension    Lupus    Membranous glomerulonephritis    followed by Dr. Clelia Schaumann   Ovarian fibroma    RIGHT   Pelvic adhesions    Sjogren's syndrome (HCC)    Stroke (HCC)    There were no vitals taken for this visit.  Opioid Risk Score:   Fall Risk Score:  `1  Depression screen PHQ 2/9      No data to display           Review of Systems     Objective:   Physical Exam        Assessment & Plan:

## 2023-08-05 NOTE — Progress Notes (Signed)
 Subjective:    Patient ID: Peggy Ramirez, female    DOB: October 14, 1933, 89 y.o.   MRN: [id number]  HPI: Peggy Ramirez is a 89 y.o. female who is here for HFU appointment for F/U of her   Traumatic subdural hematoma, History of CVA, SIADH, Essential Hypertension and Hyperlipidemia. She presented to [facility] after a reported all without loss of Consciousness. She was Transferred to [facility].   CT Head: WO Contrast:  IMPRESSION: 1. Positive for acute posttraumatic intracranial hemorrhage: - Right side Subdural Hematoma, lobulated with maximal 9-10 mm in thickness, but generally 4 mm. - scattered bilateral SAH in a post trauma pattern. 2. No midline shift or significant intracranial mass effect. 3. No skull fracture.  Right side scalp hematomas and lacerations.  Dr Baldwin Levee H&P: 07/04/2023 89 year old woman with a history of Sjogren's, hypertension, SLE, membranous glomerulonephritis (not on immunosuppression), history CVA on Plavix. She experienced a mechanical fall on 3/7 without loss of consciousness.  She was evaluated in the ED at [facility] and trauma workup performed.  CT scan of her head showed posttraumatic intracranial hemorrhage 9 to 10 mm right SDH, scattered bilateral SAH and a posttrauma pattern.  There was no midline shift, no evidence of scalp fracture.  Cervical spine CT did not show any traumatic fracture.  There is chronic cervical spine degeneration C4-C6.  Neurosurgery was consulted: Dr Andy Bannister recommended conservative care.   Ms. Hatler was admitted to inpatient Rehabilitation on 07/08/2023 and discharged home on 07/25/2023. She is receiving Home Health Therapy with [facility],  Ms. Sardo denies any pain. She rates her pain 0.   Family in room, all questions answered.   Pain Inventory Average Pain 0 Pain Right Now 0 My pain is  no pain  BOWEL Oral laxative use Yes  Type of laxative miralax. colace  BLADDER Pads Frequent urination Yes    Mobility walk without assistance walk with assistance use a cane use a walker how many minutes can you walk? 5 ability to climb steps?  yes do you drive?  no  Function retired I need assistance with the following:  dressing, bathing, toileting, meal prep, household duties, and shopping  Neuro/Psych bladder control problems confusion anxiety  Prior Studies Any changes since last visit?  no  Having a procedure tomorrow 08/06/23  Physicians involved in your care Any changes since last visit?  no  has not seen PCP yet and has procedure with Nundkumar tomorrow   Family History  Problem Relation Age of Onset   Heart disease Other    Anxiety disorder Other    Depression Other    Social History   Socioeconomic History   Marital status: Widowed    Spouse name: Not on file   Number of children: 4   Years of education: 12   Highest education level: Not on file  Occupational History   Occupation: retired    Associate Professor: Occupational hygienist  Tobacco Use   Smoking status: Never   Smokeless tobacco: Never  Vaping Use   Vaping status: Never Used  Substance and Sexual Activity   Alcohol use: No    Alcohol/week: 0.0 standard drinks of alcohol   Drug use: Never   Sexual activity: Not Currently    Birth control/protection: Surgical    Comment: Hysterectomy  Other Topics Concern   Not on file  Social History Narrative   Patient lives at home alone.   Caffeine Use: 1-2 cups daily   Right handed  Social Drivers of Corporate investment banker Strain: Not on file  Food Insecurity: No Food Insecurity (07/05/2023)   Hunger Vital Sign    Worried About Running Out of Food in the Last Year: Never true    Ran Out of Food in the Last Year: Never true  Transportation Needs: No Transportation Needs (07/05/2023)   PRAPARE - Administrator, Civil Service (Medical): No    Lack of Transportation (Non-Medical): No  Physical Activity: Not on file  Stress: Not on file  Social  Connections: Moderately Integrated (07/06/2023)   Social Connection and Isolation Panel [NHANES]    Frequency of Communication with Friends and Family: Twice a week    Frequency of Social Gatherings with Friends and Family: More than three times a week    Attends Religious Services: More than 4 times per year    Active Member of [employer] or Organizations: Yes    Attends Banker Meetings: More than 4 times per year    Marital Status: Widowed   Past Surgical History:  Procedure Laterality Date   APPENDECTOMY  04/28/2000   BIOPSY  12/17/2020   Procedure: BIOPSY;  Surgeon: Vinetta Greening, DO;  [facility];  Service: Endoscopy;;   COLONOSCOPY WITH PROPOFOL N/A 12/17/2020   Procedure: COLONOSCOPY WITH PROPOFOL;  Surgeon: Vinetta Greening, DO;  [facility];  Service: Endoscopy;  Laterality: N/A;   EYE SURGERY Bilateral 2024   cataracts   FRACTURE SURGERY     HIP PINNING,CANNULATED Left 12/15/2019   Procedure: CANNULATED HIP PINNING;  Surgeon: Laneta Pintos, MD;  Location: [facility] OR;  Service: Orthopedics;  Laterality: Left;   LAPAROSCOPIC CHOLECYSTECTOMY  04/28/2000   TONSILLECTOMY  04/29/1939   TOTAL ABDOMINAL HYSTERECTOMY W/ BILATERAL SALPINGOOPHORECTOMY  04/29/2003   VAGINAL HYSTERECTOMY  04/29/1979   Past Medical History:  Diagnosis Date   Acute metabolic encephalopathy    Chronic constipation    Chronic left lower quadrant pain    GERD (gastroesophageal reflux disease)    Headache(784.0)    High cholesterol    Hydrosalpinx    LEFT   Hypertension    Lupus    Membranous glomerulonephritis    followed by Dr. Aggie Horton   Ovarian fibroma    RIGHT   Pelvic adhesions    Sjogren's syndrome (HCC)    Stroke (HCC)    BP 132/71   Pulse 72   Ht 4\' 11"  (1.499 m)   SpO2 99%   BMI 16.34 kg/m   Opioid Risk Score:   Fall Risk Score:  `1  Depression screen [facility] 2/9     08/05/2023    1:57 PM  Depression screen PHQ 2/9  Decreased Interest 0   Down, Depressed, Hopeless 0  PHQ - 2 Score 0  Altered sleeping 3  Tired, decreased energy 0  Change in appetite 0  Feeling bad or failure about yourself  0  Trouble concentrating 0  Moving slowly or fidgety/restless 0  Suicidal thoughts 0  PHQ-9 Score 3    Review of Systems  Constitutional:        Wt loss  Gastrointestinal:  Positive for constipation.  Musculoskeletal:  Positive for gait problem.  Psychiatric/Behavioral:  Positive for confusion. The patient is nervous/anxious.   All other systems reviewed and are negative.      Objective:   Physical Exam Vitals and nursing note reviewed.  Constitutional:      Appearance: Normal appearance.  Cardiovascular:  Rate and Rhythm: Normal rate and regular rhythm.     Pulses: Normal pulses.     Heart sounds: Normal heart sounds.  Pulmonary:     Effort: Pulmonary effort is normal.     Breath sounds: Normal breath sounds.  Musculoskeletal:     Comments: Normal Muscle Bulk and Muscle Testing Reveals:  Upper Extremities: Full ROM and Muscle Strength 5/5 Lower Extremities: Full ROM and Muscle Strength 5/5 Arises from Table slowly Narrow Based  Gait     Skin:    General: Skin is warm and dry.  Neurological:     Mental Status: She is alert and oriented to person, place, and time.  Psychiatric:        Mood and Affect: Mood normal.        Behavior: Behavior normal.         Assessment & Plan:  Traumatic subdural hematoma: Neurosurgery Following.  , History of CVA: Continue to Monitor: PCP Following. Continue to Monitor.   SIADH,: Continue Sodium Tablets. She will F/U with PCP. Continue to Monitor.   Essential Hypertension: Continue current medication regimen. PCP following. Continue to Monitor.   Hyperlipidemia. Continue current medication regimen. PCP following. Continue to Monitor.   F/U in 4- 6 weeks with Dr Dorn Gaskins

## 2023-08-06 ENCOUNTER — Encounter (HOSPITAL_COMMUNITY): Payer: Self-pay | Admitting: Neurosurgery

## 2023-08-06 ENCOUNTER — Inpatient Hospital Stay (HOSPITAL_COMMUNITY): Admitting: Certified Registered Nurse Anesthetist

## 2023-08-06 ENCOUNTER — Inpatient Hospital Stay (HOSPITAL_COMMUNITY)
Admission: RE | Admit: 2023-08-06 | Discharge: 2023-08-07 | DRG: 941 | Disposition: A | Discharge status: Home / Self Care | Attending: Neurosurgery | Admitting: Neurosurgery

## 2023-08-06 ENCOUNTER — Encounter (HOSPITAL_COMMUNITY)
Admission: RE | Disposition: A | Discharge status: Home / Self Care | Payer: Self-pay | Source: Home / Self Care | Attending: Neurosurgery

## 2023-08-06 ENCOUNTER — Other Ambulatory Visit: Payer: Self-pay

## 2023-08-06 ENCOUNTER — Inpatient Hospital Stay (HOSPITAL_COMMUNITY)
Admission: RE | Admit: 2023-08-06 | Discharge: 2023-08-06 | Disposition: A | Discharge status: Home / Self Care | Source: Ambulatory Visit | Attending: Neurosurgery | Admitting: Neurosurgery

## 2023-08-06 DIAGNOSIS — Z888 Allergy status to other drugs, medicaments and biological substances status: Secondary | ICD-10-CM | POA: Diagnosis not present

## 2023-08-06 DIAGNOSIS — Z882 Allergy status to sulfonamides status: Secondary | ICD-10-CM | POA: Diagnosis not present

## 2023-08-06 DIAGNOSIS — Z9049 Acquired absence of other specified parts of digestive tract: Secondary | ICD-10-CM | POA: Diagnosis not present

## 2023-08-06 DIAGNOSIS — Z9889 Other specified postprocedural states: Principal | ICD-10-CM

## 2023-08-06 DIAGNOSIS — I10 Essential (primary) hypertension: Secondary | ICD-10-CM | POA: Diagnosis present

## 2023-08-06 DIAGNOSIS — W19XXXA Unspecified fall, initial encounter: Secondary | ICD-10-CM | POA: Diagnosis present

## 2023-08-06 DIAGNOSIS — E785 Hyperlipidemia, unspecified: Secondary | ICD-10-CM | POA: Diagnosis present

## 2023-08-06 DIAGNOSIS — Z87441 Personal history of nephrotic syndrome: Secondary | ICD-10-CM | POA: Diagnosis not present

## 2023-08-06 DIAGNOSIS — S065XAA Traumatic subdural hemorrhage with loss of consciousness status unknown, initial encounter: Secondary | ICD-10-CM | POA: Diagnosis present

## 2023-08-06 DIAGNOSIS — Z862 Personal history of diseases of the blood and blood-forming organs and certain disorders involving the immune mechanism: Secondary | ICD-10-CM

## 2023-08-06 DIAGNOSIS — S065X0A Traumatic subdural hemorrhage without loss of consciousness, initial encounter: Secondary | ICD-10-CM | POA: Diagnosis not present

## 2023-08-06 DIAGNOSIS — Z881 Allergy status to other antibiotic agents status: Secondary | ICD-10-CM

## 2023-08-06 DIAGNOSIS — E7800 Pure hypercholesterolemia, unspecified: Secondary | ICD-10-CM | POA: Diagnosis present

## 2023-08-06 DIAGNOSIS — Z79899 Other long term (current) drug therapy: Secondary | ICD-10-CM

## 2023-08-06 DIAGNOSIS — I6200 Nontraumatic subdural hemorrhage, unspecified: Secondary | ICD-10-CM | POA: Diagnosis not present

## 2023-08-06 DIAGNOSIS — Z90710 Acquired absence of both cervix and uterus: Secondary | ICD-10-CM

## 2023-08-06 DIAGNOSIS — D320 Benign neoplasm of cerebral meninges: Secondary | ICD-10-CM

## 2023-08-06 DIAGNOSIS — Z8673 Personal history of transient ischemic attack (TIA), and cerebral infarction without residual deficits: Secondary | ICD-10-CM | POA: Diagnosis not present

## 2023-08-06 DIAGNOSIS — M3500 Sicca syndrome, unspecified: Secondary | ICD-10-CM | POA: Diagnosis present

## 2023-08-06 DIAGNOSIS — I6203 Nontraumatic chronic subdural hemorrhage: Secondary | ICD-10-CM | POA: Diagnosis not present

## 2023-08-06 DIAGNOSIS — S065X0D Traumatic subdural hemorrhage without loss of consciousness, subsequent encounter: Principal | ICD-10-CM

## 2023-08-06 HISTORY — PX: IR TRANSCATH/EMBOLIZ: IMG695

## 2023-08-06 HISTORY — PX: RADIOLOGY WITH ANESTHESIA: SHX6223

## 2023-08-06 HISTORY — PX: IR NEURO EACH ADD'L AFTER BASIC UNI RIGHT (MS): IMG5374

## 2023-08-06 HISTORY — PX: IR ANGIO INTRA EXTRACRAN SEL INTERNAL CAROTID UNI R MOD SED: IMG5362

## 2023-08-06 LAB — TYPE AND SCREEN
ABO/RH(D): O NEG
Antibody Screen: NEGATIVE

## 2023-08-06 LAB — APTT: aPTT: 30 s (ref 24–36)

## 2023-08-06 LAB — PROTIME-INR
INR: 1.1 (ref 0.8–1.2)
Prothrombin Time: 13.9 s (ref 11.4–15.2)

## 2023-08-06 SURGERY — RADIOLOGY WITH ANESTHESIA
Anesthesia: General

## 2023-08-06 MED ORDER — CEFAZOLIN SODIUM-DEXTROSE 2-4 GM/100ML-% IV SOLN
INTRAVENOUS | Status: AC
  Filled 2023-08-06: qty 100

## 2023-08-06 MED ORDER — CEFAZOLIN SODIUM-DEXTROSE 2-4 GM/100ML-% IV SOLN: 2.0000 g | INTRAVENOUS | Status: DC

## 2023-08-06 MED ORDER — FENTANYL CITRATE (PF) 100 MCG/2ML IJ SOLN: 25.0000 ug | INTRAMUSCULAR | Status: DC | PRN

## 2023-08-06 MED ORDER — ROCURONIUM BROMIDE 10 MG/ML (PF) SYRINGE
PREFILLED_SYRINGE | INTRAVENOUS | Status: DC | PRN
  Administered 2023-08-06: 50 mg via INTRAVENOUS

## 2023-08-06 MED ORDER — LIDOCAINE 2% (20 MG/ML) 5 ML SYRINGE
INTRAMUSCULAR | Status: DC | PRN
  Administered 2023-08-06: 20 mg via INTRAVENOUS

## 2023-08-06 MED ORDER — SODIUM CHLORIDE 1 G PO TABS
1.0000 g | ORAL_TABLET | Freq: Two times a day (BID) | ORAL | Status: DC
  Administered 2023-08-07: 1 g via ORAL
  Filled 2023-08-06: qty 1

## 2023-08-06 MED ORDER — SUGAMMADEX SODIUM 200 MG/2ML IV SOLN
INTRAVENOUS | Status: DC | PRN
  Administered 2023-08-06: 80 mg via INTRAVENOUS

## 2023-08-06 MED ORDER — LABETALOL HCL 5 MG/ML IV SOLN
INTRAVENOUS | Status: DC | PRN
  Administered 2023-08-06: 20 mg via INTRAVENOUS

## 2023-08-06 MED ORDER — CHLORHEXIDINE GLUCONATE CLOTH 2 % EX PADS: 6.0000 | MEDICATED_PAD | Freq: Once | CUTANEOUS | Status: DC

## 2023-08-06 MED ORDER — PANTOPRAZOLE SODIUM 40 MG IV SOLR
40.0000 mg | Freq: Every day | INTRAVENOUS | Status: DC
  Administered 2023-08-07: 40 mg via INTRAVENOUS
  Filled 2023-08-06: qty 10

## 2023-08-06 MED ORDER — MORPHINE SULFATE (PF) 2 MG/ML IV SOLN
1.0000 mg | INTRAVENOUS | Status: DC | PRN
  Filled 2023-08-06: qty 1

## 2023-08-06 MED ORDER — PROPOFOL 10 MG/ML IV BOLUS
INTRAVENOUS | Status: DC | PRN
  Administered 2023-08-06: 20 mg via INTRAVENOUS
  Administered 2023-08-06: 50 mg via INTRAVENOUS

## 2023-08-06 MED ORDER — SODIUM CHLORIDE 0.9 % IV SOLN: INTRAVENOUS | Status: DC

## 2023-08-06 MED ORDER — FENTANYL CITRATE (PF) 250 MCG/5ML IJ SOLN
INTRAMUSCULAR | Status: DC | PRN
Start: 2023-08-06 — End: 2023-08-06
  Administered 2023-08-06: 25 ug via INTRAVENOUS

## 2023-08-06 MED ORDER — ORAL CARE MOUTH RINSE: 15.0000 mL | Freq: Once | OROMUCOSAL | Status: AC

## 2023-08-06 MED ORDER — OXYCODONE HCL 5 MG PO TABS: 5.0000 mg | ORAL_TABLET | Freq: Once | ORAL | Status: DC | PRN

## 2023-08-06 MED ORDER — DOCUSATE SODIUM 100 MG PO CAPS
100.0000 mg | ORAL_CAPSULE | Freq: Two times a day (BID) | ORAL | Status: DC
  Administered 2023-08-07: 100 mg via ORAL
  Filled 2023-08-06: qty 1

## 2023-08-06 MED ORDER — OXYCODONE HCL 5 MG/5ML PO SOLN: 5.0000 mg | Freq: Once | ORAL | Status: DC | PRN

## 2023-08-06 MED ORDER — IOHEXOL 300 MG/ML  SOLN
150.0000 mL | Freq: Once | INTRAMUSCULAR | Status: AC | PRN
  Administered 2023-08-06: 50 mL via INTRA_ARTERIAL

## 2023-08-06 MED ORDER — AMLODIPINE BESYLATE 2.5 MG PO TABS
2.5000 mg | ORAL_TABLET | Freq: Every day | ORAL | Status: DC
  Administered 2023-08-07: 2.5 mg via ORAL
  Filled 2023-08-06: qty 1

## 2023-08-06 MED ORDER — AMISULPRIDE (ANTIEMETIC) 5 MG/2ML IV SOLN
10.0000 mg | Freq: Once | INTRAVENOUS | Status: DC | PRN
Start: 2023-08-06 — End: 2023-08-06

## 2023-08-06 MED ORDER — POLYETHYLENE GLYCOL 3350 17 G PO PACK
17.0000 g | PACK | Freq: Every day | ORAL | Status: DC
  Administered 2023-08-07: 17 g via ORAL
  Filled 2023-08-06: qty 1

## 2023-08-06 MED ORDER — ACETAMINOPHEN 650 MG RE SUPP: 650.0000 mg | RECTAL | Status: DC | PRN

## 2023-08-06 MED ORDER — MELATONIN 5 MG PO TABS: 5.0000 mg | ORAL_TABLET | Freq: Every evening | ORAL | Status: DC | PRN

## 2023-08-06 MED ORDER — HEPARIN SODIUM (PORCINE) 1000 UNIT/ML IJ SOLN
INTRAMUSCULAR | Status: DC | PRN
  Administered 2023-08-06: 2000 [IU] via INTRAVENOUS

## 2023-08-06 MED ORDER — SIMVASTATIN 20 MG PO TABS
20.0000 mg | ORAL_TABLET | Freq: Every day | ORAL | Status: DC
  Administered 2023-08-07: 20 mg via ORAL
  Filled 2023-08-06: qty 1

## 2023-08-06 MED ORDER — ACETAMINOPHEN 325 MG PO TABS: 650.0000 mg | ORAL_TABLET | ORAL | Status: DC | PRN

## 2023-08-06 MED ORDER — CHLORHEXIDINE GLUCONATE 0.12 % MT SOLN
OROMUCOSAL | Status: AC
  Administered 2023-08-06: 15 mL via OROMUCOSAL
  Filled 2023-08-06: qty 15

## 2023-08-06 MED ORDER — BUSPIRONE HCL 5 MG PO TABS
5.0000 mg | ORAL_TABLET | Freq: Two times a day (BID) | ORAL | Status: DC
  Administered 2023-08-07 (×2): 5 mg via ORAL
  Filled 2023-08-06 (×2): qty 1

## 2023-08-06 MED ORDER — PHENYLEPHRINE 80 MCG/ML (10ML) SYRINGE FOR IV PUSH (FOR BLOOD PRESSURE SUPPORT)
PREFILLED_SYRINGE | INTRAVENOUS | Status: DC | PRN
  Administered 2023-08-06: 160 ug via INTRAVENOUS

## 2023-08-06 MED ORDER — CLEVIDIPINE BUTYRATE 0.5 MG/ML IV EMUL
INTRAVENOUS | Status: DC | PRN
  Administered 2023-08-06: 1 mg/h via INTRAVENOUS

## 2023-08-06 MED ORDER — ADULT MULTIVITAMIN W/MINERALS CH
1.0000 | ORAL_TABLET | Freq: Every day | ORAL | Status: DC
  Administered 2023-08-07: 1 via ORAL
  Filled 2023-08-06: qty 1

## 2023-08-06 MED ORDER — ONDANSETRON HCL 4 MG/2ML IJ SOLN
4.0000 mg | INTRAMUSCULAR | Status: DC | PRN
  Filled 2023-08-06: qty 2

## 2023-08-06 MED ORDER — CHLORHEXIDINE GLUCONATE 0.12 % MT SOLN: 15.0000 mL | Freq: Once | OROMUCOSAL | Status: AC

## 2023-08-06 MED ORDER — LABETALOL HCL 5 MG/ML IV SOLN: 10.0000 mg | INTRAVENOUS | Status: DC | PRN

## 2023-08-06 MED ORDER — ONDANSETRON HCL 4 MG PO TABS: 4.0000 mg | ORAL_TABLET | ORAL | Status: DC | PRN

## 2023-08-06 MED ORDER — HYDROCODONE-ACETAMINOPHEN 5-325 MG PO TABS: 1.0000 | ORAL_TABLET | ORAL | Status: DC | PRN

## 2023-08-06 MED ORDER — ONDANSETRON HCL 4 MG/2ML IJ SOLN
INTRAMUSCULAR | Status: DC | PRN
  Administered 2023-08-06: 4 mg via INTRAVENOUS

## 2023-08-06 MED ORDER — ALPRAZOLAM 0.25 MG PO TABS: 0.2500 mg | ORAL_TABLET | Freq: Two times a day (BID) | ORAL | Status: DC | PRN

## 2023-08-06 MED ORDER — FENTANYL CITRATE (PF) 100 MCG/2ML IJ SOLN
INTRAMUSCULAR | Status: AC
  Filled 2023-08-06: qty 2

## 2023-08-06 NOTE — Op Note (Signed)
 ENDOVASCULAR NEUROSURGERY OPERATIVE NOTE   PROCEDURE: Onyx embolization of right middle meningeal artery   HISTORY:   The patient is a 89 y.o. yo female initially presenting after a fall while on Plavix.  She was initially diagnosed with a small acute subdural hematoma.  Follow-up scan has demonstrated conversion and enlargement of chronic subdural hematoma on the right side.  Despite this, she remains largely asymptomatic.  She was seen in the outpatient neurosurgery clinic where middle meningeal artery embolization was recommended.  The risks, benefits, and alternatives to the procedure were discussed in detail with the patient's son.  After all questions were answered informed consent was obtained and witnessed.  APPROACH:   The technical aspects of the procedure as well as its potential risks and benefits were reviewed with the patient and family. These risks included but were not limited stroke leading to weakness, numbness, paralysis, coma, death, allergic reaction, damage to organs/vital structures, and hematoma formation. With an understanding of these risks, informed consent was obtained and witnessed.    The patient was placed in the supine position on the angiography table and the skin of right groin prepped in the usual sterile fashion. The procedure was performed under general anesthesia monitored by the anesthesia service.  Short 5Fr sheath was placed in the right common femoral artery using standard seldinger technique. Position of the sheath was documented with fluoro-phase images.    HEPARIN:  2000 Units total.    CONTRAST AGENT:  See IR records   FLUOROSCOPY TIME:  See IR records    CATHETER(S) AND WIRE(S):    5-French MPD guide catheter   5 French Simmons 2 glide catheter 0.035" glidewire   035 260 cm exchange Glidewire Apollo microcatheter [place] 10 microwire  LIQUID EMBOLIC AGENT USED: Onyx 18  VESSELS CATHETERIZED:   Right internal carotid   Right external  carotid   Right middle meningeal artery Right common femoral  VESSELS STUDIED:   Right internal carotid, head Right external carotid, head Right middle meningeal artery, microcatheter run Right external carotid, post-embolization  Right femoral  PROCEDURAL NARRATIVE:   The MPD guide catheter was introduced over the microwire.  Attempts were made to select the innominate artery unsuccessfully due to the type III configuration of the aortic arch.  The MPD guide catheter was therefore removed, and the [facility] 2 catheter was introduced and the secondary curve reformed over the aortic arch.  The right innominate and right common carotid artery were then selected.  The exchange Glidewire was then advanced into the distal cervical internal carotid artery.  The [facility] 2 catheter was exchanged over the Glidewire for the MPD guide catheter.  The wire was then removed.  Cerebral angiogram was taken.  The guide catheter was then positioned in the right external carotid artery and angiogram again taken.  After review of the images I elected to proceed with the embolization.  Under roadmap guidance, the Apollo microcatheter was introduced over the microwire and the middle meningeal artery was selected.  Microcatheter run was then taken.  It appeared there was some perfusion to the right orbit from a proximal branch of the middle meningeal artery.  I therefore reintroduced the wire, and the microcatheter was advanced distal to this vessel.  Subsequent microcatheter run did not reveal any further perfusion to the orbit or the brain and we elected to proceed with the embolization. The catheter was flushed with DMSO.  Under standard roadmap and blank roadmap technique, the middle meningeal artery was embolized with  Onyx.  After completion of the embolization, the microcatheter was removed without incident.  Final control angiogram was taken through the guide catheter.  After review of the images, the guide catheter was  removed without incident.  INTERPRETATION:   Right internal carotid, head:   Injection reveals the presence of a widely patent ICA, M1, and A1 segments and their branches. No aneurysms, arteriovenous malformations, or high flow fistulas are visualized.  The ophthalmic artery appears to have normal origin visualized retinal/choroidal perfusion.  The parenchymal and venous phases are unremarkable, with some mass effect upon the convexity from the known overlying chronic subdural hematoma. The venous sinuses are widely patent.    Right external carotid, head:   Visualized cranial branches of the external carotid artery are unremarkable.  Of note, there is normal origin of the middle meningeal artery.  No perfusion of the brain is noted.  There is no opacification of the intracranial cortical veins or dural venous sinuses.  Right middle meningeal artery: Microcatheter runs taken in the middle meningeal artery reveals no perfusion of the underlying brain.  There is a anteriorly directed branch of the middle meningeal artery which appears to enter into the right orbit.  Subsequent microcatheter runs distal to this vessel do not reveal any further perfusion within the orbit.  Right external carotid, post-embolization: The visualized branches of the external carotid artery remain unremarkable.  Onyx cast is seen within the intracranial middle meningeal artery, while the more proximal portion of this vessel including the bifurcation vessel which traverses the orbit remained patent.  Right femoral:    Normal vessel. No significant atherosclerotic disease. Arterial sheath in adequate position at the common femoral bifurcation.   DISPOSITION:  Upon completion of the study, the femoral sheath was removed and hemostasis obtained using manual compression. Good proximal and distal lower extremity pulses were documented upon achievement of hemostasis. The procedure was well tolerated and no early complications  were observed.  The patient was transferred to the PACU to be positioned flat in bed for 5 hours.    IMPRESSION:  1. Successful Onyx embolization of the right middle meningeal artery for chronic subdural hematoma.    Lisbeth Renshaw, MD [facility]

## 2023-08-06 NOTE — Anesthesia Procedure Notes (Addendum)
 Procedure Name: Intubation Date/Time: 08/06/2023 3:28 PM  Performed by: Cy Blamer, CRNAPre-anesthesia Checklist: Patient identified, Emergency Drugs available, Suction available and Patient being monitored Patient Re-evaluated:Patient Re-evaluated prior to induction Oxygen Delivery Method: Circle system utilized Preoxygenation: Pre-oxygenation with 100% oxygen Induction Type: IV induction Ventilation: Mask ventilation without difficulty Laryngoscope Size: Mac and 3 Grade View: Grade I Tube type: Oral Tube size: 6.5 mm Number of attempts: 1 Airway Equipment and Method: Stylet and Oral airway Placement Confirmation: ETT inserted through vocal cords under direct vision, positive ETCO2 and breath sounds checked- equal and bilateral Secured at: 20 cm Tube secured with: Tape Dental Injury: Teeth and Oropharynx as per pre-operative assessment  Comments: Placed by [facility]

## 2023-08-06 NOTE — H&P (Signed)
 Chief Complaint   Subdural hematooma  History of Present Illness  [name] is a 89 year old woman I am seeing for the first time although she was initially seen in consultation as an inpatient by my partner, Dr. Maisie Fus.  Briefly, she suffered a fall about a month ago while on Plavix.  She was initially diagnosed with a subdural hematoma and follow-up CT scans demonstrated enlargement as the acute subdural became chronic.  While she did not require operative evacuation, with the enlargement of the chronic subdural hematoma she was referred for evaluation for possible middle meningeal artery embolization.  Of note, the patient has a history of stroke, lupus, Sjogren's disease, and was previously on Plavix which has been discontinued.  She does also have a history of hypertension and membranous glomerular nephritis.  Past Medical History   Past Medical History:  Diagnosis Date   Acute metabolic encephalopathy    Chronic constipation    Chronic left lower quadrant pain    GERD (gastroesophageal reflux disease)    Headache(784.0)    High cholesterol    Hydrosalpinx    LEFT   Hypertension    Lupus    Membranous glomerulonephritis    followed by Dr. Clelia Schaumann   Ovarian fibroma    RIGHT   Pelvic adhesions    Sjogren's syndrome (HCC)    Stroke [facility])     Past Surgical History   Past Surgical History:  Procedure Laterality Date   APPENDECTOMY  04/28/2000   BIOPSY  12/17/2020   Procedure: BIOPSY;  Surgeon: Lanelle Bal, DO;  Location: [facility] ENDO SUITE;  Service: Endoscopy;;   COLONOSCOPY WITH PROPOFOL N/A 12/17/2020   Procedure: COLONOSCOPY WITH PROPOFOL;  Surgeon: Lanelle Bal, DO;  Location: [facility] ENDO SUITE;  Service: Endoscopy;  Laterality: N/A;   EYE SURGERY Bilateral 2024   cataracts   FRACTURE SURGERY     HIP PINNING,CANNULATED Left 12/15/2019   Procedure: CANNULATED HIP PINNING;  Surgeon: Roby Lofts, MD;  Location: [facility] OR;  Service: Orthopedics;  Laterality:  Left;   LAPAROSCOPIC CHOLECYSTECTOMY  04/28/2000   TONSILLECTOMY  04/29/1939   TOTAL ABDOMINAL HYSTERECTOMY W/ BILATERAL SALPINGOOPHORECTOMY  04/29/2003   VAGINAL HYSTERECTOMY  04/29/1979    Social History   Social History   Tobacco Use   Smoking status: Never   Smokeless tobacco: Never  Vaping Use   Vaping status: Never Used  Substance Use Topics   Alcohol use: No    Alcohol/week: 0.0 standard drinks of alcohol   Drug use: Never    Medications   Prior to Admission medications   Medication Sig Start Date End Date Taking? Authorizing Provider  acetaminophen (TYLENOL) 500 MG tablet Take 2 tablets (1,000 mg total) by mouth every 6 (six) hours as needed for headache or fever. 07/24/23   Angiulli, Mcarthur Rossetti, PA-C  Acidophilus Lactobacillus CAPS Take 250 mg by mouth 2 (two) times daily. 07/24/23   Angiulli, Mcarthur Rossetti, PA-C  ALPRAZolam Prudy Feeler) 0.25 MG tablet Take 1 tablet (0.25 mg total) by mouth 2 (two) times daily as needed for anxiety. 07/24/23   Angiulli, Mcarthur Rossetti, PA-C  amLODipine (NORVASC) 2.5 MG tablet TAKE 1 TABLET BY MOUTH DAILY  (DOSE DECREASED) 07/27/23   Antoine Poche, MD  Ascorbic Acid (VITAMIN C) 1000 MG tablet Take 1 tablet (1,000 mg total) by mouth daily. 07/15/23   Angiulli, Mcarthur Rossetti, PA-C  busPIRone (BUSPAR) 5 MG tablet Take 1 tablet (5 mg total) by mouth 2 (two) times daily. 07/24/23  Angiulli, Mcarthur Rossetti, PA-C  Carboxymethylcellulose Sodium (REFRESH LIQUIGEL OP) Place 1 drop into both eyes in the morning, at noon, in the evening, and at bedtime. Gel drops    [provider]  docusate sodium (COLACE) 100 MG capsule Take 1 capsule (100 mg total) by mouth 2 (two) times daily. 07/15/23   Angiulli, Mcarthur Rossetti, PA-C  loratadine (CLARITIN) 10 MG tablet Take 10 mg by mouth daily as needed for allergies.    [provider]  melatonin 5 MG TABS Take 1 tablet (5 mg total) by mouth at bedtime as needed. 07/24/23   Angiulli, Mcarthur Rossetti, PA-C  Multiple Vitamin  (MULTIVITAMIN) tablet Take 1 tablet by mouth daily.    [provider]  polyethylene glycol (MIRALAX / GLYCOLAX) 17 g packet Take 17 g by mouth daily. 07/24/23   Angiulli, Mcarthur Rossetti, PA-C  simvastatin (ZOCOR) 20 MG tablet Take 1 tablet (20 mg total) by mouth daily. 07/15/23   Angiulli, Mcarthur Rossetti, PA-C  sodium chloride 1 g tablet Take 1 tablet (1 g total) by mouth 2 (two) times daily with a meal. 07/24/23   Angiulli, Mcarthur Rossetti, PA-C    Allergies   Allergies  Allergen Reactions   Atorvastatin Other (See Comments)    Muscle aches   Cellcept [Mycophenolate Mofetil] Other (See Comments)    unknown   Medrol [Methylprednisolone] Other (See Comments)    unknown   Mycophenolate Mofetil Other (See Comments)    unknown   Sulfonamide Derivatives Nausea Only   Topiramate Other (See Comments)    unknown   Verapamil Other (See Comments)    Felt shakey   Levofloxacin Rash    Tolerated Cipro for UTI 06/2023    Review of Systems  ROS  Neurologic Exam  Confused, inappropriate speech CN grossly intact Motor exam: Upper Extremities Deltoid Bicep Tricep Grip  Right 5/5 5/5 5/5 5/5  Left 5/5 5/5 5/5 5/5   Lower Extremities IP Quad PF DF EHL  Right 5/5 5/5 5/5 5/5 5/5  Left 5/5 5/5 5/5 5/5 5/5   Sensation grossly intact to LT  Imaging  Multiple CT scans throughout the month of March 2025 were personally reviewed and demonstrate transformation of a small acute right convexity subdural hematoma into a larger chronic subdural hematoma with progressively increasing mass effect upon the right cerebral hemisphere.  Most recent scan on the 26th of March reveals a right subdural measuring up to 12 mm in thickness.  Impression  - 89 y.o. female with enlarging chronic right SDH, largely asymptomatic at this point  Plan  - Will proceed with right MMA embolization  I have reviewed the indications for the procedure as well as the details of the procedure and the expected postoperative course and  recovery at length with the patient's son. We have also reviewed in detail the risks, benefits, and alternatives to the procedure. All questions were answered and Willamina S Rasmus's son provided informed consent to proceed on the patient's behalf.  Lisbeth Renshaw, MD [facility]

## 2023-08-06 NOTE — Anesthesia Procedure Notes (Signed)
 Arterial Line Insertion Start/End4/01/2024 2:10 PM, 08/06/2023 2:20 PM Performed by: Darryl Nestle, CRNA, CRNA  Patient location: Pre-op. Preanesthetic checklist: patient identified, IV checked, site marked, risks and benefits discussed, surgical consent, monitors and equipment checked, pre-op evaluation, timeout performed and anesthesia consent Lidocaine 1% used for infiltration Left, radial was placed Hand hygiene performed  and maximum sterile barriers used  Allen's test indicative of satisfactory collateral circulation Attempts: 1 Procedure performed without using ultrasound guided technique. Following insertion, dressing applied and Biopatch. Post procedure assessment: normal and unchanged  Patient tolerated the procedure well with no immediate complications.

## 2023-08-06 NOTE — Sedation Documentation (Signed)
 Patient brought to IR room 3 for procedure by crna. Confirmed correct patient. Patient moved to procedure table by staff and secured to monitors. Please see crna notes and vitals.

## 2023-08-06 NOTE — Transfer of Care (Signed)
 Immediate Anesthesia Transfer of Care Note  Patient: Peggy Ramirez  Procedure(s) Performed: RADIOLOGY WITH ANESTHESIA IR TRANSCATH/EMBOLIZ  Patient Location: PACU  Anesthesia Type:General  Level of Consciousness: awake, alert , patient cooperative, and confused  Airway & Oxygen Therapy: Patient Spontanous Breathing and Patient connected to face mask oxygen  Post-op Assessment: Report given to RN, Post -op Vital signs reviewed and stable, Patient moving all extremities X 4, and Patient able to stick tongue midline  Post vital signs: Reviewed and stable  Last Vitals:  Vitals Value Taken Time  BP 141/63   Temp 98.6   Pulse 62   Resp 16   SpO2 100     Last Pain:  Vitals:   08/06/23 1245  TempSrc: Oral         Complications: No notable events documented.

## 2023-08-07 ENCOUNTER — Encounter (HOSPITAL_COMMUNITY): Payer: Self-pay | Admitting: Neurosurgery

## 2023-08-07 NOTE — Progress Notes (Signed)
 Upon time of discharge orders, patient had not yet eaten (didn't have a diet order) or mobilized since surgery. Dr. Conchita Paris paged and verbal order for regular diet. Dr. Conchita Paris declined this nurse's recommendation for PT/OT eval and requested nursing staff to mobilize and speak with her son about discharge. Patient's son arrived shortly after and stated that he lives with her and feels she is at her baseline and feels comfortable with her discharging home today.  Patient ate breakfast, mobilized with this nurse in the hallway with holding on to son's hand and was steady. IV was taken out, catheter tip intact. Discharge paperwork and instructions given to patient and her son. Patient was taken down to son's car in a wheelchair by [place] NT.

## 2023-08-07 NOTE — Discharge Summary (Signed)
 Physician Discharge Summary  Patient ID: Peggy Ramirez MRN: [id number] DOB/AGE: 88-Sep-1935 88 y.o.  Admit date: 08/06/2023 Discharge date: 08/07/2023  Admission Diagnoses:  Subdural hematoma  Discharge Diagnoses:  Same Principal Problem:   Status post coil embolization of cerebral aneurysm Active Problems:   Subdural hematoma [facility])   Discharged Condition: Stable  Hospital Course:  Peggy Ramirez is a 89 y.o. female admitted after elective right middle meningeal artery embolization.  Patient was at baseline postoperatively monitored in the progressive care unit.  She was ambulating normally, tolerating diet, with minimal pain.  She was therefore discharged in stable condition.  Treatments: Surgery - right MMA embolization  Discharge Exam: Blood pressure 129/64, pulse 64, temperature 97.8 F (36.6 C), temperature source Oral, resp. rate 14, height 5' (1.524 m), weight 36.7 kg, SpO2 98%. Awake, alert, confused Speech fluent CN grossly intact 5/5 BUE/BLE Groin site soft  Disposition: Discharge disposition: 01-Home or Self Care       Discharge Instructions     Call MD for:  redness, tenderness, or signs of infection (pain, swelling, redness, odor or green/yellow discharge around incision site)   Complete by: As directed    Call MD for:  temperature >100.4   Complete by: As directed    Diet - low sodium heart healthy   Complete by: As directed    Discharge instructions   Complete by: As directed    Walk at home as much as possible, at least 4 times / day   Increase activity slowly   Complete by: As directed    Lifting restrictions   Complete by: As directed    No lifting > 10 lbs   May shower / Bathe   Complete by: As directed    48 hours after surgery   May walk up steps   Complete by: As directed    No wound care   Complete by: As directed    Other Restrictions   Complete by: As directed    No bending/twisting at waist      Allergies as of  08/07/2023       Reactions   Atorvastatin Other (See Comments)   Muscle aches   Cellcept [mycophenolate Mofetil] Other (See Comments)   unknown   Medrol [methylprednisolone] Other (See Comments)   unknown   Mycophenolate Mofetil Other (See Comments)   unknown   Sulfonamide Derivatives Nausea Only   Topiramate Other (See Comments)   unknown   Verapamil Other (See Comments)   Felt shakey   Levofloxacin Rash   Tolerated Cipro for UTI 06/2023        Medication List     TAKE these medications    acetaminophen 500 MG tablet Commonly known as: TYLENOL Take 2 tablets (1,000 mg total) by mouth every 6 (six) hours as needed for headache or fever.   Acidophilus Caps capsule Take 250 mg by mouth 2 (two) times daily.   ALPRAZolam 0.25 MG tablet Commonly known as: XANAX Take 1 tablet (0.25 mg total) by mouth 2 (two) times daily as needed for anxiety.   amLODipine 2.5 MG tablet Commonly known as: NORVASC TAKE 1 TABLET BY MOUTH DAILY  (DOSE DECREASED)   busPIRone 5 MG tablet Commonly known as: BUSPAR Take 1 tablet (5 mg total) by mouth 2 (two) times daily.   docusate sodium 100 MG capsule Commonly known as: COLACE Take 1 capsule (100 mg total) by mouth 2 (two) times daily.   loratadine 10 MG tablet Commonly known as:  CLARITIN Take 10 mg by mouth daily as needed for allergies.   melatonin 5 MG Tabs Take 1 tablet (5 mg total) by mouth at bedtime as needed.   multivitamin tablet Take 1 tablet by mouth daily.   polyethylene glycol 17 g packet Commonly known as: MIRALAX / GLYCOLAX Take 17 g by mouth daily.   REFRESH LIQUIGEL OP Place 1 drop into both eyes in the morning, at noon, in the evening, and at bedtime. Gel drops   simvastatin 20 MG tablet Commonly known as: ZOCOR Take 1 tablet (20 mg total) by mouth daily.   sodium chloride 1 g tablet Take 1 tablet (1 g total) by mouth 2 (two) times daily with a meal.   vitamin C 1000 MG tablet Take 1 tablet (1,000 mg  total) by mouth daily.        Follow-up Information     Bedelia Person, MD Follow up in 3 week(s).   Specialty: Neurosurgery Contact information: [address] [place] [address] [phone]                 Signed: Jackelyn Hoehn 08/07/2023, 7:08 AM

## 2023-08-09 NOTE — Anesthesia Postprocedure Evaluation (Signed)
 Anesthesia Post Note  Patient: Peggy Ramirez  Procedure(s) Performed: RADIOLOGY WITH ANESTHESIA IR TRANSCATH/EMBOLIZ IR ANGIO INTRA EXTRACRAN SEL INTERNAL CAROTID UNI R MOD SED IR NEURO EACH ADD'L AFTER BASIC UNI RIGHT (MS)     Patient location during evaluation: PACU Anesthesia Type: General Level of consciousness: awake and alert Pain management: pain level controlled Vital Signs Assessment: post-procedure vital signs reviewed and stable Respiratory status: spontaneous breathing, nonlabored ventilation, respiratory function stable and patient connected to nasal cannula oxygen Cardiovascular status: blood pressure returned to baseline and stable Postop Assessment: no apparent nausea or vomiting Anesthetic complications: no  No notable events documented.  Last Vitals:  Vitals:   08/07/23 0400 08/07/23 0800  BP: (!) 154/48 (!) 149/85  Pulse: 76 72  Resp: 17 19  Temp:  36.8 C  SpO2: 99% 98%    Last Pain:  Vitals:   08/07/23 0800  TempSrc: Oral  PainSc:    Pain Goal:                   Jerrine Urschel L Laurabeth Yip

## 2023-08-10 DIAGNOSIS — S065XAA Traumatic subdural hemorrhage with loss of consciousness status unknown, initial encounter: Secondary | ICD-10-CM | POA: Diagnosis not present

## 2023-08-10 DIAGNOSIS — E7800 Pure hypercholesterolemia, unspecified: Secondary | ICD-10-CM | POA: Diagnosis not present

## 2023-08-10 DIAGNOSIS — E213 Hyperparathyroidism, unspecified: Secondary | ICD-10-CM | POA: Diagnosis not present

## 2023-08-10 DIAGNOSIS — M3500 Sicca syndrome, unspecified: Secondary | ICD-10-CM | POA: Diagnosis not present

## 2023-08-10 DIAGNOSIS — Z681 Body mass index (BMI) 19 or less, adult: Secondary | ICD-10-CM | POA: Diagnosis not present

## 2023-08-11 DIAGNOSIS — S065X0D Traumatic subdural hemorrhage without loss of consciousness, subsequent encounter: Secondary | ICD-10-CM | POA: Diagnosis not present

## 2023-08-11 DIAGNOSIS — E785 Hyperlipidemia, unspecified: Secondary | ICD-10-CM | POA: Diagnosis not present

## 2023-08-11 DIAGNOSIS — Z556 Problems related to health literacy: Secondary | ICD-10-CM | POA: Diagnosis not present

## 2023-08-11 DIAGNOSIS — K5909 Other constipation: Secondary | ICD-10-CM | POA: Diagnosis not present

## 2023-08-11 DIAGNOSIS — M3500 Sicca syndrome, unspecified: Secondary | ICD-10-CM | POA: Diagnosis not present

## 2023-08-11 DIAGNOSIS — E210 Primary hyperparathyroidism: Secondary | ICD-10-CM | POA: Diagnosis not present

## 2023-08-11 DIAGNOSIS — Z8673 Personal history of transient ischemic attack (TIA), and cerebral infarction without residual deficits: Secondary | ICD-10-CM | POA: Diagnosis not present

## 2023-08-11 DIAGNOSIS — Z9181 History of falling: Secondary | ICD-10-CM | POA: Diagnosis not present

## 2023-08-11 DIAGNOSIS — I10 Essential (primary) hypertension: Secondary | ICD-10-CM | POA: Diagnosis not present

## 2023-08-11 DIAGNOSIS — E7800 Pure hypercholesterolemia, unspecified: Secondary | ICD-10-CM | POA: Diagnosis not present

## 2023-08-11 DIAGNOSIS — E871 Hypo-osmolality and hyponatremia: Secondary | ICD-10-CM | POA: Diagnosis not present

## 2023-08-11 DIAGNOSIS — K219 Gastro-esophageal reflux disease without esophagitis: Secondary | ICD-10-CM | POA: Diagnosis not present

## 2023-08-13 ENCOUNTER — Other Ambulatory Visit (HOSPITAL_COMMUNITY): Payer: Self-pay | Admitting: Neurosurgery

## 2023-08-13 ENCOUNTER — Encounter (HOSPITAL_COMMUNITY): Payer: Self-pay | Admitting: Neurosurgery

## 2023-08-13 ENCOUNTER — Encounter (HOSPITAL_COMMUNITY): Payer: Self-pay

## 2023-08-13 DIAGNOSIS — K5909 Other constipation: Secondary | ICD-10-CM | POA: Diagnosis not present

## 2023-08-13 DIAGNOSIS — D320 Benign neoplasm of cerebral meninges: Secondary | ICD-10-CM

## 2023-08-13 DIAGNOSIS — E785 Hyperlipidemia, unspecified: Secondary | ICD-10-CM | POA: Diagnosis not present

## 2023-08-13 DIAGNOSIS — M3500 Sicca syndrome, unspecified: Secondary | ICD-10-CM | POA: Diagnosis not present

## 2023-08-13 DIAGNOSIS — S065X0D Traumatic subdural hemorrhage without loss of consciousness, subsequent encounter: Secondary | ICD-10-CM | POA: Diagnosis not present

## 2023-08-13 DIAGNOSIS — I10 Essential (primary) hypertension: Secondary | ICD-10-CM | POA: Diagnosis not present

## 2023-08-13 HISTORY — PX: IR ANGIO EXTERNAL CAROTID SEL EXT CAROTID UNI R MOD SED: IMG5371

## 2023-08-13 HISTORY — PX: IR ANGIOGRAM FOLLOW UP STUDY: IMG697

## 2023-08-16 DIAGNOSIS — S065XAA Traumatic subdural hemorrhage with loss of consciousness status unknown, initial encounter: Secondary | ICD-10-CM | POA: Diagnosis not present

## 2023-08-18 DIAGNOSIS — K5909 Other constipation: Secondary | ICD-10-CM | POA: Diagnosis not present

## 2023-08-18 DIAGNOSIS — Z556 Problems related to health literacy: Secondary | ICD-10-CM | POA: Diagnosis not present

## 2023-08-18 DIAGNOSIS — Z8673 Personal history of transient ischemic attack (TIA), and cerebral infarction without residual deficits: Secondary | ICD-10-CM | POA: Diagnosis not present

## 2023-08-18 DIAGNOSIS — K219 Gastro-esophageal reflux disease without esophagitis: Secondary | ICD-10-CM | POA: Diagnosis not present

## 2023-08-18 DIAGNOSIS — Z9181 History of falling: Secondary | ICD-10-CM | POA: Diagnosis not present

## 2023-08-18 DIAGNOSIS — E210 Primary hyperparathyroidism: Secondary | ICD-10-CM | POA: Diagnosis not present

## 2023-08-18 DIAGNOSIS — S065X0D Traumatic subdural hemorrhage without loss of consciousness, subsequent encounter: Secondary | ICD-10-CM | POA: Diagnosis not present

## 2023-08-18 DIAGNOSIS — M3500 Sicca syndrome, unspecified: Secondary | ICD-10-CM | POA: Diagnosis not present

## 2023-08-18 DIAGNOSIS — E7800 Pure hypercholesterolemia, unspecified: Secondary | ICD-10-CM | POA: Diagnosis not present

## 2023-08-18 DIAGNOSIS — I10 Essential (primary) hypertension: Secondary | ICD-10-CM | POA: Diagnosis not present

## 2023-08-18 DIAGNOSIS — E871 Hypo-osmolality and hyponatremia: Secondary | ICD-10-CM | POA: Diagnosis not present

## 2023-08-18 DIAGNOSIS — E785 Hyperlipidemia, unspecified: Secondary | ICD-10-CM | POA: Diagnosis not present

## 2023-08-23 DIAGNOSIS — E210 Primary hyperparathyroidism: Secondary | ICD-10-CM | POA: Diagnosis not present

## 2023-08-23 DIAGNOSIS — E785 Hyperlipidemia, unspecified: Secondary | ICD-10-CM | POA: Diagnosis not present

## 2023-08-23 DIAGNOSIS — E871 Hypo-osmolality and hyponatremia: Secondary | ICD-10-CM | POA: Diagnosis not present

## 2023-08-23 DIAGNOSIS — E7800 Pure hypercholesterolemia, unspecified: Secondary | ICD-10-CM | POA: Diagnosis not present

## 2023-08-23 DIAGNOSIS — K219 Gastro-esophageal reflux disease without esophagitis: Secondary | ICD-10-CM | POA: Diagnosis not present

## 2023-08-23 DIAGNOSIS — Z9181 History of falling: Secondary | ICD-10-CM | POA: Diagnosis not present

## 2023-08-23 DIAGNOSIS — S065X0D Traumatic subdural hemorrhage without loss of consciousness, subsequent encounter: Secondary | ICD-10-CM | POA: Diagnosis not present

## 2023-08-23 DIAGNOSIS — K5909 Other constipation: Secondary | ICD-10-CM | POA: Diagnosis not present

## 2023-08-23 DIAGNOSIS — I10 Essential (primary) hypertension: Secondary | ICD-10-CM | POA: Diagnosis not present

## 2023-08-23 DIAGNOSIS — M3500 Sicca syndrome, unspecified: Secondary | ICD-10-CM | POA: Diagnosis not present

## 2023-08-23 DIAGNOSIS — Z8673 Personal history of transient ischemic attack (TIA), and cerebral infarction without residual deficits: Secondary | ICD-10-CM | POA: Diagnosis not present

## 2023-08-23 DIAGNOSIS — Z556 Problems related to health literacy: Secondary | ICD-10-CM | POA: Diagnosis not present

## 2023-08-25 DIAGNOSIS — K5909 Other constipation: Secondary | ICD-10-CM | POA: Diagnosis not present

## 2023-08-25 DIAGNOSIS — E7800 Pure hypercholesterolemia, unspecified: Secondary | ICD-10-CM | POA: Diagnosis not present

## 2023-08-25 DIAGNOSIS — M3500 Sicca syndrome, unspecified: Secondary | ICD-10-CM | POA: Diagnosis not present

## 2023-08-25 DIAGNOSIS — E871 Hypo-osmolality and hyponatremia: Secondary | ICD-10-CM | POA: Diagnosis not present

## 2023-08-25 DIAGNOSIS — I10 Essential (primary) hypertension: Secondary | ICD-10-CM | POA: Diagnosis not present

## 2023-08-25 DIAGNOSIS — E210 Primary hyperparathyroidism: Secondary | ICD-10-CM | POA: Diagnosis not present

## 2023-08-25 DIAGNOSIS — K219 Gastro-esophageal reflux disease without esophagitis: Secondary | ICD-10-CM | POA: Diagnosis not present

## 2023-08-25 DIAGNOSIS — Z8673 Personal history of transient ischemic attack (TIA), and cerebral infarction without residual deficits: Secondary | ICD-10-CM | POA: Diagnosis not present

## 2023-08-25 DIAGNOSIS — Z556 Problems related to health literacy: Secondary | ICD-10-CM | POA: Diagnosis not present

## 2023-08-25 DIAGNOSIS — Z9181 History of falling: Secondary | ICD-10-CM | POA: Diagnosis not present

## 2023-08-25 DIAGNOSIS — S065X0D Traumatic subdural hemorrhage without loss of consciousness, subsequent encounter: Secondary | ICD-10-CM | POA: Diagnosis not present

## 2023-08-25 DIAGNOSIS — E785 Hyperlipidemia, unspecified: Secondary | ICD-10-CM | POA: Diagnosis not present

## 2023-08-27 DIAGNOSIS — E46 Unspecified protein-calorie malnutrition: Secondary | ICD-10-CM | POA: Diagnosis not present

## 2023-08-27 DIAGNOSIS — E7849 Other hyperlipidemia: Secondary | ICD-10-CM | POA: Diagnosis not present

## 2023-08-27 DIAGNOSIS — R5383 Other fatigue: Secondary | ICD-10-CM | POA: Diagnosis not present

## 2023-08-27 DIAGNOSIS — R300 Dysuria: Secondary | ICD-10-CM | POA: Diagnosis not present

## 2023-08-27 DIAGNOSIS — E559 Vitamin D deficiency, unspecified: Secondary | ICD-10-CM | POA: Diagnosis not present

## 2023-08-27 DIAGNOSIS — I10 Essential (primary) hypertension: Secondary | ICD-10-CM | POA: Diagnosis not present

## 2023-08-31 DIAGNOSIS — E871 Hypo-osmolality and hyponatremia: Secondary | ICD-10-CM | POA: Diagnosis not present

## 2023-08-31 DIAGNOSIS — Z9181 History of falling: Secondary | ICD-10-CM | POA: Diagnosis not present

## 2023-08-31 DIAGNOSIS — S065X0D Traumatic subdural hemorrhage without loss of consciousness, subsequent encounter: Secondary | ICD-10-CM | POA: Diagnosis not present

## 2023-08-31 DIAGNOSIS — E7800 Pure hypercholesterolemia, unspecified: Secondary | ICD-10-CM | POA: Diagnosis not present

## 2023-08-31 DIAGNOSIS — K5909 Other constipation: Secondary | ICD-10-CM | POA: Diagnosis not present

## 2023-08-31 DIAGNOSIS — E210 Primary hyperparathyroidism: Secondary | ICD-10-CM | POA: Diagnosis not present

## 2023-08-31 DIAGNOSIS — K219 Gastro-esophageal reflux disease without esophagitis: Secondary | ICD-10-CM | POA: Diagnosis not present

## 2023-08-31 DIAGNOSIS — M3500 Sicca syndrome, unspecified: Secondary | ICD-10-CM | POA: Diagnosis not present

## 2023-08-31 DIAGNOSIS — I10 Essential (primary) hypertension: Secondary | ICD-10-CM | POA: Diagnosis not present

## 2023-08-31 DIAGNOSIS — Z556 Problems related to health literacy: Secondary | ICD-10-CM | POA: Diagnosis not present

## 2023-08-31 DIAGNOSIS — E785 Hyperlipidemia, unspecified: Secondary | ICD-10-CM | POA: Diagnosis not present

## 2023-08-31 DIAGNOSIS — Z8673 Personal history of transient ischemic attack (TIA), and cerebral infarction without residual deficits: Secondary | ICD-10-CM | POA: Diagnosis not present

## 2023-09-02 DIAGNOSIS — S065X0A Traumatic subdural hemorrhage without loss of consciousness, initial encounter: Secondary | ICD-10-CM | POA: Diagnosis not present

## 2023-09-02 DIAGNOSIS — S065XAD Traumatic subdural hemorrhage with loss of consciousness status unknown, subsequent encounter: Secondary | ICD-10-CM | POA: Diagnosis not present

## 2023-09-02 DIAGNOSIS — Z9889 Other specified postprocedural states: Secondary | ICD-10-CM | POA: Diagnosis not present

## 2023-09-03 DIAGNOSIS — E213 Hyperparathyroidism, unspecified: Secondary | ICD-10-CM | POA: Diagnosis not present

## 2023-09-03 DIAGNOSIS — E785 Hyperlipidemia, unspecified: Secondary | ICD-10-CM | POA: Diagnosis not present

## 2023-09-03 DIAGNOSIS — E871 Hypo-osmolality and hyponatremia: Secondary | ICD-10-CM | POA: Diagnosis not present

## 2023-09-03 DIAGNOSIS — E7800 Pure hypercholesterolemia, unspecified: Secondary | ICD-10-CM | POA: Diagnosis not present

## 2023-09-03 DIAGNOSIS — I10 Essential (primary) hypertension: Secondary | ICD-10-CM | POA: Diagnosis not present

## 2023-09-03 DIAGNOSIS — Z9181 History of falling: Secondary | ICD-10-CM | POA: Diagnosis not present

## 2023-09-03 DIAGNOSIS — S065X0D Traumatic subdural hemorrhage without loss of consciousness, subsequent encounter: Secondary | ICD-10-CM | POA: Diagnosis not present

## 2023-09-03 DIAGNOSIS — Z681 Body mass index (BMI) 19 or less, adult: Secondary | ICD-10-CM | POA: Diagnosis not present

## 2023-09-03 DIAGNOSIS — K219 Gastro-esophageal reflux disease without esophagitis: Secondary | ICD-10-CM | POA: Diagnosis not present

## 2023-09-03 DIAGNOSIS — M3500 Sicca syndrome, unspecified: Secondary | ICD-10-CM | POA: Diagnosis not present

## 2023-09-03 DIAGNOSIS — Z556 Problems related to health literacy: Secondary | ICD-10-CM | POA: Diagnosis not present

## 2023-09-03 DIAGNOSIS — E210 Primary hyperparathyroidism: Secondary | ICD-10-CM | POA: Diagnosis not present

## 2023-09-03 DIAGNOSIS — Z8673 Personal history of transient ischemic attack (TIA), and cerebral infarction without residual deficits: Secondary | ICD-10-CM | POA: Diagnosis not present

## 2023-09-03 DIAGNOSIS — K5909 Other constipation: Secondary | ICD-10-CM | POA: Diagnosis not present

## 2023-09-07 DIAGNOSIS — E785 Hyperlipidemia, unspecified: Secondary | ICD-10-CM | POA: Diagnosis not present

## 2023-09-07 DIAGNOSIS — E871 Hypo-osmolality and hyponatremia: Secondary | ICD-10-CM | POA: Diagnosis not present

## 2023-09-07 DIAGNOSIS — Z556 Problems related to health literacy: Secondary | ICD-10-CM | POA: Diagnosis not present

## 2023-09-07 DIAGNOSIS — Z8673 Personal history of transient ischemic attack (TIA), and cerebral infarction without residual deficits: Secondary | ICD-10-CM | POA: Diagnosis not present

## 2023-09-07 DIAGNOSIS — E210 Primary hyperparathyroidism: Secondary | ICD-10-CM | POA: Diagnosis not present

## 2023-09-07 DIAGNOSIS — Z9181 History of falling: Secondary | ICD-10-CM | POA: Diagnosis not present

## 2023-09-07 DIAGNOSIS — K5909 Other constipation: Secondary | ICD-10-CM | POA: Diagnosis not present

## 2023-09-07 DIAGNOSIS — I10 Essential (primary) hypertension: Secondary | ICD-10-CM | POA: Diagnosis not present

## 2023-09-07 DIAGNOSIS — K219 Gastro-esophageal reflux disease without esophagitis: Secondary | ICD-10-CM | POA: Diagnosis not present

## 2023-09-07 DIAGNOSIS — M3500 Sicca syndrome, unspecified: Secondary | ICD-10-CM | POA: Diagnosis not present

## 2023-09-07 DIAGNOSIS — S065X0D Traumatic subdural hemorrhage without loss of consciousness, subsequent encounter: Secondary | ICD-10-CM | POA: Diagnosis not present

## 2023-09-07 DIAGNOSIS — E7800 Pure hypercholesterolemia, unspecified: Secondary | ICD-10-CM | POA: Diagnosis not present

## 2023-09-09 DIAGNOSIS — S065XAA Traumatic subdural hemorrhage with loss of consciousness status unknown, initial encounter: Secondary | ICD-10-CM | POA: Diagnosis not present

## 2023-09-10 DIAGNOSIS — Z8673 Personal history of transient ischemic attack (TIA), and cerebral infarction without residual deficits: Secondary | ICD-10-CM | POA: Diagnosis not present

## 2023-09-10 DIAGNOSIS — E871 Hypo-osmolality and hyponatremia: Secondary | ICD-10-CM | POA: Diagnosis not present

## 2023-09-10 DIAGNOSIS — E785 Hyperlipidemia, unspecified: Secondary | ICD-10-CM | POA: Diagnosis not present

## 2023-09-10 DIAGNOSIS — Z9181 History of falling: Secondary | ICD-10-CM | POA: Diagnosis not present

## 2023-09-10 DIAGNOSIS — K219 Gastro-esophageal reflux disease without esophagitis: Secondary | ICD-10-CM | POA: Diagnosis not present

## 2023-09-10 DIAGNOSIS — E210 Primary hyperparathyroidism: Secondary | ICD-10-CM | POA: Diagnosis not present

## 2023-09-10 DIAGNOSIS — S065X0D Traumatic subdural hemorrhage without loss of consciousness, subsequent encounter: Secondary | ICD-10-CM | POA: Diagnosis not present

## 2023-09-10 DIAGNOSIS — I10 Essential (primary) hypertension: Secondary | ICD-10-CM | POA: Diagnosis not present

## 2023-09-10 DIAGNOSIS — K5909 Other constipation: Secondary | ICD-10-CM | POA: Diagnosis not present

## 2023-09-10 DIAGNOSIS — E7800 Pure hypercholesterolemia, unspecified: Secondary | ICD-10-CM | POA: Diagnosis not present

## 2023-09-10 DIAGNOSIS — Z556 Problems related to health literacy: Secondary | ICD-10-CM | POA: Diagnosis not present

## 2023-09-10 DIAGNOSIS — M3500 Sicca syndrome, unspecified: Secondary | ICD-10-CM | POA: Diagnosis not present

## 2023-09-15 DIAGNOSIS — Z556 Problems related to health literacy: Secondary | ICD-10-CM | POA: Diagnosis not present

## 2023-09-15 DIAGNOSIS — E871 Hypo-osmolality and hyponatremia: Secondary | ICD-10-CM | POA: Diagnosis not present

## 2023-09-15 DIAGNOSIS — Z9181 History of falling: Secondary | ICD-10-CM | POA: Diagnosis not present

## 2023-09-15 DIAGNOSIS — I10 Essential (primary) hypertension: Secondary | ICD-10-CM | POA: Diagnosis not present

## 2023-09-15 DIAGNOSIS — Z8673 Personal history of transient ischemic attack (TIA), and cerebral infarction without residual deficits: Secondary | ICD-10-CM | POA: Diagnosis not present

## 2023-09-15 DIAGNOSIS — S065XAA Traumatic subdural hemorrhage with loss of consciousness status unknown, initial encounter: Secondary | ICD-10-CM | POA: Diagnosis not present

## 2023-09-15 DIAGNOSIS — S065X0D Traumatic subdural hemorrhage without loss of consciousness, subsequent encounter: Secondary | ICD-10-CM | POA: Diagnosis not present

## 2023-09-15 DIAGNOSIS — E785 Hyperlipidemia, unspecified: Secondary | ICD-10-CM | POA: Diagnosis not present

## 2023-09-15 DIAGNOSIS — E210 Primary hyperparathyroidism: Secondary | ICD-10-CM | POA: Diagnosis not present

## 2023-09-15 DIAGNOSIS — M3500 Sicca syndrome, unspecified: Secondary | ICD-10-CM | POA: Diagnosis not present

## 2023-09-15 DIAGNOSIS — K5909 Other constipation: Secondary | ICD-10-CM | POA: Diagnosis not present

## 2023-09-15 DIAGNOSIS — K219 Gastro-esophageal reflux disease without esophagitis: Secondary | ICD-10-CM | POA: Diagnosis not present

## 2023-09-15 DIAGNOSIS — E7800 Pure hypercholesterolemia, unspecified: Secondary | ICD-10-CM | POA: Diagnosis not present

## 2023-09-20 DIAGNOSIS — Z880 Allergy status to penicillin: Secondary | ICD-10-CM | POA: Diagnosis not present

## 2023-09-20 DIAGNOSIS — S8002XA Contusion of left knee, initial encounter: Secondary | ICD-10-CM | POA: Diagnosis not present

## 2023-09-20 DIAGNOSIS — W19XXXA Unspecified fall, initial encounter: Secondary | ICD-10-CM | POA: Diagnosis not present

## 2023-09-20 DIAGNOSIS — E213 Hyperparathyroidism, unspecified: Secondary | ICD-10-CM | POA: Diagnosis not present

## 2023-09-20 DIAGNOSIS — N281 Cyst of kidney, acquired: Secondary | ICD-10-CM | POA: Diagnosis not present

## 2023-09-20 DIAGNOSIS — S8992XA Unspecified injury of left lower leg, initial encounter: Secondary | ICD-10-CM | POA: Diagnosis not present

## 2023-09-20 DIAGNOSIS — S0990XA Unspecified injury of head, initial encounter: Secondary | ICD-10-CM | POA: Diagnosis not present

## 2023-09-20 DIAGNOSIS — S80211A Abrasion, right knee, initial encounter: Secondary | ICD-10-CM | POA: Diagnosis not present

## 2023-09-20 DIAGNOSIS — R1084 Generalized abdominal pain: Secondary | ICD-10-CM | POA: Diagnosis not present

## 2023-09-20 DIAGNOSIS — S8991XA Unspecified injury of right lower leg, initial encounter: Secondary | ICD-10-CM | POA: Diagnosis not present

## 2023-09-20 DIAGNOSIS — S0083XA Contusion of other part of head, initial encounter: Secondary | ICD-10-CM | POA: Diagnosis not present

## 2023-09-20 DIAGNOSIS — M3500 Sicca syndrome, unspecified: Secondary | ICD-10-CM | POA: Diagnosis not present

## 2023-09-20 DIAGNOSIS — S80212A Abrasion, left knee, initial encounter: Secondary | ICD-10-CM | POA: Diagnosis not present

## 2023-09-20 DIAGNOSIS — R410 Disorientation, unspecified: Secondary | ICD-10-CM | POA: Diagnosis not present

## 2023-09-20 DIAGNOSIS — Z881 Allergy status to other antibiotic agents status: Secondary | ICD-10-CM | POA: Diagnosis not present

## 2023-09-20 DIAGNOSIS — Z882 Allergy status to sulfonamides status: Secondary | ICD-10-CM | POA: Diagnosis not present

## 2023-09-20 DIAGNOSIS — S299XXA Unspecified injury of thorax, initial encounter: Secondary | ICD-10-CM | POA: Diagnosis not present

## 2023-09-20 DIAGNOSIS — S3991XA Unspecified injury of abdomen, initial encounter: Secondary | ICD-10-CM | POA: Diagnosis not present

## 2023-09-20 DIAGNOSIS — S199XXA Unspecified injury of neck, initial encounter: Secondary | ICD-10-CM | POA: Diagnosis not present

## 2023-09-20 DIAGNOSIS — S301XXA Contusion of abdominal wall, initial encounter: Secondary | ICD-10-CM | POA: Diagnosis not present

## 2023-09-20 DIAGNOSIS — S8001XA Contusion of right knee, initial encounter: Secondary | ICD-10-CM | POA: Diagnosis not present

## 2023-09-20 DIAGNOSIS — M25462 Effusion, left knee: Secondary | ICD-10-CM | POA: Diagnosis not present

## 2023-09-20 DIAGNOSIS — M47812 Spondylosis without myelopathy or radiculopathy, cervical region: Secondary | ICD-10-CM | POA: Diagnosis not present

## 2023-09-20 DIAGNOSIS — S3993XA Unspecified injury of pelvis, initial encounter: Secondary | ICD-10-CM | POA: Diagnosis not present

## 2023-09-20 DIAGNOSIS — Z743 Need for continuous supervision: Secondary | ICD-10-CM | POA: Diagnosis not present

## 2023-09-20 DIAGNOSIS — M1990 Unspecified osteoarthritis, unspecified site: Secondary | ICD-10-CM | POA: Diagnosis not present

## 2023-09-23 ENCOUNTER — Encounter: Payer: Self-pay | Admitting: Physical Medicine and Rehabilitation

## 2023-09-23 ENCOUNTER — Encounter: Attending: Physical Medicine and Rehabilitation | Admitting: Physical Medicine and Rehabilitation

## 2023-09-23 VITALS — BP 138/78 | HR 80 | Ht 60.0 in | Wt 94.2 lb

## 2023-09-23 DIAGNOSIS — E871 Hypo-osmolality and hyponatremia: Secondary | ICD-10-CM | POA: Diagnosis not present

## 2023-09-23 DIAGNOSIS — K5909 Other constipation: Secondary | ICD-10-CM | POA: Diagnosis not present

## 2023-09-23 DIAGNOSIS — E210 Primary hyperparathyroidism: Secondary | ICD-10-CM | POA: Diagnosis not present

## 2023-09-23 DIAGNOSIS — R296 Repeated falls: Secondary | ICD-10-CM | POA: Diagnosis not present

## 2023-09-23 DIAGNOSIS — R413 Other amnesia: Secondary | ICD-10-CM | POA: Diagnosis not present

## 2023-09-23 DIAGNOSIS — E785 Hyperlipidemia, unspecified: Secondary | ICD-10-CM | POA: Diagnosis not present

## 2023-09-23 DIAGNOSIS — Z556 Problems related to health literacy: Secondary | ICD-10-CM | POA: Diagnosis not present

## 2023-09-23 DIAGNOSIS — K219 Gastro-esophageal reflux disease without esophagitis: Secondary | ICD-10-CM | POA: Diagnosis not present

## 2023-09-23 DIAGNOSIS — S065X0D Traumatic subdural hemorrhage without loss of consciousness, subsequent encounter: Secondary | ICD-10-CM | POA: Diagnosis not present

## 2023-09-23 DIAGNOSIS — Z9181 History of falling: Secondary | ICD-10-CM | POA: Diagnosis not present

## 2023-09-23 DIAGNOSIS — Z8673 Personal history of transient ischemic attack (TIA), and cerebral infarction without residual deficits: Secondary | ICD-10-CM | POA: Diagnosis not present

## 2023-09-23 DIAGNOSIS — E7800 Pure hypercholesterolemia, unspecified: Secondary | ICD-10-CM | POA: Diagnosis not present

## 2023-09-23 DIAGNOSIS — F05 Delirium due to known physiological condition: Secondary | ICD-10-CM | POA: Diagnosis present

## 2023-09-23 DIAGNOSIS — I10 Essential (primary) hypertension: Secondary | ICD-10-CM | POA: Diagnosis not present

## 2023-09-23 DIAGNOSIS — M3500 Sicca syndrome, unspecified: Secondary | ICD-10-CM | POA: Diagnosis not present

## 2023-09-23 NOTE — Progress Notes (Signed)
 Subjective:    Patient ID: Peggy Ramirez, female    DOB: 1934-03-15, 89 y.o.   MRN: [id number]  HPI  Peggy Ramirez is a 89 y.o. year old female  who  has a past medical history of Acute metabolic encephalopathy, Chronic constipation, Chronic left lower quadrant pain, GERD (gastroesophageal reflux disease), Headache(784.0), High cholesterol, Hydrosalpinx, Hypertension, Lupus, Membranous glomerulonephritis, Ovarian fibroma, Pelvic adhesions, Sjogren's syndrome (HCC), and Stroke (HCC).   They are presenting to PM&R clinic for follow up related to recurrent SDH s/p fall no LOC + plavix , now s/p MMA embolization  08/16/23.  Plan from last visit: Traumatic subdural hematoma: Neurosurgery Following.  , History of CVA: Continue to Monitor: PCP Following. Continue to Monitor.   SIADH,: Continue Sodium Tablets. She will F/U with PCP. Continue to Monitor.   Essential Hypertension: Continue current medication regimen. PCP following. Continue to Monitor.   Hyperlipidemia. Continue current medication regimen. PCP following. Continue to Monitor.    F/U in 4- 6 weeks with Dr Dorn Gaskins    Interval Hx:  - Therapies: She has had in home therapies and it is going OK; not making much progress due to falls. Does HEP when guided by family. Was discharged from [facility] PT today.   She leans to the left when she walks.    - Follow ups: MMA embolization 08/06/23 with Dr. Nat Badger; no complications, followed up and was told not to resume plavix . Then discharged her.    - Falls: Marvell Slider 1 month ago, witnessed, hit her face and was bruised. Fall with ER visit 09/20/23, unwitnessed and found on the porch, with trauma scans negative.   ER doctor recommended placement; they discussed with social services OP placement. They recommended we complete an FL-2 for her and    - DME: When she walks with a walker she holds it too far out in front of her; when she uses a cane she is too unsteady. She holds onto   - Medications:  Still using alprazolam  with mild benefit; has not needed seroquel . Still having significant anxiety. Takes trazodone  and 5 mg melatonin at nighttime - does not sleep and gets more anxious and confused in the evenings.   Salt tabs - ER visit 5/25 with Na 142 - was taken off of it by Dr. Marykay Snipes.   - Other concerns: Family is helping her with all ADLs including bowel/ladder management, bathing, dressing.  Pain Inventory Average Pain 0 Pain Right Now 0 My pain is na  In the last 24 hours, has pain interfered with the following? General activity 0 Relation with others 0 Enjoyment of life 0 What TIME of day is your pain at its worst? na Sleep (in general) Poor  Pain is worse with: no pain Pain improves with: no pain Relief from Meds: n/a  Family History  Problem Relation Age of Onset   Heart disease Other    Anxiety disorder Other    Depression Other    Social History   Socioeconomic History   Marital status: Widowed    Spouse name: Not on file   Number of children: 4   Years of education: 12   Highest education level: Not on file  Occupational History   Occupation: retired    Associate Professor: Occupational hygienist  Tobacco Use   Smoking status: Never   Smokeless tobacco: Never  Vaping Use   Vaping status: Never Used  Substance and Sexual Activity   Alcohol  use: No    Alcohol /week: 0.0 standard drinks  of alcohol    Drug use: Never   Sexual activity: Not Currently    Birth control/protection: Surgical    Comment: Hysterectomy  Other Topics Concern   Not on file  Social History Narrative   Patient lives at home alone.   Caffeine  Use: 1-2 cups daily   Right handed    Social Drivers of Health   Financial Resource Strain: Not on file  Food Insecurity: No Food Insecurity (07/05/2023)   Hunger Vital Sign    Worried About Running Out of Food in the Last Year: Never true    Ran Out of Food in the Last Year: Never true  Transportation Needs: No Transportation Needs (07/05/2023)    PRAPARE - Administrator, Civil Service (Medical): No    Lack of Transportation (Non-Medical): No  Physical Activity: Not on file  Stress: Not on file  Social Connections: Moderately Integrated (07/06/2023)   Social Connection and Isolation Panel [NHANES]    Frequency of Communication with Friends and Family: Twice a week    Frequency of Social Gatherings with Friends and Family: More than three times a week    Attends Religious Services: More than 4 times per year    Active Member of [employer] or Organizations: Yes    Attends Banker Meetings: More than 4 times per year    Marital Status: Widowed   Past Surgical History:  Procedure Laterality Date   APPENDECTOMY  04/28/2000   BIOPSY  12/17/2020   Procedure: BIOPSY;  Surgeon: Vinetta Greening, DO;  Location: [facility];  Service: Endoscopy;;   COLONOSCOPY WITH PROPOFOL  N/A 12/17/2020   Procedure: COLONOSCOPY WITH PROPOFOL ;  Surgeon: Vinetta Greening, DO;  Location: [facility];  Service: Endoscopy;  Laterality: N/A;   EYE SURGERY Bilateral 2024   cataracts   FRACTURE SURGERY     HIP PINNING,CANNULATED Left 12/15/2019   Procedure: CANNULATED HIP PINNING;  Surgeon: Laneta Pintos, MD;  Location: [facility] OR;  Service: Orthopedics;  Laterality: Left;   IR ANGIO EXTERNAL CAROTID SEL EXT CAROTID UNI R MOD SED  08/13/2023   IR ANGIO INTRA EXTRACRAN SEL INTERNAL CAROTID UNI R MOD SED  08/06/2023   IR ANGIOGRAM FOLLOW UP STUDY  08/13/2023   IR NEURO EACH ADD'L AFTER BASIC UNI RIGHT (MS)  08/06/2023   IR TRANSCATH/EMBOLIZ  08/06/2023   LAPAROSCOPIC CHOLECYSTECTOMY  04/28/2000   RADIOLOGY WITH ANESTHESIA N/A 08/06/2023   Procedure: RADIOLOGY WITH ANESTHESIA;  Surgeon: Augusto Blonder, MD;  Location: [facility] OR;  Service: Radiology;  Laterality: N/A;  Right middle meningeal artery embolization   TONSILLECTOMY  04/29/1939   TOTAL ABDOMINAL HYSTERECTOMY W/ BILATERAL SALPINGOOPHORECTOMY  04/29/2003   VAGINAL HYSTERECTOMY   04/29/1979   Past Surgical History:  Procedure Laterality Date   APPENDECTOMY  04/28/2000   BIOPSY  12/17/2020   Procedure: BIOPSY;  Surgeon: Vinetta Greening, DO;  [facility];  Service: Endoscopy;;   COLONOSCOPY WITH PROPOFOL  N/A 12/17/2020   Procedure: COLONOSCOPY WITH PROPOFOL ;  Surgeon: Vinetta Greening, DO;  [facility]: [facility];  Service: Endoscopy;  Laterality: N/A;   EYE SURGERY Bilateral 2024   cataracts   FRACTURE SURGERY     HIP PINNING,CANNULATED Left 12/15/2019   Procedure: CANNULATED HIP PINNING;  Surgeon: Laneta Pintos, MD;  Location: [facility];  Service: Orthopedics;  Laterality: Left;   IR ANGIO EXTERNAL CAROTID SEL EXT CAROTID UNI R MOD SED  08/13/2023   IR ANGIO INTRA EXTRACRAN SEL INTERNAL CAROTID  UNI R MOD SED  08/06/2023   IR ANGIOGRAM FOLLOW UP STUDY  08/13/2023   IR NEURO EACH ADD'L AFTER BASIC UNI RIGHT (MS)  08/06/2023   IR TRANSCATH/EMBOLIZ  08/06/2023   LAPAROSCOPIC CHOLECYSTECTOMY  04/28/2000   RADIOLOGY WITH ANESTHESIA N/A 08/06/2023   Procedure: RADIOLOGY WITH ANESTHESIA;  Surgeon: Augusto Blonder, MD;  Location: [facility] OR;  Service: Radiology;  Laterality: N/A;  Right middle meningeal artery embolization   TONSILLECTOMY  04/29/1939   TOTAL ABDOMINAL HYSTERECTOMY W/ BILATERAL SALPINGOOPHORECTOMY  04/29/2003   VAGINAL HYSTERECTOMY  04/29/1979   Past Medical History:  Diagnosis Date   Acute metabolic encephalopathy    Chronic constipation    Chronic left lower quadrant pain    GERD (gastroesophageal reflux disease)    Headache(784.0)    High cholesterol    Hydrosalpinx    LEFT   Hypertension    Lupus    Membranous glomerulonephritis    followed by Dr. Aggie Horton   Ovarian fibroma    RIGHT   Pelvic adhesions    Sjogren's syndrome (HCC)    Stroke (HCC)    There were no vitals taken for this visit.  Opioid Risk Score:   Fall Risk Score:  `1  Depression screen [facility] 2/9     08/05/2023    1:57 PM  Depression screen PHQ 2/9   Decreased Interest 0  Down, Depressed, Hopeless 0  PHQ - 2 Score 0  Altered sleeping 3  Tired, decreased energy 0  Change in appetite 0  Feeling bad or failure about yourself  0  Trouble concentrating 0  Moving slowly or fidgety/restless 0  Suicidal thoughts 0  PHQ-9 Score 3     Review of Systems  Hematological:  Bruises/bleeds easily.  Psychiatric/Behavioral:  Positive for confusion.   All other systems reviewed and are negative.      Objective:   Physical Exam   PE: Constitution: Appropriate appearance for age. No apparent distress  Resp: No respiratory distress. No accessory muscle usage. on RA Cardio: Well perfused appearance. No peripheral edema. Abdomen: Nondistended. Nontender.   Psych: Anxious, restless. Skin: Bruising on L chin, elbow.  Neuro: AAOx to self and "your clinic"; time with cues.  + severe memory deficits + Concentration difficulty   Neurologic Exam:   + LUE > LLE hemineglect  DTRs: Reflexes were 2+ in bilateral achilles, patella, biceps, BR and triceps. Babinsky: flexor responses b/l.   Hoffmans: negative b/l Sensory exam: revealed normal sensation in all dermatomal regions except stocking pattern in bilateral feet to mid-shin Motor exam: strength 4/5 throughout  Coordination: Fine motor coordination was normal.   Gait: Significant leftward lean and inattention; unstable, reliant on +1 assist         Assessment & Plan:   Peggy Ramirez is a 89 y.o. year old female  who  has a past medical history of Acute metabolic encephalopathy, Chronic constipation, Chronic left lower quadrant pain, GERD (gastroesophageal reflux disease), Headache(784.0), High cholesterol, Hydrosalpinx, Hypertension, Lupus, Membranous glomerulonephritis, Ovarian fibroma, Pelvic adhesions, Sjogren's syndrome (HCC), and Stroke (HCC).   They are presenting to PM&R clinic for follow up related to recurrent SDH s/p fall no LOC + plavix , now s/p MMA embolization  08/16/23,  with progressive mobility and cognitive deficits  Traumatic subdural hematoma without loss of consciousness, subsequent encounter Falls frequently Will complete FL-2 and send to [facility] case # [id number] and [email or web address]; I will confirm with # [phone] that he receives this.  I will call you by the end of the week when this is done--completed 5/30, called # for confirmation, left VM.   Follow up in 3 months  Memory deficits Sundowning -     Ambulatory referral to Neurology   I have referred ms. Johannsen to neurology for dementia workup

## 2023-09-23 NOTE — Patient Instructions (Addendum)
 Will complete FL-2 and send to [facility] case # [id number] and fax [email or web address]; I will confirm with # [phone] that he receives this.   I will call you by the end of the week when this is done  I have referred ms. Sobel to neurology for dementia workup  Follow up in 3 months

## 2023-09-24 DIAGNOSIS — H02834 Dermatochalasis of left upper eyelid: Secondary | ICD-10-CM | POA: Diagnosis not present

## 2023-09-24 DIAGNOSIS — H02831 Dermatochalasis of right upper eyelid: Secondary | ICD-10-CM | POA: Diagnosis not present

## 2023-09-24 DIAGNOSIS — H04123 Dry eye syndrome of bilateral lacrimal glands: Secondary | ICD-10-CM | POA: Diagnosis not present

## 2023-09-24 DIAGNOSIS — H11823 Conjunctivochalasis, bilateral: Secondary | ICD-10-CM | POA: Diagnosis not present

## 2023-09-25 DIAGNOSIS — E210 Primary hyperparathyroidism: Secondary | ICD-10-CM | POA: Diagnosis not present

## 2023-09-25 DIAGNOSIS — E871 Hypo-osmolality and hyponatremia: Secondary | ICD-10-CM | POA: Diagnosis not present

## 2023-09-25 DIAGNOSIS — Z556 Problems related to health literacy: Secondary | ICD-10-CM | POA: Diagnosis not present

## 2023-09-25 DIAGNOSIS — E785 Hyperlipidemia, unspecified: Secondary | ICD-10-CM | POA: Diagnosis not present

## 2023-09-25 DIAGNOSIS — Z8673 Personal history of transient ischemic attack (TIA), and cerebral infarction without residual deficits: Secondary | ICD-10-CM | POA: Diagnosis not present

## 2023-09-25 DIAGNOSIS — S065X0D Traumatic subdural hemorrhage without loss of consciousness, subsequent encounter: Secondary | ICD-10-CM | POA: Diagnosis not present

## 2023-09-25 DIAGNOSIS — E7800 Pure hypercholesterolemia, unspecified: Secondary | ICD-10-CM | POA: Diagnosis not present

## 2023-09-25 DIAGNOSIS — M3500 Sicca syndrome, unspecified: Secondary | ICD-10-CM | POA: Diagnosis not present

## 2023-09-25 DIAGNOSIS — I10 Essential (primary) hypertension: Secondary | ICD-10-CM | POA: Diagnosis not present

## 2023-09-25 DIAGNOSIS — K5909 Other constipation: Secondary | ICD-10-CM | POA: Diagnosis not present

## 2023-09-25 DIAGNOSIS — Z9181 History of falling: Secondary | ICD-10-CM | POA: Diagnosis not present

## 2023-09-25 DIAGNOSIS — K219 Gastro-esophageal reflux disease without esophagitis: Secondary | ICD-10-CM | POA: Diagnosis not present

## 2023-09-30 DIAGNOSIS — I959 Hypotension, unspecified: Secondary | ICD-10-CM | POA: Diagnosis not present

## 2023-09-30 DIAGNOSIS — Z880 Allergy status to penicillin: Secondary | ICD-10-CM | POA: Diagnosis not present

## 2023-09-30 DIAGNOSIS — N3000 Acute cystitis without hematuria: Secondary | ICD-10-CM | POA: Diagnosis not present

## 2023-09-30 DIAGNOSIS — Z79899 Other long term (current) drug therapy: Secondary | ICD-10-CM | POA: Diagnosis not present

## 2023-09-30 DIAGNOSIS — R059 Cough, unspecified: Secondary | ICD-10-CM | POA: Diagnosis not present

## 2023-09-30 DIAGNOSIS — I447 Left bundle-branch block, unspecified: Secondary | ICD-10-CM | POA: Diagnosis not present

## 2023-09-30 DIAGNOSIS — E46 Unspecified protein-calorie malnutrition: Secondary | ICD-10-CM | POA: Diagnosis not present

## 2023-09-30 DIAGNOSIS — R296 Repeated falls: Secondary | ICD-10-CM | POA: Diagnosis not present

## 2023-09-30 DIAGNOSIS — R2689 Other abnormalities of gait and mobility: Secondary | ICD-10-CM | POA: Diagnosis not present

## 2023-09-30 DIAGNOSIS — J984 Other disorders of lung: Secondary | ICD-10-CM | POA: Diagnosis not present

## 2023-09-30 DIAGNOSIS — Z743 Need for continuous supervision: Secondary | ICD-10-CM | POA: Diagnosis not present

## 2023-09-30 DIAGNOSIS — M25561 Pain in right knee: Secondary | ICD-10-CM | POA: Diagnosis not present

## 2023-09-30 DIAGNOSIS — R7989 Other specified abnormal findings of blood chemistry: Secondary | ICD-10-CM | POA: Diagnosis not present

## 2023-09-30 DIAGNOSIS — I6200 Nontraumatic subdural hemorrhage, unspecified: Secondary | ICD-10-CM | POA: Diagnosis not present

## 2023-09-30 DIAGNOSIS — S0990XA Unspecified injury of head, initial encounter: Secondary | ICD-10-CM | POA: Diagnosis not present

## 2023-09-30 DIAGNOSIS — R9431 Abnormal electrocardiogram [ECG] [EKG]: Secondary | ICD-10-CM | POA: Diagnosis not present

## 2023-09-30 DIAGNOSIS — I10 Essential (primary) hypertension: Secondary | ICD-10-CM | POA: Diagnosis not present

## 2023-09-30 DIAGNOSIS — M329 Systemic lupus erythematosus, unspecified: Secondary | ICD-10-CM | POA: Diagnosis not present

## 2023-09-30 DIAGNOSIS — Z881 Allergy status to other antibiotic agents status: Secondary | ICD-10-CM | POA: Diagnosis not present

## 2023-09-30 DIAGNOSIS — R079 Chest pain, unspecified: Secondary | ICD-10-CM | POA: Diagnosis not present

## 2023-09-30 DIAGNOSIS — M25521 Pain in right elbow: Secondary | ICD-10-CM | POA: Diagnosis not present

## 2023-09-30 DIAGNOSIS — Z791 Long term (current) use of non-steroidal anti-inflammatories (NSAID): Secondary | ICD-10-CM | POA: Diagnosis not present

## 2023-09-30 DIAGNOSIS — S199XXA Unspecified injury of neck, initial encounter: Secondary | ICD-10-CM | POA: Diagnosis not present

## 2023-09-30 DIAGNOSIS — R058 Other specified cough: Secondary | ICD-10-CM | POA: Diagnosis not present

## 2023-09-30 DIAGNOSIS — I351 Nonrheumatic aortic (valve) insufficiency: Secondary | ICD-10-CM | POA: Diagnosis not present

## 2023-09-30 DIAGNOSIS — R918 Other nonspecific abnormal finding of lung field: Secondary | ICD-10-CM | POA: Diagnosis not present

## 2023-09-30 DIAGNOSIS — M47812 Spondylosis without myelopathy or radiculopathy, cervical region: Secondary | ICD-10-CM | POA: Diagnosis not present

## 2023-09-30 DIAGNOSIS — G9341 Metabolic encephalopathy: Secondary | ICD-10-CM | POA: Diagnosis not present

## 2023-09-30 DIAGNOSIS — G309 Alzheimer's disease, unspecified: Secondary | ICD-10-CM | POA: Diagnosis not present

## 2023-09-30 DIAGNOSIS — M1711 Unilateral primary osteoarthritis, right knee: Secondary | ICD-10-CM | POA: Diagnosis not present

## 2023-09-30 DIAGNOSIS — Z882 Allergy status to sulfonamides status: Secondary | ICD-10-CM | POA: Diagnosis not present

## 2023-09-30 DIAGNOSIS — W19XXXA Unspecified fall, initial encounter: Secondary | ICD-10-CM | POA: Diagnosis not present

## 2023-09-30 DIAGNOSIS — R6889 Other general symptoms and signs: Secondary | ICD-10-CM | POA: Diagnosis not present

## 2023-10-01 DIAGNOSIS — M329 Systemic lupus erythematosus, unspecified: Secondary | ICD-10-CM | POA: Diagnosis not present

## 2023-10-01 DIAGNOSIS — I959 Hypotension, unspecified: Secondary | ICD-10-CM | POA: Diagnosis not present

## 2023-10-01 DIAGNOSIS — I10 Essential (primary) hypertension: Secondary | ICD-10-CM | POA: Diagnosis not present

## 2023-10-01 DIAGNOSIS — R079 Chest pain, unspecified: Secondary | ICD-10-CM | POA: Diagnosis not present

## 2023-10-01 DIAGNOSIS — Z882 Allergy status to sulfonamides status: Secondary | ICD-10-CM | POA: Diagnosis not present

## 2023-10-01 DIAGNOSIS — I6200 Nontraumatic subdural hemorrhage, unspecified: Secondary | ICD-10-CM | POA: Diagnosis not present

## 2023-10-01 DIAGNOSIS — Z791 Long term (current) use of non-steroidal anti-inflammatories (NSAID): Secondary | ICD-10-CM | POA: Diagnosis not present

## 2023-10-01 DIAGNOSIS — N3000 Acute cystitis without hematuria: Secondary | ICD-10-CM | POA: Diagnosis not present

## 2023-10-01 DIAGNOSIS — G309 Alzheimer's disease, unspecified: Secondary | ICD-10-CM | POA: Diagnosis not present

## 2023-10-01 DIAGNOSIS — I351 Nonrheumatic aortic (valve) insufficiency: Secondary | ICD-10-CM | POA: Diagnosis not present

## 2023-10-01 DIAGNOSIS — Z881 Allergy status to other antibiotic agents status: Secondary | ICD-10-CM | POA: Diagnosis not present

## 2023-10-01 DIAGNOSIS — G9341 Metabolic encephalopathy: Secondary | ICD-10-CM | POA: Diagnosis not present

## 2023-10-01 DIAGNOSIS — R9431 Abnormal electrocardiogram [ECG] [EKG]: Secondary | ICD-10-CM | POA: Diagnosis not present

## 2023-10-01 DIAGNOSIS — R1310 Dysphagia, unspecified: Secondary | ICD-10-CM | POA: Diagnosis not present

## 2023-10-01 DIAGNOSIS — R2689 Other abnormalities of gait and mobility: Secondary | ICD-10-CM | POA: Diagnosis not present

## 2023-10-01 DIAGNOSIS — Z79899 Other long term (current) drug therapy: Secondary | ICD-10-CM | POA: Diagnosis not present

## 2023-10-01 DIAGNOSIS — Z880 Allergy status to penicillin: Secondary | ICD-10-CM | POA: Diagnosis not present

## 2023-10-01 DIAGNOSIS — R296 Repeated falls: Secondary | ICD-10-CM | POA: Diagnosis not present

## 2023-10-01 DIAGNOSIS — J984 Other disorders of lung: Secondary | ICD-10-CM | POA: Diagnosis not present

## 2023-10-01 DIAGNOSIS — R7989 Other specified abnormal findings of blood chemistry: Secondary | ICD-10-CM | POA: Diagnosis not present

## 2023-10-01 DIAGNOSIS — I447 Left bundle-branch block, unspecified: Secondary | ICD-10-CM | POA: Diagnosis not present

## 2023-10-01 DIAGNOSIS — S0990XA Unspecified injury of head, initial encounter: Secondary | ICD-10-CM | POA: Diagnosis not present

## 2023-10-01 DIAGNOSIS — E46 Unspecified protein-calorie malnutrition: Secondary | ICD-10-CM | POA: Diagnosis not present

## 2023-10-02 DIAGNOSIS — G309 Alzheimer's disease, unspecified: Secondary | ICD-10-CM | POA: Diagnosis not present

## 2023-10-02 DIAGNOSIS — R5381 Other malaise: Secondary | ICD-10-CM | POA: Diagnosis not present

## 2023-10-02 DIAGNOSIS — I10 Essential (primary) hypertension: Secondary | ICD-10-CM | POA: Diagnosis not present

## 2023-10-02 DIAGNOSIS — R079 Chest pain, unspecified: Secondary | ICD-10-CM | POA: Diagnosis not present

## 2023-10-02 DIAGNOSIS — R296 Repeated falls: Secondary | ICD-10-CM | POA: Diagnosis not present

## 2023-10-02 DIAGNOSIS — R7989 Other specified abnormal findings of blood chemistry: Secondary | ICD-10-CM | POA: Diagnosis not present

## 2023-10-02 DIAGNOSIS — N3000 Acute cystitis without hematuria: Secondary | ICD-10-CM | POA: Diagnosis not present

## 2023-10-02 DIAGNOSIS — Z79899 Other long term (current) drug therapy: Secondary | ICD-10-CM | POA: Diagnosis not present

## 2023-10-02 DIAGNOSIS — Z792 Long term (current) use of antibiotics: Secondary | ICD-10-CM | POA: Diagnosis not present

## 2023-10-03 DIAGNOSIS — J984 Other disorders of lung: Secondary | ICD-10-CM | POA: Diagnosis not present

## 2023-10-03 DIAGNOSIS — R9431 Abnormal electrocardiogram [ECG] [EKG]: Secondary | ICD-10-CM | POA: Diagnosis not present

## 2023-10-03 DIAGNOSIS — S0990XA Unspecified injury of head, initial encounter: Secondary | ICD-10-CM | POA: Diagnosis not present

## 2023-10-03 DIAGNOSIS — R296 Repeated falls: Secondary | ICD-10-CM | POA: Diagnosis not present

## 2023-10-03 DIAGNOSIS — E46 Unspecified protein-calorie malnutrition: Secondary | ICD-10-CM | POA: Diagnosis not present

## 2023-10-03 DIAGNOSIS — Z791 Long term (current) use of non-steroidal anti-inflammatories (NSAID): Secondary | ICD-10-CM | POA: Diagnosis not present

## 2023-10-03 DIAGNOSIS — I6200 Nontraumatic subdural hemorrhage, unspecified: Secondary | ICD-10-CM | POA: Diagnosis not present

## 2023-10-03 DIAGNOSIS — N3000 Acute cystitis without hematuria: Secondary | ICD-10-CM | POA: Diagnosis not present

## 2023-10-03 DIAGNOSIS — I351 Nonrheumatic aortic (valve) insufficiency: Secondary | ICD-10-CM | POA: Diagnosis not present

## 2023-10-03 DIAGNOSIS — R5381 Other malaise: Secondary | ICD-10-CM | POA: Diagnosis not present

## 2023-10-03 DIAGNOSIS — Z79899 Other long term (current) drug therapy: Secondary | ICD-10-CM | POA: Diagnosis not present

## 2023-10-03 DIAGNOSIS — I447 Left bundle-branch block, unspecified: Secondary | ICD-10-CM | POA: Diagnosis not present

## 2023-10-03 DIAGNOSIS — R2689 Other abnormalities of gait and mobility: Secondary | ICD-10-CM | POA: Diagnosis not present

## 2023-10-03 DIAGNOSIS — G309 Alzheimer's disease, unspecified: Secondary | ICD-10-CM | POA: Diagnosis not present

## 2023-10-03 DIAGNOSIS — Z880 Allergy status to penicillin: Secondary | ICD-10-CM | POA: Diagnosis not present

## 2023-10-03 DIAGNOSIS — Z881 Allergy status to other antibiotic agents status: Secondary | ICD-10-CM | POA: Diagnosis not present

## 2023-10-03 DIAGNOSIS — I959 Hypotension, unspecified: Secondary | ICD-10-CM | POA: Diagnosis not present

## 2023-10-03 DIAGNOSIS — Z882 Allergy status to sulfonamides status: Secondary | ICD-10-CM | POA: Diagnosis not present

## 2023-10-03 DIAGNOSIS — M329 Systemic lupus erythematosus, unspecified: Secondary | ICD-10-CM | POA: Diagnosis not present

## 2023-10-03 DIAGNOSIS — I10 Essential (primary) hypertension: Secondary | ICD-10-CM | POA: Diagnosis not present

## 2023-10-03 DIAGNOSIS — G9341 Metabolic encephalopathy: Secondary | ICD-10-CM | POA: Diagnosis not present

## 2023-10-03 DIAGNOSIS — R079 Chest pain, unspecified: Secondary | ICD-10-CM | POA: Diagnosis not present

## 2023-10-03 DIAGNOSIS — Z792 Long term (current) use of antibiotics: Secondary | ICD-10-CM | POA: Diagnosis not present

## 2023-10-03 DIAGNOSIS — R7989 Other specified abnormal findings of blood chemistry: Secondary | ICD-10-CM | POA: Diagnosis not present

## 2023-10-04 ENCOUNTER — Other Ambulatory Visit: Payer: Self-pay | Admitting: Cardiology

## 2023-10-04 DIAGNOSIS — E46 Unspecified protein-calorie malnutrition: Secondary | ICD-10-CM | POA: Diagnosis not present

## 2023-10-04 DIAGNOSIS — N3000 Acute cystitis without hematuria: Secondary | ICD-10-CM | POA: Diagnosis not present

## 2023-10-04 DIAGNOSIS — R7989 Other specified abnormal findings of blood chemistry: Secondary | ICD-10-CM | POA: Diagnosis not present

## 2023-10-04 DIAGNOSIS — Z880 Allergy status to penicillin: Secondary | ICD-10-CM | POA: Diagnosis not present

## 2023-10-04 DIAGNOSIS — Z881 Allergy status to other antibiotic agents status: Secondary | ICD-10-CM | POA: Diagnosis not present

## 2023-10-04 DIAGNOSIS — S0990XA Unspecified injury of head, initial encounter: Secondary | ICD-10-CM | POA: Diagnosis not present

## 2023-10-04 DIAGNOSIS — R296 Repeated falls: Secondary | ICD-10-CM | POA: Diagnosis not present

## 2023-10-04 DIAGNOSIS — J984 Other disorders of lung: Secondary | ICD-10-CM | POA: Diagnosis not present

## 2023-10-04 DIAGNOSIS — Z792 Long term (current) use of antibiotics: Secondary | ICD-10-CM | POA: Diagnosis not present

## 2023-10-04 DIAGNOSIS — Z79899 Other long term (current) drug therapy: Secondary | ICD-10-CM | POA: Diagnosis not present

## 2023-10-04 DIAGNOSIS — R2689 Other abnormalities of gait and mobility: Secondary | ICD-10-CM | POA: Diagnosis not present

## 2023-10-04 DIAGNOSIS — I10 Essential (primary) hypertension: Secondary | ICD-10-CM | POA: Diagnosis not present

## 2023-10-04 DIAGNOSIS — Z882 Allergy status to sulfonamides status: Secondary | ICD-10-CM | POA: Diagnosis not present

## 2023-10-04 DIAGNOSIS — I959 Hypotension, unspecified: Secondary | ICD-10-CM | POA: Diagnosis not present

## 2023-10-04 DIAGNOSIS — Z791 Long term (current) use of non-steroidal anti-inflammatories (NSAID): Secondary | ICD-10-CM | POA: Diagnosis not present

## 2023-10-04 DIAGNOSIS — G309 Alzheimer's disease, unspecified: Secondary | ICD-10-CM | POA: Diagnosis not present

## 2023-10-04 DIAGNOSIS — R9431 Abnormal electrocardiogram [ECG] [EKG]: Secondary | ICD-10-CM | POA: Diagnosis not present

## 2023-10-04 DIAGNOSIS — I447 Left bundle-branch block, unspecified: Secondary | ICD-10-CM | POA: Diagnosis not present

## 2023-10-04 DIAGNOSIS — G9341 Metabolic encephalopathy: Secondary | ICD-10-CM | POA: Diagnosis not present

## 2023-10-04 DIAGNOSIS — I6200 Nontraumatic subdural hemorrhage, unspecified: Secondary | ICD-10-CM | POA: Diagnosis not present

## 2023-10-04 DIAGNOSIS — R5381 Other malaise: Secondary | ICD-10-CM | POA: Diagnosis not present

## 2023-10-04 DIAGNOSIS — M329 Systemic lupus erythematosus, unspecified: Secondary | ICD-10-CM | POA: Diagnosis not present

## 2023-10-04 DIAGNOSIS — R079 Chest pain, unspecified: Secondary | ICD-10-CM | POA: Diagnosis not present

## 2023-10-04 DIAGNOSIS — I351 Nonrheumatic aortic (valve) insufficiency: Secondary | ICD-10-CM | POA: Diagnosis not present

## 2023-10-05 DIAGNOSIS — R7989 Other specified abnormal findings of blood chemistry: Secondary | ICD-10-CM | POA: Diagnosis not present

## 2023-10-05 DIAGNOSIS — J984 Other disorders of lung: Secondary | ICD-10-CM | POA: Diagnosis not present

## 2023-10-05 DIAGNOSIS — I959 Hypotension, unspecified: Secondary | ICD-10-CM | POA: Diagnosis not present

## 2023-10-05 DIAGNOSIS — Z79899 Other long term (current) drug therapy: Secondary | ICD-10-CM | POA: Diagnosis not present

## 2023-10-05 DIAGNOSIS — Z881 Allergy status to other antibiotic agents status: Secondary | ICD-10-CM | POA: Diagnosis not present

## 2023-10-05 DIAGNOSIS — Z880 Allergy status to penicillin: Secondary | ICD-10-CM | POA: Diagnosis not present

## 2023-10-05 DIAGNOSIS — R079 Chest pain, unspecified: Secondary | ICD-10-CM | POA: Diagnosis not present

## 2023-10-05 DIAGNOSIS — R2689 Other abnormalities of gait and mobility: Secondary | ICD-10-CM | POA: Diagnosis not present

## 2023-10-05 DIAGNOSIS — Z791 Long term (current) use of non-steroidal anti-inflammatories (NSAID): Secondary | ICD-10-CM | POA: Diagnosis not present

## 2023-10-05 DIAGNOSIS — R9431 Abnormal electrocardiogram [ECG] [EKG]: Secondary | ICD-10-CM | POA: Diagnosis not present

## 2023-10-05 DIAGNOSIS — R5381 Other malaise: Secondary | ICD-10-CM | POA: Diagnosis not present

## 2023-10-05 DIAGNOSIS — I447 Left bundle-branch block, unspecified: Secondary | ICD-10-CM | POA: Diagnosis not present

## 2023-10-05 DIAGNOSIS — S0990XA Unspecified injury of head, initial encounter: Secondary | ICD-10-CM | POA: Diagnosis not present

## 2023-10-05 DIAGNOSIS — I351 Nonrheumatic aortic (valve) insufficiency: Secondary | ICD-10-CM | POA: Diagnosis not present

## 2023-10-05 DIAGNOSIS — I10 Essential (primary) hypertension: Secondary | ICD-10-CM | POA: Diagnosis not present

## 2023-10-05 DIAGNOSIS — R296 Repeated falls: Secondary | ICD-10-CM | POA: Diagnosis not present

## 2023-10-05 DIAGNOSIS — I6200 Nontraumatic subdural hemorrhage, unspecified: Secondary | ICD-10-CM | POA: Diagnosis not present

## 2023-10-05 DIAGNOSIS — Z882 Allergy status to sulfonamides status: Secondary | ICD-10-CM | POA: Diagnosis not present

## 2023-10-05 DIAGNOSIS — M329 Systemic lupus erythematosus, unspecified: Secondary | ICD-10-CM | POA: Diagnosis not present

## 2023-10-05 DIAGNOSIS — N3000 Acute cystitis without hematuria: Secondary | ICD-10-CM | POA: Diagnosis not present

## 2023-10-05 DIAGNOSIS — E46 Unspecified protein-calorie malnutrition: Secondary | ICD-10-CM | POA: Diagnosis not present

## 2023-10-05 DIAGNOSIS — G309 Alzheimer's disease, unspecified: Secondary | ICD-10-CM | POA: Diagnosis not present

## 2023-10-05 DIAGNOSIS — G9341 Metabolic encephalopathy: Secondary | ICD-10-CM | POA: Diagnosis not present

## 2023-10-06 DIAGNOSIS — R54 Age-related physical debility: Secondary | ICD-10-CM | POA: Diagnosis not present

## 2023-10-06 DIAGNOSIS — R9431 Abnormal electrocardiogram [ECG] [EKG]: Secondary | ICD-10-CM | POA: Diagnosis not present

## 2023-10-06 DIAGNOSIS — Z880 Allergy status to penicillin: Secondary | ICD-10-CM | POA: Diagnosis not present

## 2023-10-06 DIAGNOSIS — R296 Repeated falls: Secondary | ICD-10-CM | POA: Diagnosis not present

## 2023-10-06 DIAGNOSIS — I6200 Nontraumatic subdural hemorrhage, unspecified: Secondary | ICD-10-CM | POA: Diagnosis not present

## 2023-10-06 DIAGNOSIS — I447 Left bundle-branch block, unspecified: Secondary | ICD-10-CM | POA: Diagnosis not present

## 2023-10-06 DIAGNOSIS — R7989 Other specified abnormal findings of blood chemistry: Secondary | ICD-10-CM | POA: Diagnosis not present

## 2023-10-06 DIAGNOSIS — Z792 Long term (current) use of antibiotics: Secondary | ICD-10-CM | POA: Diagnosis not present

## 2023-10-06 DIAGNOSIS — R2689 Other abnormalities of gait and mobility: Secondary | ICD-10-CM | POA: Diagnosis not present

## 2023-10-06 DIAGNOSIS — I959 Hypotension, unspecified: Secondary | ICD-10-CM | POA: Diagnosis not present

## 2023-10-06 DIAGNOSIS — Z882 Allergy status to sulfonamides status: Secondary | ICD-10-CM | POA: Diagnosis not present

## 2023-10-06 DIAGNOSIS — I351 Nonrheumatic aortic (valve) insufficiency: Secondary | ICD-10-CM | POA: Diagnosis not present

## 2023-10-06 DIAGNOSIS — I10 Essential (primary) hypertension: Secondary | ICD-10-CM | POA: Diagnosis not present

## 2023-10-06 DIAGNOSIS — M329 Systemic lupus erythematosus, unspecified: Secondary | ICD-10-CM | POA: Diagnosis not present

## 2023-10-06 DIAGNOSIS — G9341 Metabolic encephalopathy: Secondary | ICD-10-CM | POA: Diagnosis not present

## 2023-10-06 DIAGNOSIS — E46 Unspecified protein-calorie malnutrition: Secondary | ICD-10-CM | POA: Diagnosis not present

## 2023-10-06 DIAGNOSIS — Z881 Allergy status to other antibiotic agents status: Secondary | ICD-10-CM | POA: Diagnosis not present

## 2023-10-06 DIAGNOSIS — R079 Chest pain, unspecified: Secondary | ICD-10-CM | POA: Diagnosis not present

## 2023-10-06 DIAGNOSIS — J984 Other disorders of lung: Secondary | ICD-10-CM | POA: Diagnosis not present

## 2023-10-06 DIAGNOSIS — Z791 Long term (current) use of non-steroidal anti-inflammatories (NSAID): Secondary | ICD-10-CM | POA: Diagnosis not present

## 2023-10-06 DIAGNOSIS — S0990XA Unspecified injury of head, initial encounter: Secondary | ICD-10-CM | POA: Diagnosis not present

## 2023-10-06 DIAGNOSIS — Z79899 Other long term (current) drug therapy: Secondary | ICD-10-CM | POA: Diagnosis not present

## 2023-10-06 DIAGNOSIS — N3000 Acute cystitis without hematuria: Secondary | ICD-10-CM | POA: Diagnosis not present

## 2023-10-06 DIAGNOSIS — G309 Alzheimer's disease, unspecified: Secondary | ICD-10-CM | POA: Diagnosis not present

## 2023-10-07 DIAGNOSIS — I447 Left bundle-branch block, unspecified: Secondary | ICD-10-CM | POA: Diagnosis not present

## 2023-10-07 DIAGNOSIS — Z881 Allergy status to other antibiotic agents status: Secondary | ICD-10-CM | POA: Diagnosis not present

## 2023-10-07 DIAGNOSIS — R079 Chest pain, unspecified: Secondary | ICD-10-CM | POA: Diagnosis not present

## 2023-10-07 DIAGNOSIS — J984 Other disorders of lung: Secondary | ICD-10-CM | POA: Diagnosis not present

## 2023-10-07 DIAGNOSIS — G309 Alzheimer's disease, unspecified: Secondary | ICD-10-CM | POA: Diagnosis not present

## 2023-10-07 DIAGNOSIS — I351 Nonrheumatic aortic (valve) insufficiency: Secondary | ICD-10-CM | POA: Diagnosis not present

## 2023-10-07 DIAGNOSIS — G9341 Metabolic encephalopathy: Secondary | ICD-10-CM | POA: Diagnosis not present

## 2023-10-07 DIAGNOSIS — N3000 Acute cystitis without hematuria: Secondary | ICD-10-CM | POA: Diagnosis not present

## 2023-10-07 DIAGNOSIS — R5381 Other malaise: Secondary | ICD-10-CM | POA: Diagnosis not present

## 2023-10-07 DIAGNOSIS — Z882 Allergy status to sulfonamides status: Secondary | ICD-10-CM | POA: Diagnosis not present

## 2023-10-07 DIAGNOSIS — M329 Systemic lupus erythematosus, unspecified: Secondary | ICD-10-CM | POA: Diagnosis not present

## 2023-10-07 DIAGNOSIS — R296 Repeated falls: Secondary | ICD-10-CM | POA: Diagnosis not present

## 2023-10-07 DIAGNOSIS — S0990XA Unspecified injury of head, initial encounter: Secondary | ICD-10-CM | POA: Diagnosis not present

## 2023-10-07 DIAGNOSIS — Z79899 Other long term (current) drug therapy: Secondary | ICD-10-CM | POA: Diagnosis not present

## 2023-10-07 DIAGNOSIS — R9431 Abnormal electrocardiogram [ECG] [EKG]: Secondary | ICD-10-CM | POA: Diagnosis not present

## 2023-10-07 DIAGNOSIS — I10 Essential (primary) hypertension: Secondary | ICD-10-CM | POA: Diagnosis not present

## 2023-10-07 DIAGNOSIS — Z880 Allergy status to penicillin: Secondary | ICD-10-CM | POA: Diagnosis not present

## 2023-10-07 DIAGNOSIS — I6200 Nontraumatic subdural hemorrhage, unspecified: Secondary | ICD-10-CM | POA: Diagnosis not present

## 2023-10-07 DIAGNOSIS — I959 Hypotension, unspecified: Secondary | ICD-10-CM | POA: Diagnosis not present

## 2023-10-07 DIAGNOSIS — R7989 Other specified abnormal findings of blood chemistry: Secondary | ICD-10-CM | POA: Diagnosis not present

## 2023-10-07 DIAGNOSIS — E46 Unspecified protein-calorie malnutrition: Secondary | ICD-10-CM | POA: Diagnosis not present

## 2023-10-07 DIAGNOSIS — Z791 Long term (current) use of non-steroidal anti-inflammatories (NSAID): Secondary | ICD-10-CM | POA: Diagnosis not present

## 2023-10-07 DIAGNOSIS — R2689 Other abnormalities of gait and mobility: Secondary | ICD-10-CM | POA: Diagnosis not present

## 2023-10-08 DIAGNOSIS — R5381 Other malaise: Secondary | ICD-10-CM | POA: Diagnosis not present

## 2023-10-08 DIAGNOSIS — Z882 Allergy status to sulfonamides status: Secondary | ICD-10-CM | POA: Diagnosis not present

## 2023-10-08 DIAGNOSIS — Z791 Long term (current) use of non-steroidal anti-inflammatories (NSAID): Secondary | ICD-10-CM | POA: Diagnosis not present

## 2023-10-08 DIAGNOSIS — I6200 Nontraumatic subdural hemorrhage, unspecified: Secondary | ICD-10-CM | POA: Diagnosis not present

## 2023-10-08 DIAGNOSIS — Z79899 Other long term (current) drug therapy: Secondary | ICD-10-CM | POA: Diagnosis not present

## 2023-10-08 DIAGNOSIS — M329 Systemic lupus erythematosus, unspecified: Secondary | ICD-10-CM | POA: Diagnosis not present

## 2023-10-08 DIAGNOSIS — R7989 Other specified abnormal findings of blood chemistry: Secondary | ICD-10-CM | POA: Diagnosis not present

## 2023-10-08 DIAGNOSIS — R9431 Abnormal electrocardiogram [ECG] [EKG]: Secondary | ICD-10-CM | POA: Diagnosis not present

## 2023-10-08 DIAGNOSIS — I959 Hypotension, unspecified: Secondary | ICD-10-CM | POA: Diagnosis not present

## 2023-10-08 DIAGNOSIS — R079 Chest pain, unspecified: Secondary | ICD-10-CM | POA: Diagnosis not present

## 2023-10-08 DIAGNOSIS — J984 Other disorders of lung: Secondary | ICD-10-CM | POA: Diagnosis not present

## 2023-10-08 DIAGNOSIS — G309 Alzheimer's disease, unspecified: Secondary | ICD-10-CM | POA: Diagnosis not present

## 2023-10-08 DIAGNOSIS — E46 Unspecified protein-calorie malnutrition: Secondary | ICD-10-CM | POA: Diagnosis not present

## 2023-10-08 DIAGNOSIS — I447 Left bundle-branch block, unspecified: Secondary | ICD-10-CM | POA: Diagnosis not present

## 2023-10-08 DIAGNOSIS — I351 Nonrheumatic aortic (valve) insufficiency: Secondary | ICD-10-CM | POA: Diagnosis not present

## 2023-10-08 DIAGNOSIS — N3000 Acute cystitis without hematuria: Secondary | ICD-10-CM | POA: Diagnosis not present

## 2023-10-08 DIAGNOSIS — R2689 Other abnormalities of gait and mobility: Secondary | ICD-10-CM | POA: Diagnosis not present

## 2023-10-08 DIAGNOSIS — Z881 Allergy status to other antibiotic agents status: Secondary | ICD-10-CM | POA: Diagnosis not present

## 2023-10-08 DIAGNOSIS — Z792 Long term (current) use of antibiotics: Secondary | ICD-10-CM | POA: Diagnosis not present

## 2023-10-08 DIAGNOSIS — Z880 Allergy status to penicillin: Secondary | ICD-10-CM | POA: Diagnosis not present

## 2023-10-08 DIAGNOSIS — S0990XA Unspecified injury of head, initial encounter: Secondary | ICD-10-CM | POA: Diagnosis not present

## 2023-10-08 DIAGNOSIS — I10 Essential (primary) hypertension: Secondary | ICD-10-CM | POA: Diagnosis not present

## 2023-10-08 DIAGNOSIS — G9341 Metabolic encephalopathy: Secondary | ICD-10-CM | POA: Diagnosis not present

## 2023-10-08 DIAGNOSIS — R296 Repeated falls: Secondary | ICD-10-CM | POA: Diagnosis not present

## 2023-10-09 DIAGNOSIS — E46 Unspecified protein-calorie malnutrition: Secondary | ICD-10-CM | POA: Diagnosis not present

## 2023-10-09 DIAGNOSIS — I447 Left bundle-branch block, unspecified: Secondary | ICD-10-CM | POA: Diagnosis not present

## 2023-10-09 DIAGNOSIS — G9340 Encephalopathy, unspecified: Secondary | ICD-10-CM | POA: Diagnosis not present

## 2023-10-09 DIAGNOSIS — Z79899 Other long term (current) drug therapy: Secondary | ICD-10-CM | POA: Diagnosis not present

## 2023-10-09 DIAGNOSIS — R9431 Abnormal electrocardiogram [ECG] [EKG]: Secondary | ICD-10-CM | POA: Diagnosis not present

## 2023-10-09 DIAGNOSIS — I351 Nonrheumatic aortic (valve) insufficiency: Secondary | ICD-10-CM | POA: Diagnosis not present

## 2023-10-09 DIAGNOSIS — Z792 Long term (current) use of antibiotics: Secondary | ICD-10-CM | POA: Diagnosis not present

## 2023-10-09 DIAGNOSIS — R7989 Other specified abnormal findings of blood chemistry: Secondary | ICD-10-CM | POA: Diagnosis not present

## 2023-10-09 DIAGNOSIS — R2689 Other abnormalities of gait and mobility: Secondary | ICD-10-CM | POA: Diagnosis not present

## 2023-10-09 DIAGNOSIS — N3000 Acute cystitis without hematuria: Secondary | ICD-10-CM | POA: Diagnosis not present

## 2023-10-09 DIAGNOSIS — R079 Chest pain, unspecified: Secondary | ICD-10-CM | POA: Diagnosis not present

## 2023-10-09 DIAGNOSIS — I959 Hypotension, unspecified: Secondary | ICD-10-CM | POA: Diagnosis not present

## 2023-10-09 DIAGNOSIS — Z882 Allergy status to sulfonamides status: Secondary | ICD-10-CM | POA: Diagnosis not present

## 2023-10-09 DIAGNOSIS — M329 Systemic lupus erythematosus, unspecified: Secondary | ICD-10-CM | POA: Diagnosis not present

## 2023-10-09 DIAGNOSIS — I10 Essential (primary) hypertension: Secondary | ICD-10-CM | POA: Diagnosis not present

## 2023-10-09 DIAGNOSIS — S0990XA Unspecified injury of head, initial encounter: Secondary | ICD-10-CM | POA: Diagnosis not present

## 2023-10-09 DIAGNOSIS — R296 Repeated falls: Secondary | ICD-10-CM | POA: Diagnosis not present

## 2023-10-09 DIAGNOSIS — Z880 Allergy status to penicillin: Secondary | ICD-10-CM | POA: Diagnosis not present

## 2023-10-09 DIAGNOSIS — J984 Other disorders of lung: Secondary | ICD-10-CM | POA: Diagnosis not present

## 2023-10-09 DIAGNOSIS — G309 Alzheimer's disease, unspecified: Secondary | ICD-10-CM | POA: Diagnosis not present

## 2023-10-09 DIAGNOSIS — Z791 Long term (current) use of non-steroidal anti-inflammatories (NSAID): Secondary | ICD-10-CM | POA: Diagnosis not present

## 2023-10-09 DIAGNOSIS — Z881 Allergy status to other antibiotic agents status: Secondary | ICD-10-CM | POA: Diagnosis not present

## 2023-10-09 DIAGNOSIS — G9341 Metabolic encephalopathy: Secondary | ICD-10-CM | POA: Diagnosis not present

## 2023-10-09 DIAGNOSIS — I6200 Nontraumatic subdural hemorrhage, unspecified: Secondary | ICD-10-CM | POA: Diagnosis not present

## 2023-10-10 DIAGNOSIS — R9431 Abnormal electrocardiogram [ECG] [EKG]: Secondary | ICD-10-CM | POA: Diagnosis not present

## 2023-10-10 DIAGNOSIS — I447 Left bundle-branch block, unspecified: Secondary | ICD-10-CM | POA: Diagnosis not present

## 2023-10-10 DIAGNOSIS — E46 Unspecified protein-calorie malnutrition: Secondary | ICD-10-CM | POA: Diagnosis not present

## 2023-10-10 DIAGNOSIS — I351 Nonrheumatic aortic (valve) insufficiency: Secondary | ICD-10-CM | POA: Diagnosis not present

## 2023-10-10 DIAGNOSIS — I6200 Nontraumatic subdural hemorrhage, unspecified: Secondary | ICD-10-CM | POA: Diagnosis not present

## 2023-10-10 DIAGNOSIS — Z880 Allergy status to penicillin: Secondary | ICD-10-CM | POA: Diagnosis not present

## 2023-10-10 DIAGNOSIS — I959 Hypotension, unspecified: Secondary | ICD-10-CM | POA: Diagnosis not present

## 2023-10-10 DIAGNOSIS — J984 Other disorders of lung: Secondary | ICD-10-CM | POA: Diagnosis not present

## 2023-10-10 DIAGNOSIS — G309 Alzheimer's disease, unspecified: Secondary | ICD-10-CM | POA: Diagnosis not present

## 2023-10-10 DIAGNOSIS — Z79899 Other long term (current) drug therapy: Secondary | ICD-10-CM | POA: Diagnosis not present

## 2023-10-10 DIAGNOSIS — S0990XA Unspecified injury of head, initial encounter: Secondary | ICD-10-CM | POA: Diagnosis not present

## 2023-10-10 DIAGNOSIS — G9341 Metabolic encephalopathy: Secondary | ICD-10-CM | POA: Diagnosis not present

## 2023-10-10 DIAGNOSIS — Z791 Long term (current) use of non-steroidal anti-inflammatories (NSAID): Secondary | ICD-10-CM | POA: Diagnosis not present

## 2023-10-10 DIAGNOSIS — R7989 Other specified abnormal findings of blood chemistry: Secondary | ICD-10-CM | POA: Diagnosis not present

## 2023-10-10 DIAGNOSIS — M329 Systemic lupus erythematosus, unspecified: Secondary | ICD-10-CM | POA: Diagnosis not present

## 2023-10-10 DIAGNOSIS — R296 Repeated falls: Secondary | ICD-10-CM | POA: Diagnosis not present

## 2023-10-10 DIAGNOSIS — N3000 Acute cystitis without hematuria: Secondary | ICD-10-CM | POA: Diagnosis not present

## 2023-10-10 DIAGNOSIS — Z882 Allergy status to sulfonamides status: Secondary | ICD-10-CM | POA: Diagnosis not present

## 2023-10-10 DIAGNOSIS — Z881 Allergy status to other antibiotic agents status: Secondary | ICD-10-CM | POA: Diagnosis not present

## 2023-10-10 DIAGNOSIS — R2689 Other abnormalities of gait and mobility: Secondary | ICD-10-CM | POA: Diagnosis not present

## 2023-10-10 DIAGNOSIS — R079 Chest pain, unspecified: Secondary | ICD-10-CM | POA: Diagnosis not present

## 2023-10-10 DIAGNOSIS — I10 Essential (primary) hypertension: Secondary | ICD-10-CM | POA: Diagnosis not present

## 2023-10-11 DIAGNOSIS — I351 Nonrheumatic aortic (valve) insufficiency: Secondary | ICD-10-CM | POA: Diagnosis not present

## 2023-10-11 DIAGNOSIS — E46 Unspecified protein-calorie malnutrition: Secondary | ICD-10-CM | POA: Diagnosis not present

## 2023-10-11 DIAGNOSIS — Z791 Long term (current) use of non-steroidal anti-inflammatories (NSAID): Secondary | ICD-10-CM | POA: Diagnosis not present

## 2023-10-11 DIAGNOSIS — S0990XA Unspecified injury of head, initial encounter: Secondary | ICD-10-CM | POA: Diagnosis not present

## 2023-10-11 DIAGNOSIS — M25521 Pain in right elbow: Secondary | ICD-10-CM | POA: Diagnosis not present

## 2023-10-11 DIAGNOSIS — Z79899 Other long term (current) drug therapy: Secondary | ICD-10-CM | POA: Diagnosis not present

## 2023-10-11 DIAGNOSIS — M1711 Unilateral primary osteoarthritis, right knee: Secondary | ICD-10-CM | POA: Diagnosis not present

## 2023-10-11 DIAGNOSIS — G309 Alzheimer's disease, unspecified: Secondary | ICD-10-CM | POA: Diagnosis not present

## 2023-10-11 DIAGNOSIS — R7989 Other specified abnormal findings of blood chemistry: Secondary | ICD-10-CM | POA: Diagnosis not present

## 2023-10-11 DIAGNOSIS — I959 Hypotension, unspecified: Secondary | ICD-10-CM | POA: Diagnosis not present

## 2023-10-11 DIAGNOSIS — R296 Repeated falls: Secondary | ICD-10-CM | POA: Diagnosis not present

## 2023-10-11 DIAGNOSIS — Z880 Allergy status to penicillin: Secondary | ICD-10-CM | POA: Diagnosis not present

## 2023-10-11 DIAGNOSIS — I6200 Nontraumatic subdural hemorrhage, unspecified: Secondary | ICD-10-CM | POA: Diagnosis not present

## 2023-10-11 DIAGNOSIS — Z882 Allergy status to sulfonamides status: Secondary | ICD-10-CM | POA: Diagnosis not present

## 2023-10-11 DIAGNOSIS — M329 Systemic lupus erythematosus, unspecified: Secondary | ICD-10-CM | POA: Diagnosis not present

## 2023-10-11 DIAGNOSIS — R079 Chest pain, unspecified: Secondary | ICD-10-CM | POA: Diagnosis not present

## 2023-10-11 DIAGNOSIS — I447 Left bundle-branch block, unspecified: Secondary | ICD-10-CM | POA: Diagnosis not present

## 2023-10-11 DIAGNOSIS — M25561 Pain in right knee: Secondary | ICD-10-CM | POA: Diagnosis not present

## 2023-10-11 DIAGNOSIS — G9341 Metabolic encephalopathy: Secondary | ICD-10-CM | POA: Diagnosis not present

## 2023-10-11 DIAGNOSIS — I10 Essential (primary) hypertension: Secondary | ICD-10-CM | POA: Diagnosis not present

## 2023-10-11 DIAGNOSIS — R9431 Abnormal electrocardiogram [ECG] [EKG]: Secondary | ICD-10-CM | POA: Diagnosis not present

## 2023-10-11 DIAGNOSIS — N3000 Acute cystitis without hematuria: Secondary | ICD-10-CM | POA: Diagnosis not present

## 2023-10-11 DIAGNOSIS — J984 Other disorders of lung: Secondary | ICD-10-CM | POA: Diagnosis not present

## 2023-10-11 DIAGNOSIS — R2689 Other abnormalities of gait and mobility: Secondary | ICD-10-CM | POA: Diagnosis not present

## 2023-10-11 DIAGNOSIS — Z881 Allergy status to other antibiotic agents status: Secondary | ICD-10-CM | POA: Diagnosis not present

## 2023-10-11 DIAGNOSIS — Z792 Long term (current) use of antibiotics: Secondary | ICD-10-CM | POA: Diagnosis not present

## 2023-10-12 DIAGNOSIS — M329 Systemic lupus erythematosus, unspecified: Secondary | ICD-10-CM | POA: Diagnosis not present

## 2023-10-12 DIAGNOSIS — I447 Left bundle-branch block, unspecified: Secondary | ICD-10-CM | POA: Diagnosis not present

## 2023-10-12 DIAGNOSIS — I6200 Nontraumatic subdural hemorrhage, unspecified: Secondary | ICD-10-CM | POA: Diagnosis not present

## 2023-10-12 DIAGNOSIS — S0990XA Unspecified injury of head, initial encounter: Secondary | ICD-10-CM | POA: Diagnosis not present

## 2023-10-12 DIAGNOSIS — Z881 Allergy status to other antibiotic agents status: Secondary | ICD-10-CM | POA: Diagnosis not present

## 2023-10-12 DIAGNOSIS — I959 Hypotension, unspecified: Secondary | ICD-10-CM | POA: Diagnosis not present

## 2023-10-12 DIAGNOSIS — Z882 Allergy status to sulfonamides status: Secondary | ICD-10-CM | POA: Diagnosis not present

## 2023-10-12 DIAGNOSIS — R296 Repeated falls: Secondary | ICD-10-CM | POA: Diagnosis not present

## 2023-10-12 DIAGNOSIS — I351 Nonrheumatic aortic (valve) insufficiency: Secondary | ICD-10-CM | POA: Diagnosis not present

## 2023-10-12 DIAGNOSIS — Z791 Long term (current) use of non-steroidal anti-inflammatories (NSAID): Secondary | ICD-10-CM | POA: Diagnosis not present

## 2023-10-12 DIAGNOSIS — N3000 Acute cystitis without hematuria: Secondary | ICD-10-CM | POA: Diagnosis not present

## 2023-10-12 DIAGNOSIS — R079 Chest pain, unspecified: Secondary | ICD-10-CM | POA: Diagnosis not present

## 2023-10-12 DIAGNOSIS — R7989 Other specified abnormal findings of blood chemistry: Secondary | ICD-10-CM | POA: Diagnosis not present

## 2023-10-12 DIAGNOSIS — Z79899 Other long term (current) drug therapy: Secondary | ICD-10-CM | POA: Diagnosis not present

## 2023-10-12 DIAGNOSIS — Z880 Allergy status to penicillin: Secondary | ICD-10-CM | POA: Diagnosis not present

## 2023-10-12 DIAGNOSIS — R9431 Abnormal electrocardiogram [ECG] [EKG]: Secondary | ICD-10-CM | POA: Diagnosis not present

## 2023-10-12 DIAGNOSIS — G9341 Metabolic encephalopathy: Secondary | ICD-10-CM | POA: Diagnosis not present

## 2023-10-12 DIAGNOSIS — J984 Other disorders of lung: Secondary | ICD-10-CM | POA: Diagnosis not present

## 2023-10-12 DIAGNOSIS — G309 Alzheimer's disease, unspecified: Secondary | ICD-10-CM | POA: Diagnosis not present

## 2023-10-12 DIAGNOSIS — R2689 Other abnormalities of gait and mobility: Secondary | ICD-10-CM | POA: Diagnosis not present

## 2023-10-12 DIAGNOSIS — E46 Unspecified protein-calorie malnutrition: Secondary | ICD-10-CM | POA: Diagnosis not present

## 2023-10-12 DIAGNOSIS — I10 Essential (primary) hypertension: Secondary | ICD-10-CM | POA: Diagnosis not present

## 2023-10-12 DIAGNOSIS — R262 Difficulty in walking, not elsewhere classified: Secondary | ICD-10-CM | POA: Diagnosis not present

## 2023-10-13 DIAGNOSIS — M329 Systemic lupus erythematosus, unspecified: Secondary | ICD-10-CM | POA: Diagnosis not present

## 2023-10-13 DIAGNOSIS — S0990XA Unspecified injury of head, initial encounter: Secondary | ICD-10-CM | POA: Diagnosis not present

## 2023-10-13 DIAGNOSIS — G309 Alzheimer's disease, unspecified: Secondary | ICD-10-CM | POA: Diagnosis not present

## 2023-10-13 DIAGNOSIS — I6200 Nontraumatic subdural hemorrhage, unspecified: Secondary | ICD-10-CM | POA: Diagnosis not present

## 2023-10-13 DIAGNOSIS — R404 Transient alteration of awareness: Secondary | ICD-10-CM | POA: Diagnosis not present

## 2023-10-13 DIAGNOSIS — Z8673 Personal history of transient ischemic attack (TIA), and cerebral infarction without residual deficits: Secondary | ICD-10-CM | POA: Diagnosis not present

## 2023-10-13 DIAGNOSIS — R9431 Abnormal electrocardiogram [ECG] [EKG]: Secondary | ICD-10-CM | POA: Diagnosis not present

## 2023-10-13 DIAGNOSIS — R278 Other lack of coordination: Secondary | ICD-10-CM | POA: Diagnosis not present

## 2023-10-13 DIAGNOSIS — R7989 Other specified abnormal findings of blood chemistry: Secondary | ICD-10-CM | POA: Diagnosis not present

## 2023-10-13 DIAGNOSIS — R112 Nausea with vomiting, unspecified: Secondary | ICD-10-CM | POA: Diagnosis not present

## 2023-10-13 DIAGNOSIS — R5381 Other malaise: Secondary | ICD-10-CM | POA: Diagnosis not present

## 2023-10-13 DIAGNOSIS — R079 Chest pain, unspecified: Secondary | ICD-10-CM | POA: Diagnosis not present

## 2023-10-13 DIAGNOSIS — I10 Essential (primary) hypertension: Secondary | ICD-10-CM | POA: Diagnosis not present

## 2023-10-13 DIAGNOSIS — M158 Other polyosteoarthritis: Secondary | ICD-10-CM | POA: Diagnosis not present

## 2023-10-13 DIAGNOSIS — R6889 Other general symptoms and signs: Secondary | ICD-10-CM | POA: Diagnosis not present

## 2023-10-13 DIAGNOSIS — E46 Unspecified protein-calorie malnutrition: Secondary | ICD-10-CM | POA: Diagnosis not present

## 2023-10-13 DIAGNOSIS — E0789 Other specified disorders of thyroid: Secondary | ICD-10-CM | POA: Diagnosis not present

## 2023-10-13 DIAGNOSIS — R1310 Dysphagia, unspecified: Secondary | ICD-10-CM | POA: Diagnosis not present

## 2023-10-13 DIAGNOSIS — R296 Repeated falls: Secondary | ICD-10-CM | POA: Diagnosis not present

## 2023-10-13 DIAGNOSIS — Z79899 Other long term (current) drug therapy: Secondary | ICD-10-CM | POA: Diagnosis not present

## 2023-10-13 DIAGNOSIS — Z743 Need for continuous supervision: Secondary | ICD-10-CM | POA: Diagnosis not present

## 2023-10-13 DIAGNOSIS — M6281 Muscle weakness (generalized): Secondary | ICD-10-CM | POA: Diagnosis not present

## 2023-10-13 DIAGNOSIS — I351 Nonrheumatic aortic (valve) insufficiency: Secondary | ICD-10-CM | POA: Diagnosis not present

## 2023-10-13 DIAGNOSIS — N3000 Acute cystitis without hematuria: Secondary | ICD-10-CM | POA: Diagnosis not present

## 2023-10-13 DIAGNOSIS — J984 Other disorders of lung: Secondary | ICD-10-CM | POA: Diagnosis not present

## 2023-10-13 DIAGNOSIS — G9341 Metabolic encephalopathy: Secondary | ICD-10-CM | POA: Diagnosis not present

## 2023-10-13 DIAGNOSIS — R2689 Other abnormalities of gait and mobility: Secondary | ICD-10-CM | POA: Diagnosis not present

## 2023-10-13 DIAGNOSIS — I69891 Dysphagia following other cerebrovascular disease: Secondary | ICD-10-CM | POA: Diagnosis not present

## 2023-10-13 DIAGNOSIS — Z882 Allergy status to sulfonamides status: Secondary | ICD-10-CM | POA: Diagnosis not present

## 2023-10-13 DIAGNOSIS — Z791 Long term (current) use of non-steroidal anti-inflammatories (NSAID): Secondary | ICD-10-CM | POA: Diagnosis not present

## 2023-10-13 DIAGNOSIS — Z792 Long term (current) use of antibiotics: Secondary | ICD-10-CM | POA: Diagnosis not present

## 2023-10-13 DIAGNOSIS — Z880 Allergy status to penicillin: Secondary | ICD-10-CM | POA: Diagnosis not present

## 2023-10-13 DIAGNOSIS — I959 Hypotension, unspecified: Secondary | ICD-10-CM | POA: Diagnosis not present

## 2023-10-13 DIAGNOSIS — S065XAA Traumatic subdural hemorrhage with loss of consciousness status unknown, initial encounter: Secondary | ICD-10-CM | POA: Diagnosis not present

## 2023-10-13 DIAGNOSIS — I447 Left bundle-branch block, unspecified: Secondary | ICD-10-CM | POA: Diagnosis not present

## 2023-10-13 DIAGNOSIS — Z881 Allergy status to other antibiotic agents status: Secondary | ICD-10-CM | POA: Diagnosis not present

## 2023-10-13 DIAGNOSIS — E210 Primary hyperparathyroidism: Secondary | ICD-10-CM | POA: Diagnosis not present

## 2023-10-16 DIAGNOSIS — S065XAA Traumatic subdural hemorrhage with loss of consciousness status unknown, initial encounter: Secondary | ICD-10-CM | POA: Diagnosis not present

## 2023-10-21 DIAGNOSIS — G9341 Metabolic encephalopathy: Secondary | ICD-10-CM | POA: Diagnosis not present

## 2023-10-21 DIAGNOSIS — G309 Alzheimer's disease, unspecified: Secondary | ICD-10-CM | POA: Diagnosis not present

## 2023-10-22 DIAGNOSIS — R112 Nausea with vomiting, unspecified: Secondary | ICD-10-CM | POA: Diagnosis not present

## 2023-10-23 DIAGNOSIS — G9341 Metabolic encephalopathy: Secondary | ICD-10-CM | POA: Diagnosis not present

## 2023-10-23 DIAGNOSIS — G9001 Carotid sinus syncope: Secondary | ICD-10-CM | POA: Diagnosis not present

## 2023-10-23 DIAGNOSIS — G309 Alzheimer's disease, unspecified: Secondary | ICD-10-CM | POA: Diagnosis not present

## 2023-10-26 DIAGNOSIS — R2689 Other abnormalities of gait and mobility: Secondary | ICD-10-CM | POA: Diagnosis not present

## 2023-10-26 DIAGNOSIS — M6281 Muscle weakness (generalized): Secondary | ICD-10-CM | POA: Diagnosis not present

## 2023-10-26 DIAGNOSIS — I69891 Dysphagia following other cerebrovascular disease: Secondary | ICD-10-CM | POA: Diagnosis not present

## 2023-10-26 DIAGNOSIS — R278 Other lack of coordination: Secondary | ICD-10-CM | POA: Diagnosis not present

## 2023-10-26 DIAGNOSIS — G9341 Metabolic encephalopathy: Secondary | ICD-10-CM | POA: Diagnosis not present

## 2023-10-26 DIAGNOSIS — G309 Alzheimer's disease, unspecified: Secondary | ICD-10-CM | POA: Diagnosis not present

## 2023-10-29 DIAGNOSIS — G309 Alzheimer's disease, unspecified: Secondary | ICD-10-CM | POA: Diagnosis not present

## 2023-11-02 DIAGNOSIS — G9341 Metabolic encephalopathy: Secondary | ICD-10-CM | POA: Diagnosis not present

## 2023-11-02 DIAGNOSIS — G309 Alzheimer's disease, unspecified: Secondary | ICD-10-CM | POA: Diagnosis not present

## 2023-11-03 DIAGNOSIS — G9341 Metabolic encephalopathy: Secondary | ICD-10-CM | POA: Diagnosis not present

## 2023-11-03 DIAGNOSIS — G309 Alzheimer's disease, unspecified: Secondary | ICD-10-CM | POA: Diagnosis not present

## 2023-11-05 DIAGNOSIS — G309 Alzheimer's disease, unspecified: Secondary | ICD-10-CM | POA: Diagnosis not present

## 2023-11-07 DIAGNOSIS — G9341 Metabolic encephalopathy: Secondary | ICD-10-CM | POA: Diagnosis not present

## 2023-11-07 DIAGNOSIS — G309 Alzheimer's disease, unspecified: Secondary | ICD-10-CM | POA: Diagnosis not present

## 2023-11-07 DIAGNOSIS — M6281 Muscle weakness (generalized): Secondary | ICD-10-CM | POA: Diagnosis not present

## 2023-11-07 DIAGNOSIS — R2689 Other abnormalities of gait and mobility: Secondary | ICD-10-CM | POA: Diagnosis not present

## 2023-11-07 DIAGNOSIS — R278 Other lack of coordination: Secondary | ICD-10-CM | POA: Diagnosis not present

## 2023-11-07 DIAGNOSIS — I69891 Dysphagia following other cerebrovascular disease: Secondary | ICD-10-CM | POA: Diagnosis not present

## 2023-11-09 DIAGNOSIS — R278 Other lack of coordination: Secondary | ICD-10-CM | POA: Diagnosis not present

## 2023-11-09 DIAGNOSIS — I69891 Dysphagia following other cerebrovascular disease: Secondary | ICD-10-CM | POA: Diagnosis not present

## 2023-11-09 DIAGNOSIS — M6281 Muscle weakness (generalized): Secondary | ICD-10-CM | POA: Diagnosis not present

## 2023-11-09 DIAGNOSIS — R2689 Other abnormalities of gait and mobility: Secondary | ICD-10-CM | POA: Diagnosis not present

## 2023-11-09 DIAGNOSIS — G309 Alzheimer's disease, unspecified: Secondary | ICD-10-CM | POA: Diagnosis not present

## 2023-11-09 DIAGNOSIS — G9341 Metabolic encephalopathy: Secondary | ICD-10-CM | POA: Diagnosis not present

## 2023-11-10 DIAGNOSIS — I69891 Dysphagia following other cerebrovascular disease: Secondary | ICD-10-CM | POA: Diagnosis not present

## 2023-11-10 DIAGNOSIS — R2689 Other abnormalities of gait and mobility: Secondary | ICD-10-CM | POA: Diagnosis not present

## 2023-11-10 DIAGNOSIS — R278 Other lack of coordination: Secondary | ICD-10-CM | POA: Diagnosis not present

## 2023-11-10 DIAGNOSIS — M6281 Muscle weakness (generalized): Secondary | ICD-10-CM | POA: Diagnosis not present

## 2023-11-10 DIAGNOSIS — G9341 Metabolic encephalopathy: Secondary | ICD-10-CM | POA: Diagnosis not present

## 2023-11-10 DIAGNOSIS — G309 Alzheimer's disease, unspecified: Secondary | ICD-10-CM | POA: Diagnosis not present

## 2023-11-11 DIAGNOSIS — I69891 Dysphagia following other cerebrovascular disease: Secondary | ICD-10-CM | POA: Diagnosis not present

## 2023-11-11 DIAGNOSIS — G309 Alzheimer's disease, unspecified: Secondary | ICD-10-CM | POA: Diagnosis not present

## 2023-11-11 DIAGNOSIS — R278 Other lack of coordination: Secondary | ICD-10-CM | POA: Diagnosis not present

## 2023-11-11 DIAGNOSIS — R2689 Other abnormalities of gait and mobility: Secondary | ICD-10-CM | POA: Diagnosis not present

## 2023-11-11 DIAGNOSIS — G9341 Metabolic encephalopathy: Secondary | ICD-10-CM | POA: Diagnosis not present

## 2023-11-11 DIAGNOSIS — M6281 Muscle weakness (generalized): Secondary | ICD-10-CM | POA: Diagnosis not present

## 2023-11-12 DIAGNOSIS — R2689 Other abnormalities of gait and mobility: Secondary | ICD-10-CM | POA: Diagnosis not present

## 2023-11-12 DIAGNOSIS — I69891 Dysphagia following other cerebrovascular disease: Secondary | ICD-10-CM | POA: Diagnosis not present

## 2023-11-12 DIAGNOSIS — G309 Alzheimer's disease, unspecified: Secondary | ICD-10-CM | POA: Diagnosis not present

## 2023-11-12 DIAGNOSIS — M6281 Muscle weakness (generalized): Secondary | ICD-10-CM | POA: Diagnosis not present

## 2023-11-12 DIAGNOSIS — G9341 Metabolic encephalopathy: Secondary | ICD-10-CM | POA: Diagnosis not present

## 2023-11-12 DIAGNOSIS — R278 Other lack of coordination: Secondary | ICD-10-CM | POA: Diagnosis not present

## 2023-11-13 ENCOUNTER — Emergency Department (HOSPITAL_COMMUNITY)
Admission: EM | Admit: 2023-11-13 | Discharge: 2023-11-13 | Disposition: A | Discharge status: Home / Self Care | Attending: Emergency Medicine | Admitting: Emergency Medicine

## 2023-11-13 ENCOUNTER — Emergency Department (HOSPITAL_COMMUNITY)

## 2023-11-13 ENCOUNTER — Other Ambulatory Visit: Payer: Self-pay

## 2023-11-13 ENCOUNTER — Encounter (HOSPITAL_COMMUNITY): Payer: Self-pay | Admitting: Emergency Medicine

## 2023-11-13 DIAGNOSIS — I69891 Dysphagia following other cerebrovascular disease: Secondary | ICD-10-CM | POA: Diagnosis not present

## 2023-11-13 DIAGNOSIS — G9341 Metabolic encephalopathy: Secondary | ICD-10-CM | POA: Diagnosis not present

## 2023-11-13 DIAGNOSIS — F0390 Unspecified dementia without behavioral disturbance: Secondary | ICD-10-CM | POA: Diagnosis not present

## 2023-11-13 DIAGNOSIS — S0990XA Unspecified injury of head, initial encounter: Secondary | ICD-10-CM | POA: Insufficient documentation

## 2023-11-13 DIAGNOSIS — R6889 Other general symptoms and signs: Secondary | ICD-10-CM | POA: Diagnosis not present

## 2023-11-13 DIAGNOSIS — S0003XA Contusion of scalp, initial encounter: Secondary | ICD-10-CM | POA: Diagnosis not present

## 2023-11-13 DIAGNOSIS — W19XXXA Unspecified fall, initial encounter: Secondary | ICD-10-CM

## 2023-11-13 DIAGNOSIS — S161XXA Strain of muscle, fascia and tendon at neck level, initial encounter: Secondary | ICD-10-CM | POA: Insufficient documentation

## 2023-11-13 DIAGNOSIS — W01198A Fall on same level from slipping, tripping and stumbling with subsequent striking against other object, initial encounter: Secondary | ICD-10-CM | POA: Diagnosis not present

## 2023-11-13 DIAGNOSIS — R278 Other lack of coordination: Secondary | ICD-10-CM | POA: Diagnosis not present

## 2023-11-13 DIAGNOSIS — M542 Cervicalgia: Secondary | ICD-10-CM | POA: Diagnosis not present

## 2023-11-13 DIAGNOSIS — S199XXA Unspecified injury of neck, initial encounter: Secondary | ICD-10-CM | POA: Diagnosis present

## 2023-11-13 DIAGNOSIS — M6281 Muscle weakness (generalized): Secondary | ICD-10-CM | POA: Diagnosis not present

## 2023-11-13 DIAGNOSIS — G319 Degenerative disease of nervous system, unspecified: Secondary | ICD-10-CM | POA: Diagnosis not present

## 2023-11-13 DIAGNOSIS — R2689 Other abnormalities of gait and mobility: Secondary | ICD-10-CM | POA: Diagnosis not present

## 2023-11-13 DIAGNOSIS — I10 Essential (primary) hypertension: Secondary | ICD-10-CM | POA: Diagnosis not present

## 2023-11-13 DIAGNOSIS — Z743 Need for continuous supervision: Secondary | ICD-10-CM | POA: Diagnosis not present

## 2023-11-13 DIAGNOSIS — G309 Alzheimer's disease, unspecified: Secondary | ICD-10-CM | POA: Diagnosis not present

## 2023-11-13 DIAGNOSIS — G44309 Post-traumatic headache, unspecified, not intractable: Secondary | ICD-10-CM | POA: Diagnosis not present

## 2023-11-13 HISTORY — DX: Dementia in other diseases classified elsewhere, unspecified severity, without behavioral disturbance, psychotic disturbance, mood disturbance, and anxiety: G30.9

## 2023-11-13 NOTE — Discharge Instructions (Signed)
 Return to the emergency department for severe headache, seizure/convulsions, significant changes in mental status, or for other new and concerning symptoms.

## 2023-11-13 NOTE — ED Triage Notes (Addendum)
 Pt bib EMS from [facility] after pt fell while ambulating to bathroom. Pt c/o pain to back of head. No use of blood thinners documented in paperwork from facility. Pt placed in C collar by EMS.

## 2023-11-13 NOTE — ED Provider Notes (Addendum)
 [place] EMERGENCY DEPARTMENT AT [facility] Provider Note   CSN: [id number] Arrival date & time: 11/13/23  0240     Patient presents with: Fall   Peggy Ramirez is a 89 y.o. female.   Patient is an 89 year old female with history of dementia.  Patient brought by EMS from her extended care facility after a fall.  I am told she try to get up in the night and walk, then fell backwards and hit the back of her head.  There was no loss of consciousness.  History is somewhat limited from the patient secondary to dementia, but she has no complaints otherwise.       Prior to Admission medications   Medication Sig Start Date End Date Taking? Authorizing Provider  acetaminophen  (TYLENOL ) 500 MG tablet Take 2 tablets (1,000 mg total) by mouth every 6 (six) hours as needed for headache or fever. 07/24/23   Angiulli, Toribio PARAS, PA-C  Acidophilus Lactobacillus CAPS Take 250 mg by mouth 2 (two) times daily. 07/24/23   Angiulli, Toribio PARAS, PA-C  ALPRAZolam  (XANAX ) 0.25 MG tablet Take 1 tablet (0.25 mg total) by mouth 2 (two) times daily as needed for anxiety. 07/24/23   Angiulli, Toribio PARAS, PA-C  amLODipine  (NORVASC ) 2.5 MG tablet TAKE 1 TABLET BY MOUTH DAILY  (DOSE DECREASED) 10/06/23   Alvan Dorn FALCON, MD  Ascorbic Acid  (VITAMIN C ) 1000 MG tablet Take 1 tablet (1,000 mg total) by mouth daily. 07/15/23   Angiulli, Toribio PARAS, PA-C  busPIRone  (BUSPAR ) 5 MG tablet Take 1 tablet (5 mg total) by mouth 2 (two) times daily. 07/24/23   Angiulli, Toribio PARAS, PA-C  Carboxymethylcellulose Sodium (REFRESH LIQUIGEL OP) Place 1 drop into both eyes in the morning, at noon, in the evening, and at bedtime. Gel drops    [provider]  docusate sodium  (COLACE) 100 MG capsule Take 1 capsule (100 mg total) by mouth 2 (two) times daily. 07/15/23   Angiulli, Toribio PARAS, PA-C  loratadine  (CLARITIN ) 10 MG tablet Take 10 mg by mouth daily as needed for allergies.    [provider]  melatonin 5 MG TABS  Take 1 tablet (5 mg total) by mouth at bedtime as needed. 07/24/23   Angiulli, Daniel J, PA-C  Multiple Vitamin (MULTIVITAMIN) tablet Take 1 tablet by mouth daily.    [provider]  polyethylene glycol (MIRALAX  / GLYCOLAX ) 17 g packet Take 17 g by mouth daily. 07/24/23   Angiulli, Toribio PARAS, PA-C  simvastatin  (ZOCOR ) 20 MG tablet Take 1 tablet (20 mg total) by mouth daily. 07/15/23   Angiulli, Toribio PARAS, PA-C  sodium chloride  1 g tablet Take 1 tablet (1 g total) by mouth 2 (two) times daily with a meal. 07/24/23   Angiulli, Toribio PARAS, PA-C    Allergies: Atorvastatin , Cellcept [mycophenolate mofetil], Medrol  [methylprednisolone ], Mycophenolate mofetil, Sulfonamide derivatives, Topiramate , Verapamil, and Levofloxacin    Review of Systems  All other systems reviewed and are negative.   Updated Vital Signs BP (!) 164/84 (BP Location: Right Arm)   Pulse 66   Temp 97.6 F (36.4 C)   Resp 18   Ht 5' (1.524 m)   Wt 43 kg   SpO2 97%   BMI 18.51 kg/m   Physical Exam Vitals and nursing note reviewed.  Constitutional:      General: She is not in acute distress.    Appearance: She is well-developed. She is not diaphoretic.  HENT:     Head: Normocephalic and atraumatic.  Eyes:     Extraocular Movements: Extraocular movements intact.     Pupils: Pupils are equal, round, and reactive to light.  Cardiovascular:     Rate and Rhythm: Normal rate and regular rhythm.     Heart sounds: No murmur heard.    No friction rub. No gallop.  Pulmonary:     Effort: Pulmonary effort is normal. No respiratory distress.     Breath sounds: Normal breath sounds. No wheezing.  Abdominal:     General: Bowel sounds are normal. There is no distension.     Palpations: Abdomen is soft.     Tenderness: There is no abdominal tenderness.  Musculoskeletal:        General: Normal range of motion.     Cervical back: Normal range of motion and neck supple.  Skin:    General: Skin is warm and dry.   Neurological:     General: No focal deficit present.     Mental Status: She is alert and oriented to person, place, and time.     Cranial Nerves: No cranial nerve deficit.     Motor: No weakness.     Coordination: Coordination normal.     (all labs ordered are listed, but only abnormal results are displayed) Labs Reviewed - No data to display  EKG: EKG Interpretation Date/Time:  Friday November 13 2023 02:50:45 EDT Ventricular Rate:  65 PR Interval:  181 QRS Duration:  136 QT Interval:  444 QTC Calculation: 462 R Axis:   -65  Text Interpretation: Sinus rhythm Probable left atrial enlargement Left bundle branch block Confirmed by Geroldine Berg ([id number]) on 11/13/2023 4:58:49 AM  Radiology: CT Head Wo Contrast Result Date: 11/13/2023 CLINICAL DATA:  Recent fall with headaches and neck pain, initial encounter EXAM: CT HEAD WITHOUT CONTRAST CT CERVICAL SPINE WITHOUT CONTRAST TECHNIQUE: Multidetector CT imaging of the head and cervical spine was performed following the standard protocol without intravenous contrast. Multiplanar CT image reconstructions of the cervical spine were also generated. RADIATION DOSE REDUCTION: This exam was performed according to the departmental dose-optimization program which includes automated exposure control, adjustment of the mA and/or kV according to patient size and/or use of iterative reconstruction technique. COMPARISON:  07/22/2023 FINDINGS: CT HEAD FINDINGS Brain: No evidence of acute infarction, hemorrhage, hydrocephalus, extra-axial collection or mass lesion/mass effect. Chronic atrophic and ischemic changes are again seen. Previously noted subdural hematoma has resolved in the interval. Vascular: No hyperdense vessel or unexpected calcification. Hyperdense material is noted in the distribution of the right middle meningeal artery consistent with prior embolus therapy. Skull: Normal. Negative for fracture or focal lesion. Sinuses/Orbits: No acute finding.  Other: Mild scalp hematoma is noted near the vertex in the left parietal region CT CERVICAL SPINE FINDINGS Alignment: Within normal limits. Skull base and vertebrae: 7 cervical segments are well visualized. Vertebral body height is well maintained. Mild osteophytic changes are noted as well as facet hypertrophic changes. No acute fracture or acute facet abnormality is noted. Soft tissues and spinal canal: Surrounding soft tissue structures are within normal limits. Upper chest: Visualized lung apices are unremarkable. Other: None IMPRESSION: CT of the head: Chronic atrophic and ischemic changes. Changes of prior embolization in the right middle meningeal artery. CT of the cervical spine: Multilevel degenerative change without acute abnormality. Electronically Signed   By: Oneil Devonshire M.D.   On: 11/13/2023 03:30   CT Cervical Spine Wo Contrast Result Date: 11/13/2023 CLINICAL DATA:  Recent fall with headaches and neck pain, initial encounter  EXAM: CT HEAD WITHOUT CONTRAST CT CERVICAL SPINE WITHOUT CONTRAST TECHNIQUE: Multidetector CT imaging of the head and cervical spine was performed following the standard protocol without intravenous contrast. Multiplanar CT image reconstructions of the cervical spine were also generated. RADIATION DOSE REDUCTION: This exam was performed according to the departmental dose-optimization program which includes automated exposure control, adjustment of the mA and/or kV according to patient size and/or use of iterative reconstruction technique. COMPARISON:  07/22/2023 FINDINGS: CT HEAD FINDINGS Brain: No evidence of acute infarction, hemorrhage, hydrocephalus, extra-axial collection or mass lesion/mass effect. Chronic atrophic and ischemic changes are again seen. Previously noted subdural hematoma has resolved in the interval. Vascular: No hyperdense vessel or unexpected calcification. Hyperdense material is noted in the distribution of the right middle meningeal artery consistent  with prior embolus therapy. Skull: Normal. Negative for fracture or focal lesion. Sinuses/Orbits: No acute finding. Other: Mild scalp hematoma is noted near the vertex in the left parietal region CT CERVICAL SPINE FINDINGS Alignment: Within normal limits. Skull base and vertebrae: 7 cervical segments are well visualized. Vertebral body height is well maintained. Mild osteophytic changes are noted as well as facet hypertrophic changes. No acute fracture or acute facet abnormality is noted. Soft tissues and spinal canal: Surrounding soft tissue structures are within normal limits. Upper chest: Visualized lung apices are unremarkable. Other: None IMPRESSION: CT of the head: Chronic atrophic and ischemic changes. Changes of prior embolization in the right middle meningeal artery. CT of the cervical spine: Multilevel degenerative change without acute abnormality. Electronically Signed   By: Oneil Devonshire M.D.   On: 11/13/2023 03:30     Procedures   Medications Ordered in the ED - No data to display                                  Medical Decision Making Amount and/or Complexity of Data Reviewed Radiology: ordered.   CT scan of the head and cervical spine both unremarkable.  Patient to be discharged with as needed return and head injury precautions.     Final diagnoses:  None    ED Discharge Orders     None          Geroldine Berg, MD 11/13/23 [id number]    Geroldine Berg, MD 11/13/23 [phone]

## 2023-11-15 DIAGNOSIS — S065XAA Traumatic subdural hemorrhage with loss of consciousness status unknown, initial encounter: Secondary | ICD-10-CM | POA: Diagnosis not present

## 2023-11-16 DIAGNOSIS — R278 Other lack of coordination: Secondary | ICD-10-CM | POA: Diagnosis not present

## 2023-11-16 DIAGNOSIS — G9341 Metabolic encephalopathy: Secondary | ICD-10-CM | POA: Diagnosis not present

## 2023-11-16 DIAGNOSIS — I69891 Dysphagia following other cerebrovascular disease: Secondary | ICD-10-CM | POA: Diagnosis not present

## 2023-11-16 DIAGNOSIS — M6281 Muscle weakness (generalized): Secondary | ICD-10-CM | POA: Diagnosis not present

## 2023-11-16 DIAGNOSIS — R2689 Other abnormalities of gait and mobility: Secondary | ICD-10-CM | POA: Diagnosis not present

## 2023-11-16 DIAGNOSIS — G309 Alzheimer's disease, unspecified: Secondary | ICD-10-CM | POA: Diagnosis not present

## 2023-11-17 DIAGNOSIS — G309 Alzheimer's disease, unspecified: Secondary | ICD-10-CM | POA: Diagnosis not present

## 2023-11-17 DIAGNOSIS — R278 Other lack of coordination: Secondary | ICD-10-CM | POA: Diagnosis not present

## 2023-11-17 DIAGNOSIS — I69891 Dysphagia following other cerebrovascular disease: Secondary | ICD-10-CM | POA: Diagnosis not present

## 2023-11-17 DIAGNOSIS — G9341 Metabolic encephalopathy: Secondary | ICD-10-CM | POA: Diagnosis not present

## 2023-11-17 DIAGNOSIS — R2689 Other abnormalities of gait and mobility: Secondary | ICD-10-CM | POA: Diagnosis not present

## 2023-11-17 DIAGNOSIS — M6281 Muscle weakness (generalized): Secondary | ICD-10-CM | POA: Diagnosis not present

## 2023-11-18 DIAGNOSIS — M6281 Muscle weakness (generalized): Secondary | ICD-10-CM | POA: Diagnosis not present

## 2023-11-18 DIAGNOSIS — G309 Alzheimer's disease, unspecified: Secondary | ICD-10-CM | POA: Diagnosis not present

## 2023-11-18 DIAGNOSIS — R2689 Other abnormalities of gait and mobility: Secondary | ICD-10-CM | POA: Diagnosis not present

## 2023-11-18 DIAGNOSIS — G9341 Metabolic encephalopathy: Secondary | ICD-10-CM | POA: Diagnosis not present

## 2023-11-18 DIAGNOSIS — I69891 Dysphagia following other cerebrovascular disease: Secondary | ICD-10-CM | POA: Diagnosis not present

## 2023-11-18 DIAGNOSIS — R059 Cough, unspecified: Secondary | ICD-10-CM | POA: Diagnosis not present

## 2023-11-18 DIAGNOSIS — R278 Other lack of coordination: Secondary | ICD-10-CM | POA: Diagnosis not present

## 2023-11-19 DIAGNOSIS — E119 Type 2 diabetes mellitus without complications: Secondary | ICD-10-CM | POA: Diagnosis not present

## 2023-11-19 DIAGNOSIS — R278 Other lack of coordination: Secondary | ICD-10-CM | POA: Diagnosis not present

## 2023-11-19 DIAGNOSIS — J9600 Acute respiratory failure, unspecified whether with hypoxia or hypercapnia: Secondary | ICD-10-CM | POA: Diagnosis not present

## 2023-11-19 DIAGNOSIS — I10 Essential (primary) hypertension: Secondary | ICD-10-CM | POA: Diagnosis not present

## 2023-11-19 DIAGNOSIS — I69891 Dysphagia following other cerebrovascular disease: Secondary | ICD-10-CM | POA: Diagnosis not present

## 2023-11-19 DIAGNOSIS — G309 Alzheimer's disease, unspecified: Secondary | ICD-10-CM | POA: Diagnosis not present

## 2023-11-19 DIAGNOSIS — G9341 Metabolic encephalopathy: Secondary | ICD-10-CM | POA: Diagnosis not present

## 2023-11-19 DIAGNOSIS — Z130 Encounter for screening for diseases of the blood and blood-forming organs and certain disorders involving the immune mechanism: Secondary | ICD-10-CM | POA: Diagnosis not present

## 2023-11-19 DIAGNOSIS — R2689 Other abnormalities of gait and mobility: Secondary | ICD-10-CM | POA: Diagnosis not present

## 2023-11-19 DIAGNOSIS — Z13220 Encounter for screening for lipoid disorders: Secondary | ICD-10-CM | POA: Diagnosis not present

## 2023-11-19 DIAGNOSIS — R1110 Vomiting, unspecified: Secondary | ICD-10-CM | POA: Diagnosis not present

## 2023-11-19 DIAGNOSIS — M6281 Muscle weakness (generalized): Secondary | ICD-10-CM | POA: Diagnosis not present

## 2023-11-20 DIAGNOSIS — G9341 Metabolic encephalopathy: Secondary | ICD-10-CM | POA: Diagnosis not present

## 2023-11-20 DIAGNOSIS — G309 Alzheimer's disease, unspecified: Secondary | ICD-10-CM | POA: Diagnosis not present

## 2023-11-24 DIAGNOSIS — I69891 Dysphagia following other cerebrovascular disease: Secondary | ICD-10-CM | POA: Diagnosis not present

## 2023-11-24 DIAGNOSIS — R2689 Other abnormalities of gait and mobility: Secondary | ICD-10-CM | POA: Diagnosis not present

## 2023-11-24 DIAGNOSIS — G309 Alzheimer's disease, unspecified: Secondary | ICD-10-CM | POA: Diagnosis not present

## 2023-11-24 DIAGNOSIS — G9341 Metabolic encephalopathy: Secondary | ICD-10-CM | POA: Diagnosis not present

## 2023-11-24 DIAGNOSIS — M6281 Muscle weakness (generalized): Secondary | ICD-10-CM | POA: Diagnosis not present

## 2023-11-24 DIAGNOSIS — R278 Other lack of coordination: Secondary | ICD-10-CM | POA: Diagnosis not present

## 2023-11-25 DIAGNOSIS — R278 Other lack of coordination: Secondary | ICD-10-CM | POA: Diagnosis not present

## 2023-11-25 DIAGNOSIS — I69891 Dysphagia following other cerebrovascular disease: Secondary | ICD-10-CM | POA: Diagnosis not present

## 2023-11-25 DIAGNOSIS — R2689 Other abnormalities of gait and mobility: Secondary | ICD-10-CM | POA: Diagnosis not present

## 2023-11-25 DIAGNOSIS — G9341 Metabolic encephalopathy: Secondary | ICD-10-CM | POA: Diagnosis not present

## 2023-11-25 DIAGNOSIS — G309 Alzheimer's disease, unspecified: Secondary | ICD-10-CM | POA: Diagnosis not present

## 2023-11-25 DIAGNOSIS — M6281 Muscle weakness (generalized): Secondary | ICD-10-CM | POA: Diagnosis not present

## 2023-11-27 DIAGNOSIS — G309 Alzheimer's disease, unspecified: Secondary | ICD-10-CM | POA: Diagnosis not present

## 2023-11-27 DIAGNOSIS — G9341 Metabolic encephalopathy: Secondary | ICD-10-CM | POA: Diagnosis not present

## 2023-11-27 DIAGNOSIS — I69891 Dysphagia following other cerebrovascular disease: Secondary | ICD-10-CM | POA: Diagnosis not present

## 2023-11-28 ENCOUNTER — Other Ambulatory Visit: Payer: Self-pay

## 2023-11-28 ENCOUNTER — Emergency Department (HOSPITAL_COMMUNITY)

## 2023-11-28 ENCOUNTER — Encounter (HOSPITAL_COMMUNITY): Payer: Self-pay

## 2023-11-28 ENCOUNTER — Emergency Department (HOSPITAL_COMMUNITY)
Admission: EM | Admit: 2023-11-28 | Discharge: 2023-11-28 | Disposition: A | Discharge status: Home / Self Care | Source: Skilled Nursing Facility

## 2023-11-28 DIAGNOSIS — M19022 Primary osteoarthritis, left elbow: Secondary | ICD-10-CM | POA: Diagnosis not present

## 2023-11-28 DIAGNOSIS — Z743 Need for continuous supervision: Secondary | ICD-10-CM | POA: Diagnosis not present

## 2023-11-28 DIAGNOSIS — I6203 Nontraumatic chronic subdural hemorrhage: Secondary | ICD-10-CM | POA: Diagnosis not present

## 2023-11-28 DIAGNOSIS — R9082 White matter disease, unspecified: Secondary | ICD-10-CM | POA: Diagnosis not present

## 2023-11-28 DIAGNOSIS — R9389 Abnormal findings on diagnostic imaging of other specified body structures: Secondary | ICD-10-CM | POA: Diagnosis not present

## 2023-11-28 DIAGNOSIS — W19XXXA Unspecified fall, initial encounter: Secondary | ICD-10-CM

## 2023-11-28 DIAGNOSIS — W010XXA Fall on same level from slipping, tripping and stumbling without subsequent striking against object, initial encounter: Secondary | ICD-10-CM | POA: Diagnosis not present

## 2023-11-28 DIAGNOSIS — I6529 Occlusion and stenosis of unspecified carotid artery: Secondary | ICD-10-CM | POA: Diagnosis not present

## 2023-11-28 DIAGNOSIS — Z79899 Other long term (current) drug therapy: Secondary | ICD-10-CM | POA: Insufficient documentation

## 2023-11-28 DIAGNOSIS — S0990XA Unspecified injury of head, initial encounter: Secondary | ICD-10-CM | POA: Diagnosis not present

## 2023-11-28 DIAGNOSIS — M79632 Pain in left forearm: Secondary | ICD-10-CM | POA: Diagnosis not present

## 2023-11-28 DIAGNOSIS — S50312A Abrasion of left elbow, initial encounter: Secondary | ICD-10-CM | POA: Insufficient documentation

## 2023-11-28 DIAGNOSIS — R0989 Other specified symptoms and signs involving the circulatory and respiratory systems: Secondary | ICD-10-CM | POA: Diagnosis not present

## 2023-11-28 DIAGNOSIS — R0781 Pleurodynia: Secondary | ICD-10-CM | POA: Diagnosis not present

## 2023-11-28 DIAGNOSIS — M4856XA Collapsed vertebra, not elsewhere classified, lumbar region, initial encounter for fracture: Secondary | ICD-10-CM | POA: Diagnosis not present

## 2023-11-28 DIAGNOSIS — Z043 Encounter for examination and observation following other accident: Secondary | ICD-10-CM | POA: Diagnosis not present

## 2023-11-28 NOTE — ED Notes (Signed)
 Attempted to call report not answer by nurse, Patient taken back to facility by son Peggy Ramirez.

## 2023-11-28 NOTE — ED Triage Notes (Signed)
 Patient Arrived by [facility] EMS from [facility]. Fell 2 hours ago. Unwitnessed but staff heard it and found patient on floor. Skin tear to left elbow and red mark top of head. Patient c/o of pain 2/10 to left foream. Ambulatory with assist to bed. Facility BP 140/120. EMS did manual BP 150/70. 98% RA 88 HR 18 RR. DNR comfort measures only at bedside.

## 2023-11-28 NOTE — ED Provider Notes (Signed)
 [place] EMERGENCY DEPARTMENT AT [facility] Provider Note   CSN: [id number] Arrival date & time: 11/28/23  9149     Patient presents with: Fall   Peggy Ramirez is a 89 y.o. female.    Fall Pertinent negatives include no chest pain, no abdominal pain and no shortness of breath.   Presents because of mechanical fall.  Patient states that she fell out of facility.  EMS states that they were told that it was mechanical in nature.  Patient states that she was walking and subsequently tripped.  Does not think she hit her head.  Takes no anticoagulation.  Denies any current neck or back pain.  No numbness or tingling aware.  Endorses abrasion to the left elbow.  Has any chest pain or shortness of breath.  Previous medical history reviewed : Patient was last seen on July 18 because of fall.  Normal workup.        Prior to Admission medications   Medication Sig Start Date End Date Taking? Authorizing Provider  acetaminophen  (TYLENOL ) 500 MG tablet Take 2 tablets (1,000 mg total) by mouth every 6 (six) hours as needed for headache or fever. 07/24/23   Angiulli, Toribio PARAS, PA-C  Acidophilus Lactobacillus CAPS Take 250 mg by mouth 2 (two) times daily. 07/24/23   Angiulli, Toribio PARAS, PA-C  ALPRAZolam  (XANAX ) 0.25 MG tablet Take 1 tablet (0.25 mg total) by mouth 2 (two) times daily as needed for anxiety. 07/24/23   Angiulli, Toribio PARAS, PA-C  amLODipine  (NORVASC ) 2.5 MG tablet TAKE 1 TABLET BY MOUTH DAILY  (DOSE DECREASED) 10/06/23   Alvan Dorn FALCON, MD  Ascorbic Acid  (VITAMIN C ) 1000 MG tablet Take 1 tablet (1,000 mg total) by mouth daily. 07/15/23   Angiulli, Toribio PARAS, PA-C  busPIRone  (BUSPAR ) 5 MG tablet Take 1 tablet (5 mg total) by mouth 2 (two) times daily. 07/24/23   Angiulli, Toribio PARAS, PA-C  Carboxymethylcellulose Sodium (REFRESH LIQUIGEL OP) Place 1 drop into both eyes in the morning, at noon, in the evening, and at bedtime. Gel drops    [provider]  docusate sodium   (COLACE) 100 MG capsule Take 1 capsule (100 mg total) by mouth 2 (two) times daily. 07/15/23   Angiulli, Toribio PARAS, PA-C  loratadine  (CLARITIN ) 10 MG tablet Take 10 mg by mouth daily as needed for allergies.    [provider]  melatonin 5 MG TABS Take 1 tablet (5 mg total) by mouth at bedtime as needed. 07/24/23   Angiulli, Daniel J, PA-C  Multiple Vitamin (MULTIVITAMIN) tablet Take 1 tablet by mouth daily.    [provider]  polyethylene glycol (MIRALAX  / GLYCOLAX ) 17 g packet Take 17 g by mouth daily. 07/24/23   Angiulli, Toribio PARAS, PA-C  simvastatin  (ZOCOR ) 20 MG tablet Take 1 tablet (20 mg total) by mouth daily. 07/15/23   Angiulli, Daniel J, PA-C  sodium chloride  1 g tablet Take 1 tablet (1 g total) by mouth 2 (two) times daily with a meal. 07/24/23   Angiulli, Toribio PARAS, PA-C    Allergies: Atorvastatin , Cellcept [mycophenolate mofetil], Medrol  [methylprednisolone ], Mycophenolate mofetil, Sulfonamide derivatives, Topiramate , Verapamil, and Levofloxacin    Review of Systems  Constitutional:  Negative for chills and fever.  HENT:  Negative for ear pain and sore throat.   Eyes:  Negative for pain and visual disturbance.  Respiratory:  Negative for cough and shortness of breath.   Cardiovascular:  Negative for chest pain and palpitations.  Gastrointestinal:  Negative for  abdominal pain and vomiting.  Genitourinary:  Negative for dysuria and hematuria.  Musculoskeletal:  Negative for arthralgias and back pain.  Skin:  Negative for color change and rash.  Neurological:  Negative for seizures and syncope.  All other systems reviewed and are negative.   Updated Vital Signs BP 130/63   Pulse 68   Temp 98.6 F (37 C) (Oral)   Resp (!) 21   Ht 5' (1.524 m) Comment: Simultaneous filing. User may not have seen previous data.  Wt 50.6 kg   SpO2 99%   BMI 21.80 kg/m   Physical Exam Vitals and nursing note reviewed.  Constitutional:      General: She is not in acute  distress.    Appearance: She is well-developed.  HENT:     Head: Normocephalic and atraumatic.  Eyes:     Conjunctiva/sclera: Conjunctivae normal.  Cardiovascular:     Rate and Rhythm: Normal rate and regular rhythm.     Heart sounds: No murmur heard. Pulmonary:     Effort: Pulmonary effort is normal. No respiratory distress.     Breath sounds: Normal breath sounds.  Abdominal:     Palpations: Abdomen is soft.     Tenderness: There is no abdominal tenderness.  Musculoskeletal:        General: No swelling.       Arms:     Cervical back: Neck supple.  Skin:    General: Skin is warm and dry.     Capillary Refill: Capillary refill takes less than 2 seconds.  Neurological:     Mental Status: She is alert.  Psychiatric:        Mood and Affect: Mood normal.     (all labs ordered are listed, but only abnormal results are displayed) Labs Reviewed - No data to display  EKG: None  Radiology: DG Shoulder Left Result Date: 11/28/2023 CLINICAL DATA:  Unwitnessed fall. Blunt trauma. Left-sided rib pain. EXAM: LEFT SHOULDER - 2+ VIEW COMPARISON:  None Available. FINDINGS: No acute fracture or dislocation. No aggressive osseous lesion. Glenohumeral and acromioclavicular joints are normal in alignment and exhibit mild-to-moderate degenerative changes. No soft tissue swelling. No radiopaque foreign bodies. IMPRESSION: No acute osseous abnormality of the left shoulder joint. Electronically Signed   By: Ree Molt M.D.   On: 11/28/2023 09:52   DG Elbow 2 Views Left Result Date: 11/28/2023 CLINICAL DATA:  Unwitnessed fall.  Left forearm pain. EXAM: LEFT ELBOW - 2 VIEW COMPARISON:  None Available. FINDINGS: No acute fracture or dislocation. No aggressive osseous lesion. Mild degenerative arthritis of elbow joint. Periarticular chondrocalcinosis noted. No radiopaque foreign bodies. Soft tissues are within normal limits. IMPRESSION: No acute osseous abnormality of the left elbow joint.  Electronically Signed   By: Ree Molt M.D.   On: 11/28/2023 09:51   DG Chest Portable 1 View Result Date: 11/28/2023 CLINICAL DATA:  Unwitnessed fall. EXAM: PORTABLE CHEST 1 VIEW COMPARISON:  07/24/2023. FINDINGS: Low lung volume. Mild diffuse increased interstitial markings are nonspecific and may be exaggerated by low lung volume. No frank pulmonary edema. Probable left retrocardiac atelectasis/scarring. Bilateral lung fields are otherwise clear. Note is made of elevated right hemidiaphragm. Bilateral costophrenic angles are clear. Stable cardio-mediastinal silhouette. No acute osseous abnormalities. The soft tissues are within normal limits. IMPRESSION: *No active disease. Electronically Signed   By: Ree Molt M.D.   On: 11/28/2023 09:50   CT Cervical Spine Wo Contrast Result Date: 11/28/2023 CLINICAL DATA:  89 year old female status post unwitnessed fall.  Found down. EXAM: CT CERVICAL SPINE WITHOUT CONTRAST TECHNIQUE: Multidetector CT imaging of the cervical spine was performed without intravenous contrast. Multiplanar CT image reconstructions were also generated. RADIATION DOSE REDUCTION: This exam was performed according to the departmental dose-optimization program which includes automated exposure control, adjustment of the mA and/or kV according to patient size and/or use of iterative reconstruction technique. COMPARISON:  Head CT today.  Cervical spine CT 11/13/2023. FINDINGS: Alignment: Stable straightening of lordosis. Cervicothoracic junction alignment is within normal limits. Bilateral posterior element alignment is within normal limits. Skull base and vertebrae: Stable bone mineralization. Visualized skull base is intact. No atlanto-occipital dissociation. C1 and C2 appear chronically degenerated, intact and aligned. No acute osseous abnormality identified. Soft tissues and spinal canal: No prevertebral fluid or swelling. No visible canal hematoma. Calcified cervical carotid  atherosclerosis. Disc levels: Stable from last month. 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 Advanced disc, endplate, facet degeneration elsewhere. Upper chest: Chronic T3 superior endplate compression is stable. Other visible upper thoracic levels appear intact. Negative lung apices. IMPRESSION: 1. No acute traumatic injury identified in the cervical spine. 2. Stable cervical spine degeneration superimposed on degenerative ankylosis C4 through C6. 3. Chronic T3 compression fracture. Electronically Signed   By: VEAR Hurst M.D.   On: 11/28/2023 09:40   CT Head Wo Contrast Result Date: 11/28/2023 CLINICAL DATA:  89 year old female status post unwitnessed fall. Found down. Soft tissue injury. EXAM: CT HEAD WITHOUT CONTRAST TECHNIQUE: Contiguous axial images were obtained from the base of the skull through the vertex without intravenous contrast. RADIATION DOSE REDUCTION: This exam was performed according to the departmental dose-optimization program which includes automated exposure control, adjustment of the mA and/or kV according to patient size and/or use of iterative reconstruction technique. COMPARISON:  Head CT 11/13/2023. FINDINGS: Brain: Stable cerebral volume. Minimal chronic right superior convexity subdural hematoma is stable, previous right middle meningeal artery embolization again noted. No acute intracranial hemorrhage. No intracranial mass effect or midline shift. Stable ventricle size and configuration. Confluent chronic periventricular white matter hypodensity. Mild chronic anterior left inferior frontal gyrus encephalomalacia. Chronic right middle temporal gyrus encephalomalacia. Stable gray-white differentiation, no cortically based acute infarct identified. Vascular: Right middle meningeal artery embolic material. Calcified atherosclerosis at the skull base. No suspicious intracranial vascular hyperdensity. Skull: Appears stable and intact. Sinuses/Orbits: Visualized paranasal  sinuses and mastoids are stable and well aerated. Other: Left vertex scalp hematoma last month has largely resolved. No new orbit or scalp soft tissue injury identified. IMPRESSION: 1. No acute intracranial abnormality or acute traumatic injury identified. 2. Chronic right middle meningeal artery embolization, minimal chronic right vertex SDH. 3. Stable chronic white matter disease, and small areas of chronic frontotemporal encephalomalacia which may be posttraumatic. Electronically Signed   By: VEAR Hurst M.D.   On: 11/28/2023 09:38     Procedures   Medications Ordered in the ED - No data to display                                  Medical Decision Making Amount and/or Complexity of Data Reviewed Radiology: ordered.   Presents because of mechanical fall.  Patient states that she fell out of facility.  EMS states that they were told that it was mechanical in nature.  Patient states that she was walking and subsequently tripped.  Does not think she hit her head.  Takes no anticoagulation.  Denies any current neck or back  pain.  No numbness or tingling aware.  Endorses abrasion to the left elbow.  Has any chest pain or shortness of breath.  Previous medical history reviewed : Patient was last seen on July 18 because of fall.  Normal workup.    On exam, patient alert to self and place but not time.  This appears to be patient baseline.  No obvious trauma to the head.  No large hematoma.  No lacerations seen.    does have pain to palpation of his to the left shoulder as well as left elbow.  No obvious physical deformity.  Obtain x-ray of the left shoulder, left elbow as well as chest.  Unremarkable.  No pneumothorax.  No rib fractures.  No osseous fractures.  Also obtain CT cervical spine as well as CT head.  Unremarkable.  No large abrasions or lacerations that need to be repaired at this time.  Do think this is likely mechanical in nature.  No further laboratory or electrolyte workup  needed at this point time.      Final diagnoses:  Fall, initial encounter  Injury of head, initial encounter    ED Discharge Orders     None          Simon Lavonia SAILOR, MD 11/28/23 [phone]

## 2023-11-28 NOTE — Discharge Instructions (Addendum)
 Workup today did not show an Kuneff acute fracture.  No evidence of any intracranial bleed.  No neck injury.   Keep the abrasions clean.

## 2023-11-28 NOTE — ED Notes (Signed)
 Placed on cardiac monitoring

## 2023-11-28 NOTE — ED Notes (Signed)
 ED Provider at bedside.

## 2023-11-28 NOTE — ED Notes (Signed)
Attempted to call report to [facility] no answer

## 2023-11-30 DIAGNOSIS — E559 Vitamin D deficiency, unspecified: Secondary | ICD-10-CM | POA: Diagnosis not present

## 2023-11-30 DIAGNOSIS — G309 Alzheimer's disease, unspecified: Secondary | ICD-10-CM | POA: Diagnosis not present

## 2023-11-30 DIAGNOSIS — Z136 Encounter for screening for cardiovascular disorders: Secondary | ICD-10-CM | POA: Diagnosis not present

## 2023-11-30 DIAGNOSIS — G9341 Metabolic encephalopathy: Secondary | ICD-10-CM | POA: Diagnosis not present

## 2023-11-30 DIAGNOSIS — R269 Unspecified abnormalities of gait and mobility: Secondary | ICD-10-CM | POA: Diagnosis not present

## 2023-12-01 DIAGNOSIS — I69891 Dysphagia following other cerebrovascular disease: Secondary | ICD-10-CM | POA: Diagnosis not present

## 2023-12-01 DIAGNOSIS — G309 Alzheimer's disease, unspecified: Secondary | ICD-10-CM | POA: Diagnosis not present

## 2023-12-01 DIAGNOSIS — G9341 Metabolic encephalopathy: Secondary | ICD-10-CM | POA: Diagnosis not present

## 2023-12-02 DIAGNOSIS — G9341 Metabolic encephalopathy: Secondary | ICD-10-CM | POA: Diagnosis not present

## 2023-12-02 DIAGNOSIS — R112 Nausea with vomiting, unspecified: Secondary | ICD-10-CM | POA: Diagnosis not present

## 2023-12-02 DIAGNOSIS — Z8673 Personal history of transient ischemic attack (TIA), and cerebral infarction without residual deficits: Secondary | ICD-10-CM | POA: Diagnosis not present

## 2023-12-02 DIAGNOSIS — I69891 Dysphagia following other cerebrovascular disease: Secondary | ICD-10-CM | POA: Diagnosis not present

## 2023-12-02 DIAGNOSIS — G309 Alzheimer's disease, unspecified: Secondary | ICD-10-CM | POA: Diagnosis not present

## 2023-12-04 DIAGNOSIS — G9341 Metabolic encephalopathy: Secondary | ICD-10-CM | POA: Diagnosis not present

## 2023-12-04 DIAGNOSIS — I69891 Dysphagia following other cerebrovascular disease: Secondary | ICD-10-CM | POA: Diagnosis not present

## 2023-12-04 DIAGNOSIS — G309 Alzheimer's disease, unspecified: Secondary | ICD-10-CM | POA: Diagnosis not present

## 2023-12-08 DIAGNOSIS — I69891 Dysphagia following other cerebrovascular disease: Secondary | ICD-10-CM | POA: Diagnosis not present

## 2023-12-08 DIAGNOSIS — G309 Alzheimer's disease, unspecified: Secondary | ICD-10-CM | POA: Diagnosis not present

## 2023-12-08 DIAGNOSIS — G9341 Metabolic encephalopathy: Secondary | ICD-10-CM | POA: Diagnosis not present

## 2023-12-10 DIAGNOSIS — Z961 Presence of intraocular lens: Secondary | ICD-10-CM | POA: Diagnosis not present

## 2023-12-10 DIAGNOSIS — H02834 Dermatochalasis of left upper eyelid: Secondary | ICD-10-CM | POA: Diagnosis not present

## 2023-12-10 DIAGNOSIS — H01005 Unspecified blepharitis left lower eyelid: Secondary | ICD-10-CM | POA: Diagnosis not present

## 2023-12-10 DIAGNOSIS — H01002 Unspecified blepharitis right lower eyelid: Secondary | ICD-10-CM | POA: Diagnosis not present

## 2023-12-10 DIAGNOSIS — H02822 Cysts of right lower eyelid: Secondary | ICD-10-CM | POA: Diagnosis not present

## 2023-12-10 DIAGNOSIS — H11823 Conjunctivochalasis, bilateral: Secondary | ICD-10-CM | POA: Diagnosis not present

## 2023-12-10 DIAGNOSIS — H04123 Dry eye syndrome of bilateral lacrimal glands: Secondary | ICD-10-CM | POA: Diagnosis not present

## 2023-12-10 DIAGNOSIS — R112 Nausea with vomiting, unspecified: Secondary | ICD-10-CM | POA: Diagnosis not present

## 2023-12-10 DIAGNOSIS — I69891 Dysphagia following other cerebrovascular disease: Secondary | ICD-10-CM | POA: Diagnosis not present

## 2023-12-10 DIAGNOSIS — G309 Alzheimer's disease, unspecified: Secondary | ICD-10-CM | POA: Diagnosis not present

## 2023-12-10 DIAGNOSIS — G9341 Metabolic encephalopathy: Secondary | ICD-10-CM | POA: Diagnosis not present

## 2023-12-10 DIAGNOSIS — H01004 Unspecified blepharitis left upper eyelid: Secondary | ICD-10-CM | POA: Diagnosis not present

## 2023-12-10 DIAGNOSIS — H01001 Unspecified blepharitis right upper eyelid: Secondary | ICD-10-CM | POA: Diagnosis not present

## 2023-12-10 DIAGNOSIS — H02831 Dermatochalasis of right upper eyelid: Secondary | ICD-10-CM | POA: Diagnosis not present

## 2023-12-11 DIAGNOSIS — G9341 Metabolic encephalopathy: Secondary | ICD-10-CM | POA: Diagnosis not present

## 2023-12-11 DIAGNOSIS — I69891 Dysphagia following other cerebrovascular disease: Secondary | ICD-10-CM | POA: Diagnosis not present

## 2023-12-11 DIAGNOSIS — G309 Alzheimer's disease, unspecified: Secondary | ICD-10-CM | POA: Diagnosis not present

## 2023-12-14 DIAGNOSIS — G309 Alzheimer's disease, unspecified: Secondary | ICD-10-CM | POA: Diagnosis not present

## 2023-12-14 DIAGNOSIS — G9341 Metabolic encephalopathy: Secondary | ICD-10-CM | POA: Diagnosis not present

## 2023-12-14 DIAGNOSIS — I69891 Dysphagia following other cerebrovascular disease: Secondary | ICD-10-CM | POA: Diagnosis not present

## 2023-12-15 DIAGNOSIS — N390 Urinary tract infection, site not specified: Secondary | ICD-10-CM | POA: Diagnosis not present

## 2023-12-15 DIAGNOSIS — G9341 Metabolic encephalopathy: Secondary | ICD-10-CM | POA: Diagnosis not present

## 2023-12-15 DIAGNOSIS — G309 Alzheimer's disease, unspecified: Secondary | ICD-10-CM | POA: Diagnosis not present

## 2023-12-15 DIAGNOSIS — I69891 Dysphagia following other cerebrovascular disease: Secondary | ICD-10-CM | POA: Diagnosis not present

## 2023-12-16 DIAGNOSIS — G9341 Metabolic encephalopathy: Secondary | ICD-10-CM | POA: Diagnosis not present

## 2023-12-16 DIAGNOSIS — G309 Alzheimer's disease, unspecified: Secondary | ICD-10-CM | POA: Diagnosis not present

## 2023-12-16 DIAGNOSIS — M329 Systemic lupus erythematosus, unspecified: Secondary | ICD-10-CM | POA: Diagnosis not present

## 2023-12-16 DIAGNOSIS — S065XAA Traumatic subdural hemorrhage with loss of consciousness status unknown, initial encounter: Secondary | ICD-10-CM | POA: Diagnosis not present

## 2023-12-18 DIAGNOSIS — I69891 Dysphagia following other cerebrovascular disease: Secondary | ICD-10-CM | POA: Diagnosis not present

## 2023-12-18 DIAGNOSIS — G9341 Metabolic encephalopathy: Secondary | ICD-10-CM | POA: Diagnosis not present

## 2023-12-18 DIAGNOSIS — G309 Alzheimer's disease, unspecified: Secondary | ICD-10-CM | POA: Diagnosis not present

## 2023-12-20 ENCOUNTER — Emergency Department (HOSPITAL_COMMUNITY)

## 2023-12-20 ENCOUNTER — Other Ambulatory Visit: Payer: Self-pay

## 2023-12-20 ENCOUNTER — Encounter (HOSPITAL_COMMUNITY): Payer: Self-pay | Admitting: Emergency Medicine

## 2023-12-20 ENCOUNTER — Emergency Department (HOSPITAL_COMMUNITY)
Admission: EM | Admit: 2023-12-20 | Discharge: 2023-12-21 | Disposition: A | Discharge status: Skilled Nursing Facility | Source: Skilled Nursing Facility | Attending: Student | Admitting: Student

## 2023-12-20 DIAGNOSIS — F02818 Dementia in other diseases classified elsewhere, unspecified severity, with other behavioral disturbance: Secondary | ICD-10-CM | POA: Diagnosis not present

## 2023-12-20 DIAGNOSIS — Z743 Need for continuous supervision: Secondary | ICD-10-CM | POA: Diagnosis not present

## 2023-12-20 DIAGNOSIS — G301 Alzheimer's disease with late onset: Secondary | ICD-10-CM | POA: Diagnosis not present

## 2023-12-20 DIAGNOSIS — Z79899 Other long term (current) drug therapy: Secondary | ICD-10-CM | POA: Diagnosis not present

## 2023-12-20 DIAGNOSIS — I10 Essential (primary) hypertension: Secondary | ICD-10-CM | POA: Insufficient documentation

## 2023-12-20 DIAGNOSIS — G9389 Other specified disorders of brain: Secondary | ICD-10-CM | POA: Diagnosis not present

## 2023-12-20 DIAGNOSIS — R404 Transient alteration of awareness: Secondary | ICD-10-CM | POA: Insufficient documentation

## 2023-12-20 DIAGNOSIS — G309 Alzheimer's disease, unspecified: Secondary | ICD-10-CM | POA: Diagnosis not present

## 2023-12-20 DIAGNOSIS — R4182 Altered mental status, unspecified: Secondary | ICD-10-CM | POA: Diagnosis present

## 2023-12-20 LAB — RAPID URINE DRUG SCREEN, HOSP PERFORMED
Amphetamines: NOT DETECTED
Barbiturates: NOT DETECTED
Benzodiazepines: NOT DETECTED
Cocaine: NOT DETECTED
Opiates: NOT DETECTED
Tetrahydrocannabinol: NOT DETECTED

## 2023-12-20 LAB — CBC
HCT: 40.6 % (ref 36.0–46.0)
Hemoglobin: 13.4 g/dL (ref 12.0–15.0)
MCH: 31.8 pg (ref 26.0–34.0)
MCHC: 33 g/dL (ref 30.0–36.0)
MCV: 96.4 fL (ref 80.0–100.0)
Platelets: 242 K/uL (ref 150–400)
RBC: 4.21 MIL/uL (ref 3.87–5.11)
RDW: 13 % (ref 11.5–15.5)
WBC: 6.8 K/uL (ref 4.0–10.5)
nRBC: 0 % (ref 0.0–0.2)

## 2023-12-20 LAB — URINALYSIS, ROUTINE W REFLEX MICROSCOPIC
Bilirubin Urine: NEGATIVE
Glucose, UA: NEGATIVE mg/dL
Hgb urine dipstick: NEGATIVE
Ketones, ur: NEGATIVE mg/dL
Leukocytes,Ua: NEGATIVE
Nitrite: NEGATIVE
Protein, ur: NEGATIVE mg/dL
Specific Gravity, Urine: 1.006 (ref 1.005–1.030)
pH: 7 (ref 5.0–8.0)

## 2023-12-20 LAB — BASIC METABOLIC PANEL WITH GFR
Anion gap: 11 (ref 5–15)
BUN: 17 mg/dL (ref 8–23)
CO2: 24 mmol/L (ref 22–32)
Calcium: 10.5 mg/dL — ABNORMAL HIGH (ref 8.9–10.3)
Chloride: 105 mmol/L (ref 98–111)
Creatinine, Ser: 0.69 mg/dL (ref 0.44–1.00)
GFR, Estimated: >60 mL/min (ref >60)
Glucose, Bld: 89 mg/dL (ref 70–99)
Potassium: 3.8 mmol/L (ref 3.5–5.1)
Sodium: 140 mmol/L (ref 135–145)

## 2023-12-20 MED ORDER — HALOPERIDOL LACTATE 5 MG/ML IJ SOLN
2.0000 mg | Freq: Once | INTRAMUSCULAR | Status: AC
  Administered 2023-12-20: 2 mg via INTRAVENOUS
  Filled 2023-12-20: qty 1

## 2023-12-20 MED ORDER — LORAZEPAM 2 MG/ML IJ SOLN
1.0000 mg | Freq: Once | INTRAMUSCULAR | Status: AC
  Administered 2023-12-20: 1 mg via INTRAVENOUS
  Filled 2023-12-20: qty 1

## 2023-12-20 MED ORDER — AMOXICILLIN-POT CLAVULANATE 875-125 MG PO TABS: 1.0000 | ORAL_TABLET | Freq: Once | ORAL | Status: DC

## 2023-12-20 NOTE — ED Triage Notes (Signed)
 Pt presents via EMS from [facility]. Staff reported pt was more altered today than her usual baseline dementia and had been sleepnig all day.  Visited by her sister earlier and staff reported they smelled ETOH.  Pt is sleeping on arrival.  VSS.  CBG WDL with EMS.  Pt did have ativan  this morning per staff.

## 2023-12-20 NOTE — ED Provider Notes (Signed)
 [place] EMERGENCY DEPARTMENT AT [facility] Provider Note   CSN: [id number] Arrival date & time: 12/20/23  1831     Patient presents with: Altered Mental Status   Peggy Ramirez is a 89 y.o. female.  With past medical history of Alzheimer's, GERD, hyperlipidemia, hypertension presenting to emergency room with complaint of altered mental status.  Patient has reportedly been sleeping all day and much more agitated than normal.  Patient was given Ativan  for this.  Currently agitated. No reported falls or fever.     Altered Mental Status      Prior to Admission medications   Medication Sig Start Date End Date Taking? Authorizing Provider  acetaminophen  (TYLENOL ) 500 MG tablet Take 2 tablets (1,000 mg total) by mouth every 6 (six) hours as needed for headache or fever. Patient taking differently: Take 500 mg by mouth in the morning, at noon, in the evening, and at bedtime. 07/24/23  Yes Angiulli, Toribio PARAS, PA-C  amLODipine  (NORVASC ) 2.5 MG tablet TAKE 1 TABLET BY MOUTH DAILY  (DOSE DECREASED) Patient taking differently: Take 2.5 mg by mouth daily. 10/06/23  Yes BranchDorn FALCON, MD  busPIRone  (BUSPAR ) 7.5 MG tablet Take 7.5 mg by mouth 3 (three) times daily. 12/10/23  Yes [provider]  Carboxymethylcellulose Sodium (REFRESH LIQUIGEL OP) Place 1 drop into both eyes in the morning, at noon, in the evening, and at bedtime. Gel drops   Yes [provider]  famotidine  (PEPCID ) 20 MG tablet Take 20 mg by mouth 2 (two) times daily. 11/07/23  Yes [provider]  LORazepam  (ATIVAN ) 0.5 MG tablet Take 0.5 mg by mouth 2 (two) times daily as needed for anxiety. 12/17/23  Yes [provider]  meloxicam (MOBIC) 7.5 MG tablet Take 7.5 mg by mouth daily.   Yes [provider]  mirtazapine (REMERON) 15 MG tablet Take 15 mg by mouth at bedtime. 2100 12/10/23  Yes [provider]  Nutritional Supplement LIQD Take 1 Dose by mouth in the  morning, at noon, and at bedtime.   Yes [provider]  polyethylene glycol (MIRALAX  / GLYCOLAX ) 17 g packet Take 17 g by mouth daily. Patient taking differently: Take 17 g by mouth every evening. 1900 07/24/23  Yes Angiulli, Toribio PARAS, PA-C  QUEtiapine  (SEROQUEL ) 25 MG tablet Take 12.5 mg by mouth at bedtime. 2100 12/14/23 12/25/23 Yes [provider]  REXULTI 0.5 MG TABS Take 1 tablet by mouth at bedtime. 2100 12/14/23  Yes [provider]  REXULTI 1 MG TABS tablet Take 1 mg by mouth at bedtime. 2100 12/14/23  Yes [provider]  senna (SENOKOT) 8.6 MG tablet Take 2 tablets by mouth 2 (two) times daily.   Yes [provider]  simvastatin  (ZOCOR ) 20 MG tablet Take 1 tablet (20 mg total) by mouth daily. Patient taking differently: Take 20 mg by mouth every evening. 1900 07/15/23  Yes Angiulli, Toribio PARAS, PA-C  traZODone  (DESYREL ) 50 MG tablet Take 75 mg by mouth at bedtime as needed for sleep. 2200   Yes [provider]  Acidophilus Lactobacillus CAPS Take 250 mg by mouth 2 (two) times daily. Patient not taking: Reported on 12/20/2023 07/24/23   Pegge Toribio PARAS, PA-C  ALPRAZolam  (XANAX ) 0.5 MG tablet Take 0.5 mg by mouth at bedtime as needed for anxiety or sleep. Patient not taking: Reported on 12/20/2023 08/13/23   [provider]  Ascorbic Acid  (VITAMIN C ) 1000 MG tablet Take 1 tablet (1,000 mg total) by  mouth daily. Patient not taking: Reported on 12/20/2023 07/15/23   Angiulli, Toribio PARAS, PA-C  docusate sodium  (COLACE) 100 MG capsule Take 1 capsule (100 mg total) by mouth 2 (two) times daily. Patient not taking: Reported on 12/20/2023 07/15/23   Angiulli, Toribio PARAS, PA-C  loratadine  (CLARITIN ) 10 MG tablet Take 10 mg by mouth daily as needed for allergies. Patient not taking: Reported on 12/20/2023    [provider]  melatonin 5 MG TABS Take 1 tablet (5 mg total) by mouth at bedtime as needed. Patient not taking: Reported on  12/20/2023 07/24/23   Angiulli, Daniel J, PA-C  Multiple Vitamin (MULTIVITAMIN) tablet Take 1 tablet by mouth daily. Patient not taking: Reported on 12/20/2023    [provider]  pantoprazole  (PROTONIX ) 40 MG tablet Take 40 mg by mouth 2 (two) times daily. Patient not taking: Reported on 12/20/2023    [provider]  QUEtiapine  (SEROQUEL ) 50 MG tablet Take 50 mg by mouth at bedtime.    [provider]  sodium chloride  1 g tablet Take 1 tablet (1 g total) by mouth 2 (two) times daily with a meal. 07/24/23   Angiulli, Toribio PARAS, PA-C    Allergies: Atorvastatin , Cellcept [mycophenolate mofetil], Medrol  [methylprednisolone ], Mycophenolate mofetil, Topiramate , Levofloxacin, Sulfonamide derivatives, and Verapamil    Review of Systems  Updated Vital Signs BP (!) 143/69   Pulse (!) 58   Temp 97.9 F (36.6 C) (Axillary)   Resp 18   SpO2 96%   Physical Exam  (all labs ordered are listed, but only abnormal results are displayed) Labs Reviewed  BASIC METABOLIC PANEL WITH GFR - Abnormal; Notable for the following components:      Result Value   Calcium  10.5 (*)    All other components within normal limits  URINALYSIS, ROUTINE W REFLEX MICROSCOPIC - Abnormal; Notable for the following components:   Color, Urine STRAW (*)    All other components within normal limits  CBC  RAPID URINE DRUG SCREEN, HOSP PERFORMED    EKG: None  Radiology: CT Head Wo Contrast Result Date: 12/20/2023 CLINICAL DATA:  Altered mental status. EXAM: CT HEAD WITHOUT CONTRAST TECHNIQUE: Contiguous axial images were obtained from the base of the skull through the vertex without intravenous contrast. RADIATION DOSE REDUCTION: This exam was performed according to the departmental dose-optimization program which includes automated exposure control, adjustment of the mA and/or kV according to patient size and/or use of iterative reconstruction technique. COMPARISON:  November 28, 2023 FINDINGS: Brain:  There is generalized cerebral atrophy with widening of the extra-axial spaces and ventricular dilatation. There are areas of decreased attenuation within the white matter tracts of the supratentorial brain, consistent with microvascular disease changes. Vascular: Chronic right middle meningeal artery embolization is noted. Skull: Normal. Negative for fracture or focal lesion. Sinuses/Orbits: No acute finding. Other: None. IMPRESSION: 1. Generalized cerebral atrophy and microvascular disease changes of the supratentorial brain. 2. No acute intracranial abnormality. Electronically Signed   By: Suzen Dials M.D.   On: 12/20/2023 20:32     Procedures   Medications Ordered in the ED  haloperidol  lactate (HALDOL ) injection 2 mg (2 mg Intravenous Given 12/20/23 1942)  LORazepam  (ATIVAN ) injection 1 mg (1 mg Intravenous Given 12/20/23 2004)                                    Medical Decision Making Amount and/or Complexity of Data Reviewed Labs:  ordered. Radiology: ordered.  Risk Prescription drug management.   This patient presents to the ED for concern of  mental status, this involves an extensive number of treatment options, and is a complaint that carries with it a high risk of complications and morbidity.  The differential diagnosis includes cranial abnormality, dehydration, electrolyte abnormality, urinary tract infection, URI dementia with behavioral disturbance   Co morbidities that complicate the patient evaluation  Alzheimer's   Lab Tests:  I personally interpreted labs.  The pertinent results include:   CBC without leukocytosis.  No anemia.  BMP without significant electrolyte abnormality.  Normal kidney function.  UA is negative.  Urine drug screen is negative.   Imaging Studies ordered:  I ordered imaging studies including head CT I independently visualized and interpreted imaging which showed no acute findings I agree with the radiologist interpretation   Cardiac  Monitoring: / EKG:  The patient was maintained on a cardiac monitor.    Problem List / ED Course / Critical interventions / Medication management  Reports emergency room with altered mental status.  On my exam she is able to follow very basic commands including squeezing my hands and moving her toes.  She is confused but no obvious slurred speech.  Pupils are equal and reactive and no facial droop.  She has no sign of bruising over her chest and abdomen.  Abdomen is soft and nondistended.  She has no significant swelling in her feet ankles.  She appears atraumatic.  Given acute change in altered mental status we will obtain head CT to rule out intracranial abnormality.  Will obtain labs, urine drug screen and UA.  Patient's vitals are stable.  She is afebrile.  She was given a new medicine, Ativan  prior to arrival which possibly is contributing to patient's altered mental status. I ordered medication including Haldol , Ativan  due to significant agitation here so we can obtain CT scan. So far lab work and urine as well as head CT have come back reassuring.  Patient signed out to Dr. Carolyn pending metabolizing Ativan .        Final diagnoses:  None    ED Discharge Orders     None          Shermon Warren LOISE DEVONNA 12/20/23 2224    Albertina Dixon, MD 12/21/23 [phone]

## 2023-12-21 DIAGNOSIS — I69891 Dysphagia following other cerebrovascular disease: Secondary | ICD-10-CM | POA: Diagnosis not present

## 2023-12-21 DIAGNOSIS — G9341 Metabolic encephalopathy: Secondary | ICD-10-CM | POA: Diagnosis not present

## 2023-12-21 DIAGNOSIS — I10 Essential (primary) hypertension: Secondary | ICD-10-CM | POA: Diagnosis not present

## 2023-12-21 DIAGNOSIS — G309 Alzheimer's disease, unspecified: Secondary | ICD-10-CM | POA: Diagnosis not present

## 2023-12-21 NOTE — ED Notes (Addendum)
 Pt attempting to get out of the bed, pt disoriented asking for [place], continuously trying to get up, 2 staff assist to get her back in the bed. Bed alarm on, pt was changed, and new pants applied. Seizure pads applied to rails due to patient trying to stick her legs through the holes in the railing.

## 2023-12-21 NOTE — ED Notes (Signed)
 Pt's family at bedside.  Prepared to take pt back to [facility] upon her becoming awake enough to sit in a wheelchair.  [facility] updated via phone report.  Pt is still sleeping soundly.

## 2023-12-23 ENCOUNTER — Ambulatory Visit: Admitting: Physical Medicine and Rehabilitation

## 2023-12-23 DIAGNOSIS — G309 Alzheimer's disease, unspecified: Secondary | ICD-10-CM | POA: Diagnosis not present

## 2023-12-23 DIAGNOSIS — I69891 Dysphagia following other cerebrovascular disease: Secondary | ICD-10-CM | POA: Diagnosis not present

## 2023-12-23 DIAGNOSIS — G9341 Metabolic encephalopathy: Secondary | ICD-10-CM | POA: Diagnosis not present

## 2023-12-25 DIAGNOSIS — I69891 Dysphagia following other cerebrovascular disease: Secondary | ICD-10-CM | POA: Diagnosis not present

## 2023-12-25 DIAGNOSIS — G9341 Metabolic encephalopathy: Secondary | ICD-10-CM | POA: Diagnosis not present

## 2023-12-25 DIAGNOSIS — G309 Alzheimer's disease, unspecified: Secondary | ICD-10-CM | POA: Diagnosis not present

## 2024-01-18 ENCOUNTER — Other Ambulatory Visit: Payer: Self-pay

## 2024-01-18 ENCOUNTER — Encounter (HOSPITAL_COMMUNITY): Payer: Self-pay | Admitting: Emergency Medicine

## 2024-01-18 ENCOUNTER — Emergency Department (HOSPITAL_COMMUNITY)
Admission: EM | Admit: 2024-01-18 | Discharge: 2024-01-18 | Disposition: A | Discharge status: Skilled Nursing Facility | Attending: Emergency Medicine | Admitting: Emergency Medicine

## 2024-01-18 ENCOUNTER — Emergency Department (HOSPITAL_COMMUNITY)

## 2024-01-18 DIAGNOSIS — S20212A Contusion of left front wall of thorax, initial encounter: Secondary | ICD-10-CM | POA: Diagnosis not present

## 2024-01-18 DIAGNOSIS — S0990XA Unspecified injury of head, initial encounter: Secondary | ICD-10-CM | POA: Diagnosis present

## 2024-01-18 DIAGNOSIS — W1809XA Striking against other object with subsequent fall, initial encounter: Secondary | ICD-10-CM | POA: Diagnosis not present

## 2024-01-18 DIAGNOSIS — W19XXXA Unspecified fall, initial encounter: Secondary | ICD-10-CM

## 2024-01-18 NOTE — ED Notes (Signed)
Report given to [place].

## 2024-01-18 NOTE — ED Notes (Signed)
 Pt transported back to facility by her family members. Helped pt get into car w/ no issues.

## 2024-01-18 NOTE — ED Triage Notes (Signed)
 Witness fall at [facility] by staff. Pt fell over from a standing position and landed inbetween nightstand and bed. NOT on blood thinners. No LOC. Hematoma to left- back of head. Hx of right tib fx. Endorses CP. Hx of dementia

## 2024-01-18 NOTE — Discharge Instructions (Addendum)
 Thankfully the CT scan of your brain and your x-ray of the chest showed no signs of trauma broken bones or bleeding.  Tylenol  or ibuprofen as needed for pain

## 2024-01-18 NOTE — ED Provider Notes (Signed)
 [place] EMERGENCY DEPARTMENT AT [facility] Provider Note   CSN: [id number] Arrival date & time: 01/18/24  8177     Patient presents with: Fall   Peggy Ramirez is a 89 y.o. female.    Fall  This patient is an 89 year old female, not anticoagulated, comes from her nursing facility where she had a mechanical fall falling from the ground level 2 between the bed and the dresser.  She did hit her head on the dresser as well as her chest on the left side on the way down.  There is was witnessed by staff, there was no loss of consciousness, she was not able to get up by herself, she does not have any pain in her arms or her legs.  She denies loss of consciousness, no nausea vomiting or seizure activity seen prehospital.     Prior to Admission medications   Medication Sig Start Date End Date Taking? Authorizing Provider  acetaminophen  (TYLENOL ) 500 MG tablet Take 2 tablets (1,000 mg total) by mouth every 6 (six) hours as needed for headache or fever. Patient taking differently: Take 500 mg by mouth in the morning, at noon, in the evening, and at bedtime. 07/24/23  Yes Angiulli, Toribio PARAS, PA-C  amLODipine  (NORVASC ) 2.5 MG tablet TAKE 1 TABLET BY MOUTH DAILY  (DOSE DECREASED) Patient taking differently: Take 2.5 mg by mouth daily. 10/06/23  Yes BranchDorn FALCON, MD  busPIRone  (BUSPAR ) 10 MG tablet Take 10 mg by mouth 3 (three) times daily. 12/10/23  Yes [provider]  Carboxymethylcellulose Sodium (REFRESH LIQUIGEL OP) Place 1 drop into both eyes in the morning, at noon, in the evening, and at bedtime. Gel drops   Yes [provider]  famotidine  (PEPCID ) 20 MG tablet Take 20 mg by mouth 2 (two) times daily. 11/07/23  Yes [provider]  meloxicam (MOBIC) 7.5 MG tablet Take 7.5 mg by mouth daily.   Yes [provider]  mirtazapine (REMERON) 15 MG tablet Take 15 mg by mouth at bedtime. 2100 12/10/23  Yes [provider]  polyethylene  glycol (MIRALAX  / GLYCOLAX ) 17 g packet Take 17 g by mouth daily. Patient taking differently: Take 17 g by mouth every evening. 1900 07/24/23  Yes Angiulli, Toribio PARAS, PA-C  REXULTI 1 MG TABS tablet Take 1 mg by mouth at bedtime. 2100 12/14/23  Yes [provider]  senna (SENOKOT) 8.6 MG tablet Take 2 tablets by mouth 2 (two) times daily.   Yes [provider]  simvastatin  (ZOCOR ) 20 MG tablet Take 1 tablet (20 mg total) by mouth daily. Patient taking differently: Take 20 mg by mouth every evening. 1900 07/15/23  Yes Angiulli, Toribio PARAS, PA-C  Suvorexant (BELSOMRA) 15 MG TABS Take 15 mg by mouth at bedtime.   Yes [provider]    Allergies: Atorvastatin , Cellcept [mycophenolate mofetil], Medrol  [methylprednisolone ], Mycophenolate mofetil, Topiramate , Levofloxacin, Sulfonamide derivatives, and Verapamil    Review of Systems  All other systems reviewed and are negative.   Updated Vital Signs BP (!) 158/77   Pulse 62   Temp 97.9 F (36.6 C) (Oral)   Resp 18   Ht 1.524 m (5')   Wt 50.6 kg   SpO2 96%   BMI 21.79 kg/m   Physical Exam Vitals and nursing note reviewed.  Constitutional:      General: She is not in acute distress.    Appearance: She is well-developed.  HENT:     Head: Normocephalic.  Comments: Hematoma to the left parieto-occipital scalp    Mouth/Throat:     Pharynx: No oropharyngeal exudate.  Eyes:     General: No scleral icterus.       Right eye: No discharge.        Left eye: No discharge.     Conjunctiva/sclera: Conjunctivae normal.     Pupils: Pupils are equal, round, and reactive to light.  Neck:     Thyroid : No thyromegaly.     Vascular: No JVD.  Cardiovascular:     Rate and Rhythm: Normal rate and regular rhythm.     Heart sounds: Normal heart sounds. No murmur heard.    No friction rub. No gallop.  Pulmonary:     Effort: Pulmonary effort is normal. No respiratory distress.     Breath sounds: Normal breath sounds. No  wheezing or rales.  Chest:     Chest wall: Tenderness present.  Abdominal:     General: Bowel sounds are normal. There is no distension.     Palpations: Abdomen is soft. There is no mass.     Tenderness: There is no abdominal tenderness.  Musculoskeletal:        General: No tenderness. Normal range of motion.     Cervical back: Normal range of motion and neck supple.     Right lower leg: No edema.     Left lower leg: No edema.  Lymphadenopathy:     Cervical: No cervical adenopathy.  Skin:    General: Skin is warm and dry.     Findings: No erythema or rash.  Neurological:     Mental Status: She is alert.     Coordination: Coordination normal.     Comments: The patient is able to move all 4 extremities to command, normal straight leg raise bilaterally, normal range of motion of the bilateral shoulders elbows wrist and hands.,  Normal facial symmetry, normal speech  Psychiatric:        Behavior: Behavior normal.     (all labs ordered are listed, but only abnormal results are displayed) Labs Reviewed - No data to display  EKG: EKG Interpretation Date/Time:  Monday January 18 2024 18:35:45 EDT Ventricular Rate:  65 PR Interval:  191 QRS Duration:  129 QT Interval:  413 QTC Calculation: 430 R Axis:   -62  Text Interpretation: Sinus rhythm Left bundle branch block since last tracing no significant change Confirmed by Cleotilde Rogue ([id number]) on 01/18/2024 8:04:58 PM  Radiology: CT Head Wo Contrast Result Date: 01/18/2024 CLINICAL DATA:  Fall EXAM: CT HEAD WITHOUT CONTRAST TECHNIQUE: Contiguous axial images were obtained from the base of the skull through the vertex without intravenous contrast. RADIATION DOSE REDUCTION: This exam was performed according to the departmental dose-optimization program which includes automated exposure control, adjustment of the mA and/or kV according to patient size and/or use of iterative reconstruction technique. COMPARISON:  12/20/2023 FINDINGS:  Brain: There is atrophy and chronic small vessel disease changes. No acute intracranial abnormality. Specifically, no hemorrhage, hydrocephalus, mass lesion, acute infarction, or significant intracranial injury. Vascular: No hyperdense vessel or unexpected calcification. Skull: No acute calvarial abnormality. Sinuses/Orbits: No acute findings Other: None IMPRESSION: Atrophy, chronic microvascular disease. No acute intracranial abnormality. Electronically Signed   By: Franky Crease M.D.   On: 01/18/2024 20:17   DG Chest Port 1 View Result Date: 01/18/2024 CLINICAL DATA:  Left side contusion.  Fall. EXAM: PORTABLE CHEST 1 VIEW COMPARISON:  11/28/2023 FINDINGS: Heart is borderline in size. Mediastinal contours within  normal limits. No confluent airspace opacity or effusion. No visible displaced rib fracture or pneumothorax. Aortic atherosclerosis. IMPRESSION: Borderline heart size.  No acute cardiopulmonary disease. No visible displaced rib fracture or pneumothorax. Electronically Signed   By: Franky Crease M.D.   On: 01/18/2024 19:12     Procedures   Medications Ordered in the ED - No data to display                                  Medical Decision Making Amount and/or Complexity of Data Reviewed Radiology: ordered.   The patient does not appear to be in distress but has had a fall with an injury to her head leaving her with a hematoma, she is elderly and likely has some encephalomalacia, will perform CT scan of the brain as well as a chest x-ray to make sure there is no chest injury.  She does have mild tenderness but no breaks in the skin.  Extremities look normal, especially the lower extremities with no signs of deformity or leg length discrepancies.  She has full range of motion of both hips with both external and internal rotation as well as flexion.   Radiology Imaging: I personally viewed the images of the ordered radiographic stu CT scan of the brain and chest x-ray without any signs of  intracranial or thoracic trauma dies and find no acute traumatic injury I agree with the radiologist interpretation as well   Patient has been resting and appears comfortable, family at the bedside, vital signs unremarkable, stable for discharge     Final diagnoses:  Fall, initial encounter  Injury of head, initial encounter  Chest wall contusion, left, initial encounter    ED Discharge Orders     None          Cleotilde Rogue, MD 01/18/24 2043

## 2024-01-26 ENCOUNTER — Other Ambulatory Visit (INDEPENDENT_AMBULATORY_CARE_PROVIDER_SITE_OTHER): Payer: Self-pay

## 2024-01-26 ENCOUNTER — Ambulatory Visit: Admitting: Orthopedic Surgery

## 2024-01-26 DIAGNOSIS — M79661 Pain in right lower leg: Secondary | ICD-10-CM

## 2024-01-27 ENCOUNTER — Encounter: Payer: Self-pay | Admitting: Orthopedic Surgery

## 2024-01-27 ENCOUNTER — Encounter: Payer: Self-pay | Admitting: Cardiology

## 2024-01-27 ENCOUNTER — Ambulatory Visit: Attending: Cardiology | Admitting: Cardiology

## 2024-01-27 VITALS — BP 142/84 | HR 69 | Ht 60.0 in | Wt 107.8 lb

## 2024-01-27 DIAGNOSIS — I10 Essential (primary) hypertension: Secondary | ICD-10-CM | POA: Diagnosis not present

## 2024-01-27 DIAGNOSIS — E782 Mixed hyperlipidemia: Secondary | ICD-10-CM | POA: Diagnosis present

## 2024-01-27 NOTE — Progress Notes (Signed)
 Clinical Summary Peggy Ramirez is a 89 y.o.female seen today for follow up of the following medical problems.      1. Chest pain - from previous clinic notes has history of chest pain and SOB, normal DSE in 11/2011 - 10/2014 Lexiscan  showed no ischemia    10/2020 echo: LVEF 60-65%, no WMAs - from last PA note 12/2020 chest pain had improved on protonix .  - no recent issues  09/2023 echo [facility]: LVEF 55-60%, grade I dd, mild AI   2. HTN - reports pcp had stop HCTZ. -compliant with her norvasc      3. CVA -  admit 09/2015 with left hand weakness. MRI showed small cortical infarct in the posterior right MCA territory.  - 30 day event monitor without significant arrhythmias     4. HLD - atorva caused muscle aches, symptoms improved after stopping about 1 month ago.  - currently on simvastatin  and zetia  10mg   - labs followed by pcp   05/2021 TC 133 TG 99 HDL 60 LDL 55   Pcp stopped zetia  - labs followed by pcp - due for repeat labs   5.Palpitations 10/2020 monitor: Rare ectopy, rare SVT up to 20 beats.  - denies recent Symptoms   6. Dementia  7. Falls - multiple falls, head bleed 07/2023 required intervention. Now off plavix .  Past Medical History:  Diagnosis Date   Acute metabolic encephalopathy    Alzheimer's dementia (HCC)    Chronic constipation    Chronic left lower quadrant pain    GERD (gastroesophageal reflux disease)    Headache(784.0)    High cholesterol    Hydrosalpinx    LEFT   Hypertension    Lupus    Membranous glomerulonephritis    followed by Dr. Tanda Catching   Ovarian fibroma    RIGHT   Pelvic adhesions    Sjogren's syndrome    Stroke (HCC)      Allergies  Allergen Reactions   Atorvastatin  Other (See Comments)    Muscle aches   Cellcept [Mycophenolate Mofetil] Other (See Comments)    Unknown    Medrol  [Methylprednisolone ] Other (See Comments)    Unknown    Mycophenolate Mofetil Other (See Comments)    Unknown    Topiramate  Other (See  Comments)    Unknown    Levofloxacin Rash    Tolerated Cipro  for UTI 06/2023   Sulfonamide Derivatives Nausea Only   Verapamil Other (See Comments)    Felt shakey     Current Outpatient Medications  Medication Sig Dispense Refill   acetaminophen  (TYLENOL ) 500 MG tablet Take 2 tablets (1,000 mg total) by mouth every 6 (six) hours as needed for headache or fever. (Patient taking differently: Take 500 mg by mouth in the morning, at noon, in the evening, and at bedtime.)     amLODipine  (NORVASC ) 2.5 MG tablet TAKE 1 TABLET BY MOUTH DAILY  (DOSE DECREASED) (Patient taking differently: Take 2.5 mg by mouth daily.) 100 tablet 1   busPIRone  (BUSPAR ) 10 MG tablet Take 10 mg by mouth 3 (three) times daily.     Carboxymethylcellulose Sodium (REFRESH LIQUIGEL OP) Place 1 drop into both eyes in the morning, at noon, in the evening, and at bedtime. Gel drops     famotidine  (PEPCID ) 20 MG tablet Take 20 mg by mouth 2 (two) times daily.     meloxicam (MOBIC) 7.5 MG tablet Take 7.5 mg by mouth daily.     mirtazapine (REMERON) 15 MG tablet Take  15 mg by mouth at bedtime. 2100     polyethylene glycol (MIRALAX  / GLYCOLAX ) 17 g packet Take 17 g by mouth daily. (Patient taking differently: Take 17 g by mouth every evening. 1900)     REXULTI 1 MG TABS tablet Take 1 mg by mouth at bedtime. 2100     senna (SENOKOT) 8.6 MG tablet Take 2 tablets by mouth 2 (two) times daily.     simvastatin  (ZOCOR ) 20 MG tablet Take 1 tablet (20 mg total) by mouth daily. (Patient taking differently: Take 20 mg by mouth every evening. 1900) 30 tablet 0   Suvorexant (BELSOMRA) 15 MG TABS Take 15 mg by mouth at bedtime.     No current facility-administered medications for this visit.     Past Surgical History:  Procedure Laterality Date   APPENDECTOMY  04/28/2000   BIOPSY  12/17/2020   Procedure: BIOPSY;  Surgeon: Cindie Carlin POUR, DO;  Location: [facility];  Service: Endoscopy;;   COLONOSCOPY WITH PROPOFOL  N/A 12/17/2020    Procedure: COLONOSCOPY WITH PROPOFOL ;  Surgeon: Cindie Carlin POUR, DO;  Location: [facility];  Service: Endoscopy;  Laterality: N/A;   EYE SURGERY Bilateral 2024   cataracts   FRACTURE SURGERY     HIP PINNING,CANNULATED Left 12/15/2019   Procedure: CANNULATED HIP PINNING;  Surgeon: Kendal Franky SQUIBB, MD;  Location: [facility] OR;  Service: Orthopedics;  Laterality: Left;   IR ANGIO EXTERNAL CAROTID SEL EXT CAROTID UNI R MOD SED  08/13/2023   IR ANGIO INTRA EXTRACRAN SEL INTERNAL CAROTID UNI R MOD SED  08/06/2023   IR ANGIOGRAM FOLLOW UP STUDY  08/13/2023   IR NEURO EACH ADD'L AFTER BASIC UNI RIGHT (MS)  08/06/2023   IR TRANSCATH/EMBOLIZ  08/06/2023   LAPAROSCOPIC CHOLECYSTECTOMY  04/28/2000   RADIOLOGY WITH ANESTHESIA N/A 08/06/2023   Procedure: RADIOLOGY WITH ANESTHESIA;  Surgeon: Lanis Pupa, MD;  Location: [facility] OR;  Service: Radiology;  Laterality: N/A;  Right middle meningeal artery embolization   TONSILLECTOMY  04/29/1939   TOTAL ABDOMINAL HYSTERECTOMY W/ BILATERAL SALPINGOOPHORECTOMY  04/29/2003   VAGINAL HYSTERECTOMY  04/29/1979     Allergies  Allergen Reactions   Atorvastatin  Other (See Comments)    Muscle aches   Cellcept [Mycophenolate Mofetil] Other (See Comments)    Unknown    Medrol  [Methylprednisolone ] Other (See Comments)    Unknown    Mycophenolate Mofetil Other (See Comments)    Unknown    Topiramate  Other (See Comments)    Unknown    Levofloxacin Rash    Tolerated Cipro  for UTI 06/2023   Sulfonamide Derivatives Nausea Only   Verapamil Other (See Comments)    Felt shakey      Family History  Problem Relation Age of Onset   Heart disease Other    Anxiety disorder Other    Depression Other      Social History Peggy Ramirez reports that she has never smoked. She has never used smokeless tobacco. Peggy Ramirez reports no history of alcohol  use.    Physical Examination Today's Vitals   01/27/24 1556  BP: (!) 142/84  Pulse: 69  SpO2: 95%  Weight: 107 lb 12.8  oz (48.9 kg)  Height: 5' (1.524 m)   Body mass index is 21.05 kg/m.  Gen: resting comfortably, no acute distress HEENT: no scleral icterus, pupils equal round and reactive, no palptable cervical adenopathy,  CV: RRR, no m/rg, no jvd Resp: Clear to auscultation bilaterally GI: abdomen is soft, non-tender, non-distended, normal bowel sounds, no hepatosplenomegaly  MSK: extremities are warm, no edema.  Skin: warm, no rash Neuro:  no focal deficits Psych: appropriate affect   Diagnostic Studies  11/2011 DSE Baseline LVEF 60-65%, negative for ischemia   10/2014 Lexiscan  MPI The study is normal. This is a low risk study. Nuclear stress EF: 60%. There was no ST segment deviation noted during stress.   09/2015 echo Study Conclusions   - Left ventricle: The cavity size was normal. There was focal basal   hypertrophy. Systolic function was normal. The estimated ejection   fraction was in the range of 55% to 60%. Wall motion was normal;   there were no regional wall motion abnormalities. Doppler   parameters are consistent with abnormal left ventricular   relaxation (grade 1 diastolic dysfunction). - Aortic valve: Mildly calcified annulus. Trileaflet; normal   thickness leaflets. Valve area (VTI): 2.18 cm^2. Valve area   (Vmax): 2.24 cm^2. - Mitral valve: There was mild regurgitation. - Atrial septum: No defect or patent foramen ovale was identified. - Technically adequate study.   09/2015 Carotid US  IMPRESSION: Mild amount of plaque at both carotid bifurcations, as above. Estimated bilateral ICA stenoses are less than 50%.   10/2020 echo IMPRESSIONS     1. Left ventricular ejection fraction, by estimation, is 60 to 65%. The  left ventricle has normal function. The left ventricle has no regional  wall motion abnormalities. There is mild left ventricular hypertrophy.  Left ventricular diastolic parameters  are indeterminate.   2. Right ventricular systolic function is normal.  The right ventricular  size is normal. There is normal pulmonary artery systolic pressure. The  estimated right ventricular systolic pressure is 21.1 mmHg.   3. There is a trivial pericardial effusion that is circumferential.   4. The mitral valve is grossly normal. No evidence of mitral valve  regurgitation.   5. The aortic valve is tricuspid. There is mild calcification of the  aortic valve. Aortic valve regurgitation is trivial.   6. The inferior vena cava is normal in size with greater than 50%  respiratory variability, suggesting right atrial pressure of 3 mmHg.    10/2020 monitor 12 day monitor Rare supraventricular ectopy in the form of isolated PACs, couplets, triplets. Rare episodes of SVT up to 20 beats Rare ventricular ectopy in the form of isolated PVCs, couplets Symptoms correlate with sinus rhythm and rare PACs/PVCs     Patch Wear Time:  12 days and 1 hours (2022-07-07T09:04:25-0400 to 2022-07-19T10:52:45-0400)   Patient had a min HR of 46 bpm, max HR of 193 bpm, and avg HR of 76 bpm. Predominant underlying rhythm was Sinus Rhythm. Bundle Amandeep Hogston Block/IVCD was present. 14 Supraventricular Tachycardia runs occurred, the run with the fastest interval lasting 6  beats with a max rate of 193 bpm, the longest lasting 20 beats with an avg rate of 110 bpm. Isolated SVEs were rare (<1.0%), SVE Couplets were rare (<1.0%), and SVE Triplets were rare (<1.0%). Isolated VEs were rare (<1.0%), VE Couplets were rare  (<1.0%), and no VE Triplets were present. Ventricular Trigeminy was present.   09/2023 echo [facility] Summary   1. The left ventricle is normal in size with mildly increased wall  thickness.   2. The left ventricular systolic function is normal, LVEF is visually  estimated at 55-60%.    3. There is grade I diastolic dysfunction (impaired relaxation).    4. There is mild aortic regurgitation.    5. The right ventricle is normal in size, with normal systolic function.  Assessment and Plan     1. HTN - reasonable control given advanced age, falls. Continue current meds    2. Hyperlipidemia -limited tolerance to statins, currently on simva 20mg . Had been on zetia  but pcp stopped - repeat lipid panel        Dorn PHEBE Ross, M.D.

## 2024-01-27 NOTE — Progress Notes (Signed)
 New Patient Visit  Assessment: Peggy Ramirez is a 89 y.o. female with the following: 1. Pain in right lower leg  Plan: Peggy Ramirez was brought to clinic from her facility due to concerns for a fractured tibia.  Repeat radiographs today do not demonstrate an obvious injury.  There is no callus formation, consistent with a healing fracture.  On physical exam, there is no bruising.  There is no redness.  Some mild point tenderness in the area of the pes anserine tendons, but once again there is no bruising or swelling in this area.  No signs of an acute injury.  Radiographs obtained in the facility are not available clinic today.  The report is not available.  I do not have a comparison.  Nonetheless, I provided reassurance.  If anything changes, she can return to clinic for repeat evaluation.  Follow-up: Return if symptoms worsen or fail to improve.  Subjective:  Chief Complaint  Patient presents with   Fracture    R tib approx     History of Present Illness: Peggy Ramirez is a 89 y.o. female who presents for evaluation of right leg pain.  Per reports, and discussions with her family, there was some complaints of right leg pain.  Radiographs were obtained at her facility, and there was concern for a fracture.  As such, she has been scheduled to come to clinic today.  She is not complaining of pain currently.  She does ambulate, but needs assistance of a walker.    Review of Systems: No fevers or chills No numbness or tingling No chest pain No shortness of breath No bowel or bladder dysfunction No GI distress No headaches   Medical History:  Past Medical History:  Diagnosis Date   Acute metabolic encephalopathy    Alzheimer's dementia (HCC)    Chronic constipation    Chronic left lower quadrant pain    GERD (gastroesophageal reflux disease)    Headache(784.0)    High cholesterol    Hydrosalpinx    LEFT   Hypertension    Lupus    Membranous glomerulonephritis     followed by Dr. Tanda Catching   Ovarian fibroma    RIGHT   Pelvic adhesions    Sjogren's syndrome    Stroke [facility])     Past Surgical History:  Procedure Laterality Date   APPENDECTOMY  04/28/2000   BIOPSY  12/17/2020   Procedure: BIOPSY;  Surgeon: Cindie Carlin POUR, DO;  Location: [facility];  Service: Endoscopy;;   COLONOSCOPY WITH PROPOFOL  N/A 12/17/2020   Procedure: COLONOSCOPY WITH PROPOFOL ;  Surgeon: Cindie Carlin POUR, DO;  Location: [facility];  Service: Endoscopy;  Laterality: N/A;   EYE SURGERY Bilateral 2024   cataracts   FRACTURE SURGERY     HIP PINNING,CANNULATED Left 12/15/2019   Procedure: CANNULATED HIP PINNING;  Surgeon: Kendal Franky SQUIBB, MD;  Location: [facility] OR;  Service: Orthopedics;  Laterality: Left;   IR ANGIO EXTERNAL CAROTID SEL EXT CAROTID UNI R MOD SED  08/13/2023   IR ANGIO INTRA EXTRACRAN SEL INTERNAL CAROTID UNI R MOD SED  08/06/2023   IR ANGIOGRAM FOLLOW UP STUDY  08/13/2023   IR NEURO EACH ADD'L AFTER BASIC UNI RIGHT (MS)  08/06/2023   IR TRANSCATH/EMBOLIZ  08/06/2023   LAPAROSCOPIC CHOLECYSTECTOMY  04/28/2000   RADIOLOGY WITH ANESTHESIA N/A 08/06/2023   Procedure: RADIOLOGY WITH ANESTHESIA;  Surgeon: Lanis Pupa, MD;  Location: [facility] OR;  Service: Radiology;  Laterality: N/A;  Right middle  meningeal artery embolization   TONSILLECTOMY  04/29/1939   TOTAL ABDOMINAL HYSTERECTOMY W/ BILATERAL SALPINGOOPHORECTOMY  04/29/2003   VAGINAL HYSTERECTOMY  04/29/1979    Family History  Problem Relation Age of Onset   Heart disease Other    Anxiety disorder Other    Depression Other    Social History   Tobacco Use   Smoking status: Never   Smokeless tobacco: Never  Vaping Use   Vaping status: Never Used  Substance Use Topics   Alcohol  use: No    Alcohol /week: 0.0 standard drinks of alcohol    Drug use: Never    Allergies  Allergen Reactions   Atorvastatin  Other (See Comments)    Muscle aches   Cellcept [Mycophenolate Mofetil] Other (See  Comments)    Unknown    Medrol  [Methylprednisolone ] Other (See Comments)    Unknown    Mycophenolate Mofetil Other (See Comments)    Unknown    Topiramate  Other (See Comments)    Unknown    Levofloxacin Rash    Tolerated Cipro  for UTI 06/2023   Sulfonamide Derivatives Nausea Only   Verapamil Other (See Comments)    Felt shakey    No outpatient medications have been marked as taking for the 01/26/24 encounter (Office Visit) with Onesimo Oneil LABOR, MD.    Objective: There were no vitals taken for this visit.  Physical Exam:  General: Alert and oriented. and No acute distress. Gait: Seated in a wheelchair  Evaluation of the right lower leg demonstrates no bruising.  No redness.  No swelling.  Mild tenderness over the Pes anserine tendons.  No tenderness or swelling about the ankle.  She has good range of motion of the right knee.  Sensation intact to the dorsum of the foot.    IMAGING: I personally ordered and reviewed the following images  X-rays of the right tibia were obtained in clinic today.  No comparison is available.  No obvious fracture.  No acute injuries.  No callus formation or signs of a healing fracture.  No bony lesions.  Impression: Negative right tibia x-ray.    New Medications:  No orders of the defined types were placed in this encounter.     Oneil LABOR Onesimo, MD  01/27/2024 11:06 PM

## 2024-01-27 NOTE — Patient Instructions (Signed)
 Medication Instructions:  Your physician recommends that you continue on your current medications as directed. Please refer to the Current Medication list given to you today.  *If you need a refill on your cardiac medications before your next appointment, please call your pharmacy*  Lab Work: Fasting Lipid Panel  If you have labs (blood work) drawn today and your tests are completely normal, you will receive your results only by: MyChart Message (if you have MyChart) OR A paper copy in the mail If you have any lab test that is abnormal or we need to change your treatment, we will call you to review the results.  Testing/Procedures: None  Follow-Up: At [facility], you and your health needs are our priority.  As part of our continuing mission to provide you with exceptional heart care, our providers are all part of one team.  This team includes your primary Cardiologist (physician) and Advanced Practice Providers or APPs (Physician Assistants and Nurse Practitioners) who all work together to provide you with the care you need, when you need it.  Your next appointment:   6 month(s)  Provider:   You may see Alvan Carrier, MD or the following Advanced Practice Provider on your designated Care Team:   Almarie Crate, NP    We recommend signing up for the patient portal called MyChart.  Sign up information is provided on this After Visit Summary.  MyChart is used to connect with patients for Virtual Visits (Telemedicine).  Patients are able to view lab/test results, encounter notes, upcoming appointments, etc.  Non-urgent messages can be sent to your provider as well.   To learn more about what you can do with MyChart, go to [email or web address].   Other Instructions

## 2024-06-30 ENCOUNTER — Ambulatory Visit: Admitting: Cardiology
